# Patient Record
Sex: Female | Born: 1965
Health system: Southern US, Community
[De-identification: ages and names within clinical notes are randomized; demographics above are authoritative.]

## PROBLEM LIST (undated history)

## (undated) DIAGNOSIS — IMO0001 Reserved for inherently not codable concepts without codable children: Secondary | ICD-10-CM

## (undated) DIAGNOSIS — M1990 Unspecified osteoarthritis, unspecified site: Secondary | ICD-10-CM

## (undated) DIAGNOSIS — E785 Hyperlipidemia, unspecified: Secondary | ICD-10-CM

## (undated) DIAGNOSIS — F32A Depression, unspecified: Secondary | ICD-10-CM

## (undated) DIAGNOSIS — G510 Bell's palsy: Secondary | ICD-10-CM

## (undated) DIAGNOSIS — T7840XA Allergy, unspecified, initial encounter: Secondary | ICD-10-CM

## (undated) DIAGNOSIS — Z5189 Encounter for other specified aftercare: Secondary | ICD-10-CM

## (undated) DIAGNOSIS — D649 Anemia, unspecified: Secondary | ICD-10-CM

## (undated) DIAGNOSIS — D509 Iron deficiency anemia, unspecified: Secondary | ICD-10-CM

## (undated) DIAGNOSIS — F419 Anxiety disorder, unspecified: Secondary | ICD-10-CM

## (undated) DIAGNOSIS — F329 Major depressive disorder, single episode, unspecified: Secondary | ICD-10-CM

## (undated) DIAGNOSIS — G500 Trigeminal neuralgia: Secondary | ICD-10-CM

## (undated) DIAGNOSIS — K909 Intestinal malabsorption, unspecified: Secondary | ICD-10-CM

## (undated) DIAGNOSIS — R51 Headache: Secondary | ICD-10-CM

## (undated) HISTORY — DX: Headache: R51

## (undated) HISTORY — DX: Unspecified osteoarthritis, unspecified site: M19.90

## (undated) HISTORY — DX: Depression, unspecified: F32.A

## (undated) HISTORY — PX: NASAL SINUS SURGERY: SHX719

## (undated) HISTORY — DX: Bell's palsy: G51.0

## (undated) HISTORY — PX: ESOPHAGOGASTRODUODENOSCOPY: SHX1529

## (undated) HISTORY — PX: MANDIBLE SURGERY: SHX707

## (undated) HISTORY — DX: Trigeminal neuralgia: G50.0

## (undated) HISTORY — DX: Allergy, unspecified, initial encounter: T78.40XA

## (undated) HISTORY — DX: Reserved for inherently not codable concepts without codable children: IMO0001

## (undated) HISTORY — DX: Hyperlipidemia, unspecified: E78.5

## (undated) HISTORY — PX: COLONOSCOPY: SHX174

## (undated) HISTORY — DX: Anemia, unspecified: D64.9

## (undated) HISTORY — DX: Iron deficiency anemia, unspecified: D50.9

## (undated) HISTORY — DX: Anxiety disorder, unspecified: F41.9

## (undated) HISTORY — DX: Intestinal malabsorption, unspecified: K90.9

## (undated) HISTORY — PX: OTHER SURGICAL HISTORY: SHX169

## (undated) HISTORY — PX: OSTEOTOMY: SHX137

## (undated) HISTORY — DX: Major depressive disorder, single episode, unspecified: F32.9

## (undated) HISTORY — DX: Encounter for other specified aftercare: Z51.89

---

## 2000-12-31 DIAGNOSIS — G500 Trigeminal neuralgia: Secondary | ICD-10-CM

## 2000-12-31 HISTORY — DX: Trigeminal neuralgia: G50.0

## 2008-10-07 ENCOUNTER — Emergency Department (HOSPITAL_BASED_OUTPATIENT_CLINIC_OR_DEPARTMENT_OTHER): Admission: EM | Admit: 2008-10-07 | Discharge: 2008-10-07 | Payer: Self-pay | Admitting: Emergency Medicine

## 2008-10-11 ENCOUNTER — Encounter: Payer: Self-pay | Admitting: Family Medicine

## 2008-10-21 ENCOUNTER — Encounter: Payer: Self-pay | Admitting: Family Medicine

## 2009-07-05 ENCOUNTER — Encounter: Payer: Self-pay | Admitting: Family Medicine

## 2009-07-05 ENCOUNTER — Ambulatory Visit: Payer: Self-pay | Admitting: Family Medicine

## 2009-07-05 ENCOUNTER — Other Ambulatory Visit: Admission: RE | Admit: 2009-07-05 | Discharge: 2009-07-05 | Payer: Self-pay | Admitting: *Deleted

## 2009-07-05 DIAGNOSIS — R51 Headache: Secondary | ICD-10-CM | POA: Insufficient documentation

## 2009-07-05 DIAGNOSIS — R519 Headache, unspecified: Secondary | ICD-10-CM | POA: Insufficient documentation

## 2009-07-05 DIAGNOSIS — E785 Hyperlipidemia, unspecified: Secondary | ICD-10-CM | POA: Insufficient documentation

## 2009-07-05 DIAGNOSIS — M1990 Unspecified osteoarthritis, unspecified site: Secondary | ICD-10-CM | POA: Insufficient documentation

## 2009-07-05 DIAGNOSIS — F32A Depression, unspecified: Secondary | ICD-10-CM | POA: Insufficient documentation

## 2009-07-05 DIAGNOSIS — F329 Major depressive disorder, single episode, unspecified: Secondary | ICD-10-CM

## 2009-07-05 DIAGNOSIS — J309 Allergic rhinitis, unspecified: Secondary | ICD-10-CM | POA: Insufficient documentation

## 2009-07-05 LAB — CONVERTED CEMR LAB: Pap Smear: NORMAL

## 2009-08-04 ENCOUNTER — Telehealth: Payer: Self-pay | Admitting: Family Medicine

## 2009-08-09 ENCOUNTER — Ambulatory Visit: Payer: Self-pay | Admitting: Family Medicine

## 2009-08-09 DIAGNOSIS — G4710 Hypersomnia, unspecified: Secondary | ICD-10-CM | POA: Insufficient documentation

## 2009-08-22 ENCOUNTER — Telehealth (INDEPENDENT_AMBULATORY_CARE_PROVIDER_SITE_OTHER): Payer: Self-pay | Admitting: *Deleted

## 2009-11-08 ENCOUNTER — Ambulatory Visit: Payer: Self-pay | Admitting: Family Medicine

## 2009-11-14 DIAGNOSIS — N259 Disorder resulting from impaired renal tubular function, unspecified: Secondary | ICD-10-CM | POA: Insufficient documentation

## 2009-11-14 LAB — CONVERTED CEMR LAB
ALT: 9 units/L (ref 0–35)
AST: 16 units/L (ref 0–37)
Albumin: 4.1 g/dL (ref 3.5–5.2)
Alkaline Phosphatase: 51 units/L (ref 39–117)
CO2: 17 meq/L — ABNORMAL LOW (ref 19–32)
Cholesterol: 187 mg/dL (ref 0–200)
Total Bilirubin: 0.3 mg/dL (ref 0.3–1.2)
Total CHOL/HDL Ratio: 2.7

## 2009-11-22 ENCOUNTER — Encounter: Payer: Self-pay | Admitting: Family Medicine

## 2009-12-01 LAB — CONVERTED CEMR LAB
Calcium: 9.6 mg/dL (ref 8.4–10.5)
Chloride: 110 meq/L (ref 96–112)
Creatinine, Ser: 1.55 mg/dL — ABNORMAL HIGH (ref 0.40–1.20)
Glucose, Bld: 83 mg/dL (ref 70–99)
Potassium: 4.2 meq/L (ref 3.5–5.3)

## 2009-12-05 ENCOUNTER — Telehealth: Payer: Self-pay | Admitting: Family Medicine

## 2009-12-21 ENCOUNTER — Telehealth (INDEPENDENT_AMBULATORY_CARE_PROVIDER_SITE_OTHER): Payer: Self-pay | Admitting: *Deleted

## 2009-12-26 ENCOUNTER — Telehealth: Payer: Self-pay | Admitting: Family Medicine

## 2009-12-29 ENCOUNTER — Ambulatory Visit: Payer: Self-pay | Admitting: Family Medicine

## 2009-12-29 LAB — CONVERTED CEMR LAB
Blood in Urine, dipstick: NEGATIVE
Glucose, Urine, Semiquant: NEGATIVE
Nitrite: NEGATIVE
Rapid Strep: NEGATIVE
Specific Gravity, Urine: >=1.03
pH: 5.5

## 2009-12-30 ENCOUNTER — Encounter: Payer: Self-pay | Admitting: Family Medicine

## 2010-01-02 LAB — CONVERTED CEMR LAB
Trich, Wet Prep: NONE SEEN
Yeast Wet Prep HPF POC: NONE SEEN

## 2010-01-04 ENCOUNTER — Telehealth: Payer: Self-pay | Admitting: Family Medicine

## 2010-01-05 ENCOUNTER — Encounter: Payer: Self-pay | Admitting: Family Medicine

## 2010-01-05 ENCOUNTER — Encounter: Admission: RE | Admit: 2010-01-05 | Discharge: 2010-01-05 | Payer: Self-pay | Admitting: Family Medicine

## 2010-01-06 LAB — CONVERTED CEMR LAB
Alkaline Phosphatase: 54 units/L (ref 39–117)
BUN: 19 mg/dL (ref 6–23)
Basophils Absolute: 0 10*3/uL (ref 0.0–0.1)
Basophils Relative: 0 % (ref 0–1)
CO2: 19 meq/L (ref 19–32)
Calcium: 8.8 mg/dL (ref 8.4–10.5)
Eosinophils Absolute: 0.3 10*3/uL (ref 0.0–0.7)
HCT: 43.7 % (ref 36.0–46.0)
Lymphs Abs: 2.2 10*3/uL (ref 0.7–4.0)
Monocytes Absolute: 0.8 10*3/uL (ref 0.1–1.0)
Platelets: 193 10*3/uL (ref 150–400)
Potassium: 4.3 meq/L (ref 3.5–5.3)
RBC: 5.27 M/uL — ABNORMAL HIGH (ref 3.87–5.11)
Sodium: 141 meq/L (ref 135–145)
Total Bilirubin: 0.3 mg/dL (ref 0.3–1.2)
Total Protein: 7 g/dL (ref 6.0–8.3)
WBC: 8.6 10*3/uL (ref 4.0–10.5)

## 2010-01-12 ENCOUNTER — Encounter: Payer: Self-pay | Admitting: Family Medicine

## 2010-01-17 ENCOUNTER — Ambulatory Visit: Payer: Self-pay | Admitting: Diagnostic Radiology

## 2010-01-17 ENCOUNTER — Ambulatory Visit (HOSPITAL_BASED_OUTPATIENT_CLINIC_OR_DEPARTMENT_OTHER): Admission: RE | Admit: 2010-01-17 | Discharge: 2010-01-17 | Payer: Self-pay | Admitting: Nephrology

## 2010-01-27 ENCOUNTER — Encounter: Payer: Self-pay | Admitting: Family Medicine

## 2010-02-15 ENCOUNTER — Ambulatory Visit (HOSPITAL_COMMUNITY): Payer: Self-pay | Admitting: Psychiatry

## 2010-02-23 ENCOUNTER — Encounter: Payer: Self-pay | Admitting: Family Medicine

## 2010-04-10 ENCOUNTER — Ambulatory Visit (HOSPITAL_COMMUNITY): Payer: Self-pay | Admitting: Psychiatry

## 2010-05-29 ENCOUNTER — Ambulatory Visit: Payer: Self-pay | Admitting: Family Medicine

## 2010-07-25 ENCOUNTER — Ambulatory Visit: Payer: Self-pay | Admitting: Family Medicine

## 2010-07-25 DIAGNOSIS — N183 Chronic kidney disease, stage 3 unspecified: Secondary | ICD-10-CM | POA: Insufficient documentation

## 2010-08-10 ENCOUNTER — Other Ambulatory Visit: Admission: RE | Admit: 2010-08-10 | Discharge: 2010-08-10 | Payer: Self-pay | Admitting: Family Medicine

## 2010-08-10 ENCOUNTER — Ambulatory Visit: Payer: Self-pay | Admitting: Family Medicine

## 2010-08-10 ENCOUNTER — Encounter: Admission: RE | Admit: 2010-08-10 | Discharge: 2010-08-10 | Payer: Self-pay | Admitting: Family Medicine

## 2010-08-10 DIAGNOSIS — R5383 Other fatigue: Secondary | ICD-10-CM

## 2010-08-10 DIAGNOSIS — R5381 Other malaise: Secondary | ICD-10-CM | POA: Insufficient documentation

## 2010-08-10 LAB — CONVERTED CEMR LAB: Pap Smear: NORMAL

## 2010-08-11 LAB — CONVERTED CEMR LAB
CO2: 20 meq/L (ref 19–32)
Chloride: 108 meq/L (ref 96–112)
Creatinine, Ser: 1.36 mg/dL — ABNORMAL HIGH (ref 0.40–1.20)
Glucose, Bld: 91 mg/dL (ref 70–99)
Iron: 52 ug/dL (ref 42–145)
Potassium: 4.5 meq/L (ref 3.5–5.3)
Sodium: 140 meq/L (ref 135–145)
TSH: 3.17 microintl units/mL (ref 0.350–4.500)

## 2010-08-15 LAB — CONVERTED CEMR LAB: Pap Smear: NEGATIVE

## 2010-08-29 ENCOUNTER — Encounter: Payer: Self-pay | Admitting: Family Medicine

## 2010-09-01 ENCOUNTER — Encounter: Payer: Self-pay | Admitting: Family Medicine

## 2010-09-05 ENCOUNTER — Encounter: Payer: Self-pay | Admitting: Family Medicine

## 2010-09-06 ENCOUNTER — Encounter: Payer: Self-pay | Admitting: Family Medicine

## 2010-09-06 ENCOUNTER — Telehealth: Payer: Self-pay | Admitting: Family Medicine

## 2010-09-26 ENCOUNTER — Encounter: Payer: Self-pay | Admitting: Family Medicine

## 2010-10-23 ENCOUNTER — Telehealth: Payer: Self-pay | Admitting: Family Medicine

## 2011-01-30 NOTE — Consult Note (Signed)
Summary: [facility]  [facility]   Imported By: Lanelle Bal 02/09/2010 12:54:16  _____________________________________________________________________  External Attachment:    Type:   Image     Comment:   External Document

## 2011-01-30 NOTE — Progress Notes (Signed)
Summary: Letter for insurance   Phone Note Call from Patient   Caller: Patient Summary of Call: Pt states she would like for you to write a letter to her insurance company stating she has a disability but still needs to work out and lose weight. Pt states she has already contacted her insurance company and they will cover a gym membership if she has a letter from her doctor.  Initial call taken by: Payton Spark CMA,  September 06, 2010 8:28 AM  Follow-up for Phone Call        sure thing. Follow-up by: Seymour Bars DO,  September 06, 2010 8:35 AM

## 2011-01-30 NOTE — Assessment & Plan Note (Signed)
Summary: sinusitis   Vital Signs:  Patient profile:   45 year old female Menstrual status:  regular Height:      64 inches Weight:      160 pounds BMI:     27.56 O2 Sat:      100 % on Room air Pulse rate:   116 / minute BP sitting:   119 / 80  (left arm) Cuff size:   regular  Vitals Entered By: Payton Spark CMA (July 25, 2010 9:12 AM)  O2 Flow:  Room air CC: f/u.   Primary Care Provider:  Seymour Bars DO  CC:  f/u.Marland Kitchen  History of Present Illness: 45 yo WF presents for  problems with sinus pressure and change in HAs x 3 wks.  Feels differnet than her 'usual' trigeminal neuralgia and migraine pain.  Hx of allergies.  Has felt congested x 3 wks with sinus pressure.  Has tried Benadryl, Sudafed, chlorpheniramine.  Nothing is helping.  Had an ear infection, treated in May.  Denies ear pain but has fullness now.  Has malaise but no fevers or chills.    She is seeing Dr Allena Katz at [facility] for stage III CKD. She is seeing Dr Samuel Bouche for pain mgmt.  On Ketamine infusions.    Current Medications (verified): 1)  Tramadol Hcl 50 Mg  Tabs (Tramadol Hcl) .... Four Times Daily 2)  Trazodone Hcl 100 Mg Tabs (Trazodone Hcl) .Marland Kitchen.. 1-2 By Mouth At Bedtime 3)  Topiramate 100 Mg Tabs (Topiramate) .... Take 3 Tabs Once Daily 4)  Verapamil Hcl Cr 120 Mg Cr-Tabs (Verapamil Hcl) .Marland Kitchen.. 1 By Mouth Once Daily 5)  Clonazepam 1 Mg Tabs (Clonazepam) .Marland Kitchen.. 1 By Mouth Two Times A Day 6)  Naproxen Sodium 220 Mg Tabs (Naproxen Sodium) .Marland Kitchen.. 1 By Mouth Once Daily 7)  Tizanidine Hcl 4 Mg Tabs (Tizanidine Hcl) .Marland Kitchen.. 1 By Mouth Four Times Daily 8)  Vimpat 100 Mg Tabs (Lacosamide) .Marland Kitchen.. 1 By Mouth Three Times A Day 9)  Zoloft 100 Mg Tabs (Sertraline Hcl) .... 1.5 Tabs By Mouth Daily 10)  Yaz 3-0.02 Mg Tabs (Drospirenone-Ethinyl Estradiol) .Marland Kitchen.. 1 By Mouth Once Daily 11)  Acetaminophen 325 Mg Tabs (Acetaminophen) .... As Needed 12)  Frova 2.5 Mg Tabs (Frovatriptan Succinate) .... Once Daily 13)  Crestor 10 Mg Tabs  (Rosuvastatin Calcium) .Marland Kitchen.. 1 Tab By Mouth Qhs 14)  Abilify 2 Mg Tabs (Aripiprazole) .Marland Kitchen.. 1 Tab By Mouth Q Pm 15)  Nasonex 50 Mcg/act Susp (Mometasone Furoate) .... 2 Sprays/ Nostril Daily  Allergies (verified): 1)  ! Methadone Hcl (Methadone Hcl) 2)  ! Phenobarbital (Phenobarbital) 3)  ! Azithromycin (Azithromycin)  Past History:  Past Medical History: Allergic rhinitis Blood transfusion Depression Headache Hyperlipidemia Osteoarthritis R facial paralysis/ chronic pain syndrome.  (pain managment Dr Kendell Bane) G0 R trigeminal neuralgia.    Past Surgical History: Reviewed history from 07/05/2009 and no changes required. osteotomy  Social History: Reviewed history from 07/05/2009 and no changes required. Lives alone. parents nearby no relationships on disability denies smoking or ETOH  Review of Systems      See HPI  Physical Exam  General:  alert, well-developed, well-nourished, and well-hydrated.   Head:  normocephalic and atraumatic.  frontal sinus TTP Eyes:  conjunctiva clear Ears:  EACs patent; TMs translucent and gray with good cone of light and bony landmarks.  Nose:  septal deviation with scant clear rhinorrhea Mouth:  pharynx pink and moist.   Neck:  no masses.   Lungs:  Normal respiratory  effort, chest expands symmetrically. Lungs are clear to auscultation, no crackles or wheezes. Heart:  regular rhythm, no murmur, and tachycardia.   Skin:  color normal and no rashes.   Cervical Nodes:  No lymphadenopathy noted   Impression & Recommendations:  Problem # 1:  ACUTE SINUSITIS, UNSPECIFIED (ICD-461.9) Will treat her with Amoxicillin x 10 days for sinusitis + sample of Astepro spray for underlying allergies. If not improved in 10 days, will get a limited CT of the sinuses to r/o chronic sinusitis.  Likely will need allergy referral in the future for testing. The following medications were removed from the medication list:    Amoxicillin 875 Mg Tabs  (Amoxicillin) ..... One by mouth two times a day Her updated medication list for this problem includes:    Nasonex 50 Mcg/act Susp (Mometasone furoate) .Marland Kitchen... 2 sprays/ nostril daily    Amoxicillin 875 Mg Tabs (Amoxicillin) .Marland Kitchen... 1 tab by mouth q 12 hrs x 10 days  Problem # 2:  CHRONIC KIDNEY DISEASE STAGE III (MODERATE) (ICD-585.3) Reviewed notes from Dr Allena Katz.  Update labs and fax copy to Dr Allena Katz.  She is due to f/u in Aug.   Orders: T-CBC w/Diff [id number] [phone]) T-Comprehensive Metabolic Panel [phone])  Complete Medication List: 1)  Tramadol Hcl 50 Mg Tabs (Tramadol hcl) .... Four times daily 2)  Trazodone Hcl 100 Mg Tabs (Trazodone hcl) .Marland Kitchen.. 1-2 by mouth at bedtime 3)  Topiramate 100 Mg Tabs (Topiramate) .... Take 3 tabs once daily 4)  Verapamil Hcl Cr 120 Mg Cr-tabs (Verapamil hcl) .Marland Kitchen.. 1 by mouth once daily 5)  Clonazepam 1 Mg Tabs (Clonazepam) .Marland Kitchen.. 1 by mouth two times a day 6)  Naproxen Sodium 220 Mg Tabs (Naproxen sodium) .Marland Kitchen.. 1 by mouth once daily 7)  Tizanidine Hcl 4 Mg Tabs (Tizanidine hcl) .Marland Kitchen.. 1 by mouth four times daily 8)  Vimpat 100 Mg Tabs (Lacosamide) .Marland Kitchen.. 1 by mouth three times a day 9)  Zoloft 100 Mg Tabs (Sertraline hcl) .... 1.5 tabs by mouth daily 10)  Yaz 3-0.02 Mg Tabs (Drospirenone-ethinyl estradiol) .Marland Kitchen.. 1 by mouth once daily 11)  Acetaminophen 325 Mg Tabs (Acetaminophen) .... As needed 12)  Frova 2.5 Mg Tabs (Frovatriptan succinate) .... Once daily 13)  Crestor 10 Mg Tabs (Rosuvastatin calcium) .Marland Kitchen.. 1 tab by mouth qhs 14)  Abilify 2 Mg Tabs (Aripiprazole) .Marland Kitchen.. 1 tab by mouth q pm 15)  Nasonex 50 Mcg/act Susp (Mometasone furoate) .... 2 sprays/ nostril daily 16)  Amoxicillin 875 Mg Tabs (Amoxicillin) .Marland Kitchen.. 1 tab by mouth q 12 hrs x 10 days  Other Orders: T-LDL Direct ([id number])  Patient Instructions: 1)  Treat sinusitis with 10 days of Amoxicillin (take with breakfast and dinner). 2)  Use RX Astepro sample - 2 sprays per nostril twice a day. 3)   Avoid OTC 'sinus meds'. 4)  Use Tylenol as needed for headache pain. 5)  Update labs today. 6)  Will call you w/ results tomorrow. 7)  Call if not improved after 10 days --> next step will be sinus CT scan.   Prescriptions: AMOXICILLIN 875 MG TABS (AMOXICILLIN) 1 tab by mouth q 12 hrs x 10 days  #20 x 0   Entered and Authorized by:   Seymour Bars DO   Signed by:   Seymour Bars DO on 07/25/2010   Method used:   Electronically to        [facility] (retail)       [address]  [address]       [place], [place]  [address]       Ph: [phone] or [phone]       Fax: [phone]   RxID:   [id number] ABILIFY 2 MG TABS (ARIPIPRAZOLE) 1 tab by mouth q PM  #30 Tablet x 6   Entered and Authorized by:   Seymour Bars DO   Signed by:   Seymour Bars DO on 07/25/2010   Method used:   Electronically to        [facility] (retail)       [address]       [place], [place]  [address]       Ph: [phone] or [phone]       Fax: [phone]   RxID:   [id number] CRESTOR 10 MG TABS (ROSUVASTATIN CALCIUM) 1 tab by mouth qhs  #90 x 1   Entered and Authorized by:   Seymour Bars DO   Signed by:   Seymour Bars DO on 07/25/2010   Method used:   Printed then faxed to ...       [facility] [address] (retail)       [address]       [place], [place]  [address]       Ph: [phone] or [phone]       Fax: [phone]   RxID:   [id number] ZOLOFT 100 MG TABS (SERTRALINE HCL) 1.5 tabs by mouth daily  #135 x 1   Entered and Authorized by:   Seymour Bars DO   Signed by:   Seymour Bars DO on 07/25/2010   Method used:   Printed then faxed to ...       [facility] [address] (retail)       [address]       [place], [place]  [address]       Ph: [phone] or [phone]       Fax: [phone]   RxID:   [id number]

## 2011-01-30 NOTE — Medication Information (Signed)
Summary: Formulary Letter Regarding Abilify & Gianvi/Silver Script  Formulary Letter Regarding Abilify & Gianvi/Silver Script   Imported By: Lanelle Bal 02/17/2010 10:12:26  _____________________________________________________________________  External Attachment:    Type:   Image     Comment:   External Document

## 2011-01-30 NOTE — Letter (Signed)
Summary: [facility]  [facility]   Imported By: Lanelle Bal 03/07/2010 13:23:56  _____________________________________________________________________  External Attachment:    Type:   Image     Comment:   External Document

## 2011-01-30 NOTE — Consult Note (Signed)
Summary: Eli Hose MD  Zoe Walker Kehr MD   Imported By: Sherian Rein 09/16/2010 11:12:34  _____________________________________________________________________  External Attachment:    Type:   Image     Comment:   External Document

## 2011-01-30 NOTE — Letter (Signed)
Summary: [facility]  [facility]   Imported By: Lanelle Bal 10/05/2010 09:41:19  _____________________________________________________________________  External Attachment:    Type:   Image     Comment:   External Document

## 2011-01-30 NOTE — Assessment & Plan Note (Signed)
Summary: L ear pain x 1 dy rm 3   Vital Signs:  Patient Profile:   45 Years Old Female CC:      L Ear pain x 1 dy Height:     64 inches Weight:      163 pounds O2 Sat:      100 % O2 treatment:    Room Air Temp:     97.4 degrees F oral Pulse rate:   104 / minute Pulse rhythm:   regular Resp:     18 per minute BP sitting:   118 / 89  (right arm) Cuff size:   regular  Vitals Entered By: Areta Haber CMA (May 29, 2010 1:32 PM)                  Current Allergies: ! METHADONE HCL (METHADONE HCL) ! PHENOBARBITAL (PHENOBARBITAL) ! AZITHROMYCIN (AZITHROMYCIN)     History of Present Illness Chief Complaint: L Ear pain x 1 dy History of Present Illness: Subjective:  Patient complains of onset of left ear pain yesterday.  She has chronic right ear and head pain, and left ear pain is completely different.  She is concerned that she may have an ear ifection.  She has had increased sinus congestion for about 2 weeks.  No fevers, chills, and sweats   Current Problems: LOM (ICD-382.9) ABDOMINAL PAIN, GENERALIZED (ICD-789.07) VIRAL EXANTHEM, ACUTE (ICD-057.9) VAGINITIS (ICD-616.10) RENAL INSUFFICIENCY (ICD-588.9) OTHER SCREENING MAMMOGRAM (ICD-V76.12) OTH&UNSPEC ENDOCRN NUTRIT METAB&IMMUNITY D/O (ICD-V77.99) HYPERSOMNIA (ICD-780.54) ACUTE MAXILLARY SINUSITIS (ICD-461.0) ROUTINE GYNECOLOGICAL EXAMINATION (ICD-V72.31) OSTEOARTHRITIS (ICD-715.90) HYPERLIPIDEMIA (ICD-272.4) HEADACHE (ICD-784.0) DEPRESSION (ICD-311) ALLERGIC RHINITIS (ICD-477.9)   Current Meds TRAMADOL HCL 50 MG  TABS (TRAMADOL HCL) four times daily TRAZODONE HCL 100 MG TABS (TRAZODONE HCL) 1-2 by mouth at bedtime TOPIRAMATE 100 MG TABS (TOPIRAMATE) take 3 tabs once daily VERAPAMIL HCL CR 120 MG CR-TABS (VERAPAMIL HCL) 1 by mouth once daily CLONAZEPAM 1 MG TABS (CLONAZEPAM) 1 by mouth two times a day NAPROXEN SODIUM 220 MG TABS (NAPROXEN SODIUM) 1 by mouth once daily TIZANIDINE HCL 4 MG TABS (TIZANIDINE  HCL) 1 by mouth four times daily VIMPAT 100 MG TABS (LACOSAMIDE) 1 by mouth three times a day ZOLOFT 100 MG TABS (SERTRALINE HCL) 1.5 tabs by mouth daily YAZ 3-0.02 MG TABS (DROSPIRENONE-ETHINYL ESTRADIOL) 1 by mouth once daily ACETAMINOPHEN 325 MG TABS (ACETAMINOPHEN) as needed FROVA 2.5 MG TABS (FROVATRIPTAN SUCCINATE) once daily CRESTOR 10 MG TABS (ROSUVASTATIN CALCIUM) 1 tab by mouth qhs ABILIFY 2 MG TABS (ARIPIPRAZOLE) 1 tab by mouth q PM NASONEX 50 MCG/ACT SUSP (MOMETASONE FUROATE) 2 sprays/ nostril daily NYSTATIN-TRIAMCINOLONE 100000-0.1 UNIT/GM-% OINT (NYSTATIN-TRIAMCINOLONE) apply to external genitalia two times a day x 10 days AMOXICILLIN 875 MG TABS (AMOXICILLIN) One by mouth two times a day ANTIPYRINE-BENZOCAINE 5.4-1.4 % SOLN (BENZOCAINE-ANTIPYRINE) 2 to 4 gtts in affected ear three times a day for 2 to 3 days  REVIEW OF SYSTEMS Constitutional Symptoms      Denies fever, chills, night sweats, weight loss, weight gain, and fatigue.  Eyes       Denies change in vision, eye pain, eye discharge, glasses, contact lenses, and eye surgery. Ear/Nose/Throat/Mouth       Complains of change in hearing and ear pain.      Denies hearing loss/aids, ear discharge, dizziness, frequent runny nose, frequent nose bleeds, sinus problems, sore throat, hoarseness, and tooth pain or bleeding.      Comments: x 1 dy Respiratory       Denies dry cough, productive  cough, wheezing, shortness of breath, asthma, bronchitis, and emphysema/COPD.  Cardiovascular       Denies murmurs, chest pain, and tires easily with exhertion.    Gastrointestinal       Denies stomach pain, nausea/vomiting, diarrhea, constipation, blood in bowel movements, and indigestion. Genitourniary       Denies painful urination, kidney stones, and loss of urinary control. Neurological       Denies paralysis, seizures, and fainting/blackouts. Musculoskeletal       Denies muscle pain, joint pain, joint stiffness, decreased range of  motion, redness, swelling, muscle weakness, and gout.  Skin       Denies bruising, unusual mles/lumps or sores, and hair/skin or nail changes.  Psych       Denies mood changes, temper/anger issues, anxiety/stress, speech problems, depression, and sleep problems. Other Comments: Pt has not seen PCP for this.   Past History:  Past Medical History: Last updated: 07/05/2009 Allergic rhinitis Blood transfusion Depression Headache Hyperlipidemia Osteoarthritis R facial paralysis/ chronic pain syndrome.  (pain managment Dr Kendell Bane) G0  Past Surgical History: Last updated: 07/05/2009 osteotomy  Family History: Last updated: 07/05/2009 father: healthy Mother: healthy sister healthy  Social History: Last updated: 07/05/2009 Lives alone. parents nearby no relationships on disability denies smoking or ETOH   Objective:  No acute distress  Eyes:  Pupils are reactive Ears:  Canals normal.  Right tympanic membrane normal.  Left tympanic membrane red and bulging. Nose:   Septum deviates left.  There is increased turbinate congestion.  Left maxillary sinus tenderness present. Pharynx:  Normal  Neck:  Supple.  No adenopathy is present.   Assessment New Problems: LOM (ICD-382.9)   Plan New Medications/Changes: ANTIPYRINE-BENZOCAINE 5.4-1.4 % SOLN (BENZOCAINE-ANTIPYRINE) 2 to 4 gtts in affected ear three times a day for 2 to 3 days  #10cc x 0, 05/29/2010, Donna Christen MD AMOXICILLIN 875 MG TABS (AMOXICILLIN) One by mouth two times a day  #20 x 0, 05/29/2010, Donna Christen MD  New Orders: New Patient Level III [phone] Planning Comments:   Instilled Auralgan drops left ear.  Given Rx for Auralgan:  use for 2 to 4 days. Begin amoxicillin for 10 days.   May use topical decongestant and saline nasal spray. Follow-up with PCP if not improving.   The patient and/or caregiver has been counseled thoroughly with regard to medications prescribed including dosage, schedule,  interactions, rationale for use, and possible side effects and they verbalize understanding.  Diagnoses and expected course of recovery discussed and will return if not improved as expected or if the condition worsens. Patient and/or caregiver verbalized understanding.  Prescriptions: ANTIPYRINE-BENZOCAINE 5.4-1.4 % SOLN (BENZOCAINE-ANTIPYRINE) 2 to 4 gtts in affected ear three times a day for 2 to 3 days  #10cc x 0   Entered and Authorized by:   Donna Christen MD   Signed by:   Donna Christen MD on 05/29/2010   Method used:   Print then Give to Patient   RxID:   [id number] AMOXICILLIN 875 MG TABS (AMOXICILLIN) One by mouth two times a day  #20 x 0   Entered and Authorized by:   Donna Christen MD   Signed by:   Donna Christen MD on 05/29/2010   Method used:   Print then Give to Patient   RxID:   [id number]    Orders Added: 1)  New Patient Level III [43[address]] Administered four drops in left ear. Pt. tolerated well. Antipyrine and Benzocaine Otic Solution, Lot # [id number], exp., 2/12. Joanne Chars  CMA  May 29, 2010 3:07 PM

## 2011-01-30 NOTE — Consult Note (Signed)
Summary: [facility]  [facility]   Imported By: Lanelle Bal 09/07/2010 09:21:04  _____________________________________________________________________  External Attachment:    Type:   Image     Comment:   External Document

## 2011-01-30 NOTE — Progress Notes (Signed)
Summary: Abd pain  Phone Note Call from Patient   Caller: Patient Summary of Call: Pt states her abd pain is getting worse and radiates to neck and down leg. She states it is a cramping pain. What would you like Pt to do?  Please advise.  Initial call taken by: Payton Spark CMA,  January 04, 2010 1:52 PM  Follow-up for Phone Call        lets get some labs and an Acute abdominal series of xrays.  Have her f/u with me on Friday. Follow-up by: Seymour Bars DO,  January 04, 2010 2:08 PM  New Problems: ABDOMINAL PAIN, GENERALIZED (ICD-789.07)   New Problems: ABDOMINAL PAIN, GENERALIZED (ICD-789.07)  Appended Document: Abd pain Pt aware. Pt will come tomorrow.

## 2011-01-30 NOTE — Progress Notes (Signed)
Summary: Records  Phone Note Call from Patient Call back at Fax # [phone]   Caller: Desiree Wilson/ [employer] Call For: Desiree Bars DO Summary of Call: Wrote letter of medical necessity and this was denied by her insurance and they need infor regarding to her needing a gym membership for medical reasons. They need records showing medical conditions that would warrant them paying for the membership. Call back number  [phone] Initial call taken by: Kathlene November LPN,  October 23, 2010 11:11 AM  Follow-up for Phone Call        I did already.  medical conditions were listed on the letter.  I don't have anything else to add. Follow-up by: Desiree Bars DO,  October 23, 2010 11:14 AM     Appended Document: Records Notified rep of MD instructions. KJ LPN

## 2011-01-30 NOTE — Letter (Signed)
[facility]   Imported By: Lanelle Bal 09/25/2010 12:00:10  _____________________________________________________________________  External Attachment:    Type:   Image     Comment:   External Document

## 2011-01-30 NOTE — Letter (Signed)
Summary: Generic Letter  [facility] [place]  [address], [address]   [place], [place] [address]   Phone: [phone]  Fax: [phone]    09/06/2010  SYRAI GLADWIN [address] [place], [place]  [address]  To Whom It May Concern,  Ms. Diani Jillson is a primary care patient of mine with chronic stable medical problems that have lead to disability.  It is medically recommended that she begin an exercise program due to hyperlipidemia, being overweight (BMI 28) and osteoarthritis.      Sincerely,    Seymour Bars DO

## 2011-01-30 NOTE — Assessment & Plan Note (Signed)
Summary: CPE with pap   Vital Signs:  Patient profile:   45 year old female Menstrual status:  regular Height:      64 inches Weight:      163 pounds BMI:     28.08 O2 Sat:      98 % on Room air Pulse rate:   92 / minute BP sitting:   108 / 75  (left arm) Cuff size:   regular  Vitals Entered By: Payton Spark CMA (August 10, 2010 8:28 AM)  O2 Flow:  Room air CC: CPE w/ pap   Primary Care Provider:  Seymour Bars DO  CC:  CPE w/ pap.  History of Present Illness: 45 yo WF presents for a CPE with pap smear.    She is nulligravid, premenopausal.  She is on YAZ for birth control.  She is not in any relationships.  She is still having head congestion and sinus pressure even after treatment withAmoxicillin.  She does not have an ENT or an allergist.  She feels very tired.  She had many facial reconstructive surgeries.  She did not have OSA when tested in the past.    She denies a fam hx of premature heart dz, colon or breast cancer.  Her mammogram is due.  Her tetanus vaccine is due.    Current Medications (verified): 1)  Tramadol Hcl 50 Mg  Tabs (Tramadol Hcl) .... Four Times Daily 2)  Trazodone Hcl 100 Mg Tabs (Trazodone Hcl) .Marland Kitchen.. 1-2 By Mouth At Bedtime 3)  Topiramate 100 Mg Tabs (Topiramate) .... Take 3 Tabs Once Daily 4)  Verapamil Hcl Cr 120 Mg Cr-Tabs (Verapamil Hcl) .Marland Kitchen.. 1 By Mouth Once Daily 5)  Clonazepam 1 Mg Tabs (Clonazepam) .Marland Kitchen.. 1 By Mouth Two Times A Day 6)  Naproxen Sodium 220 Mg Tabs (Naproxen Sodium) .Marland Kitchen.. 1 By Mouth Once Daily 7)  Tizanidine Hcl 4 Mg Tabs (Tizanidine Hcl) .Marland Kitchen.. 1 By Mouth Four Times Daily 8)  Vimpat 100 Mg Tabs (Lacosamide) .Marland Kitchen.. 1 By Mouth Three Times A Day 9)  Zoloft 100 Mg Tabs (Sertraline Hcl) .... 1.5 Tabs By Mouth Daily 10)  Yaz 3-0.02 Mg Tabs (Drospirenone-Ethinyl Estradiol) .Marland Kitchen.. 1 By Mouth Once Daily 11)  Acetaminophen 325 Mg Tabs (Acetaminophen) .... As Needed 12)  Frova 2.5 Mg Tabs (Frovatriptan Succinate) .... Once Daily 13)  Crestor  10 Mg Tabs (Rosuvastatin Calcium) .Marland Kitchen.. 1 Tab By Mouth Qhs 14)  Abilify 2 Mg Tabs (Aripiprazole) .Marland Kitchen.. 1 Tab By Mouth Q Pm 15)  Astepro 0.15 % Soln (Azelastine Hcl) .... 2 Sprays Per Nostril Two Times A Day As Needed 16)  Amoxicillin 875 Mg Tabs (Amoxicillin) .Marland Kitchen.. 1 Tab By Mouth Q 12 Hrs X 10 Days  Allergies (verified): 1)  ! Methadone Hcl (Methadone Hcl) 2)  ! Phenobarbital (Phenobarbital) 3)  ! Azithromycin (Azithromycin)  Past History:  Past Medical History: Reviewed history from 07/25/2010 and no changes required. Allergic rhinitis Blood transfusion Depression Headache Hyperlipidemia Osteoarthritis R facial paralysis/ chronic pain syndrome.  (pain managment Dr Kendell Bane) G0 R trigeminal neuralgia.    Past Surgical History: Reviewed history from 07/05/2009 and no changes required. osteotomy  Family History: Reviewed history from 07/05/2009 and no changes required. father: healthy Mother: healthy sister healthy  Social History: Reviewed history from 07/05/2009 and no changes required. Lives alone. parents nearby no relationships on disability denies smoking or ETOH  Review of Systems  The patient denies anorexia, fever, weight loss, weight gain, vision loss, decreased hearing, hoarseness, chest pain, syncope,  dyspnea on exertion, peripheral edema, prolonged cough, headaches, hemoptysis, abdominal pain, melena, hematochezia, severe indigestion/heartburn, hematuria, incontinence, genital sores, muscle weakness, suspicious skin lesions, transient blindness, difficulty walking, depression, unusual weight change, abnormal bleeding, enlarged lymph nodes, angioedema, breast masses, and testicular masses.    Physical Exam  General:  alert, well-developed, well-nourished, well-hydrated, and overweight-appearing.   Head:  postop changes to R temporal/ maxillary region with R maxillary TTP Eyes:  conjunctiva clear Ears:  EACs patent; TMs translucent and gray with good cone of  light and bony landmarks.  Nose:  no nasal discharge.  septal deviation Mouth:  pharynx pink and moist and fair dentition.   Neck:  no masses.   Breasts:  No mass, nodules, thickening, tenderness, bulging, retraction, inflamation, nipple discharge or skin changes noted.   Lungs:  Normal respiratory effort, chest expands symmetrically. Lungs are clear to auscultation, no crackles or wheezes. Heart:  Normal rate and regular rhythm. S1 and S2 normal without gallop, murmur, click, rub or other extra sounds. Abdomen:  soft, non-tender, normal bowel sounds, no distention, no masses, no guarding, no hepatomegaly, and no splenomegaly.   Genitalia:  Pelvic Exam:        External: normal female genitalia without lesions or masses        Vagina: normal without lesions or masses, white thick discharge        Cervix: normal without lesions or masses        Adnexa: normal bimanual exam without masses or fullness        Uterus: normal by palpation        Pap smear: performed Pulses:  2+ radial and pedal pulses Extremities:  no LE edema Skin:  color normal.   Cervical Nodes:  No lymphadenopathy noted Psych:  good eye contact, not anxious appearing, and not depressed appearing.     Impression & Recommendations:  Problem # 1:  ROUTINE GYNECOLOGICAL EXAMINATION (ICD-V72.31) Thin prep pap done. Keeping healthy checklist for women reviewed. BP at goal.  BMI in overwt range. Work on [employer], increased exercise, MVI daily, calcium with D daily. Update labs for continued c/o fatigue, CKD. Ordered mammogram. Tdap today. CT sinuses for continued sinus complaints. Derm referral made for fam hx of skin cancer.  Complete Medication List: 1)  Tramadol Hcl 50 Mg Tabs (Tramadol hcl) .... Four times daily 2)  Trazodone Hcl 100 Mg Tabs (Trazodone hcl) .Marland Kitchen.. 1-2 by mouth at bedtime 3)  Topiramate 100 Mg Tabs (Topiramate) .... Take 3 tabs once daily 4)  Verapamil Hcl Cr 120 Mg Cr-tabs (Verapamil hcl) .Marland Kitchen.. 1  by mouth once daily 5)  Clonazepam 1 Mg Tabs (Clonazepam) .Marland Kitchen.. 1 by mouth two times a day 6)  Naproxen Sodium 220 Mg Tabs (Naproxen sodium) .Marland Kitchen.. 1 by mouth once daily 7)  Tizanidine Hcl 4 Mg Tabs (Tizanidine hcl) .Marland Kitchen.. 1 by mouth four times daily 8)  Vimpat 100 Mg Tabs (Lacosamide) .Marland Kitchen.. 1 by mouth three times a day 9)  Zoloft 100 Mg Tabs (Sertraline hcl) .... 1.5 tabs by mouth daily 10)  Yaz 3-0.02 Mg Tabs (Drospirenone-ethinyl estradiol) .Marland Kitchen.. 1 by mouth once daily 11)  Acetaminophen 325 Mg Tabs (Acetaminophen) .... As needed 12)  Frova 2.5 Mg Tabs (Frovatriptan succinate) .... Once daily 13)  Crestor 10 Mg Tabs (Rosuvastatin calcium) .Marland Kitchen.. 1 tab by mouth qhs 14)  Abilify 2 Mg Tabs (Aripiprazole) .Marland Kitchen.. 1 tab by mouth q pm 15)  Astepro 0.15 % Soln (Azelastine hcl) .... 2 sprays per nostril two  times a day as needed 16)  Fluconazole 150 Mg Tabs (Fluconazole) .Marland Kitchen.. 1 tab by mouth x 1  Other Orders: T-TSH [id number]) T-Basic Metabolic Panel [phone]) T-Iron [phone]) T-Iron Binding Capacity (TIBC) ([id number]) T-Mammography Bilateral Screening (32355) Dermatology Referral (Derma) T-CT Maxillofacial Limited w/o [id number]) Tdap => 2yrs IM (25427) Admin 1st Vaccine (06237) Admin 1st Vaccine [facility]) [phone])  Patient Instructions: 1)  CT sinuses ordered. 2)  Will call you w/ results. 3)  Update your mammogram. 4)  Will get you into Dr Vernell Barrier for a skin check. 5)  Return for follow up in 6 mos, sooner if needed. Prescriptions: FLUCONAZOLE 150 MG TABS (FLUCONAZOLE) 1 tab by mouth x 1  #1 x 0   Entered and Authorized by:   Seymour Bars DO   Signed by:   Seymour Bars DO on 08/10/2010   Method used:   Electronically to        [facility] (retail)       [address]       [place], [place]  [address]       Ph: [phone] or [phone]       Fax: [phone]   RxID:   [id number] YAZ 3-0.02 MG TABS (DROSPIRENONE-ETHINYL ESTRADIOL) 1 by mouth  once daily  #28 Tablet x 12   Entered and Authorized by:   Seymour Bars DO   Signed by:   Seymour Bars DO on 08/10/2010   Method used:   Electronically to        [facility] (retail)       [address]       [place], [place]  [address]       Ph: [phone] or [phone]       Fax: [phone]   RxID:   [id number]    Tetanus/Td Vaccine    Vaccine Type: Tdap    Site: left deltoid    Dose: 0.5 ml    Route: IM    Given by: Payton Spark CMA    Exp. Date: 03/24/2012    Lot #: [id number]    VIS given: 11/18/07 version given August 10, 2010.

## 2011-02-06 ENCOUNTER — Emergency Department (INDEPENDENT_AMBULATORY_CARE_PROVIDER_SITE_OTHER): Payer: No Typology Code available for payment source

## 2011-02-06 ENCOUNTER — Emergency Department (HOSPITAL_BASED_OUTPATIENT_CLINIC_OR_DEPARTMENT_OTHER)
Admission: EM | Admit: 2011-02-06 | Discharge: 2011-02-06 | Disposition: A | Discharge status: Nursing Home (Includes ICF) | Payer: No Typology Code available for payment source | Source: Home / Self Care | Attending: Emergency Medicine | Admitting: Emergency Medicine

## 2011-02-06 ENCOUNTER — Inpatient Hospital Stay (HOSPITAL_COMMUNITY)
Admission: AD | Admit: 2011-02-06 | Discharge: 2011-02-07 | DRG: 074 | Disposition: A | Discharge status: Home / Self Care | Payer: No Typology Code available for payment source | Source: Other Acute Inpatient Hospital | Attending: Family Medicine | Admitting: Family Medicine

## 2011-02-06 DIAGNOSIS — M5382 Other specified dorsopathies, cervical region: Secondary | ICD-10-CM

## 2011-02-06 DIAGNOSIS — E872 Acidosis, unspecified: Secondary | ICD-10-CM | POA: Diagnosis present

## 2011-02-06 DIAGNOSIS — M79609 Pain in unspecified limb: Secondary | ICD-10-CM

## 2011-02-06 DIAGNOSIS — M542 Cervicalgia: Secondary | ICD-10-CM | POA: Diagnosis present

## 2011-02-06 DIAGNOSIS — G500 Trigeminal neuralgia: Secondary | ICD-10-CM | POA: Diagnosis present

## 2011-02-06 DIAGNOSIS — M5412 Radiculopathy, cervical region: Principal | ICD-10-CM | POA: Diagnosis present

## 2011-02-06 DIAGNOSIS — R209 Unspecified disturbances of skin sensation: Secondary | ICD-10-CM

## 2011-02-06 DIAGNOSIS — Z66 Do not resuscitate: Secondary | ICD-10-CM | POA: Diagnosis present

## 2011-02-06 DIAGNOSIS — D696 Thrombocytopenia, unspecified: Secondary | ICD-10-CM | POA: Diagnosis present

## 2011-02-06 DIAGNOSIS — R5381 Other malaise: Secondary | ICD-10-CM | POA: Diagnosis present

## 2011-02-06 LAB — DIFFERENTIAL
Basophils Relative: 0 % (ref 0–1)
Eosinophils Absolute: 0.3 10*3/uL (ref 0.0–0.7)
Eosinophils Relative: 3 % (ref 0–5)
Monocytes Relative: 6 % (ref 3–12)

## 2011-02-06 LAB — BASIC METABOLIC PANEL
BUN: 19 mg/dL (ref 6–23)
CO2: 16 mEq/L — ABNORMAL LOW (ref 19–32)
Chloride: 115 mEq/L — ABNORMAL HIGH (ref 96–112)
Creatinine, Ser: 1.6 mg/dL — ABNORMAL HIGH (ref 0.4–1.2)
GFR calc Af Amer: 42 mL/min — ABNORMAL LOW (ref >60)
GFR calc non Af Amer: 35 mL/min — ABNORMAL LOW (ref >60)
Sodium: 142 mEq/L (ref 135–145)

## 2011-02-06 LAB — CBC
HCT: 39 % (ref 36.0–46.0)
Hemoglobin: 12.7 g/dL (ref 12.0–15.0)
MCV: 78 fL (ref 78.0–100.0)
RDW: 14.7 % (ref 11.5–15.5)

## 2011-02-07 ENCOUNTER — Inpatient Hospital Stay (HOSPITAL_COMMUNITY): Payer: No Typology Code available for payment source

## 2011-02-07 ENCOUNTER — Other Ambulatory Visit (HOSPITAL_COMMUNITY): Payer: PRIVATE HEALTH INSURANCE

## 2011-02-07 LAB — URINALYSIS, DIPSTICK ONLY
Leukocytes, UA: NEGATIVE
Specific Gravity, Urine: 1.018 (ref 1.005–1.030)
Urine Glucose, Fasting: NEGATIVE mg/dL

## 2011-02-07 LAB — CBC
HCT: 38.8 % (ref 36.0–46.0)
MCH: 25.7 pg — ABNORMAL LOW (ref 26.0–34.0)
MCHC: 32.7 g/dL (ref 30.0–36.0)
MCV: 78.4 fL (ref 78.0–100.0)
RBC: 4.95 MIL/uL (ref 3.87–5.11)
RDW: 14.4 % (ref 11.5–15.5)

## 2011-02-07 LAB — COMPREHENSIVE METABOLIC PANEL
ALT: 12 U/L (ref 0–35)
Alkaline Phosphatase: 64 U/L (ref 39–117)
BUN: 18 mg/dL (ref 6–23)
GFR calc Af Amer: 42 mL/min — ABNORMAL LOW (ref >60)
GFR calc non Af Amer: 35 mL/min — ABNORMAL LOW (ref >60)

## 2011-02-07 LAB — RAPID URINE DRUG SCREEN, HOSP PERFORMED
Amphetamines: NOT DETECTED
Barbiturates: NOT DETECTED
Benzodiazepines: POSITIVE — AB
Opiates: NOT DETECTED

## 2011-02-07 LAB — POCT I-STAT 3, ART BLOOD GAS (G3+)
O2 Saturation: 94 %
pCO2 arterial: 30.9 mmHg — ABNORMAL LOW (ref 35.0–45.0)
pH, Arterial: 7.354 (ref 7.350–7.400)

## 2011-02-21 ENCOUNTER — Telehealth (INDEPENDENT_AMBULATORY_CARE_PROVIDER_SITE_OTHER): Payer: Self-pay | Admitting: *Deleted

## 2011-02-27 NOTE — Progress Notes (Signed)
  Phone Note Call from Patient   Caller: Patient Call For: Seymour Bars DO Summary of Call: pt called and requested a refill of Crestor and Zoloft sent to [facility]. Pt has not been seen since 8/11 Initial call taken by: Avon Gully CMA, Duncan Dull),  February 21, 2011 4:26 PM    Prescriptions: CRESTOR 10 MG TABS (ROSUVASTATIN CALCIUM) 1 tab by mouth qhs  #90 x 1   Entered by:   Payton Spark CMA   Authorized by:   Seymour Bars DO   Signed by:   Payton Spark CMA on 02/21/2011   Method used:   Faxed to ...       [facility] [address] (mail-order)       [address] [place], [place]  [address]       Ph: [phone]       Fax: [phone]   RxID:   [id number] ZOLOFT 100 MG TABS (SERTRALINE HCL) 1.5 tabs by mouth daily  #135 x 1   Entered by:   Payton Spark CMA   Authorized by:   Seymour Bars DO   Signed by:   Payton Spark CMA on 02/21/2011   Method used:   Faxed to ...       [facility] [address] (mail-order)       [address] [place], [place]  [address]       Ph: [phone]       Fax: [phone]   RxID:   [id number]

## 2011-02-27 NOTE — Discharge Summary (Addendum)
NAME:  Desiree Wilson, Desiree Wilson NO.:  [id number]  MEDICAL RECORD NO.:  [id number]           PATIENT TYPE:  E  LOCATION:  [facility]                         [facility]AC[facility]  PHYSICIAN:  Nestor Ramp, MD        DATE OF BIRTH:  September 12, 1966  DATE OF ADMISSION:  02/06/2011 DATE OF DISCHARGE:  02/07/2011                              DISCHARGE SUMMARY   PRIMARY CARE PROVIDER:  Seymour Bars, DO at [facility].  DISCHARGE DIAGNOSES: 1. Chronic renal insufficiency. 2. Right trigeminal nerve neuralgia. 3. Temporalis nerve neuralgia. 4. Right ear deafness. 5. Hyperlipidemia. 6. Depression. 7. Anxiety. 8. Migraine headaches.  DISCHARGE MEDICATIONS: 1. Abilify 2 mg by mouth every morning. 2. Butrans 5 mcg per hour half transdermally weekly. 3. Clonazepam 1 mg by mouth twice daily as needed for anxiety. 4. Crestor 10 mg by mouth daily at bedtime. 5. Excedrin 2 tablets by mouth twice daily as needed for pain or     headaches. 6. Frova 2.5 mg by mouth daily as needed for severe migraines. 7. GenTeal 2 drops in both eyes twice daily as needed for dry eyes. 8. Phenergan 25 mg by mouth every 4 hours as needed for nausea with     Frova. 9. Tizanidine 4 mg by mouth 4 times daily as needed for pain. 10.Topiramate 300 mg by mouth daily at bedtime. 11.Tramadol 50-100 mg by mouth twice daily. 12.Trazodone 100-200 mg by mouth daily at bedtime. 13.Verapamil Sustained Release 120 mg by mouth daily at bedtime. 14.Vimpat 100 mg in the morning, 200 mg at night. 15.Yaz 1 tablet by mouth daily at bedtime. 16.Zoloft 150 mg by mouth every morning.  CONSULTS:  None.  PROCEDURES AND IMAGING:  Shoulder and C-spine x-rays performed February 07, 2011, demonstrated no acute abnormalities or degenerative changes.  LABORATORY DATA:  Complete metabolic panel was notable for a creatinine of 1.62 giving a GFR of 35.  Albumin was also slightly low at 3.2.  CBC was relatively unremarkable  except for a platelet count of 138. Urinalysis and creatine kinase were within normal limits.  BRIEF HOSPITAL COURSE:  This is a 45 year old female who presented to the emergency department with right shoulder and neck pain and difficulty concentrating and focusing.. 1. Arm, shoulder, and neck pain:  The patient was noted to have     significant musculoskeletal tenderness on exam at the time of     admission.  X-rays were performed to assure no acute process was     involved in this pain.  As noted above, they were unremarkable.     Morning following admission, the patient was no longer having     problems with this pain. 2. Difficulty concentrating:  The patient was noted to be on a large     number of medications that can contribute to difficulties both with     focusing and concentrating.  Upon discovering that the patient had     a mildly elevated creatinine, a careful inspection of her med list     demonstrated that it is likely that some of her medication doses  need to be adjusted given her relative renal insufficiency.     Medication of most note for this would be the patient's Topamax.     As no other etiology for the patient's difficulties was found, and     the patient's neurologic exam was relatively unremarkable.  We feel     that it is most likely that this represents a medication effect.     Further questioning, the patient revealed that this was not an     acute issue; and accordingly, we felt it best to defer changes in     this patient's medication regimen to the physicians who have been     taking care of her in the longer term.  DISCHARGE INSTRUCTIONS:  The patient was discharged home with no activity restrictions, low-sodium heart-healthy diet, and instructions to discuss decreasing or stopping her Topamax.  The patient was also instructed to make sure to keep her appointment with Dr. Allena Katz, her nephrologist who she has yet to see.  ISSUES FOR FOLLOWUP: 1.  Renal insufficiency, likely chronic:  We are uncertain of etiology     of the patient's likely chronic kidney disease.  Follow up with     Nephrology as advised. 2. Multiple medications:  The patient is noted to be on multiple     medications for her pain management.  While we understand that the     patient has many issues with pain that has been difficult to     control, we feel that the patient's difficulties with concentrating     is likely due to polypharmacy.  Careful infection of the patient's     medication list and adjusting doses for her renal insufficiency is     highly recommended.  FOLLOWUP APPOINTMENTS: 1. Dr. Cornelius Moras, the patient is to call for an appointment for 1-2 weeks. 2. [facility], the patient was scheduled for an     appointment the week following discharge.  DISCHARGE CONDITION:  The patient was discharged home in stable medical condition.    ______________________________ Majel Homer, MD   ______________________________ Nestor Ramp, MD    ER/MEDQ  D:  02/13/2011  T:  02/14/2011  Job:  [id number]  cc:   Seymour Bars, D.O. [facility]  Electronically Signed by Manuela Neptune MD on 03/03/2011 09:27:08 AM Electronically Signed by Denny Levy MD on 03/07/2011 01:11:52 PM

## 2011-03-06 ENCOUNTER — Ambulatory Visit (INDEPENDENT_AMBULATORY_CARE_PROVIDER_SITE_OTHER): Payer: No Typology Code available for payment source | Admitting: Family Medicine

## 2011-03-06 ENCOUNTER — Encounter: Payer: Self-pay | Admitting: Family Medicine

## 2011-03-06 DIAGNOSIS — M25579 Pain in unspecified ankle and joints of unspecified foot: Secondary | ICD-10-CM

## 2011-03-11 NOTE — H&P (Signed)
NAMEASHLINN, HEMRICK             ACCOUNT NO.:  [id number]  MEDICAL RECORD NO.:  [id number]           PATIENT TYPE:  E  LOCATION:  [facility]                         [facility]ACILITY[facility]  [facility]  PHYSICIAN:  Annetta Deiss A. Sheffield Slider, M.D.    DATE OF BIRTH:  03/21/1966  DATE OF ADMISSION:  02/06/2011 DATE OF DISCHARGE:  02/06/2011                             HISTORY & PHYSICAL   PRIMARY CARE PROVIDER:  Seymour Bars, DO, at [facility].  NEPHROLOGIST:  Zetta Bills, MD, at [facility].  CHIEF COMPLAINT:  Right arm pain, "I can't focus because my thoughts are blurry."  HISTORY OF PRESENT ILLNESS:  This is a 45 year old female with chronic pain from trigeminal nerve injury leading to neuralgia who presented to the ER with a complaint of right arm pain and difficulty concentrating/focusing.  The patient has had multiple procedures/surgery to correct trigeminal nerve injury in 1981.  When the patient developed right arm pain yesterday, she was concerned that there may be a thrombus in her right upper extremity.  She has also noticed difficulty doing her normal yoga exercises due to neck and right shoulder pain.  Earlier yesterday, the patient could not seem to focus on any activities.  She states that her "thoughts are blurry." She has to concentrate to bring her thoughts together.  Currently, she states that she has to concentrate to tell me her past medical history.  She also states that she can still drive and she denies any episodes of syncope, lost consciousness, weakness or vision changes.  The patient states that she cannot describe her feelings.  She just knows that she does not feel right.   Note: Patient is on multiple medications for pain.  These are prescribed  by the Pain Clinic and her Neurologist.  PAST MEDICAL HISTORY: 1. 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 2. Temporalis nerve neuralgia in 2002. 3. Right ear deafness  after procedure in 2005. 4. Hyperlipidemia. 5. Depression. 6. Anxiety. 7. Botox injection every 3 months by her neurologist, Dr.Clark     Pypinyn.  PAST SURGICAL HISTORY: 1. Multiple reconstructive facial surgery from 1981 to 1997. 2. Auditory nerve damage in 2005.  MEDICATIONS: 1. BuTrans transdermal patch weekly per Pain Clinic. 2. Abilify daily. 3. Gianvi oral contraceptive, take as directed. 4. Azelastine as needed. 5. Verapamil daily. 6. Phenergan 25 mg as needed for nausea. 7. Crestor at bedtime. 8. Zanaflex p.r.n. 9. Trazodone at bedtime as needed for insomnia. 10.Topamax 3 tablets at bedtime. 11.Lacosamide 50 mg p.o. daily. 12.Sertraline 150 mg p.o. daily. 13.Fioricet 50 mg p.o. as needed for headache. 14.Klonopin 1 mg p.o. daily. 15.Ultram 100 mg p.o. b.i.d. as needed for pain. 16.Frova 2.5 mg p.o. p.r.n. headache. 16..  Ketamine IV q.6-7 h. weeks per pain clinic. 1. Note, the patient does not know the doses of her medications and  Medication reconcilliation is not available currently in her med record.  ALLERGIES:  METHADONE causes hallucination.  SOCIAL HISTORY: 1. The patient lives alone in [place].  She has a graduate degree     in education.  She is currently disabled from her multiple and     chronic nerve injury. 2. Tobacco, denies. 3. Alcohol denies. 4. Drugs denied.  FAMILY HISTORY: 1. Mother with hypertension. 2. Father with CVA, hyperlipidemia, hypertension. 3. A younger brother who is healthy.  REVIEW OF SYSTEMS:  GENERAL:  Denies fevers, chills, sweats, endorses fatigue.  HEENT: Endorses headache and ear pain.  CARDIOVASCULAR: Denies chest pain, dyspnea, orthopnea.  RESPIRATORY:  Denies cough, dyspnea, wheezing.  GI: Denies nausea, vomiting, melena, abdominal pain. SKIN:  Denies rash.  MUSCULOSKELETAL: Complains of neck pain x2 days. NEURO:  Complains of numbness.  HEME:  Denies bleeding, easy bruising. ENDOCRINE:  Denies polyuria,  polydipsia.  PHYSICAL EXAMINATION:  VITAL SIGNS:  Temperature 97.3, pulse 83, respiratory rate 17, blood pressure 122/84, O2 sat 98% on room air. GENERAL:  No apparent distress. HEENT:  Pupils equal, round, and reactive to light and accommodation. Extraocular motility intact.  Moist mucous membrane.  Face, right facial droop at baseline.  Sensation intact on face.  The patient denies hearing in the right side. NECK:  Full range of motion.  Positive tenderness to lateral rotation on the right side of the neck.  Posterior neck and cervical area with tenderness to palpation. CARDIOVASCULAR:  Regular rate and rhythm.  No murmurs, gallops.  S1 and S2 soft. LUNGS:  Clear to auscultation bilaterally.  No wheezing, rales, rhonchi. ABDOMEN:  Soft, nontender, nondistended.  Positive bowel sounds. BACK:  Nontender. EXTREMITIES:  No edema or cyanosis.  Right upper extremity with tenderness to lateral rotation of right shoulder.  Strength is 5/5 in right upper extremity but with tenderness in the right shoulder against the pausing force. NEURO:  Cranial nerves II through XII grossly intact.  Finger-to-nose intact.  Strength 5/5 in right in upper and lower extremity.  Heel-to- shin intact.  Downward Romberg.  Deep tendon reflexes intact.  Sensation intact.  LABORATORIES AND STUDIES: 1. BMET showed sodium 142, potassium 4.1, chloride 115, CO2 of 16, BUN     19, creatinine 1.6, glucose 109. 2. CBC shows white blood cell 9.8, hemoglobin 12.7, hematocrit 39.0,     platelet 110. 3. Right upper extremity ultrasound was negative for DVT. 4. Total CK was 58.  ASSESSMENT AND PLAN:  This is a 45 year old female with right arm/shoulder/neck pain and difficulty concentrating/focusing. 1. Right arm, shoulder, neck pain.  On exam, the patient does have     tenderness along the right scalenes muscles, right trapezius     muscle.  She also has tenderness with the left lateral neck     rotation.  She has had  multiple surgeries along the mouth/face/neck     area, so there may be some nerve damage along the cervical nerve     root.  Pain may be originating from her neck.  We will get cervical     spine xray.  The patient also has some weakness in the SITS muscle.  We     will get right shoulder x-ray. 2. Difficulty concentrating/focusing.  The patient has had a chronic     history of pain and she is on multiple medications to control her     pain.  She sees Neurology and is also a patient of the pain clinic.     I am concerned that her symptoms may be secondary to multiple     medical management.  I have discussed my concerns with the patient     and she agree not to take her  home medication and to try.  IV pain     medication that we will give at hospital on as needed basis.  We     will also check a UDS.  The patient also agreed to withhold any     sedating medication as we assess her concentration focusing     complaint.  Differential diagnosis does include cerebral ischemic     event.  The patient's exam was nonfocal, she is young and her risk     factor is hyperlipidemia.  I do not feel that a CVA is the source     of her complaints and cannot work this up further at this time. 3. Non-anion gap metabolic acidosis.  This was seen on labs as the     patient was in the ER with a CO2 of 16.  This may be from her     chronic kidney disease.  The patient does endorse a history of     increased creatinine for which she sees Dr. Allena Katz.  Dr. Allena Katz has     recommended that she increase her protein intake such as iron     supplement and red meat.  We will repeat a BMET and also check her     LFTs to monitor her protein level.  We will touch base with Dr.     Allena Katz in the morning to get baseline CO2 and creatinine. 4. Increased creatinine of 1.6.  We do not have the patient's     baseline.  She does endorse a history of the elevated creatinine     and is followed by Dr. Allena Katz.  Please see above for  the rest of     explanation. 5. Thrombocytopenia.  She has a platelet count of 110.  We do not have     the patient's baseline in our system.  We will repeat this in the     a.m. and will monitor for now. 6. Fluids, electrolytes, nutrition/Gastrointestinal.  The patient will     have a regular diet.  Hep-Lock IV. 7. Prophylaxis.  Bilateral SCDs. 8. Code status DNR. 9. Disposition.  Upon clinical improvement.     Cat Ta, MD   ______________________________ Arnette Norris Sheffield Slider, M.D.    CT/MEDQ  D:  02/07/2011  T:  02/07/2011  Job:  [id number]  Electronically Signed by CAT TA MD on 02/11/2011 04:00:58 PM Electronically Signed by Zachery Dauer M.D. on 03/11/2011 08:42:34 PM

## 2011-03-13 NOTE — Assessment & Plan Note (Signed)
Summary: Left ankle pain post IV placement   Vital Signs:  Patient profile:   45 year old female Menstrual status:  regular Height:      64 inches Weight:      170 pounds Pulse rate:   96 / minute BP sitting:   123 / 88  (right arm) Cuff size:   regular  Vitals Entered By: Avon Gully CMA, Duncan Dull) (March 06, 2011 10:04 AM) CC: pain in left foot from IV   Primary Care Provider:  Seymour Bars DO  CC:  pain in left foot from IV.  History of Present Illness: Hx of trigeminal neuralgia secondary to surgery when she was 16.  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  Was on opiates for 6 years and now get IV infusions ofr lidocaine or ketamin. Last tuesday was getting an infusion in her left foot and since then has been having an intense burning adn shooting pain from the dorsal surface of her left foot to her mid lower leg. Can only wear flats.  No numbness or tingling. No weakness.  No exacerbating sxs.  Pain is 8/10.    Current Medications (verified): 1)  Tramadol Hcl 50 Mg  Tabs (Tramadol Hcl) .... Four Times Daily 2)  Trazodone Hcl 100 Mg Tabs (Trazodone Hcl) .Marland Kitchen.. 1-2 By Mouth At Bedtime 3)  Topiramate 100 Mg Tabs (Topiramate) .... Take 3 Tabs Once Daily 4)  Verapamil Hcl Cr 120 Mg Cr-Tabs (Verapamil Hcl) .Marland Kitchen.. 1 By Mouth Once Daily 5)  Clonazepam 1 Mg Tabs (Clonazepam) .Marland Kitchen.. 1 By Mouth Two Times A Day 6)  Naproxen Sodium 220 Mg Tabs (Naproxen Sodium) .Marland Kitchen.. 1 By Mouth Once Daily 7)  Tizanidine Hcl 4 Mg Tabs (Tizanidine Hcl) .Marland Kitchen.. 1 By Mouth Four Times Daily 8)  Vimpat 100 Mg Tabs (Lacosamide) .Marland Kitchen.. 1 By Mouth Three Times A Day 9)  Zoloft 100 Mg Tabs (Sertraline Hcl) .... 1.5 Tabs By Mouth Daily 10)  Yaz 3-0.02 Mg Tabs (Drospirenone-Ethinyl Estradiol) .Marland Kitchen.. 1 By Mouth Once Daily 11)  Acetaminophen 325 Mg Tabs (Acetaminophen) .... As Needed 12)  Frova 2.5 Mg Tabs (Frovatriptan Succinate) .... Once Daily 13)  Crestor 10 Mg Tabs (Rosuvastatin Calcium) .Marland Kitchen.. 1 Tab By Mouth Qhs 14)  Abilify 2 Mg Tabs  (Aripiprazole) .Marland Kitchen.. 1 Tab By Mouth Q Pm 15)  Astepro 0.15 % Soln (Azelastine Hcl) .... 2 Sprays Per Nostril Two Times A Day As Needed 16)  Fluconazole 150 Mg Tabs (Fluconazole) .Marland Kitchen.. 1 Tab By Mouth X 1 17)  Butrans 5 Mcg/hr Ptwk (Buprenorphine)  Allergies (verified): 1)  ! Methadone Hcl (Methadone Hcl) 2)  ! Phenobarbital (Phenobarbital) 3)  ! Azithromycin (Azithromycin)  Comments:  Nurse/Medical Assistant: The patient's medications and allergies were reviewed with the patient and were updated in the Medication and Allergy Lists. Avon Gully CMA, Duncan Dull) (March 06, 2011 10:05 AM)  Past History:  Past Medical History: Last updated: 07/25/2010 Allergic rhinitis Blood transfusion Depression Headache Hyperlipidemia Osteoarthritis R facial paralysis/ chronic pain syndrome.  (pain managment Dr Kendell Bane) G0 R trigeminal neuralgia.    Past Surgical History: Last updated: 07/05/2009 osteotomy  Family History: Last updated: 07/05/2009 father: healthy Mother: healthy sister healthy  Social History: Last updated: 07/05/2009 Lives alone. parents nearby no relationships on disability denies smoking or ETOH  Physical Exam  General:  Well-developed,well-nourished,in no acute distress; alert,appropriate and cooperative throughout examination Msk:  Left leg wiht no swelling. Ankle with NROM, no laxity. She is tender over the top of the  foot where she had the IV and above the medial malleolus.  No erythema.   Pulses:  DP and post tib pulse 1+ on left foot.    Impression & Recommendations:  Problem # 1:  ANKLE PAIN, LEFT (ICD-719.47)  Discussed that she really needs to go back and see her pain specialist as I think this is a complication form her procedure (IV placment). I don't see any MSK problems with her ankle. I don't see the classic signs of phlebitis but recommend a ACE wrapf for comfort and compression. Elevated when can.  She is already on pain meds so I really don't  have additional recommendations there. Do rec she f/u with pain specialist this week for furher evaluation.   Orders: Ace Wraps 3-5 in/yard  (Z6109)  Complete Medication List: 1)  Tramadol Hcl 50 Mg Tabs (Tramadol hcl) .... Four times daily 2)  Trazodone Hcl 100 Mg Tabs (Trazodone hcl) .Marland Kitchen.. 1-2 by mouth at bedtime 3)  Topiramate 100 Mg Tabs (Topiramate) .... Take 3 tabs once daily 4)  Verapamil Hcl Cr 120 Mg Cr-tabs (Verapamil hcl) .Marland Kitchen.. 1 by mouth once daily 5)  Clonazepam 1 Mg Tabs (Clonazepam) .Marland Kitchen.. 1 by mouth two times a day 6)  Naproxen Sodium 220 Mg Tabs (Naproxen sodium) .Marland Kitchen.. 1 by mouth once daily 7)  Tizanidine Hcl 4 Mg Tabs (Tizanidine hcl) .Marland Kitchen.. 1 by mouth four times daily 8)  Vimpat 100 Mg Tabs (Lacosamide) .Marland Kitchen.. 1 by mouth three times a day 9)  Zoloft 100 Mg Tabs (Sertraline hcl) .... 1.5 tabs by mouth daily 10)  Yaz 3-0.02 Mg Tabs (Drospirenone-ethinyl estradiol) .Marland Kitchen.. 1 by mouth once daily 11)  Frova 2.5 Mg Tabs (Frovatriptan succinate) .... Once daily 12)  Crestor 10 Mg Tabs (Rosuvastatin calcium) .Marland Kitchen.. 1 tab by mouth qhs 13)  Abilify 2 Mg Tabs (Aripiprazole) .Marland Kitchen.. 1 tab by mouth q pm 14)  Astepro 0.15 % Soln (Azelastine hcl) .... 2 sprays per nostril two times a day as needed 15)  Butrans 5 Mcg/hr Ptwk (Buprenorphine)   Orders Added: 1)  Est. Patient Level III [60454] 2)  Ace Wraps 3-5 in/yard  [U9811]

## 2011-04-11 ENCOUNTER — Ambulatory Visit: Payer: No Typology Code available for payment source | Admitting: Family Medicine

## 2011-04-12 ENCOUNTER — Encounter: Payer: Self-pay | Admitting: Family Medicine

## 2011-04-13 ENCOUNTER — Ambulatory Visit (INDEPENDENT_AMBULATORY_CARE_PROVIDER_SITE_OTHER): Payer: No Typology Code available for payment source | Admitting: Family Medicine

## 2011-04-13 ENCOUNTER — Encounter: Payer: Self-pay | Admitting: Family Medicine

## 2011-04-13 DIAGNOSIS — D696 Thrombocytopenia, unspecified: Secondary | ICD-10-CM

## 2011-04-13 DIAGNOSIS — N183 Chronic kidney disease, stage 3 unspecified: Secondary | ICD-10-CM

## 2011-04-13 DIAGNOSIS — I10 Essential (primary) hypertension: Secondary | ICD-10-CM

## 2011-04-13 DIAGNOSIS — N189 Chronic kidney disease, unspecified: Secondary | ICD-10-CM

## 2011-04-13 DIAGNOSIS — F3289 Other specified depressive episodes: Secondary | ICD-10-CM

## 2011-04-13 DIAGNOSIS — F329 Major depressive disorder, single episode, unspecified: Secondary | ICD-10-CM

## 2011-04-13 MED ORDER — VERAPAMIL HCL ER 180 MG PO CP24: 180.0000 mg | ORAL_CAPSULE | Freq: Every day | ORAL | Status: DC

## 2011-04-13 MED ORDER — ARIPIPRAZOLE 2 MG PO TABS: ORAL_TABLET | ORAL | Status: DC

## 2011-04-13 NOTE — Assessment & Plan Note (Signed)
BP high today with elevated HR.  I increased her verapamil from 120--> 180 mg daily.  F/u in 3 mos.

## 2011-04-13 NOTE — Patient Instructions (Signed)
Update labs downstairs today.  Will fax a copy to Dr Allena Katz.  Wean down on Abilify to 1/2 tab once daily for 2 wks the STOP.  F/u with therapist and call me if any problems.  Increase Verapamil from 120--> 180 mg once daily for BP/ HR.  Return for f/u mood/ kidneys/  Cholesterol  in 3 mos.

## 2011-04-13 NOTE — Assessment & Plan Note (Signed)
Improved!  She has done great with therapy and it helps that her chronic pain is well managed by Dr Geni Bers.  I agree to wean her down and then off the Abilify.  I've asked her to see the therapist in the next month and to call me if she sees a worsening in her mood.  She will stay on all of theother mood meds.

## 2011-04-13 NOTE — Progress Notes (Signed)
  Subjective:    Patient ID: Desiree Wilson, female    DOB: November 21, 1966, 45 y.o.   MRN: [id number]  HPI 45yo WF presents for f/u visit.  She reports that her mood has much improved with ongoing therapy with [facility] in [place].  She is seeing Dr Geni Bers at the pain clinic and is now doing lidocaine infusions instead of ketamine infusions.  She is also on a Butrans patch for pain.  This seems to be working rather well.  She is still on Zoloft, Trazadone and Klonopin for her mood.  She wants to stop Abilify.  Doesn't feel like she needs it anymore since she has a good support system, she is not dealing with anger issues and is not depressed like she was in the past.    She is taking all of her other meds and sees Dr Allena Katz for CKD with a GFR of 35.    BP 150/89  Pulse 108  Ht 5\' 4"  (1.626 m)  Wt 167 lb (75.751 kg)  BMI 28.67 kg/m2  SpO2 96%  LMP 03/30/2011  Patient Active Problem List  Diagnoses  . HYPERLIPIDEMIA  . DEPRESSION  . ALLERGIC RHINITIS  . CHRONIC KIDNEY DISEASE STAGE III (MODERATE)  . RENAL INSUFFICIENCY  . OSTEOARTHRITIS  . HYPERSOMNIA  . FATIGUE  . HEADACHE  . ANKLE PAIN, LEFT       Review of Systems  Constitutional: Negative for fatigue and unexpected weight change.  Respiratory: Negative for shortness of breath.   Cardiovascular: Negative for chest pain, palpitations and leg swelling.  Gastrointestinal: Positive for diarrhea and constipation. Negative for nausea, vomiting and blood in stool.  Genitourinary: Negative for urgency.  Skin: Negative for pallor.  Neurological: Negative for dizziness and headaches.  Psychiatric/Behavioral: Negative for suicidal ideas, sleep disturbance, dysphoric mood, decreased concentration and agitation. The patient is not nervous/anxious.        Objective:   Physical Exam  Constitutional: She is oriented to person, place, and time. She appears well-developed and well-nourished. No distress.       overwt  HENT:   Chronic changes from facial reconstructive surgery  Eyes: Conjunctivae are normal.  Neck: Normal range of motion. Neck supple. No thyromegaly present.  Cardiovascular: Regular rhythm and normal heart sounds.  Tachycardia present.   Pulmonary/Chest: Effort normal and breath sounds normal.  Musculoskeletal: She exhibits no edema.  Lymphadenopathy:    She has no cervical adenopathy.  Neurological: She is alert and oriented to person, place, and time.  Skin: Skin is warm and dry.  Psychiatric: She has a normal mood and affect.          Assessment & Plan:

## 2011-04-13 NOTE — Assessment & Plan Note (Signed)
GFR 35.  Will recheck a CBC and CMP.  She sees Dr Allena Katz at [facility].  Will fax him a copy of labs.  REmoved naproxen from her med list.

## 2011-04-14 LAB — COMPLETE METABOLIC PANEL WITH GFR
Alkaline Phosphatase: 65 U/L (ref 39–117)
BUN: 21 mg/dL (ref 6–23)
CO2: 20 mEq/L (ref 19–32)
Creat: 1.59 mg/dL — ABNORMAL HIGH (ref 0.40–1.20)
GFR, Est African American: 43 mL/min — ABNORMAL LOW (ref >60)
GFR, Est Non African American: 35 mL/min — ABNORMAL LOW (ref >60)
Glucose, Bld: 93 mg/dL (ref 70–99)
Sodium: 141 mEq/L (ref 135–145)
Total Bilirubin: 0.3 mg/dL (ref 0.3–1.2)

## 2011-04-14 LAB — CBC WITH DIFFERENTIAL/PLATELET
Basophils Relative: 0 % (ref 0–1)
Eosinophils Absolute: 0.2 10*3/uL (ref 0.0–0.7)
HCT: 43.3 % (ref 36.0–46.0)
Hemoglobin: 13.4 g/dL (ref 12.0–15.0)
MCH: 25.4 pg — ABNORMAL LOW (ref 26.0–34.0)
MCHC: 30.9 g/dL (ref 30.0–36.0)
Monocytes Absolute: 0.5 10*3/uL (ref 0.1–1.0)
Monocytes Relative: 5 % (ref 3–12)

## 2011-04-16 ENCOUNTER — Telehealth: Payer: Self-pay | Admitting: Family Medicine

## 2011-04-16 NOTE — Telephone Encounter (Signed)
Pls let pt know that her labs are stable.  Her platelets came up to normal.  Kidney function unchanged (fax copy of labs to Dr Allena Katz at [facility]).

## 2011-04-16 NOTE — Telephone Encounter (Signed)
LMOM informing Pt of the above 

## 2011-05-29 ENCOUNTER — Other Ambulatory Visit: Payer: Self-pay | Admitting: Family Medicine

## 2011-06-11 ENCOUNTER — Other Ambulatory Visit: Payer: Self-pay | Admitting: *Deleted

## 2011-06-11 MED ORDER — CLONAZEPAM 1 MG PO TABS: 1.0000 mg | ORAL_TABLET | Freq: Two times a day (BID) | ORAL | Status: DC

## 2011-06-14 ENCOUNTER — Other Ambulatory Visit: Payer: Self-pay | Admitting: *Deleted

## 2011-06-14 MED ORDER — DROSPIRENONE-ETHINYL ESTRADIOL 3-0.02 MG PO TABS: 1.0000 | ORAL_TABLET | Freq: Every day | ORAL | Status: DC

## 2011-07-05 ENCOUNTER — Other Ambulatory Visit: Payer: Self-pay | Admitting: *Deleted

## 2011-07-05 MED ORDER — DROSPIRENONE-ETHINYL ESTRADIOL 3-0.02 MG PO TABS: 1.0000 | ORAL_TABLET | Freq: Every day | ORAL | Status: DC

## 2011-08-14 ENCOUNTER — Inpatient Hospital Stay (INDEPENDENT_AMBULATORY_CARE_PROVIDER_SITE_OTHER)
Admission: RE | Admit: 2011-08-14 | Discharge: 2011-08-14 | Disposition: A | Discharge status: Home / Self Care | Payer: No Typology Code available for payment source | Source: Ambulatory Visit | Attending: Family Medicine | Admitting: Family Medicine

## 2011-08-14 ENCOUNTER — Encounter: Payer: Self-pay | Admitting: Family Medicine

## 2011-08-14 DIAGNOSIS — R197 Diarrhea, unspecified: Secondary | ICD-10-CM

## 2011-08-15 ENCOUNTER — Encounter: Payer: Self-pay | Admitting: Family Medicine

## 2011-08-20 ENCOUNTER — Encounter: Payer: Self-pay | Admitting: Family Medicine

## 2011-08-20 ENCOUNTER — Other Ambulatory Visit (HOSPITAL_COMMUNITY)
Admission: RE | Admit: 2011-08-20 | Discharge: 2011-08-20 | Disposition: A | Discharge status: Home / Self Care | Payer: No Typology Code available for payment source | Source: Ambulatory Visit | Attending: Family Medicine | Admitting: Family Medicine

## 2011-08-20 ENCOUNTER — Ambulatory Visit (INDEPENDENT_AMBULATORY_CARE_PROVIDER_SITE_OTHER): Payer: No Typology Code available for payment source | Admitting: Family Medicine

## 2011-08-20 VITALS — BP 123/82 | HR 108 | Ht 65.0 in | Wt 159.0 lb

## 2011-08-20 DIAGNOSIS — Z01419 Encounter for gynecological examination (general) (routine) without abnormal findings: Secondary | ICD-10-CM

## 2011-08-20 DIAGNOSIS — Z1231 Encounter for screening mammogram for malignant neoplasm of breast: Secondary | ICD-10-CM

## 2011-08-20 DIAGNOSIS — Z13220 Encounter for screening for lipoid disorders: Secondary | ICD-10-CM

## 2011-08-20 MED ORDER — NYSTATIN-TRIAMCINOLONE 100000-0.1 UNIT/GM-% EX CREA: TOPICAL_CREAM | Freq: Three times a day (TID) | CUTANEOUS | Status: DC

## 2011-08-20 NOTE — Progress Notes (Signed)
  Subjective:    Patient ID: Desiree Wilson, female    DOB: 11/27/1966, 45 y.o.   MRN: [id number]  HPI  45 yo WF presents for a CPE with pap.  She has some vaginal itching.  She is due for her mammogram.  She is seeing Dr Allena Katz for CKD.  She is now on Integra Plus for low iron.  She is on Yaz for birth control and is happy on it.  She does not smoke and has well controlled HTN.  She is seeing [facility] for a corneal abrasion on the R side due to not being able to close her eye completely.  She is not sexually active nor in any relationships.  Periods are normal.  Denies fam hx of colon cancer.    BP 123/82  Pulse 108  Ht 5\' 5"  (1.651 m)  Wt 159 lb (72.122 kg)  BMI 26.46 kg/m2  SpO2 96%  LMP 08/06/2011   Review of Systems Gen: no fevers, chills, hot flashes, night sweats, change in weight GI: no N/V/C/D GU: no dysuria, incontinence or sexual dysfunction CV: no chest pain, DOE, palpitations s or edema Pulm:  Denies CP, SOB or chronic cough      Objective:   Physical Exam  Pulmonary/Chest: Right breast exhibits no mass, no nipple discharge, no skin change and no tenderness. Left breast exhibits no mass, no nipple discharge, no skin change and no tenderness. Breasts are symmetrical.  Genitourinary: Uterus normal. Cervix exhibits discharge. Cervix exhibits no motion tenderness. Right adnexum displays no mass and no tenderness. Left adnexum displays no mass and no tenderness. Vaginal discharge found.    Gen: alert, well groomed in NAD Neck: no thyromegaly or cervical lymphadenopathy CV: RRR w/o murmur, no audible carotid bruits or abdominal aortic bruits Ext: no edema, clubbing or cyanosis Lungs: CTA bilat w/o W/R/R; nonlabored HEENT:  /AT; PERRLA; oropharynx pink and moist with good dentition Abd: soft, NT, ND, NABS, No HSM, no audible AA bruits Skin: warm and dry; no rash, pallor or jaundice Psych: does not appear anxious or depressed; answers questions appropriately        Assessment & Plan:  Assesment:  1. CPE- Keeping healthy checklist for women reviewed today.  BP at goal.  BMI 26 in the overwt range.     Labs ordered Colonoscopy due at 50.  Tdap done 2011. Contraception- continue YAZ. Last pap- done today. Mammogram ordered. Encouraged healthy diet, regular exercise, MVI daily. Return for next physical in 1 yr.

## 2011-08-20 NOTE — Patient Instructions (Signed)
Update cholesterol testing today.  Will call you with pap smear and lab results in the next wk.  RX for nystatin / Triamcinolone given for genital itching.  Return for f/u in 6 mos.

## 2011-08-21 ENCOUNTER — Telehealth: Payer: Self-pay | Admitting: Family Medicine

## 2011-08-21 LAB — LIPID PANEL
LDL Cholesterol: 78 mg/dL (ref 0–99)
Triglycerides: 243 mg/dL — ABNORMAL HIGH (ref <150)
VLDL: 49 mg/dL — ABNORMAL HIGH (ref 0–40)

## 2011-08-21 NOTE — Telephone Encounter (Signed)
pls let pt know that her cholesterol looks pretty good on Crestor 10 mg /day but her TGs (fats in the bloodstream) are elevated at 243, should be <150.  Work on [employer]- limit fried foods, red meat, animal fat to get this down.  Work on regular exercise and can add Omega 3 Fish Oil 2 grams/ day also.  Recheck in 1 yr.

## 2011-08-22 NOTE — Telephone Encounter (Signed)
Pt aware.

## 2011-08-23 ENCOUNTER — Telehealth: Payer: Self-pay | Admitting: Family Medicine

## 2011-08-23 MED ORDER — NYSTATIN 100000 UNIT/GM EX OINT: TOPICAL_OINTMENT | Freq: Two times a day (BID) | CUTANEOUS | Status: DC

## 2011-08-23 MED ORDER — FLUCONAZOLE 150 MG PO TABS: 150.0000 mg | ORAL_TABLET | Freq: Once | ORAL | Status: AC

## 2011-08-23 NOTE — Telephone Encounter (Signed)
Pt notified of MD instructions. KJ LPN 

## 2011-08-23 NOTE — Telephone Encounter (Signed)
Pls let pt know that her insurance did not cover the nystatin - triamcinolone cream so I changed her to plain nystatin ointment.  Hopefully this is covered.  Should not be expensive.  Sent to her pharmacy to pick up.

## 2011-08-23 NOTE — Telephone Encounter (Signed)
Pls let pt know that her pap smear came back normal but she did have a yeast infection.  Will add one oral tab of RX diflucan to take for this.  Repeat pap in 2 yrs.

## 2011-08-24 NOTE — Telephone Encounter (Signed)
Pt notified of results and MD instructions. KJ LPN 

## 2011-09-12 ENCOUNTER — Other Ambulatory Visit: Payer: Self-pay | Admitting: Family Medicine

## 2011-09-12 MED ORDER — SERTRALINE HCL 100 MG PO TABS: 100.0000 mg | ORAL_TABLET | Freq: Every day | ORAL | Status: DC

## 2011-09-12 MED ORDER — ROSUVASTATIN CALCIUM 10 MG PO TABS: 10.0000 mg | ORAL_TABLET | Freq: Every day | ORAL | Status: DC

## 2011-09-12 MED ORDER — DROSPIRENONE-ETHINYL ESTRADIOL 3-0.02 MG PO TABS: 1.0000 | ORAL_TABLET | Freq: Every day | ORAL | Status: DC

## 2011-09-12 NOTE — Telephone Encounter (Signed)
Pt calling for refills for her crestor, zoloft, and Yaz for [facility] mail order. Plan:  Reviewed the pt chart and she now is using [facility] and was recently seen in April 2012.  Will refill meds to the mail order, and pt has depression disorder appt sched the end of OCt 2012. Jarvis Newcomer, LPN [facility]

## 2011-10-02 LAB — COMPREHENSIVE METABOLIC PANEL
ALT: <6
Alkaline Phosphatase: 96
CO2: 20
Calcium: 9.7
GFR calc non Af Amer: 41 — ABNORMAL LOW
Glucose, Bld: 94
Sodium: 144
Total Bilirubin: 0.5

## 2011-10-02 LAB — DIFFERENTIAL
Basophils Relative: 0
Eosinophils Absolute: 0.1
Lymphs Abs: 2.9
Neutro Abs: 5.6
Neutrophils Relative %: 62

## 2011-10-02 LAB — CBC
Hemoglobin: 13.6
MCHC: 32.3
RBC: 5.3 — ABNORMAL HIGH

## 2011-10-02 LAB — LIPASE, BLOOD: Lipase: 165

## 2011-10-02 LAB — CLOSTRIDIUM DIFFICILE EIA

## 2011-10-02 LAB — STOOL CULTURE

## 2011-10-02 LAB — OCCULT BLOOD X 1 CARD TO LAB, STOOL: Fecal Occult Bld: NEGATIVE

## 2011-11-02 ENCOUNTER — Other Ambulatory Visit: Payer: Self-pay | Admitting: *Deleted

## 2011-11-02 MED ORDER — DROSPIRENONE-ETHINYL ESTRADIOL 3-0.02 MG PO TABS: 1.0000 | ORAL_TABLET | Freq: Every day | ORAL | Status: DC

## 2011-11-05 ENCOUNTER — Other Ambulatory Visit: Payer: Self-pay | Admitting: *Deleted

## 2011-11-05 MED ORDER — ARIPIPRAZOLE 2 MG PO TABS: 2.0000 mg | ORAL_TABLET | Freq: Every day | ORAL | Status: DC

## 2011-12-03 NOTE — Progress Notes (Signed)
Summary: 4th day of loose stoles rm 2   Vital Signs:  Patient Profile:   45 Years Old Female CC:      diarrhea x 4 days Height:     64 inches Weight:      153.50 pounds O2 Sat:      97 % O2 treatment:    Room Air Temp:     98.3 degrees F oral Pulse rate:   111 / minute Resp:     16 per minute BP sitting:   120 / 86  (left arm) Cuff size:   regular  Vitals Entered By: Clemens Catholic LPN (August 14, 2011 11:45 AM)                  Prior Medication List:  TRAMADOL HCL 50 MG  TABS (TRAMADOL HCL) four times daily TRAZODONE HCL 100 MG TABS (TRAZODONE HCL) 1-2 by mouth at bedtime TOPIRAMATE 100 MG TABS (TOPIRAMATE) take 3 tabs once daily VERAPAMIL HCL CR 120 MG CR-TABS (VERAPAMIL HCL) 1 by mouth once daily CLONAZEPAM 1 MG TABS (CLONAZEPAM) 1 by mouth two times a day NAPROXEN SODIUM 220 MG TABS (NAPROXEN SODIUM) 1 by mouth once daily TIZANIDINE HCL 4 MG TABS (TIZANIDINE HCL) 1 by mouth four times daily VIMPAT 100 MG TABS (LACOSAMIDE) 1 by mouth three times a day ZOLOFT 100 MG TABS (SERTRALINE HCL) 1.5 tabs by mouth daily YAZ 3-0.02 MG TABS (DROSPIRENONE-ETHINYL ESTRADIOL) 1 by mouth once daily FROVA 2.5 MG TABS (FROVATRIPTAN SUCCINATE) once daily CRESTOR 10 MG TABS (ROSUVASTATIN CALCIUM) 1 tab by mouth qhs ABILIFY 2 MG TABS (ARIPIPRAZOLE) 1 tab by mouth q PM ASTEPRO 0.15 % SOLN (AZELASTINE HCL) 2 sprays per nostril two times a day as needed BUTRANS 5 MCG/HR PTWK (BUPRENORPHINE)    Updated Prior Medication List: TRAMADOL HCL 50 MG  TABS (TRAMADOL HCL) four times daily TRAZODONE HCL 100 MG TABS (TRAZODONE HCL) 1-2 by mouth at bedtime TOPIRAMATE 100 MG TABS (TOPIRAMATE) take 3 tabs once daily VERAPAMIL HCL CR 120 MG CR-TABS (VERAPAMIL HCL) 1 by mouth once daily CLONAZEPAM 1 MG TABS (CLONAZEPAM) 1 by mouth two times a day NAPROXEN SODIUM 220 MG TABS (NAPROXEN SODIUM) 1 by mouth once daily TIZANIDINE HCL 4 MG TABS (TIZANIDINE HCL) 1 by mouth four times daily VIMPAT 100 MG TABS  (LACOSAMIDE) 1 by mouth three times a day ZOLOFT 100 MG TABS (SERTRALINE HCL) 1.5 tabs by mouth daily YAZ 3-0.02 MG TABS (DROSPIRENONE-ETHINYL ESTRADIOL) 1 by mouth once daily FROVA 2.5 MG TABS (FROVATRIPTAN SUCCINATE) once daily CRESTOR 10 MG TABS (ROSUVASTATIN CALCIUM) 1 tab by mouth qhs ABILIFY 2 MG TABS (ARIPIPRAZOLE) 1 tab by mouth q PM ASTEPRO 0.15 % SOLN (AZELASTINE HCL) 2 sprays per nostril two times a day as needed BUTRANS 5 MCG/HR PTWK (BUPRENORPHINE)  FERROUS SULFATE 325 (65 FE) MG TABS (FERROUS SULFATE)  CYANOCOBALAMIN 1000 MCG/ML SOLN (CYANOCOBALAMIN)   Current Allergies (reviewed today): ! METHADONE HCL (METHADONE HCL) ! PHENOBARBITAL (PHENOBARBITAL) ! AZITHROMYCIN (AZITHROMYCIN)History of Present Illness Chief Complaint: diarrhea x 4 days History of Present Illness: Patient w/ multiple medical problems involving reconstruction of her face and treatment of trigeminal nerve neuralgia. She reports having diarrhea for 4 days . She originally had nausea as wellbut that cleared and after 3 days she thought things were getting better until today when she started up again> She has had CDif before and can state this is not the C dif. when asked about blood in her stools she is on her cycle now.  Current Problems: DIARRHEA (ICD-787.91) ANKLE PAIN, LEFT (ICD-719.47) FATIGUE (ICD-780.79) CHRONIC KIDNEY DISEASE STAGE III (MODERATE) (ICD-585.3) RENAL INSUFFICIENCY (ICD-588.9) HYPERSOMNIA (ICD-780.54) OSTEOARTHRITIS (ICD-715.90) HYPERLIPIDEMIA (ICD-272.4) HEADACHE (ICD-784.0) DEPRESSION (ICD-311) ALLERGIC RHINITIS (ICD-477.9)   Current Meds TRAMADOL HCL 50 MG  TABS (TRAMADOL HCL) four times daily TRAZODONE HCL 100 MG TABS (TRAZODONE HCL) 1-2 by mouth at bedtime TOPIRAMATE 100 MG TABS (TOPIRAMATE) take 3 tabs once daily VERAPAMIL HCL CR 120 MG CR-TABS (VERAPAMIL HCL) 1 by mouth once daily CLONAZEPAM 1 MG TABS (CLONAZEPAM) 1 by mouth two times a day NAPROXEN SODIUM 220 MG TABS  (NAPROXEN SODIUM) 1 by mouth once daily TIZANIDINE HCL 4 MG TABS (TIZANIDINE HCL) 1 by mouth four times daily VIMPAT 100 MG TABS (LACOSAMIDE) 1 by mouth three times a day ZOLOFT 100 MG TABS (SERTRALINE HCL) 1.5 tabs by mouth daily YAZ 3-0.02 MG TABS (DROSPIRENONE-ETHINYL ESTRADIOL) 1 by mouth once daily FROVA 2.5 MG TABS (FROVATRIPTAN SUCCINATE) once daily CRESTOR 10 MG TABS (ROSUVASTATIN CALCIUM) 1 tab by mouth qhs ABILIFY 2 MG TABS (ARIPIPRAZOLE) 1 tab by mouth q PM ASTEPRO 0.15 % SOLN (AZELASTINE HCL) 2 sprays per nostril two times a day as needed BUTRANS 5 MCG/HR PTWK (BUPRENORPHINE)  FERROUS SULFATE 325 (65 FE) MG TABS (FERROUS SULFATE)  CYANOCOBALAMIN 1000 MCG/ML SOLN (CYANOCOBALAMIN)  IMODIUM A-D 2 MG TABS (LOPERAMIDE HCL) 1 by mouth q 6hrs as needed for diarrhea * PEPTO-BISMOL 262 MG TABS & ZANTAC 150MG  1 by mouth of each 3x aday for 3-5 days  REVIEW OF SYSTEMS Constitutional Symptoms       Complains of weight loss.     Denies fever, chills, night sweats, weight gain, and fatigue.  Eyes       Denies change in vision, eye pain, eye discharge, glasses, contact lenses, and eye surgery. Ear/Nose/Throat/Mouth       Denies hearing loss/aids, change in hearing, ear pain, ear discharge, dizziness, frequent runny nose, frequent nose bleeds, sinus problems, sore throat, hoarseness, and tooth pain or bleeding.  Respiratory       Denies dry cough, productive cough, wheezing, shortness of breath, asthma, bronchitis, and emphysema/COPD.  Cardiovascular       Denies murmurs, chest pain, and tires easily with exhertion.    Gastrointestinal       Complains of stomach pain, nausea/vomiting, and diarrhea.      Denies constipation, blood in bowel movements, and indigestion. Genitourniary       Denies painful urination, kidney stones, and loss of urinary control. Neurological       Denies paralysis, seizures, and fainting/blackouts. Musculoskeletal       Denies muscle pain, joint pain, joint  stiffness, decreased range of motion, redness, swelling, muscle weakness, and gout.  Skin       Denies bruising, unusual mles/lumps or sores, and hair/skin or nail changes.  Psych       Complains of anxiety/stress.      Denies mood changes, temper/anger issues, speech problems, depression, and sleep problems. Other Comments: pt c/o diarrhea, generalized abd cramping, and increased bowel sounds x 4days.no fever, no blood in stool.   Past History:  Family History: Last updated: 08/14/2011 father: CVA Mother: healthy sister healthy  Social History: Last updated: 08/14/2011 Lives alone. parents nearby no relationships on disability denies smoking  Alcohol use-yes 1-2 glasses per wk  Past Medical History: Allergic rhinitis Blood transfusion Depression Headache Hyperlipidemia Osteoarthritis R facial paralysis/ chronic pain syndrome.  (pain managment Dr Kendell Bane) G0 R trigeminal neuralgia.   hypotension  anemia  Past Surgical History: osteotomy facial surgery 1985  Family History: Reviewed history from 07/05/2009 and no changes required. father: CVA Mother: healthy sister healthy  Social History: Reviewed history from 07/05/2009 and no changes required. Lives alone. parents nearby no relationships on disability denies smoking  Alcohol use-yes 1-2 glasses per wk Physical Exam General appearance: well developed, well nourished, mild distress Head: assymetry of the facial muscle  and multiple scarring is present Eyes: conjunctivae and lidw/assymetry Abdomen: soft, non-tender without obvious organomegaly Back: no cva tendernesss Skin: no obvious rashes or lesions MSE: oriented to time, place, and person Assessment Problems:   ANKLE PAIN, LEFT (ICD-719.47) FATIGUE (ICD-780.79) CHRONIC KIDNEY DISEASE STAGE III (MODERATE) (ICD-585.3) RENAL INSUFFICIENCY (ICD-588.9) HYPERSOMNIA (ICD-780.54) OSTEOARTHRITIS (ICD-715.90) HYPERLIPIDEMIA (ICD-272.4) HEADACHE  (ICD-784.0) DEPRESSION (ICD-311) ALLERGIC RHINITIS (ICD-477.9) New Problems: DIARRHEA (ICD-787.91)   Patient Education: Patient and/or caregiver instructed in the following: rest fluids and Tylenol.  Plan New Medications/Changes: PEPTO-BISMOL 262 MG TABS & ZANTAC 150MG  1 by mouth of each 3x aday for 3-5 days  #QS x 1, 08/14/2011, Hassan Rowan MD IMODIUM A-D 2 MG TABS (LOPERAMIDE HCL) 1 by mouth q 6hrs as needed for diarrhea  #30 x 1, 08/14/2011, Hassan Rowan MD  New Orders: New Patient Level IV [16109] Follow Up: Follow up in 2-3 days if no improvement, Follow up on an as needed basis, Follow up with Primary Physician Follow Up: if not improving may neeed stools for O&P and C&C  The patient and/or caregiver has been counseled thoroughly with regard to medications prescribed including dosage, schedule, interactions, rationale for use, and possible side effects and they verbalize understanding.  Diagnoses and expected course of recovery discussed and will return if not improved as expected or if the condition worsens. Patient and/or caregiver verbalized understanding.  Prescriptions: PEPTO-BISMOL 262 MG TABS & ZANTAC 150MG  1 by mouth of each 3x aday for 3-5 days  #QS x 1   Entered and Authorized by:   Hassan Rowan MD   Signed by:   Hassan Rowan MD on 08/14/2011   Method used:   Printed then faxed to ...       [facility] [address] (retail)       [address]       [place], [place]  [address]       Ph: [phone] or [phone]       Fax: [phone]   RxID:   [id number] IMODIUM A-D 2 MG TABS (LOPERAMIDE HCL) 1 by mouth q 6hrs as needed for diarrhea  #30 x 1   Entered and Authorized by:   Hassan Rowan MD   Signed by:   Hassan Rowan MD on 08/14/2011   Method used:   Printed then faxed to ...       [facility] [address] (retail)       [address]       [place], [place]  [address]       Ph: [phone] or [phone]        Fax: [phone]   RxID:   [id number]   Patient Instructions: 1)  May need stool for O&P or C&S if not markedly imroving in 3-5 days 2)  Please schedule a follow-up appointment as needed. 3)  Please schedule an appointment with your primary doctor in :3-5 days.  4)  Current treatment today avoids narcotic treatment and antibiotics.  Orders Added: 1)  New Patient Level IV [16109]

## 2011-12-13 ENCOUNTER — Encounter: Payer: Self-pay | Admitting: Family Medicine

## 2011-12-18 ENCOUNTER — Ambulatory Visit: Payer: No Typology Code available for payment source | Admitting: Family Medicine

## 2011-12-27 ENCOUNTER — Other Ambulatory Visit: Payer: Self-pay | Admitting: *Deleted

## 2011-12-27 MED ORDER — SERTRALINE HCL 100 MG PO TABS: 100.0000 mg | ORAL_TABLET | Freq: Every day | ORAL | Status: DC

## 2011-12-27 MED ORDER — ROSUVASTATIN CALCIUM 10 MG PO TABS: 10.0000 mg | ORAL_TABLET | Freq: Every day | ORAL | Status: DC

## 2012-02-13 ENCOUNTER — Encounter: Payer: Self-pay | Admitting: *Deleted

## 2012-02-20 ENCOUNTER — Ambulatory Visit: Payer: No Typology Code available for payment source | Admitting: Family Medicine

## 2012-05-13 ENCOUNTER — Ambulatory Visit
Admission: RE | Admit: 2012-05-13 | Discharge: 2012-05-13 | Disposition: A | Discharge status: Home / Self Care | Payer: No Typology Code available for payment source | Source: Ambulatory Visit | Attending: Family Medicine | Admitting: Family Medicine

## 2012-05-13 DIAGNOSIS — Z1231 Encounter for screening mammogram for malignant neoplasm of breast: Secondary | ICD-10-CM

## 2012-06-03 ENCOUNTER — Ambulatory Visit: Payer: No Typology Code available for payment source

## 2012-06-11 ENCOUNTER — Other Ambulatory Visit: Payer: Self-pay | Admitting: *Deleted

## 2012-06-16 ENCOUNTER — Other Ambulatory Visit: Payer: Self-pay | Admitting: *Deleted

## 2012-06-16 MED ORDER — ARIPIPRAZOLE 2 MG PO TABS: 2.0000 mg | ORAL_TABLET | Freq: Every day | ORAL | Status: DC

## 2012-07-22 ENCOUNTER — Other Ambulatory Visit: Payer: Self-pay | Admitting: *Deleted

## 2012-07-22 ENCOUNTER — Ambulatory Visit (INDEPENDENT_AMBULATORY_CARE_PROVIDER_SITE_OTHER): Payer: No Typology Code available for payment source | Admitting: Family Medicine

## 2012-07-22 ENCOUNTER — Other Ambulatory Visit (HOSPITAL_COMMUNITY)
Admission: RE | Admit: 2012-07-22 | Discharge: 2012-07-22 | Disposition: A | Discharge status: Home / Self Care | Payer: No Typology Code available for payment source | Source: Ambulatory Visit | Attending: Family Medicine | Admitting: Family Medicine

## 2012-07-22 ENCOUNTER — Encounter: Payer: Self-pay | Admitting: Family Medicine

## 2012-07-22 VITALS — BP 121/78 | HR 94 | Ht 64.0 in | Wt 167.0 lb

## 2012-07-22 DIAGNOSIS — G500 Trigeminal neuralgia: Secondary | ICD-10-CM | POA: Insufficient documentation

## 2012-07-22 DIAGNOSIS — Z01419 Encounter for gynecological examination (general) (routine) without abnormal findings: Secondary | ICD-10-CM | POA: Insufficient documentation

## 2012-07-22 DIAGNOSIS — Z0000 Encounter for general adult medical examination without abnormal findings: Secondary | ICD-10-CM

## 2012-07-22 DIAGNOSIS — Z1151 Encounter for screening for human papillomavirus (HPV): Secondary | ICD-10-CM | POA: Insufficient documentation

## 2012-07-22 MED ORDER — ARIPIPRAZOLE 2 MG PO TABS: 2.0000 mg | ORAL_TABLET | Freq: Every day | ORAL | Status: DC

## 2012-07-22 MED ORDER — DROSPIRENONE-ETHINYL ESTRADIOL 3-0.02 MG PO TABS: 1.0000 | ORAL_TABLET | Freq: Every day | ORAL | Status: DC

## 2012-07-22 MED ORDER — VERAPAMIL HCL ER 180 MG PO CP24: 180.0000 mg | ORAL_CAPSULE | Freq: Every day | ORAL | Status: DC

## 2012-07-22 NOTE — Patient Instructions (Addendum)
We will call you with your lab results. If you don't here from us in about a week then please give us a call at [phone]. Start a regular exercise program and make sure you are eating a healthy diet Try to eat 4 servings of dairy a day . Your vaccines are up to date.   

## 2012-07-22 NOTE — Addendum Note (Signed)
Addended by: Wyline Beady on: 07/22/2012 03:30 PM   Modules accepted: Orders

## 2012-07-22 NOTE — Telephone Encounter (Signed)
refills  

## 2012-07-22 NOTE — Progress Notes (Signed)
  Subjective:     Desiree Wilson is a 46 y.o. female and is here for a comprehensive physical exam. The patient reports no problems. Mammogram was last month and was normal.  Hx of normal pap smears. Last was 07/2012.    History   Social History  . Marital Status: Single    Spouse Name: N/A    Number of Children: N/A  . Years of Education: N/A   Occupational History  . Not on file.   Social History Main Topics  . Smoking status: Former Smoker -- 6 years    Types: Cigarettes    Quit date: 12/31/2000  . Smokeless tobacco: Not on file  . Alcohol Use: 0.6 oz/week    1 Glasses of wine per week  . Drug Use: Not on file  . Sexually Active: Not on file   Other Topics Concern  . Not on file   Social History Narrative  . No narrative on file   Health Maintenance  Topic Date Due  . Influenza Vaccine  09/30/2012  . Pap Smear  07/23/2015  . Tetanus/tdap  08/10/2020    The following portions of the patient's history were reviewed and updated as appropriate: allergies, current medications, past family history, past social history, past surgical history and problem list.  Review of Systems A comprehensive review of systems was negative.   Objective:    BP 121/78  Pulse 94  Ht 5\' 4"  (1.626 m)  Wt 167 lb (75.751 kg)  BMI 28.67 kg/m2 General appearance: alert, cooperative and appears stated age Head: atraumatic, right side of face lifted with dec movement on the right Eyes: conj clear, EOMi,  PEERLA Ears: normal TM's and external ear canals both ears Nose: Nares normal. Septum midline. Mucosa normal. No drainage or sinus tenderness. Throat: lips, mucosa, and tongue normal; teeth and gums normal Neck: no adenopathy, no carotid bruit, no JVD, supple, symmetrical, trachea midline and thyroid not enlarged, symmetric, no tenderness/mass/nodules Back: symmetric, no curvature. ROM normal. No CVA tenderness. Lungs: clear to auscultation bilaterally Breasts: normal appearance, no masses  or tenderness Heart: regular rate and rhythm, S1, S2 normal, no murmur, click, rub or gallop Abdomen: soft, non-tender; bowel sounds normal; no masses,  no organomegaly Pelvic: cervix normal in appearance, external genitalia normal, no adnexal masses or tenderness, no cervical motion tenderness, rectovaginal septum normal, uterus normal size, shape, and consistency and vagina normal without discharge Extremities: extremities normal, atraumatic, no cyanosis or edema Pulses: 2+ and symmetric Skin: Skin color, texture, turgor normal. No rashes or lesions Lymph nodes: Cervical, supraclavicular, and axillary nodes normal. Neurologic: Alert and oriented X 3, normal strength and tone. Normal symmetric reflexes. Normal coordination and gait    Assessment:    Healthy female exam.      Plan:     See After Visit Summary for Counseling Recommendations  Start a regular exercise program and make sure you are eating a healthy diet Try to eat 4 servings of dairy a day  Your vaccines are up to date.  Due for screening labs.

## 2012-07-23 ENCOUNTER — Other Ambulatory Visit: Payer: Self-pay | Admitting: Family Medicine

## 2012-07-23 MED ORDER — VERAPAMIL HCL ER 180 MG PO TBCR: 180.0000 mg | EXTENDED_RELEASE_TABLET | Freq: Every day | ORAL | Status: DC

## 2012-07-29 ENCOUNTER — Telehealth: Payer: Self-pay | Admitting: Family Medicine

## 2012-07-29 NOTE — Telephone Encounter (Signed)
Call pt: her verapmil can increase the blood levels of the muscle relaxer tizanidine. Got letter from her pharmacist. Rec cut the tizanidine in half when use it.

## 2012-07-31 NOTE — Telephone Encounter (Signed)
Left message on vm with reccomendations 

## 2012-08-04 ENCOUNTER — Other Ambulatory Visit: Payer: Self-pay | Admitting: *Deleted

## 2012-08-04 MED ORDER — ARIPIPRAZOLE 2 MG PO TABS: 2.0000 mg | ORAL_TABLET | Freq: Every day | ORAL | Status: DC

## 2012-08-04 MED ORDER — VERAPAMIL HCL ER 180 MG PO TBCR: 180.0000 mg | EXTENDED_RELEASE_TABLET | Freq: Every day | ORAL | Status: DC

## 2012-09-02 ENCOUNTER — Other Ambulatory Visit: Payer: Self-pay | Admitting: *Deleted

## 2012-09-02 MED ORDER — VERAPAMIL HCL ER 180 MG PO TBCR: 180.0000 mg | EXTENDED_RELEASE_TABLET | Freq: Every day | ORAL | Status: DC

## 2012-10-01 ENCOUNTER — Telehealth: Payer: Self-pay | Admitting: *Deleted

## 2012-10-01 MED ORDER — ARIPIPRAZOLE 2 MG PO TABS: 2.0000 mg | ORAL_TABLET | Freq: Every day | ORAL | Status: DC

## 2012-10-02 ENCOUNTER — Other Ambulatory Visit: Payer: Self-pay | Admitting: *Deleted

## 2012-10-02 MED ORDER — SERTRALINE HCL 100 MG PO TABS: 100.0000 mg | ORAL_TABLET | Freq: Every day | ORAL | Status: DC

## 2012-10-03 ENCOUNTER — Other Ambulatory Visit: Payer: Self-pay | Admitting: Family Medicine

## 2012-10-03 MED ORDER — SERTRALINE HCL 100 MG PO TABS: 150.0000 mg | ORAL_TABLET | Freq: Every day | ORAL | Status: DC

## 2012-10-27 ENCOUNTER — Other Ambulatory Visit: Payer: Self-pay | Admitting: *Deleted

## 2012-10-27 MED ORDER — VERAPAMIL HCL ER 180 MG PO TBCR: 180.0000 mg | EXTENDED_RELEASE_TABLET | Freq: Every day | ORAL | Status: DC

## 2012-11-14 LAB — BASIC METABOLIC PANEL
BUN: 21 mg/dL (ref 4–21)
Creatinine: 1.3 mg/dL — AB (ref 0.5–1.1)
Glucose: 77 mg/dL

## 2012-11-20 ENCOUNTER — Encounter: Payer: Self-pay | Admitting: *Deleted

## 2013-01-30 ENCOUNTER — Other Ambulatory Visit: Payer: Self-pay | Admitting: Family Medicine

## 2013-01-30 NOTE — Telephone Encounter (Signed)
Pt wants this refilled.  Per her chart you said this medication can increase her blood levels the muscle relaxer tizanidine.  Is this ok to fill? Please advise

## 2013-02-02 ENCOUNTER — Other Ambulatory Visit: Payer: Self-pay | Admitting: *Deleted

## 2013-02-02 MED ORDER — VERAPAMIL HCL ER 180 MG PO TBCR: 180.0000 mg | EXTENDED_RELEASE_TABLET | Freq: Every day | ORAL | Status: DC

## 2013-02-03 ENCOUNTER — Other Ambulatory Visit: Payer: Self-pay | Admitting: *Deleted

## 2013-02-03 ENCOUNTER — Other Ambulatory Visit: Payer: Self-pay | Admitting: Family Medicine

## 2013-02-03 MED ORDER — ROSUVASTATIN CALCIUM 10 MG PO TABS: 10.0000 mg | ORAL_TABLET | Freq: Every day | ORAL | Status: DC

## 2013-03-02 ENCOUNTER — Other Ambulatory Visit: Payer: Self-pay | Admitting: Family Medicine

## 2013-03-12 ENCOUNTER — Ambulatory Visit (INDEPENDENT_AMBULATORY_CARE_PROVIDER_SITE_OTHER): Payer: No Typology Code available for payment source | Admitting: Family Medicine

## 2013-03-12 ENCOUNTER — Encounter: Payer: Self-pay | Admitting: Family Medicine

## 2013-03-12 VITALS — BP 120/74 | HR 81 | Ht 65.0 in | Wt 198.0 lb

## 2013-03-12 DIAGNOSIS — Z5181 Encounter for therapeutic drug level monitoring: Secondary | ICD-10-CM

## 2013-03-12 DIAGNOSIS — I10 Essential (primary) hypertension: Secondary | ICD-10-CM

## 2013-03-12 DIAGNOSIS — G500 Trigeminal neuralgia: Secondary | ICD-10-CM

## 2013-03-12 DIAGNOSIS — E559 Vitamin D deficiency, unspecified: Secondary | ICD-10-CM

## 2013-03-12 DIAGNOSIS — F329 Major depressive disorder, single episode, unspecified: Secondary | ICD-10-CM

## 2013-03-12 DIAGNOSIS — E785 Hyperlipidemia, unspecified: Secondary | ICD-10-CM

## 2013-03-12 MED ORDER — ARIPIPRAZOLE 2 MG PO TABS: 2.0000 mg | ORAL_TABLET | Freq: Every day | ORAL | Status: DC

## 2013-03-12 MED ORDER — DROSPIRENONE-ETHINYL ESTRADIOL 3-0.02 MG PO TABS: 1.0000 | ORAL_TABLET | Freq: Every day | ORAL | Status: DC

## 2013-03-12 MED ORDER — SERTRALINE HCL 100 MG PO TABS: 150.0000 mg | ORAL_TABLET | Freq: Every day | ORAL | Status: DC

## 2013-03-12 MED ORDER — ROSUVASTATIN CALCIUM 10 MG PO TABS: 10.0000 mg | ORAL_TABLET | Freq: Every day | ORAL | Status: DC

## 2013-03-12 NOTE — Progress Notes (Signed)
  Subjective:    Patient ID: Desiree Wilson, female    DOB: 1966-06-26, 47 y.o.   MRN: [id number]  HPI HTN -  Pt denies chest pain, SOB, dizziness, or heart palpitations.  Taking meds as directed w/o problems.  Denies medication side effects.  She is on verapamil and does have chronic kidney disease stage III related to her hypertension.  Trigmenal neuralgia she's currently on several medications and followed by neurology. She has recently started exercising with water aerobics and says it actually seems to improve her pain levels.  Hyerlipidemia - Still on crestor. Tolreating well. No myalgias.  CKD 3- following with [facility]ine[facility] doctor.  Last visit was in November. Kidney fraction was stable at that time. She follows once yearly.   Review of Systems     Objective:   Physical Exam  Constitutional: She is oriented to person, place, and time. She appears well-developed and well-nourished.  HENT:  Head: Normocephalic and atraumatic.  Cardiovascular: Normal rate, regular rhythm and normal heart sounds.   Pulmonary/Chest: Effort normal and breath sounds normal.  Musculoskeletal: She exhibits no edema.  Neurological: She is alert and oriented to person, place, and time.  Skin: Skin is warm and dry.  Psychiatric: She has a normal mood and affect. Her behavior is normal.          Assessment & Plan:  HTN - well controlled.  Continue current regimen congratulated her on starting an exercise program. I think this is absolutely fantastic. I want her to make sure that she's eating a low salt diet. due to recheck lipids. She's tolerating her statin well without any side effects or problems.  Depression - Since has been on abilify for 3 years will check a1C since inc risk of diabetes.   Trigeminal neuralgia-overall her pain is under fair control. She has noted that her recent exercise has actually helped her.  Chronic kidney disease stage III-followed yearly with nephrology. Next appointment is  seen in November. We will recheck her kidney function today. I reminded her that should be checked at least twice year.  Schedule well woman exam for July.

## 2013-03-27 ENCOUNTER — Emergency Department (HOSPITAL_BASED_OUTPATIENT_CLINIC_OR_DEPARTMENT_OTHER)
Admission: EM | Admit: 2013-03-27 | Discharge: 2013-03-27 | Disposition: A | Discharge status: Home / Self Care | Payer: No Typology Code available for payment source | Attending: Emergency Medicine | Admitting: Emergency Medicine

## 2013-03-27 ENCOUNTER — Encounter (HOSPITAL_BASED_OUTPATIENT_CLINIC_OR_DEPARTMENT_OTHER): Payer: Self-pay

## 2013-03-27 ENCOUNTER — Other Ambulatory Visit: Payer: Self-pay

## 2013-03-27 ENCOUNTER — Encounter: Payer: Self-pay | Admitting: Physician Assistant

## 2013-03-27 ENCOUNTER — Other Ambulatory Visit: Payer: Self-pay | Admitting: Physician Assistant

## 2013-03-27 ENCOUNTER — Ambulatory Visit (INDEPENDENT_AMBULATORY_CARE_PROVIDER_SITE_OTHER): Payer: No Typology Code available for payment source | Admitting: Physician Assistant

## 2013-03-27 VITALS — BP 105/59 | HR 75 | Wt 202.0 lb

## 2013-03-27 DIAGNOSIS — R42 Dizziness and giddiness: Secondary | ICD-10-CM | POA: Insufficient documentation

## 2013-03-27 DIAGNOSIS — R110 Nausea: Secondary | ICD-10-CM | POA: Insufficient documentation

## 2013-03-27 DIAGNOSIS — I82612 Acute embolism and thrombosis of superficial veins of left upper extremity: Secondary | ICD-10-CM

## 2013-03-27 DIAGNOSIS — E785 Hyperlipidemia, unspecified: Secondary | ICD-10-CM | POA: Insufficient documentation

## 2013-03-27 DIAGNOSIS — I82619 Acute embolism and thrombosis of superficial veins of unspecified upper extremity: Secondary | ICD-10-CM

## 2013-03-27 DIAGNOSIS — Z87891 Personal history of nicotine dependence: Secondary | ICD-10-CM | POA: Insufficient documentation

## 2013-03-27 DIAGNOSIS — Z79899 Other long term (current) drug therapy: Secondary | ICD-10-CM | POA: Insufficient documentation

## 2013-03-27 DIAGNOSIS — F3289 Other specified depressive episodes: Secondary | ICD-10-CM | POA: Insufficient documentation

## 2013-03-27 DIAGNOSIS — F329 Major depressive disorder, single episode, unspecified: Secondary | ICD-10-CM | POA: Insufficient documentation

## 2013-03-27 DIAGNOSIS — I809 Phlebitis and thrombophlebitis of unspecified site: Secondary | ICD-10-CM | POA: Insufficient documentation

## 2013-03-27 DIAGNOSIS — Z791 Long term (current) use of non-steroidal anti-inflammatories (NSAID): Secondary | ICD-10-CM | POA: Insufficient documentation

## 2013-03-27 DIAGNOSIS — Z862 Personal history of diseases of the blood and blood-forming organs and certain disorders involving the immune mechanism: Secondary | ICD-10-CM | POA: Insufficient documentation

## 2013-03-27 DIAGNOSIS — Z8679 Personal history of other diseases of the circulatory system: Secondary | ICD-10-CM | POA: Insufficient documentation

## 2013-03-27 DIAGNOSIS — Z8669 Personal history of other diseases of the nervous system and sense organs: Secondary | ICD-10-CM | POA: Insufficient documentation

## 2013-03-27 DIAGNOSIS — M1990 Unspecified osteoarthritis, unspecified site: Secondary | ICD-10-CM | POA: Insufficient documentation

## 2013-03-27 LAB — COMPLETE METABOLIC PANEL WITH GFR
ALT: 22 U/L (ref 0–35)
AST: 20 U/L (ref 0–37)
Albumin: 3.5 g/dL (ref 3.5–5.2)
Calcium: 9.1 mg/dL (ref 8.4–10.5)
Chloride: 109 mEq/L (ref 96–112)
Creat: 1.34 mg/dL — ABNORMAL HIGH (ref 0.50–1.10)
Potassium: 4.1 mEq/L (ref 3.5–5.3)
Sodium: 140 mEq/L (ref 135–145)
Total Protein: 6.3 g/dL (ref 6.0–8.3)

## 2013-03-27 LAB — BASIC METABOLIC PANEL
BUN: 21 mg/dL (ref 6–23)
Calcium: 9.3 mg/dL (ref 8.4–10.5)
GFR calc non Af Amer: 48 mL/min — ABNORMAL LOW (ref >90)
Glucose, Bld: 87 mg/dL (ref 70–99)

## 2013-03-27 LAB — HEMOGLOBIN A1C: Mean Plasma Glucose: 111 mg/dL (ref <117)

## 2013-03-27 LAB — LIPID PANEL
LDL Cholesterol: 81 mg/dL (ref 0–99)
VLDL: 28 mg/dL (ref 0–40)

## 2013-03-27 MED ORDER — AMBULATORY NON FORMULARY MEDICATION: Status: DC

## 2013-03-27 MED ORDER — DICLOFENAC SODIUM 50 MG PO TBEC: 50.0000 mg | DELAYED_RELEASE_TABLET | Freq: Three times a day (TID) | ORAL | Status: DC

## 2013-03-27 MED ORDER — OXYCODONE-ACETAMINOPHEN 5-325 MG PO TABS
1.0000 | ORAL_TABLET | Freq: Once | ORAL | Status: AC
  Administered 2013-03-27: 1 via ORAL
  Filled 2013-03-27 (×2): qty 1

## 2013-03-27 NOTE — Patient Instructions (Addendum)
Start using ace bandage and/or compression sleve. Start Voltaren 2-3 times a day for 7-10 days. Use warm heat and arm elevation. Can take a ASA baby 81 mg daily in combination.   Call if 7-10 days for follow up doppler or if symptoms worsening.

## 2013-03-27 NOTE — Progress Notes (Signed)
  Subjective:    Patient ID: Desiree Wilson, female    DOB: October 27, 1966, 47 y.o.   MRN: [id number]  HPI Patient is a 47 year old female who presents to the clinic in hand with copy of ultrasound of left forearm. She was seen by pain clinic on 03/17/13 and giving an infusion of ketamine that she is shortly takes on a regular basis. They did experience some problems with IV do to small veins. About 1 week after infusion she noticed her arm was getting hard, very tender to touch, swelling and numbness and tingling were going down her fingers. She was also very weak in her left extremity. She called pain clinic and they ordered an venous Doppler of upper extremity. It was positive for cephalic vein thrombosis. She has not treated with any medication or heat/ice.    Review of Systems     Objective:   Physical Exam  Constitutional: She is oriented to person, place, and time. She appears well-developed and well-nourished.  HENT:  Head: Normocephalic and atraumatic.  Cardiovascular: Normal rate, regular rhythm and normal heart sounds.   Pulmonary/Chest: Effort normal and breath sounds normal.  Neurological: She is alert and oriented to person, place, and time.  Skin:  Left forearm is very tight and swollen with a slight bluish tan along the superficial vein that appears to be thrombosed. There is minimal pain with palpation along the forearm and up into the elbow. Arm was not warm to touch and there was not a significant amount of erythema.  Psychiatric: She has a normal mood and affect. Her behavior is normal.          Assessment & Plan:  Superficial venous thrombosis of the cephalic vein-reassured patient that there was not a deep vein thrombosis but rather a superficial vein compresses and which treatment is much different. protocol is to try NSAIDs for 7-10 days along with heat and compression. Patient was given an Ace wrap in the office for compression. She was also given a prescription to  get a compression sleeve at the pharmacy. She was given a prescription for Voltaren to take 2-3 times a day for pain, swelling, inflammation. Encouraged patient to at least take for 10 days and then only as needed. Patient advised that she could go ahead and start her baby aspirin daily. Encouraged to use elevation of left arm and heat as much as possible. Discuss with patient that if arm continues to get red, increased swelling or worsening pain to give office a call. If continuing to improve we'll do a repeat venous ultrasound of left forearm after 7-10 days of symptomatic treatment.

## 2013-03-27 NOTE — ED Notes (Addendum)
Unable to obtain blood specimen after several attempts. Dr. Lynelle Doctor will speak to pt and sts to make no further attempts to collect.

## 2013-03-27 NOTE — ED Notes (Signed)
Pt states that she had infusion of ketamine yesterday for pain management at [facility] pain institute, pt developed blood clot from this infusion in the L lower arm.  Pt states that today she was at [facility] at [place] for PCP visit and they placed an ace wrap on the L lower arm and they prescribed her an antiinflammatory medication (pt has not started this as of yet).  Pt states that she was at another MD office with her husband and became dizzy, nauseous, and lightheaded and they advised her to come to ED.

## 2013-03-27 NOTE — ED Notes (Signed)
Unable to collect blood specimen. Dora, RT at bedside for attempt

## 2013-03-27 NOTE — ED Provider Notes (Signed)
History    CSN: [id number] Arrival date & time 03/27/13  1445 First MD Initiated Contact with Patient 03/27/13 1705     Chief Complaint  Patient presents with  . Arm Swelling   HPI The patient has a history of trigeminal neuralgia. For this illness, she receives periodic ketamine infusions. Patient had an effusion the other day. Following that she began experiencing swelling and discomfort in her left arm. Patient went to her pain management Dr. and they performed a venous Doppler study. Him to have a superficial thrombolysis in this iliac vein without evidence of deep venous thrombosis.  Patient was prescribed Voltaren and aspirin by her doctor. The patient happened to be the medical facility where her husband was getting treated. She became  dizzy, nauseated and lightheaded while at the facility temporarily.  Patient was continued to have pain in her left arm that radiated up towards the proximal aspect in her shoulder area.  Patient states her arm still hurting. She denies any difficulty breathing at this time. She has not noticed any redness, fever or other new complaints. Past Medical History  Diagnosis Date  . Allergy     rhinitis  . Depression   . Headache   . Hyperlipidemia   . Osteoarthritis   . Facial paralysis     right face/ chronic pain syndrome (pain management Dr Kendell Bane)  . Trigeminal neuralgia     right  . Hypotension   . Blood transfusion   . Anemia     Past Surgical History  Procedure Laterality Date  . Osteotomy    . Mandible surgery    . Plastic facial surgery      Family History  Problem Relation Age of Onset  . Stroke Father     History  Substance Use Topics  . Smoking status: Former Smoker -- 6 years    Types: Cigarettes    Quit date: 12/31/2000  . Smokeless tobacco: Not on file  . Alcohol Use: 0.6 oz/week    1 Glasses of wine per week    OB History   Grav Para Term Preterm Abortions TAB SAB Ect Mult Living                  Review of  Systems  All other systems reviewed and are negative.    Allergies  Azithromycin; Methadone hcl; and Phenobarbital  Home Medications   Current Outpatient Rx  Name  Route  Sig  Dispense  Refill  . AMBULATORY NON FORMULARY MEDICATION      Compression sleeve for forearm.   1 Device   0   . ARIPiprazole (ABILIFY) 2 MG tablet   Oral   Take 1 tablet (2 mg total) by mouth daily.   30 tablet   3   . clonazePAM (KLONOPIN) 1 MG tablet   Oral   Take 1 tablet (1 mg total) by mouth 2 (two) times daily.   30 tablet   2   . diclofenac (VOLTAREN) 50 MG EC tablet      TAKE 1 TABLET BY MOUTH THREE TIMES DAILY   270 tablet   0     **Patient requests 90 days supply**   . doxepin (SINEQUAN) 25 MG capsule   Oral   Take 25 mg by mouth at bedtime. Take 1-3 caps at bedtime as needed.         . drospirenone-ethinyl estradiol (YAZ,GIANVI,LORYNA) 3-0.02 MG tablet   Oral   Take 1 tablet by mouth daily.  90 tablet   3   . FeFum-FePoly-FA-B Cmp-C-Biot (FOLIVANE-PLUS) CAPS   Oral   Take by mouth daily.         . frovatriptan (FROVA) 2.5 MG tablet   Oral   Take 2.5 mg by mouth daily.           . Lacosamide (VIMPAT) 100 MG TABS   Oral   Take by mouth 3 (three) times daily.           . rosuvastatin (CRESTOR) 10 MG tablet   Oral   Take 1 tablet (10 mg total) by mouth daily.   90 tablet   2   . sertraline (ZOLOFT) 100 MG tablet   Oral   Take 1.5 tablets (150 mg total) by mouth daily.   135 tablet   2   . tiZANidine (ZANAFLEX) 4 MG tablet   Oral   Take 4 mg by mouth as needed.          . topiramate (TOPAMAX) 100 MG tablet   Oral   Take by mouth. Take 3 tabs once daily          . traMADol (ULTRAM) 50 MG tablet   Oral   Take 50 mg by mouth 4 (four) times daily.           . traZODone (DESYREL) 100 MG tablet   Oral   Take 100 mg by mouth at bedtime. Take 1-2 at bedtime          . verapamil (CALAN-SR) 180 MG CR tablet   Oral   Take 1 tablet (180 mg  total) by mouth daily.   90 tablet   0     **Patient requests 90 days supply**     BP 131/69  Pulse 81  Temp(Src) 98 F (36.7 C) (Oral)  Resp 20  Ht 5\' 5"  (1.651 m)  Wt 197 lb (89.359 kg)  BMI 32.78 kg/m2  SpO2 100%  LMP 03/23/2013  Physical Exam  Nursing note and vitals reviewed. Constitutional: She appears well-developed and well-nourished. No distress.  HENT:  Head: Normocephalic and atraumatic.  Right Ear: External ear normal.  Left Ear: External ear normal.  Eyes: Conjunctivae are normal. Right eye exhibits no discharge. Left eye exhibits no discharge. No scleral icterus.  Neck: Neck supple. No tracheal deviation present.  Cardiovascular: Normal rate, regular rhythm and intact distal pulses.   Pulmonary/Chest: Effort normal and breath sounds normal. No stridor. No respiratory distress. She has no wheezes. She has no rales.  Abdominal: Soft. Bowel sounds are normal. She exhibits no distension. There is no tenderness. There is no rebound and no guarding.  Musculoskeletal: She exhibits tenderness. She exhibits no edema.  No erythema, or palpable cords of the left upper extremity, compartments are soft, 2+ radial pulse distally  Neurological: She is alert. She has normal strength. No sensory deficit. Cranial nerve deficit:  no gross defecits noted. She exhibits normal muscle tone. She displays no seizure activity. Coordination normal.  Right facial palsy  Skin: Skin is warm and dry. No rash noted.  Psychiatric: She has a normal mood and affect.    ED Course  Procedures (including critical care time) EKG Rate 98 Normal sinus rhythm, normal axis, normal intervals, normal T waves Labs Reviewed  BASIC METABOLIC PANEL - Abnormal; Notable for the following:    CO2 18 (*)    Creatinine, Ser 1.30 (*)    GFR calc non Af Amer 48 (*)    GFR calc Af  Amer 56 (*)    All other components within normal limits  CBC WITH DIFFERENTIAL   the patient's CBC clotted twice. No results  found.   1. Superficial thrombophlebitis       MDM  The patient in the emergency room the patient an episode of feeling lightheaded associated with arm discomfort. Patient has known case of superficial thrombophlebitis without deep venous thrombosis. She did have the ultrasound report with her.  Patient was monitored in emergent apartment and no further symptoms. At this point she's not showing evidence of any cardiac dysrhythmia. I doubt acute cardiac syndrome. Pulmonary embolism unlikely as she does not have a deep venous thrombosis but just a superficial thrombophlebitis        Celene Kras, MD 03/27/13 2030

## 2013-04-02 ENCOUNTER — Encounter: Payer: Self-pay | Admitting: *Deleted

## 2013-07-02 ENCOUNTER — Other Ambulatory Visit: Payer: Self-pay | Admitting: Family Medicine

## 2013-07-02 DIAGNOSIS — Z1231 Encounter for screening mammogram for malignant neoplasm of breast: Secondary | ICD-10-CM

## 2013-07-14 ENCOUNTER — Ambulatory Visit (INDEPENDENT_AMBULATORY_CARE_PROVIDER_SITE_OTHER): Payer: No Typology Code available for payment source

## 2013-07-14 DIAGNOSIS — Z1231 Encounter for screening mammogram for malignant neoplasm of breast: Secondary | ICD-10-CM

## 2013-07-17 ENCOUNTER — Encounter: Payer: Self-pay | Admitting: Family Medicine

## 2013-07-17 ENCOUNTER — Ambulatory Visit (INDEPENDENT_AMBULATORY_CARE_PROVIDER_SITE_OTHER): Payer: No Typology Code available for payment source

## 2013-07-17 ENCOUNTER — Ambulatory Visit (INDEPENDENT_AMBULATORY_CARE_PROVIDER_SITE_OTHER): Payer: No Typology Code available for payment source | Admitting: Family Medicine

## 2013-07-17 VITALS — BP 119/71 | HR 102 | Ht 65.0 in | Wt 194.0 lb

## 2013-07-17 DIAGNOSIS — M25551 Pain in right hip: Secondary | ICD-10-CM

## 2013-07-17 DIAGNOSIS — M25559 Pain in unspecified hip: Secondary | ICD-10-CM

## 2013-07-17 DIAGNOSIS — R21 Rash and other nonspecific skin eruption: Secondary | ICD-10-CM

## 2013-07-17 MED ORDER — CEPHALEXIN 500 MG PO CAPS: 500.0000 mg | ORAL_CAPSULE | Freq: Three times a day (TID) | ORAL | Status: DC

## 2013-07-17 NOTE — Progress Notes (Signed)
  Subjective:    Patient ID: Desiree Wilson, female    DOB: 04-05-1966, 47 y.o.   MRN: [id number]  HPI Right groin crease pain x4 weeks. Not sure what caused it. Not sure if happened during water aerobics.  She has been icing it. She has been trying anti-inflammatories without relief. Pain did over more to the upper thigh. Then started using her tramadol for the pain.  Painful to stand up and walk on it now.  Then pain started shooting down inner thigh to her foot. Worse in AM and PM. No swelling.  No tenderness. No fever, has had some sweats.  Also noticed rash along the groin crease that she describes as pimples. She says they've been tender to touch and she has popped them all. She says it constantly hurts occasionally will get a sharp pain intermittently. No real alleviating symptoms. She has not tried a muscle relaxer but does have some Zanaflex at home.   Review of Systems     Objective:   Physical Exam  Constitutional: She appears well-developed and well-nourished.  HENT:  Head: Normocephalic and atraumatic.  Musculoskeletal:  Right hip with normal flexion and extension. Hip strength is 5 out of 5. She did have some discomfort with internal rotation but no significant discomfort with external rotation. She's a little bit tender along the groin crease. She does have about 5 papules that have been clearly popped and drained. No active drainage. No open wounds at the moment. They appear to be healing.  Skin: Skin is warm and dry.  Psychiatric: She has a normal mood and affect. Her behavior is normal.          Assessment & Plan:  Right hip pain - consider shingles. She's had pain for 4 weeks and then developed a rash a week after having pain. She has popped all the pustules so unable to get a swab and sample today. It could be shingles. Also consider right hip osteoarthritis. Will get x-rays today. She did have some discomfort with internal rotation of the hip but none with external.  It did not recreate a sharp pain that she's been expressing. She's been having a lot of pain with standing up and putting pressure on it. Also consider osteonecrosis of the hip. Because she has had some chills of the rash would like to get a CBC with differential her as well. Recommend trial of her muscle relaxer over the weekend to see if this helps her pain. She can discontinue the anti-inflammatory since has not been helpful. Then consider calling in something stronger if she's not improving.   One of the pustules did have induration, maybe some early cellulitis or abscess. Will call in keflex.

## 2013-07-18 LAB — CBC WITH DIFFERENTIAL/PLATELET
Basophils Relative: 0 % (ref 0–1)
Eosinophils Absolute: 0.2 10*3/uL (ref 0.0–0.7)
Eosinophils Relative: 2 % (ref 0–5)
Hemoglobin: 13.7 g/dL (ref 12.0–15.0)
MCH: 27.7 pg (ref 26.0–34.0)
MCHC: 33.5 g/dL (ref 30.0–36.0)
MCV: 82.8 fL (ref 78.0–100.0)
Monocytes Relative: 4 % (ref 3–12)
Neutrophils Relative %: 73 % (ref 43–77)

## 2013-07-20 ENCOUNTER — Telehealth: Payer: Self-pay | Admitting: *Deleted

## 2013-07-20 ENCOUNTER — Other Ambulatory Visit: Payer: Self-pay | Admitting: *Deleted

## 2013-07-20 ENCOUNTER — Other Ambulatory Visit: Payer: Self-pay | Admitting: Family Medicine

## 2013-07-20 DIAGNOSIS — I10 Essential (primary) hypertension: Secondary | ICD-10-CM

## 2013-07-20 DIAGNOSIS — R102 Pelvic and perineal pain: Secondary | ICD-10-CM

## 2013-07-20 NOTE — Telephone Encounter (Signed)
Called patient insurance company- [employer] and per insurance no authorization is required for any services when the pateint has the [facility] plan. Carolyn notified at [facility]. Imaging. Barry Dienes, LPN

## 2013-07-21 ENCOUNTER — Ambulatory Visit (INDEPENDENT_AMBULATORY_CARE_PROVIDER_SITE_OTHER): Payer: No Typology Code available for payment source

## 2013-07-21 DIAGNOSIS — N949 Unspecified condition associated with female genital organs and menstrual cycle: Secondary | ICD-10-CM

## 2013-07-21 LAB — BASIC METABOLIC PANEL
BUN: 17 mg/dL (ref 6–23)
Calcium: 9.8 mg/dL (ref 8.4–10.5)
Creat: 1.23 mg/dL — ABNORMAL HIGH (ref 0.50–1.10)
Glucose, Bld: 122 mg/dL — ABNORMAL HIGH (ref 70–99)
Sodium: 141 mEq/L (ref 135–145)

## 2013-07-22 ENCOUNTER — Telehealth: Payer: Self-pay | Admitting: Family Medicine

## 2013-07-22 NOTE — Telephone Encounter (Signed)
Patient called and stated yall have been playing phone tag and she request to know if you can call her on [phone] phone number instead of her cell. Thanks

## 2013-07-22 NOTE — Telephone Encounter (Signed)
Pt called and results given for CT and Abx are helping she has appt for CPE on Friday .[facility]'

## 2013-07-24 ENCOUNTER — Other Ambulatory Visit (HOSPITAL_COMMUNITY)
Admission: RE | Admit: 2013-07-24 | Discharge: 2013-07-24 | Disposition: A | Discharge status: Home / Self Care | Payer: No Typology Code available for payment source | Source: Ambulatory Visit | Attending: Family Medicine | Admitting: Family Medicine

## 2013-07-24 ENCOUNTER — Ambulatory Visit (INDEPENDENT_AMBULATORY_CARE_PROVIDER_SITE_OTHER): Payer: No Typology Code available for payment source | Admitting: Family Medicine

## 2013-07-24 ENCOUNTER — Other Ambulatory Visit: Payer: Self-pay | Admitting: Family Medicine

## 2013-07-24 ENCOUNTER — Encounter: Payer: Self-pay | Admitting: Family Medicine

## 2013-07-24 VITALS — BP 121/80 | HR 93 | Ht 65.0 in | Wt 191.0 lb

## 2013-07-24 DIAGNOSIS — Z0000 Encounter for general adult medical examination without abnormal findings: Secondary | ICD-10-CM

## 2013-07-24 DIAGNOSIS — Z01419 Encounter for gynecological examination (general) (routine) without abnormal findings: Secondary | ICD-10-CM

## 2013-07-24 DIAGNOSIS — N289 Disorder of kidney and ureter, unspecified: Secondary | ICD-10-CM

## 2013-07-24 DIAGNOSIS — B373 Candidiasis of vulva and vagina: Secondary | ICD-10-CM

## 2013-07-24 DIAGNOSIS — I10 Essential (primary) hypertension: Secondary | ICD-10-CM

## 2013-07-24 DIAGNOSIS — E559 Vitamin D deficiency, unspecified: Secondary | ICD-10-CM

## 2013-07-24 LAB — BASIC METABOLIC PANEL WITH GFR
CO2: 19 mEq/L (ref 19–32)
Calcium: 9.4 mg/dL (ref 8.4–10.5)
Creat: 1.38 mg/dL — ABNORMAL HIGH (ref 0.50–1.10)
GFR, Est African American: 53 mL/min — ABNORMAL LOW
GFR, Est Non African American: 46 mL/min — ABNORMAL LOW
Sodium: 138 mEq/L (ref 135–145)

## 2013-07-24 MED ORDER — FLUCONAZOLE 150 MG PO TABS: 150.0000 mg | ORAL_TABLET | Freq: Once | ORAL | Status: DC

## 2013-07-24 NOTE — Progress Notes (Signed)
Subjective:     Desiree Wilson is a 47 y.o. female and is here for a comprehensive physical exam. The patient reports problems - Says her right hip is much better. Still has 5 more days on ABX. She has been trying to work on her weight. She has cut back on her soda and is doing water aerobics. She has lost 7 lbs in the last 4 months.    History   Social History  . Marital Status: Single    Spouse Name: N/A    Number of Children: N/A  . Years of Education: N/A   Occupational History  . Not on file.   Social History Main Topics  . Smoking status: Former Smoker -- 6 years    Types: Cigarettes    Quit date: 12/31/2000  . Smokeless tobacco: Not on file  . Alcohol Use: 0.6 oz/week    1 Glasses of wine per week  . Drug Use: Not on file  . Sexually Active: Yes -- Female partner(s)   Other Topics Concern  . Not on file   Social History Narrative   Water aerobics several days per week. Lives with her boyfriend.    Health Maintenance  Topic Date Due  . Influenza Vaccine  08/31/2013  . Pap Smear  07/23/2015  . Tetanus/tdap  08/10/2020    The following portions of the patient's history were reviewed and updated as appropriate: allergies, current medications, past family history, past medical history, past social history, past surgical history and problem list.  Review of Systems A comprehensive review of systems was negative.   Objective:    BP 121/80  Pulse 93  Ht 5\' 5"  (1.651 m)  Wt 191 lb (86.637 kg)  BMI 31.78 kg/m2 General appearance: alert, cooperative and appears stated age Head: Normocephalic, without obvious abnormality, atraumatic Eyes: conj clear, EOMi, PEERLA Ears: normal TM's and external ear canals both ears Nose: Nares normal. Septum midline. Mucosa normal. No drainage or sinus tenderness. Throat: lips, mucosa, and tongue normal; teeth and gums normal Neck: no adenopathy, no carotid bruit, no JVD, supple, symmetrical, trachea midline and thyroid not  enlarged, symmetric, no tenderness/mass/nodules Back: symmetric, no curvature. ROM normal. No CVA tenderness. Lungs: clear to auscultation bilaterally Breasts: normal appearance, no masses or tenderness Heart: regular rate and rhythm, S1, S2 normal, no murmur, click, rub or gallop Abdomen: soft, non-tender; bowel sounds normal; no masses,  no organomegaly Pelvic: cervix normal in appearance, external genitalia normal, no adnexal masses or tenderness, no cervical motion tenderness, rectovaginal septum normal, uterus normal size, shape, and consistency, vagina normal without discharge and thick white cervical d/c Extremities: extremities normal, atraumatic, no cyanosis or edema Pulses: 2+ and symmetric Skin: Skin color, texture, turgor normal. No rashes or lesions Lymph nodes: Cervical, supraclavicular, and axillary nodes normal. Neurologic: Alert and oriented X 3, normal strength and tone. Normal symmetric reflexes. Normal coordination and gait    Assessment:    Healthy female exam.      Plan:     See After Visit Summary for Counseling Recommendations  Keep up a regular exercise program and make sure you are eating a healthy diet Try to eat 4 servings of dairy a day, or if you are lactose intolerant take a calcium with vitamin D daily.  Your vaccines are up to date.   HTN- well controlled. F/U in 6 monthts.   CKD- recheck kidney function today.  Vitamin D deficiency-recheck vitamin D today. She is taking her supplement regularly.

## 2013-07-24 NOTE — Addendum Note (Signed)
Addended by: Avon Gully C on: 07/24/2013 10:00 AM   Modules accepted: Orders

## 2013-07-24 NOTE — Patient Instructions (Signed)
Keep up a regular exercise program and make sure you are eating a healthy diet Try to eat 4 servings of dairy a day, or if you are lactose intolerant take a calcium with vitamin D daily.  Your vaccines are up to date.   

## 2013-07-25 LAB — VITAMIN D 25 HYDROXY (VIT D DEFICIENCY, FRACTURES): Vit D, 25-Hydroxy: 42 ng/mL (ref 30–89)

## 2013-07-28 ENCOUNTER — Other Ambulatory Visit: Payer: Self-pay | Admitting: Family Medicine

## 2013-07-28 LAB — WET PREP FOR TRICH, YEAST, CLUE

## 2013-09-17 ENCOUNTER — Other Ambulatory Visit: Payer: Self-pay | Admitting: Family Medicine

## 2013-11-09 ENCOUNTER — Other Ambulatory Visit: Payer: Self-pay | Admitting: *Deleted

## 2013-11-09 ENCOUNTER — Other Ambulatory Visit: Payer: Self-pay | Admitting: Family Medicine

## 2013-11-09 MED ORDER — ROSUVASTATIN CALCIUM 10 MG PO TABS: 10.0000 mg | ORAL_TABLET | Freq: Every day | ORAL | Status: DC

## 2013-11-09 MED ORDER — ARIPIPRAZOLE 2 MG PO TABS: ORAL_TABLET | ORAL | Status: DC

## 2013-11-09 MED ORDER — DROSPIRENONE-ETHINYL ESTRADIOL 3-0.02 MG PO TABS: 1.0000 | ORAL_TABLET | Freq: Every day | ORAL | Status: DC

## 2013-11-10 ENCOUNTER — Other Ambulatory Visit: Payer: Self-pay | Admitting: *Deleted

## 2013-11-10 MED ORDER — FOLIVANE-PLUS PO CAPS: 1.0000 | ORAL_CAPSULE | Freq: Every day | ORAL | Status: DC

## 2013-12-25 ENCOUNTER — Other Ambulatory Visit: Payer: Self-pay | Admitting: *Deleted

## 2013-12-25 MED ORDER — SERTRALINE HCL 100 MG PO TABS: ORAL_TABLET | ORAL | Status: DC

## 2014-01-04 ENCOUNTER — Ambulatory Visit (INDEPENDENT_AMBULATORY_CARE_PROVIDER_SITE_OTHER): Payer: Medicare Other | Admitting: Family Medicine

## 2014-01-04 ENCOUNTER — Ambulatory Visit (INDEPENDENT_AMBULATORY_CARE_PROVIDER_SITE_OTHER): Payer: Medicare Other

## 2014-01-04 ENCOUNTER — Encounter: Payer: Self-pay | Admitting: Family Medicine

## 2014-01-04 ENCOUNTER — Other Ambulatory Visit: Payer: Self-pay | Admitting: Family Medicine

## 2014-01-04 VITALS — BP 142/84 | HR 80 | Temp 98.0°F | Wt 192.0 lb

## 2014-01-04 DIAGNOSIS — R1011 Right upper quadrant pain: Secondary | ICD-10-CM

## 2014-01-04 DIAGNOSIS — D72829 Elevated white blood cell count, unspecified: Secondary | ICD-10-CM

## 2014-01-04 DIAGNOSIS — R0789 Other chest pain: Secondary | ICD-10-CM

## 2014-01-04 DIAGNOSIS — R071 Chest pain on breathing: Secondary | ICD-10-CM

## 2014-01-04 LAB — CBC WITH DIFFERENTIAL/PLATELET
BASOS ABS: 0 10*3/uL (ref 0.0–0.1)
Basophils Relative: 0 % (ref 0–1)
EOS PCT: 0 % (ref 0–5)
Eosinophils Absolute: 0 10*3/uL (ref 0.0–0.7)
HEMATOCRIT: 40.6 % (ref 36.0–46.0)
Hemoglobin: 13.4 g/dL (ref 12.0–15.0)
LYMPHS PCT: 11 % — AB (ref 12–46)
Lymphs Abs: 2.1 10*3/uL (ref 0.7–4.0)
MCH: 26.9 pg (ref 26.0–34.0)
MCHC: 32.9 g/dL (ref 30.0–36.0)
MCV: 81.3 fL (ref 78.0–100.0)
MONO ABS: 0.7 10*3/uL (ref 0.1–1.0)
Monocytes Relative: 3 % (ref 3–12)
Neutro Abs: 16.9 10*3/uL — ABNORMAL HIGH (ref 1.7–7.7)
Neutrophils Relative %: 86 % — ABNORMAL HIGH (ref 43–77)
Platelets: 193 10*3/uL (ref 150–400)
RBC: 4.99 MIL/uL (ref 3.87–5.11)
RDW: 14.8 % (ref 11.5–15.5)
WBC: 19.7 10*3/uL — AB (ref 4.0–10.5)

## 2014-01-04 NOTE — Progress Notes (Signed)
Subjective:    Patient ID: Desiree Wilson, female    DOB: 02-25-1966, 48 y.o.   MRN: [id number]  HPI  Right sided chest pain started 4 days ago.  Now radiating towards her shoulder blade and back. Pain is 9/10.  Went to [facility] 2 days ago and they felt it was shingles. Given acyclovir and percocoet and prednisone. She did start the medication.   Dry cough x 3 days as well. tood some Dayquail this AM.  She hasn't developed any rash. She has had more heartburn the last 2 days.  Some nausea. Percocet does help some.  Eating doesn't make her pain worse. Pain is constant.  It has been waking her up every 2 hours.   Review of Systems BP 142/84  Pulse 80  Temp(Src) 98 F (36.7 C)  Wt 192 lb (87.091 kg)  SpO2 97%    Allergies  Allergen Reactions  . Azithromycin     REACTION: C Diff  . Methadone Hcl Other (See Comments)    REACTION: Hallucinations  . Phenobarbital     REACTION: Excited    Past Medical History  Diagnosis Date  . Allergy     rhinitis  . Depression   . Headache(784.0)   . Hyperlipidemia   . Osteoarthritis   . Facial paralysis     right face/ chronic pain syndrome (pain management Dr Sydell Axon)  . Trigeminal neuralgia     right  . Hypotension   . Blood transfusion   . Anemia     Past Surgical History  Procedure Laterality Date  . Osteotomy    . Mandible surgery    . Plastic facial surgery      History   Social History  . Marital Status: Single    Spouse Name: N/A    Number of Children: N/A  . Years of Education: N/A   Occupational History  . Not on file.   Social History Main Topics  . Smoking status: Former Smoker -- 6 years    Types: Cigarettes    Quit date: 12/31/2000  . Smokeless tobacco: Not on file  . Alcohol Use: 0.6 oz/week    1 Glasses of wine per week  . Drug Use: Not on file  . Sexual Activity: Yes    Partners: Male   Other Topics Concern  . Not on file   Social History Narrative   Water aerobics several days per week. Lives  with her boyfriend.     Family History  Problem Relation Age of Onset  . Stroke Father     Outpatient Encounter Prescriptions as of 01/04/2014  Medication Sig  . AMBULATORY NON FORMULARY MEDICATION Compression sleeve for forearm.  . ARIPiprazole (ABILIFY) 2 MG tablet TAKE 1 TABLET BY MOUTH EVERY DAY  . clonazePAM (KLONOPIN) 1 MG tablet Take 1 tablet (1 mg total) by mouth 2 (two) times daily.  Marland Kitchen doxepin (SINEQUAN) 25 MG capsule Take 25 mg by mouth at bedtime. Take 1-3 caps at bedtime as needed.  . drospirenone-ethinyl estradiol (YAZ,GIANVI,LORYNA) 3-0.02 MG tablet Take 1 tablet by mouth daily.  Marland Kitchen FeFum-FePoly-FA-B Cmp-C-Biot (FOLIVANE-PLUS) CAPS Take 1 capsule by mouth daily. Before meals  . frovatriptan (FROVA) 2.5 MG tablet Take 2.5 mg by mouth daily.    . Lacosamide (VIMPAT) 100 MG TABS Take by mouth 3 (three) times daily.    . rosuvastatin (CRESTOR) 10 MG tablet Take 1 tablet (10 mg total) by mouth daily.  . sertraline (ZOLOFT) 100 MG tablet TAKE 1 AND  1/2 TABLETS     DAILY  . tiZANidine (ZANAFLEX) 4 MG tablet Take 4 mg by mouth as needed.   . topiramate (TOPAMAX) 100 MG tablet Take by mouth. Take 3 tabs once daily   . traMADol (ULTRAM) 50 MG tablet Take 50 mg by mouth 4 (four) times daily.    . traZODone (DESYREL) 100 MG tablet Take 100 mg by mouth at bedtime. Take 1-2 at bedtime   . verapamil (CALAN-SR) 180 MG CR tablet Take 1 tablet (180 mg total) by mouth daily.  . [DISCONTINUED] cephALEXin (KEFLEX) 500 MG capsule Take 1 capsule (500 mg total) by mouth 3 (three) times daily.  . [DISCONTINUED] diclofenac (VOLTAREN) 50 MG EC tablet TAKE 1 TABLET BY MOUTH THREE TIMES DAILY  . [DISCONTINUED] fluconazole (DIFLUCAN) 150 MG tablet Take 1 tablet (150 mg total) by mouth once.           Objective:   Physical Exam  Constitutional: She is oriented to person, place, and time. She appears well-developed and well-nourished.  HENT:  Head: Normocephalic and atraumatic.  Cardiovascular:  Normal rate, regular rhythm and normal heart sounds.   Pulmonary/Chest: Effort normal and breath sounds normal.  Normal right breast exam. No masses or lesions. No abnormal nipple discharge. She is nontender over the breast tissue itself. She is tender over the lower right ribs as well is just below the axilla on the right. She's also tender below the scapula. Nontender over the thoracic spine. She is also mildly tender in the right upper quadrant with deep palpation.  Abdominal: Soft. Bowel sounds are normal. She exhibits no distension and no mass. There is tenderness. There is no guarding.  Tender in the RUQ  Neurological: She is alert and oriented to person, place, and time.  Skin: Skin is warm and dry.  Psychiatric: She has a normal mood and affect. Her behavior is normal.          Assessment & Plan:  Right chest pain- Unclear etiology.  Still could be shingles. Though she's had symptoms for 4 days. Typically at this point a rash starts to develop. Seh has had not skin changes and teh skin itself is not tender or sensitive. No pain over teh sternum.  Her pain is significant which is often typical with shingles. Also consider other etiologies. Will get a chest x-ray. Pulse ox is normal at 97% which is reassuring. Also consider cholecystitis. She's been afebrile and has not tachycardic. We'll check a CBC with differential as well as a CMP. Acute hepatitis is possible, but her level of her pain is unusual. Also consider radicular pain

## 2014-01-05 ENCOUNTER — Ambulatory Visit (INDEPENDENT_AMBULATORY_CARE_PROVIDER_SITE_OTHER): Payer: Medicare Other

## 2014-01-05 ENCOUNTER — Other Ambulatory Visit: Payer: Self-pay | Admitting: *Deleted

## 2014-01-05 DIAGNOSIS — D72829 Elevated white blood cell count, unspecified: Secondary | ICD-10-CM

## 2014-01-05 DIAGNOSIS — R1011 Right upper quadrant pain: Secondary | ICD-10-CM

## 2014-01-05 LAB — COMPLETE METABOLIC PANEL WITH GFR
ALBUMIN: 3.7 g/dL (ref 3.5–5.2)
ALK PHOS: 73 U/L (ref 39–117)
ALT: 18 U/L (ref 0–35)
AST: 18 U/L (ref 0–37)
BUN: 22 mg/dL (ref 6–23)
CO2: 19 mEq/L (ref 19–32)
Calcium: 9.5 mg/dL (ref 8.4–10.5)
Chloride: 109 mEq/L (ref 96–112)
Creat: 1.38 mg/dL — ABNORMAL HIGH (ref 0.50–1.10)
GFR, Est African American: 53 mL/min — ABNORMAL LOW
GFR, Est Non African American: 46 mL/min — ABNORMAL LOW
Glucose, Bld: 97 mg/dL (ref 70–99)
POTASSIUM: 4 meq/L (ref 3.5–5.3)
SODIUM: 139 meq/L (ref 135–145)
TOTAL PROTEIN: 7 g/dL (ref 6.0–8.3)
Total Bilirubin: 0.2 mg/dL — ABNORMAL LOW (ref 0.3–1.2)

## 2014-01-06 LAB — CBC WITH DIFFERENTIAL/PLATELET
BASOS PCT: 0 % (ref 0–1)
Basophils Absolute: 0 10*3/uL (ref 0.0–0.1)
EOS ABS: 0.2 10*3/uL (ref 0.0–0.7)
EOS PCT: 1 % (ref 0–5)
HCT: 42.8 % (ref 36.0–46.0)
HEMOGLOBIN: 13.7 g/dL (ref 12.0–15.0)
Lymphocytes Relative: 39 % (ref 12–46)
Lymphs Abs: 6.4 10*3/uL — ABNORMAL HIGH (ref 0.7–4.0)
MCH: 26.2 pg (ref 26.0–34.0)
MCHC: 32 g/dL (ref 30.0–36.0)
MCV: 81.9 fL (ref 78.0–100.0)
MONO ABS: 1 10*3/uL (ref 0.1–1.0)
MONOS PCT: 6 % (ref 3–12)
NEUTROS PCT: 53 % (ref 43–77)
Neutro Abs: 8.7 10*3/uL — ABNORMAL HIGH (ref 1.7–7.7)
Platelets: 169 10*3/uL (ref 150–400)
RBC: 5.22 MIL/uL — ABNORMAL HIGH (ref 3.87–5.11)
RDW: 15.1 % (ref 11.5–15.5)
WBC: 16.4 10*3/uL — ABNORMAL HIGH (ref 4.0–10.5)

## 2014-01-25 DIAGNOSIS — G43719 Chronic migraine without aura, intractable, without status migrainosus: Secondary | ICD-10-CM | POA: Insufficient documentation

## 2014-01-27 ENCOUNTER — Ambulatory Visit: Payer: No Typology Code available for payment source | Admitting: Family Medicine

## 2014-01-28 ENCOUNTER — Ambulatory Visit (INDEPENDENT_AMBULATORY_CARE_PROVIDER_SITE_OTHER): Payer: Medicare Other | Admitting: Family Medicine

## 2014-01-28 ENCOUNTER — Encounter: Payer: Self-pay | Admitting: Family Medicine

## 2014-01-28 VITALS — BP 122/72 | HR 88 | Temp 98.1°F | Ht 65.0 in | Wt 189.0 lb

## 2014-01-28 DIAGNOSIS — F3289 Other specified depressive episodes: Secondary | ICD-10-CM

## 2014-01-28 DIAGNOSIS — I10 Essential (primary) hypertension: Secondary | ICD-10-CM

## 2014-01-28 DIAGNOSIS — I129 Hypertensive chronic kidney disease with stage 1 through stage 4 chronic kidney disease, or unspecified chronic kidney disease: Secondary | ICD-10-CM

## 2014-01-28 DIAGNOSIS — N183 Chronic kidney disease, stage 3 unspecified: Secondary | ICD-10-CM

## 2014-01-28 DIAGNOSIS — F329 Major depressive disorder, single episode, unspecified: Secondary | ICD-10-CM

## 2014-01-28 MED ORDER — MOMETASONE FUROATE 50 MCG/ACT NA SUSP: 2.0000 | Freq: Every day | NASAL | Status: DC

## 2014-01-28 MED ORDER — DROSPIRENONE-ETHINYL ESTRADIOL 3-0.02 MG PO TABS: 1.0000 | ORAL_TABLET | Freq: Every day | ORAL | Status: DC

## 2014-01-28 MED ORDER — ARIPIPRAZOLE 5 MG PO TABS: ORAL_TABLET | ORAL | Status: DC

## 2014-01-28 NOTE — Progress Notes (Signed)
   Subjective:    Patient ID: Desiree Wilson, female    DOB: 12-18-1966, 48 y.o.   MRN: [id number]  HPI Hypertension- Pt denies chest pain, SOB, dizziness, or heart palpitations.  Taking meds as directed w/o problems.  Denies medication side effects.  Still having some right sided right chest wall pain but thinks may be more muscular.  Still some cough with occassional pleghm.    Has been more stressed and has felt more down lately.  She is not happy right now. She has lost 4 lbs.  She wonders if her meds need to be adjusted.    CKD-3- Stable.   Lab Results  Component Value Date   CREATININE 1.38* 01/04/2014     Review of Systems     Objective:   Physical Exam  Constitutional: She is oriented to person, place, and time. She appears well-developed and well-nourished.  HENT:  Head: Normocephalic and atraumatic.  Cardiovascular: Normal rate, regular rhythm and normal heart sounds.   Has a cyst on left under side of breast.  Appears to be healing. Some break in the skin  Pulmonary/Chest: Effort normal and breath sounds normal.  Neurological: She is alert and oriented to person, place, and time.  Skin: Skin is warm and dry.  Psychiatric: She has a normal mood and affect. Her behavior is normal.          Assessment & Plan:  HTN - Well controlled.  Continue current regimen. F/U in 6 months.    Depression-decrease in mood secondary to stressors. We discussed different options. We'll increase Abilify to 4 mg. Followup in 6 weeks to make sure doing well and to make sure that it's improving.  CKD-3 - just checked kidneys earlier this month when she was not felling and it was stable.    Right chest wall pain-improved but not resolved. She definitely feels that it's musculoskeletal. She's not great job with weight loss so far.

## 2014-02-01 ENCOUNTER — Other Ambulatory Visit: Payer: Self-pay | Admitting: Family Medicine

## 2014-02-01 ENCOUNTER — Telehealth: Payer: Self-pay | Admitting: *Deleted

## 2014-02-01 MED ORDER — ARIPIPRAZOLE 2 MG PO TABS: 4.0000 mg | ORAL_TABLET | Freq: Every day | ORAL | Status: DC

## 2014-02-01 MED ORDER — ARIPIPRAZOLE 2 MG PO TABS: 4.0000 mg | ORAL_TABLET | Freq: Once | ORAL | Status: DC

## 2014-02-01 NOTE — Telephone Encounter (Signed)
LMOM with response and about the deductible information.  Oscar La, LPN

## 2014-02-01 NOTE — Telephone Encounter (Signed)
Pt states she went to get the Abilify 5 mg you prescribed and states it is $300. She is asking if you will send #60 of the 2mg  and let her take 2 tabs daily. States she is in the gap at this time.  Oscar La, LPN

## 2014-02-01 NOTE — Telephone Encounter (Signed)
I will send over new Rx but she may have a deductable.

## 2014-02-08 ENCOUNTER — Encounter: Payer: Self-pay | Admitting: Physician Assistant

## 2014-02-08 ENCOUNTER — Ambulatory Visit (INDEPENDENT_AMBULATORY_CARE_PROVIDER_SITE_OTHER): Payer: Medicare Other | Admitting: Physician Assistant

## 2014-02-08 VITALS — BP 103/66 | HR 76 | Temp 97.5°F | Wt 187.0 lb

## 2014-02-08 DIAGNOSIS — L0390 Cellulitis, unspecified: Principal | ICD-10-CM

## 2014-02-08 DIAGNOSIS — L0291 Cutaneous abscess, unspecified: Secondary | ICD-10-CM

## 2014-02-08 DIAGNOSIS — L02219 Cutaneous abscess of trunk, unspecified: Secondary | ICD-10-CM

## 2014-02-08 DIAGNOSIS — L03319 Cellulitis of trunk, unspecified: Secondary | ICD-10-CM

## 2014-02-08 MED ORDER — DOXYCYCLINE HYCLATE 100 MG PO TABS: 100.0000 mg | ORAL_TABLET | Freq: Two times a day (BID) | ORAL | Status: DC

## 2014-02-08 NOTE — Patient Instructions (Signed)
Keep covered until drained.   Abscess An abscess is an infected area that contains a collection of pus and debris.It can occur in almost any part of the body. An abscess is also known as a furuncle or boil. CAUSES  An abscess occurs when tissue gets infected. This can occur from blockage of oil or sweat glands, infection of hair follicles, or a minor injury to the skin. As the body tries to fight the infection, pus collects in the area and creates pressure under the skin. This pressure causes pain. People with weakened immune systems have difficulty fighting infections and get certain abscesses more often.  SYMPTOMS Usually an abscess develops on the skin and becomes a painful mass that is red, warm, and tender. If the abscess forms under the skin, you may feel a moveable soft area under the skin. Some abscesses break open (rupture) on their own, but most will continue to get worse without care. The infection can spread deeper into the body and eventually into the bloodstream, causing you to feel ill.  DIAGNOSIS  Your caregiver will take your medical history and perform a physical exam. A sample of fluid may also be taken from the abscess to determine what is causing your infection. TREATMENT  Your caregiver may prescribe antibiotic medicines to fight the infection. However, taking antibiotics alone usually does not cure an abscess. Your caregiver may need to make a small cut (incision) in the abscess to drain the pus. In some cases, gauze is packed into the abscess to reduce pain and to continue draining the area. HOME CARE INSTRUCTIONS   Only take over-the-counter or prescription medicines for pain, discomfort, or fever as directed by your caregiver.  If you were prescribed antibiotics, take them as directed. Finish them even if you start to feel better.  If gauze is used, follow your caregiver's directions for changing the gauze.  To avoid spreading the infection:  Keep your draining  abscess covered with a bandage.  Wash your hands well.  Do not share personal care items, towels, or whirlpools with others.  Avoid skin contact with others.  Keep your skin and clothes clean around the abscess.  Keep all follow-up appointments as directed by your caregiver. SEEK MEDICAL CARE IF:   You have increased pain, swelling, redness, fluid drainage, or bleeding.  You have muscle aches, chills, or a general ill feeling.  You have a fever. MAKE SURE YOU:   Understand these instructions.  Will watch your condition.  Will get help right away if you are not doing well or get worse. Document Released: 09/26/2005 Document Revised: 06/17/2012 Document Reviewed: 02/29/2012 [facility] Patient Information 2014 [place].

## 2014-02-09 ENCOUNTER — Telehealth: Payer: Self-pay | Admitting: *Deleted

## 2014-02-09 ENCOUNTER — Other Ambulatory Visit: Payer: Self-pay | Admitting: Physician Assistant

## 2014-02-09 MED ORDER — HYDROCODONE-ACETAMINOPHEN 5-325 MG PO TABS: 1.0000 | ORAL_TABLET | Freq: Three times a day (TID) | ORAL | Status: DC | PRN

## 2014-02-09 NOTE — Telephone Encounter (Signed)
Is she taking the tramadol 50mg ?

## 2014-02-09 NOTE — Progress Notes (Signed)
   Subjective:    Patient ID: TRACIA LACOMB, female    DOB: 06-09-1966, 49 y.o.   MRN: [id number]  HPI Patient is a 48 year old female who presents to the clinic with a warm, painful, nodule with a lot of redness and swelling under her left breast. Small bump was present when she saw Dr. Charise Carwin approximately 11 days ago. Dr. Charise Carwin suggested she try warm compresses. Patient has been tried warm compresses and it did seem to be getting larger and larger. The pain has really started to bother her. She denies any fever, chills, nausea or vomiting. Left breast is very tender to touch.   Review of Systems     Objective:   Physical Exam  Skin:  Left breast from 5oclock to 6oclock 2cm by 2cm indurated with surrounding erythema and warm to touch.           Assessment & Plan:  Breast, left abscess and cellulitis-Will I and D today. Due to surrounding cellulitis we'll treat with doxycycline for 10 days. Pt was instructed to take a little bit of packing out every day. If falls out no problem. Shower with warm water and soap after 24 hours. Watch for signs of infections. Follow up as needed.   Incision and Drainage Procedure Note  Pre-operative Diagnosis : boil on the left breast: 2cm by 2cm with surround erythema  Post-operative Diagnosis: same  Indications: infection/pain  Anesthesia: 1% lidocaine with epinephrine  Procedure Details  The procedure, risks and complications have been discussed in detail (including, but not limited to airway compromise, infection, bleeding) with the patient, and the patient has signed consent to the procedure.  The skin was sterilely prepped and draped over the affected area in the usual fashion. After adequate local anesthesia, I&D with a #11 blade was performed on the left breast at 5 oclock to 6 oclock position. Purulent drainage: present The patient was observed until stable.  Findings: Infected abscess with cellulitis  EBL: scant  Drains:  packing was placed  Condition: Tolerated procedure well   Complications: none.

## 2014-02-09 NOTE — Telephone Encounter (Signed)
LMOM asking about Tramadol.  Oscar La, LPN

## 2014-02-09 NOTE — Telephone Encounter (Signed)
Ok printed norco to take as needed for pain. Do not take with tramadol.

## 2014-02-09 NOTE — Telephone Encounter (Signed)
Pt called asking for something for the pain where you done the I&D yesterday. States she changed her pain patch but it has not helped and she hasn't slept due to the pain.

## 2014-02-09 NOTE — Telephone Encounter (Signed)
Pt called back & left vm stating that she is taking the tramadol & also clonazepam for her anxiety but is still having some difficulty sleeping.

## 2014-02-09 NOTE — Telephone Encounter (Signed)
Pt notified of rx & to not take it with tramadol.

## 2014-02-11 LAB — WOUND CULTURE
Gram Stain: NONE SEEN
Gram Stain: NONE SEEN
Gram Stain: NONE SEEN

## 2014-03-09 ENCOUNTER — Other Ambulatory Visit: Payer: Self-pay | Admitting: Family Medicine

## 2014-03-11 ENCOUNTER — Ambulatory Visit (INDEPENDENT_AMBULATORY_CARE_PROVIDER_SITE_OTHER): Payer: Medicare Other | Admitting: Family Medicine

## 2014-03-11 ENCOUNTER — Encounter: Payer: Self-pay | Admitting: Family Medicine

## 2014-03-11 VITALS — BP 121/76 | HR 87 | Temp 98.4°F | Resp 16 | Wt 185.5 lb

## 2014-03-11 DIAGNOSIS — F3289 Other specified depressive episodes: Secondary | ICD-10-CM

## 2014-03-11 DIAGNOSIS — L0390 Cellulitis, unspecified: Secondary | ICD-10-CM

## 2014-03-11 DIAGNOSIS — L0291 Cutaneous abscess, unspecified: Secondary | ICD-10-CM

## 2014-03-11 DIAGNOSIS — F329 Major depressive disorder, single episode, unspecified: Secondary | ICD-10-CM

## 2014-03-11 MED ORDER — ARIPIPRAZOLE 5 MG PO TABS: 5.0000 mg | ORAL_TABLET | Freq: Every day | ORAL | Status: DC

## 2014-03-11 NOTE — Progress Notes (Signed)
   Subjective:    Patient ID: Desiree Wilson, female    DOB: 02-Sep-1966, 48 y.o.   MRN: [id number]  HPI Depression - doing well on Abilify 5mg . she would like to continue her current regimen. She has new tabs. Says life is much calmer. Feels dealing with things better. Her stress levels are down as well. Says her parents area selling their house and feels her mother can be very mean to her at times.    Had a ketamine infusion yesterday so doesn't feel well today for the trigeminal neuralgia.  Chest wall pain is better.  The cough is better. Cellulitis is better.    Vomited when brushes teeth for the last 8 months.  Has tried changing toothpaste and tried decreasing toothpaste.  Says no GERD.  Says when gets to the back of her teeth she gags and throwup. She's tried using a smaller toothbrush. She's tried crushing with more and less water. Nothing seems to really help. She's discussed this with her dentist. Her dentist had recommended that she discuss with her primary care provider.  Review of Systems     Objective:   Physical Exam  Constitutional: She is oriented to person, place, and time. She appears well-developed and well-nourished.  HENT:  Head: Normocephalic and atraumatic.  Cardiovascular: Normal rate, regular rhythm and normal heart sounds.   Pulmonary/Chest: Effort normal and breath sounds normal.  Neurological: She is alert and oriented to person, place, and time.  Skin: Skin is warm and dry.  Incision under the left breast is healing well.  Psychiatric: She has a normal mood and affect. Her behavior is normal.          Assessment & Plan:  Depression - PHA- 9 score of 2.  Much improved on 4mg . Will go ahead and swith to Abilify 5mg  tab since doesn't come in 4mg .  F/U in 3 months.    Trigeminal neuralgia-treated with ketamine infusion.  Vomiting with brushing teeth. Unclear what may be triggering this. She is Re: tried several things that I would suggest. I really don't  have any other suggestions. I can certainly discuss this with my partners to see if they have any recommendations. Patient is okay with that.  Abscess-well healed. No remaining induration.

## 2014-05-25 ENCOUNTER — Other Ambulatory Visit: Payer: Self-pay | Admitting: Family Medicine

## 2014-06-11 ENCOUNTER — Ambulatory Visit: Payer: Medicare Other | Admitting: Family Medicine

## 2014-06-12 ENCOUNTER — Other Ambulatory Visit: Payer: Self-pay | Admitting: Family Medicine

## 2014-07-05 ENCOUNTER — Other Ambulatory Visit: Payer: Self-pay | Admitting: Family Medicine

## 2014-08-06 ENCOUNTER — Other Ambulatory Visit: Payer: Self-pay | Admitting: Family Medicine

## 2014-08-17 ENCOUNTER — Encounter: Payer: Self-pay | Admitting: Family Medicine

## 2014-08-17 ENCOUNTER — Ambulatory Visit (INDEPENDENT_AMBULATORY_CARE_PROVIDER_SITE_OTHER): Payer: Medicare Other | Admitting: Family Medicine

## 2014-08-17 VITALS — BP 120/81 | HR 94 | Wt 177.0 lb

## 2014-08-17 DIAGNOSIS — I10 Essential (primary) hypertension: Secondary | ICD-10-CM

## 2014-08-17 DIAGNOSIS — F329 Major depressive disorder, single episode, unspecified: Secondary | ICD-10-CM

## 2014-08-17 DIAGNOSIS — R5381 Other malaise: Secondary | ICD-10-CM

## 2014-08-17 DIAGNOSIS — E785 Hyperlipidemia, unspecified: Secondary | ICD-10-CM

## 2014-08-17 DIAGNOSIS — Z23 Encounter for immunization: Secondary | ICD-10-CM

## 2014-08-17 DIAGNOSIS — J3089 Other allergic rhinitis: Secondary | ICD-10-CM

## 2014-08-17 DIAGNOSIS — R5383 Other fatigue: Secondary | ICD-10-CM

## 2014-08-17 DIAGNOSIS — F3289 Other specified depressive episodes: Secondary | ICD-10-CM

## 2014-08-17 MED ORDER — ROSUVASTATIN CALCIUM 10 MG PO TABS: ORAL_TABLET | ORAL | Status: DC

## 2014-08-17 MED ORDER — DROSPIRENONE-ETHINYL ESTRADIOL 3-0.02 MG PO TABS: ORAL_TABLET | ORAL | Status: DC

## 2014-08-17 NOTE — Assessment & Plan Note (Addendum)
PHQ -9 score of 5.  4 points are for sleep and fatigue. Stable.  Continue zoloft and trazodone.  Followup in 4 months. I do think her should recent shoulder injury has affected her mood somewhat. Overall that she's very happy with her current regimen.

## 2014-08-17 NOTE — Assessment & Plan Note (Signed)
Well controlled on current regimen.  F/u in 6 month. Due for CMP and llipids.

## 2014-08-17 NOTE — Assessment & Plan Note (Addendum)
Due for repeat lipids and liver enzymes. She will go to the lab in the morning.

## 2014-08-17 NOTE — Progress Notes (Signed)
   Subjective:    Patient ID: Desiree Wilson, female    DOB: 09-01-1966, 48 y.o.   MRN: [id number]  HPI Here today to followup on depression-previous PHQ 9 score was 2. She still complaining of little interest in doing things several days a week. She's also having some sleep issues as well as fatigued. She has been dealing with a painful right shoulder. She did see an orthopedist and had an injection about 2 weeks ago but did not help. She's going to call and schedule a followup. But this has been wearing on her some.   Hypertension- Pt denies chest pain, SOB, dizziness, or heart palpitations.  Taking meds as directed w/o problems.  Denies medication side effects.    Hyperlipidemia-on Crestor. Tolerating well without any side effects or problems.  Has had a lot of sinus pain , pressure and some productive cough. Says Alesia Banda gets it this stime of year.  Hs has been using decongestant and nasal steroid spray.  She is not on an anthistamine but doesn' t feel they are effective.   Has been really fatigued since stopping her iron about 3 months ago. Has been more achey as well. Says even walking her dog is an effort.  Review of Systems     Objective:   Physical Exam  Constitutional: She is oriented to person, place, and time. She appears well-developed and well-nourished.  HENT:  Head: Normocephalic and atraumatic.  Eyes: Conjunctivae and EOM are normal.  Cardiovascular: Normal rate, regular rhythm and normal heart sounds.   Pulmonary/Chest: Effort normal and breath sounds normal.  Neurological: She is alert and oriented to person, place, and time.  Skin: Skin is warm and dry. No pallor.  Psychiatric: She has a normal mood and affect. Her behavior is normal.          Assessment & Plan:  Given flu shot today.  Fatigue-unclear etiology. We can certainly recheck her iron levels. She's noticed that her fatigue is worsened since she's been off her iron supplement for the last 3 months. We  will also check her thyroid, B12, folate, vitamin D etc.  Allergic rhinitis-I. still think she might benefit from an antihistamine. She has not tried Zyrtec in the past and this could be helpful. She can call back if she feels like she is getting a sinus infection with increased pain fevers chills sweats or change and nasal discharge.

## 2014-08-25 ENCOUNTER — Other Ambulatory Visit: Payer: Self-pay | Admitting: Family Medicine

## 2014-08-25 DIAGNOSIS — Z1231 Encounter for screening mammogram for malignant neoplasm of breast: Secondary | ICD-10-CM

## 2014-08-26 ENCOUNTER — Other Ambulatory Visit: Payer: Self-pay | Admitting: Family Medicine

## 2014-08-26 ENCOUNTER — Encounter: Payer: Self-pay | Admitting: Family Medicine

## 2014-08-26 DIAGNOSIS — N183 Chronic kidney disease, stage 3 unspecified: Secondary | ICD-10-CM | POA: Insufficient documentation

## 2014-08-26 DIAGNOSIS — D72829 Elevated white blood cell count, unspecified: Secondary | ICD-10-CM

## 2014-08-26 DIAGNOSIS — N182 Chronic kidney disease, stage 2 (mild): Secondary | ICD-10-CM | POA: Insufficient documentation

## 2014-08-26 LAB — CBC WITH DIFFERENTIAL/PLATELET
BASOS ABS: 0 10*3/uL (ref 0.0–0.1)
Basophils Relative: 0 % (ref 0–1)
Eosinophils Absolute: 0.1 10*3/uL (ref 0.0–0.7)
Eosinophils Relative: 1 % (ref 0–5)
HEMATOCRIT: 41.1 % (ref 36.0–46.0)
Hemoglobin: 13.4 g/dL (ref 12.0–15.0)
LYMPHS PCT: 22 % (ref 12–46)
Lymphs Abs: 3.1 10*3/uL (ref 0.7–4.0)
MCH: 26.5 pg (ref 26.0–34.0)
MCHC: 32.6 g/dL (ref 30.0–36.0)
MCV: 81.4 fL (ref 78.0–100.0)
MONO ABS: 0.6 10*3/uL (ref 0.1–1.0)
MONOS PCT: 4 % (ref 3–12)
Neutro Abs: 10.2 10*3/uL — ABNORMAL HIGH (ref 1.7–7.7)
Neutrophils Relative %: 73 % (ref 43–77)
Platelets: 179 10*3/uL (ref 150–400)
RBC: 5.05 MIL/uL (ref 3.87–5.11)
RDW: 14.9 % (ref 11.5–15.5)
WBC: 14 10*3/uL — AB (ref 4.0–10.5)

## 2014-08-26 LAB — FERRITIN: Ferritin: 146 ng/mL (ref 10–291)

## 2014-08-26 LAB — IRON AND TIBC
%SAT: 23 % (ref 20–55)
Iron: 73 ug/dL (ref 42–145)
TIBC: 320 ug/dL (ref 250–470)
UIBC: 247 ug/dL (ref 125–400)

## 2014-08-26 LAB — COMPLETE METABOLIC PANEL WITH GFR
ALK PHOS: 73 U/L (ref 39–117)
ALT: 11 U/L (ref 0–35)
AST: 15 U/L (ref 0–37)
Albumin: 3.7 g/dL (ref 3.5–5.2)
BUN: 19 mg/dL (ref 6–23)
CALCIUM: 8.9 mg/dL (ref 8.4–10.5)
CHLORIDE: 109 meq/L (ref 96–112)
CO2: 17 mEq/L — ABNORMAL LOW (ref 19–32)
CREATININE: 1.3 mg/dL — AB (ref 0.50–1.10)
GFR, Est African American: 56 mL/min — ABNORMAL LOW
GFR, Est Non African American: 49 mL/min — ABNORMAL LOW
Glucose, Bld: 88 mg/dL (ref 70–99)
Potassium: 3.9 mEq/L (ref 3.5–5.3)
Sodium: 136 mEq/L (ref 135–145)
Total Bilirubin: 0.3 mg/dL (ref 0.2–1.2)
Total Protein: 6.6 g/dL (ref 6.0–8.3)

## 2014-08-26 LAB — LIPID PANEL
Cholesterol: 187 mg/dL (ref 0–200)
HDL: 58 mg/dL (ref >39)
LDL Cholesterol: 65 mg/dL (ref 0–99)
Total CHOL/HDL Ratio: 3.2 Ratio
Triglycerides: 318 mg/dL — ABNORMAL HIGH (ref <150)
VLDL: 64 mg/dL — ABNORMAL HIGH (ref 0–40)

## 2014-08-26 LAB — FOLATE: FOLATE: 8.5 ng/mL

## 2014-08-26 LAB — VITAMIN D 25 HYDROXY (VIT D DEFICIENCY, FRACTURES): VIT D 25 HYDROXY: 27 ng/mL — AB (ref 30–89)

## 2014-08-26 LAB — VITAMIN B12: VITAMIN B 12: 314 pg/mL (ref 211–911)

## 2014-08-26 LAB — TSH: TSH: 1.035 u[IU]/mL (ref 0.350–4.500)

## 2014-08-26 LAB — MAGNESIUM: MAGNESIUM: 1.8 mg/dL (ref 1.5–2.5)

## 2014-08-31 ENCOUNTER — Other Ambulatory Visit: Payer: Self-pay | Admitting: Family Medicine

## 2014-09-01 ENCOUNTER — Other Ambulatory Visit: Payer: Self-pay | Admitting: Family Medicine

## 2014-09-01 DIAGNOSIS — D72829 Elevated white blood cell count, unspecified: Secondary | ICD-10-CM

## 2014-09-01 LAB — PATHOLOGIST SMEAR REVIEW

## 2014-09-01 LAB — SEDIMENTATION RATE: Sed Rate: 20 mm/hr (ref 0–22)

## 2014-09-02 ENCOUNTER — Other Ambulatory Visit: Payer: Self-pay | Admitting: Family Medicine

## 2014-09-02 LAB — C-REACTIVE PROTEIN: CRP: 3.3 mg/dL — AB (ref <0.60)

## 2014-09-07 ENCOUNTER — Ambulatory Visit: Payer: Medicare Other

## 2014-09-14 ENCOUNTER — Ambulatory Visit: Payer: Medicare Other

## 2014-09-26 ENCOUNTER — Other Ambulatory Visit: Payer: Self-pay | Admitting: Family Medicine

## 2014-09-29 ENCOUNTER — Ambulatory Visit: Payer: Medicare Other

## 2014-09-29 ENCOUNTER — Other Ambulatory Visit: Payer: Self-pay | Admitting: Family Medicine

## 2014-10-06 ENCOUNTER — Ambulatory Visit: Payer: Medicare Other

## 2014-10-07 ENCOUNTER — Ambulatory Visit (INDEPENDENT_AMBULATORY_CARE_PROVIDER_SITE_OTHER): Payer: Medicare Other

## 2014-10-07 DIAGNOSIS — Z1231 Encounter for screening mammogram for malignant neoplasm of breast: Secondary | ICD-10-CM

## 2014-11-06 ENCOUNTER — Other Ambulatory Visit: Payer: Self-pay | Admitting: Family Medicine

## 2014-11-10 ENCOUNTER — Other Ambulatory Visit: Payer: Self-pay | Admitting: *Deleted

## 2014-11-10 ENCOUNTER — Telehealth: Payer: Self-pay | Admitting: Hematology & Oncology

## 2014-11-10 NOTE — Telephone Encounter (Signed)
Left vm w NEW PATIENT today to remind them of their appointment with Dr. Ennever. Also, advised them to bring all medication bottles and insurance card information. ° °

## 2014-11-11 ENCOUNTER — Ambulatory Visit: Payer: Medicare Other

## 2014-11-11 ENCOUNTER — Ambulatory Visit (HOSPITAL_BASED_OUTPATIENT_CLINIC_OR_DEPARTMENT_OTHER): Payer: Medicare Other | Admitting: Hematology & Oncology

## 2014-11-11 ENCOUNTER — Other Ambulatory Visit (HOSPITAL_BASED_OUTPATIENT_CLINIC_OR_DEPARTMENT_OTHER): Payer: Medicare Other | Admitting: Lab

## 2014-11-11 VITALS — BP 118/73 | HR 83 | Resp 16 | Ht 65.0 in | Wt 180.0 lb

## 2014-11-11 DIAGNOSIS — D72829 Elevated white blood cell count, unspecified: Secondary | ICD-10-CM

## 2014-11-11 DIAGNOSIS — D509 Iron deficiency anemia, unspecified: Secondary | ICD-10-CM

## 2014-11-11 DIAGNOSIS — K909 Intestinal malabsorption, unspecified: Secondary | ICD-10-CM

## 2014-11-11 DIAGNOSIS — R5382 Chronic fatigue, unspecified: Secondary | ICD-10-CM

## 2014-11-11 LAB — CBC WITH DIFFERENTIAL (CANCER CENTER ONLY)
BASO#: 0 10*3/uL (ref 0.0–0.2)
BASO%: 0.2 % (ref 0.0–2.0)
EOS%: 0.4 % (ref 0.0–7.0)
Eosinophils Absolute: 0.1 10*3/uL (ref 0.0–0.5)
HCT: 42.1 % (ref 34.8–46.6)
HEMOGLOBIN: 13.6 g/dL (ref 11.6–15.9)
LYMPH#: 2.8 10*3/uL (ref 0.9–3.3)
LYMPH%: 22.2 % (ref 14.0–48.0)
MCH: 26.9 pg (ref 26.0–34.0)
MCHC: 32.3 g/dL (ref 32.0–36.0)
MCV: 83 fL (ref 81–101)
MONO#: 0.6 10*3/uL (ref 0.1–0.9)
MONO%: 5 % (ref 0.0–13.0)
NEUT%: 72.2 % (ref 39.6–80.0)
NEUTROS ABS: 9.3 10*3/uL — AB (ref 1.5–6.5)
PLATELETS: 164 10*3/uL (ref 145–400)
RBC: 5.05 10*6/uL (ref 3.70–5.32)
RDW: 14.8 % (ref 11.1–15.7)
WBC: 12.8 10*3/uL — ABNORMAL HIGH (ref 3.9–10.0)

## 2014-11-11 LAB — CHCC SATELLITE - SMEAR

## 2014-11-12 LAB — IRON AND TIBC CHCC
%SAT: 23 % (ref 21–57)
IRON: 69 ug/dL (ref 41–142)
TIBC: 300 ug/dL (ref 236–444)
UIBC: 231 ug/dL (ref 120–384)

## 2014-11-12 LAB — TSH CHCC: TSH: 1.482 m(IU)/L (ref 0.308–3.960)

## 2014-11-12 LAB — FERRITIN CHCC: FERRITIN: 123 ng/mL (ref 9–269)

## 2014-11-12 NOTE — Progress Notes (Signed)
Referral MD  Reason for Referral: leukocytosis  Chief Complaint  Patient presents with  . New Evaluation  : I feel tired all the time.  HPI: Mrs. Desiree Wilson Is a very charming 48 year old white female. She has a very interesting past history. She has trigeminal neuralgia. She has history of right facial nerve paralysis. This happened when she was 48 years old and had jaw surgery that apparently severed the facial nerve.  The trigeminal neuralgia started a few years ago. She sees a pain doctor for this.  She was referred because of some leukocytosis. Going back to April 2012, her white cell count was 9.5. In July 2014, her white count is 14. Her hemoglobin was 13.7 and a platelet count was 179.  In January 2015, her white cell count 16.4. Hemoglobin 13.7 and a platelet count was 169. She had a slightly decreased MCV. She had a normal white cell differential.  She feels tired all the time. She does not have any weight loss or weight gain. There really has not been changing her medications. She does take quite a few medications. She's had no rashes. She's had no change in bowel or bladder habits. She does not have any monthly cycles.  There's been no fever. She's had no eating difficulties. She is not a vegetarian. There is no tobacco or alcohol use.  In her family, she has no significant history.  She's had no change in bowel or bladder habits. There she does have her mammograms routinely.  She had some iron studies done back in August. Her ferritin was 146. Iron saturation was 23%. Patient does have a low vitamin D level. This was checked back in August and was 27.  Her thyroid levels have been okay. Her last TSH, which was done this year was 1.035.  Again, she was kindly referred for the leukocytosis.             Past Medical History  Diagnosis Date  . Allergy     rhinitis  . Depression   . Headache(784.0)   . Hyperlipidemia   . Osteoarthritis   . Facial paralysis      right face/ chronic pain syndrome (pain management Dr Sydell Axon)  . Trigeminal neuralgia     right  . Hypotension   . Blood transfusion   . Anemia   :  Past Surgical History  Procedure Laterality Date  . Osteotomy    . Mandible surgery    . Plastic facial surgery    :  Current outpatient prescriptions: AMBULATORY NON FORMULARY MEDICATION, Compression sleeve for forearm., Disp: 1 Device, Rfl: 0;  ARIPiprazole (ABILIFY) 5 MG tablet, TAKE 1 TABLET BY MOUTH DAILY, Disp: 90 tablet, Rfl: 0;  buprenorphine (BUTRANS) 10 MCG/HR PTWK patch, Place onto the skin., Disp: , Rfl: ;  clonazePAM (KLONOPIN) 1 MG tablet, Take 1 tablet (1 mg total) by mouth 2 (two) times daily., Disp: 30 tablet, Rfl: 2 frovatriptan (FROVA) 2.5 MG tablet, Take 2.5 mg by mouth daily.  , Disp: , Rfl: ;  ketamine (KETALAR) 10 MG/ML injection, Inject into the skin., Disp: , Rfl: ;  mometasone (NASONEX) 50 MCG/ACT nasal spray, Place 2 sprays into the nose daily., Disp: 17 g, Rfl: 12;  rosuvastatin (CRESTOR) 10 MG tablet, TAKE 1 TABLET BY MOUTH EVERY DAY, Disp: 90 tablet, Rfl: 3 sertraline (ZOLOFT) 100 MG tablet, TAKE 1 AND 1/2 TABLETS BY MOUTH EVERY DAY, Disp: 135 tablet, Rfl: 0;  topiramate (TOPAMAX) 100 MG tablet, Take by mouth. Take 3  tabs once daily , Disp: , Rfl: ;  traMADol (ULTRAM) 50 MG tablet, Take 50 mg by mouth 4 (four) times daily.  , Disp: , Rfl: ;  traZODone (DESYREL) 100 MG tablet, Take 100 mg by mouth at bedtime. Take 1-2 at bedtime , Disp: , Rfl:  verapamil (CALAN-SR) 180 MG CR tablet, Take 1 tablet (180 mg total) by mouth daily., Disp: 90 tablet, Rfl: 0;  VESTURA 3-0.02 MG tablet, TAKE 1 TABLET BY MOUTH DAILY, Disp: 84 tablet, Rfl: 0:  :  Allergies  Allergen Reactions  . Azithromycin     REACTION: C Diff  . Methadone Hcl Other (See Comments)    REACTION: Hallucinations  . Phenobarbital     REACTION: Excited  :  Family History  Problem Relation Age of Onset  . Stroke Father   :  History   Social History   . Marital Status: Single    Spouse Name: N/A    Number of Children: N/A  . Years of Education: N/A   Occupational History  . Not on file.   Social History Main Topics  . Smoking status: Former Smoker -- 6 years    Types: Cigarettes    Quit date: 12/31/2000  . Smokeless tobacco: Not on file  . Alcohol Use: 0.6 oz/week    1 Glasses of wine per week  . Drug Use: Not on file  . Sexual Activity:    Partners: Male   Other Topics Concern  . Not on file   Social History Narrative   Water aerobics several days per week. Lives with her boyfriend.   :  Pertinent items are noted in HPI.  Exam: '@IPVITALS' @ Well-developed and well-nourished white female in no obvious distress. Her vital signs show a temperature of 98. Pulse 83. Blood pressure 118/73. Weight is 180 pounds. Head and neck exam shows a normocephalic and atraumatic skull. She has paralysis of the right facial nerve. She has no oral lesions. She has no adenopathy in the neck. Her thyroid is nonpalpable. Lungs are clear. Cardiac exam regular rate and rhythm with no murmurs, rubs or bruits. Abdomen is soft. She has good bowel sounds. There is no fluid wave. There is no palpable liver or spleen tip. Back exam shows no tenderness over the spine, ribs or hips. Extremity shows no clubbing, cyanosis or edema. Skin exam is slightly dry. No rashes are noted. Neurological exam shows the right cranial nerve VII paralysis.   Recent Labs  11/11/14 1214  WBC 12.8*  HGB 13.6  HCT 42.1  PLT 164   No results for input(s): NA, K, CL, CO2, GLUCOSE, BUN, CREATININE, CALCIUM in the last 72 hours.  Blood smear review: mild anisocytosis and poikilocytosis. There is some microcytic red cells. She has some hypochromic red cells. There is no rouleau formation. I see no target cells. She has no schistocytes. There is no spherocytes. I see no nucleated red cells. White cells are mainly increased in number. Shows good maturation of her white blood cells.  I see no hypersegmented polys. There is no atypical lymphocytes. There is no immature myeloid cells. Platelets are adequate in number and size. Platelets are well granulated. Pathology:none    Assessment and Plan: Mrs. Nix is a very charming 48 year old white female with mild leukocytosis. Her blood smear is pretty much unremarkable. I believe that the leukocytosis is probably reactive. It certainly could be related to all the medicines that she takes. I don't see any evidence of a actual bone marrow  disorder. I don't see any evidence of a myeloproliferative disorder.  As far as her feeling tired, she is not really anemic. However, I think that she might be iron deficient. I'm checking her iron studies. Her last iron saturation was borderline. Again, I think that with all of her medicines, if she takes oral iron, which she has done, I just don't think that she will absorb this all that well.  I do not see any evidence of myelodysplasia. I don't see any evidence to support a collagen vascular issue. I don't see anything that would look like thyroid dysfunction or adrenal dysfunction.  She does not need a bone marrow biopsy.  We will see what her iron studies show. If she is borderline iron deficient, then we will give her a dose of Feraheme. I think this would help her out.  Once we get the iron studies back, then we will give her a call and figure out when to get her back.  I spent about 45 minutes with her today. I answered all of her questions. I reviewed her lab work with her.

## 2014-11-15 ENCOUNTER — Encounter: Payer: Self-pay | Admitting: Hematology & Oncology

## 2014-11-15 DIAGNOSIS — K909 Intestinal malabsorption, unspecified: Secondary | ICD-10-CM

## 2014-11-15 DIAGNOSIS — D509 Iron deficiency anemia, unspecified: Secondary | ICD-10-CM | POA: Insufficient documentation

## 2014-11-15 HISTORY — DX: Intestinal malabsorption, unspecified: K90.9

## 2014-11-15 HISTORY — DX: Iron deficiency anemia, unspecified: D50.9

## 2014-11-15 NOTE — Addendum Note (Signed)
Addended by: Burney Gauze R on: 11/15/2014 05:22 PM   Modules accepted: Orders

## 2014-11-16 ENCOUNTER — Telehealth: Payer: Self-pay | Admitting: Hematology & Oncology

## 2014-11-16 NOTE — Telephone Encounter (Signed)
Left pt message on both numbers to call for appointment on Friday.

## 2014-11-17 ENCOUNTER — Telehealth: Payer: Self-pay | Admitting: Hematology & Oncology

## 2014-11-17 NOTE — Telephone Encounter (Signed)
Pt aware of 11-20

## 2014-11-19 ENCOUNTER — Ambulatory Visit (HOSPITAL_BASED_OUTPATIENT_CLINIC_OR_DEPARTMENT_OTHER): Payer: Medicare Other

## 2014-11-19 ENCOUNTER — Other Ambulatory Visit: Payer: Self-pay | Admitting: *Deleted

## 2014-11-19 VITALS — BP 118/69 | HR 67 | Temp 97.9°F | Resp 16

## 2014-11-19 DIAGNOSIS — D509 Iron deficiency anemia, unspecified: Secondary | ICD-10-CM

## 2014-11-19 MED ORDER — FERUMOXYTOL INJECTION 510 MG/17 ML
1020.0000 mg | Freq: Once | INTRAVENOUS | Status: AC
  Administered 2014-11-19: 1020 mg via INTRAVENOUS
  Filled 2014-11-19: qty 34

## 2014-11-19 MED ORDER — SODIUM CHLORIDE 0.9 % IV SOLN
INTRAVENOUS | Status: DC
  Administered 2014-11-19: 10:00:00 via INTRAVENOUS

## 2014-11-19 NOTE — Patient Instructions (Signed)

## 2014-12-16 ENCOUNTER — Ambulatory Visit (INDEPENDENT_AMBULATORY_CARE_PROVIDER_SITE_OTHER): Payer: Medicare Other | Admitting: Family Medicine

## 2014-12-16 ENCOUNTER — Encounter: Payer: Self-pay | Admitting: Family Medicine

## 2014-12-16 VITALS — BP 127/86 | HR 98 | Ht 65.0 in | Wt 181.0 lb

## 2014-12-16 DIAGNOSIS — G4700 Insomnia, unspecified: Secondary | ICD-10-CM

## 2014-12-16 DIAGNOSIS — F32A Depression, unspecified: Secondary | ICD-10-CM

## 2014-12-16 DIAGNOSIS — F5104 Psychophysiologic insomnia: Secondary | ICD-10-CM | POA: Insufficient documentation

## 2014-12-16 DIAGNOSIS — J019 Acute sinusitis, unspecified: Secondary | ICD-10-CM

## 2014-12-16 DIAGNOSIS — F329 Major depressive disorder, single episode, unspecified: Secondary | ICD-10-CM

## 2014-12-16 MED ORDER — ARIPIPRAZOLE 5 MG PO TABS: 5.0000 mg | ORAL_TABLET | Freq: Every day | ORAL | Status: DC

## 2014-12-16 MED ORDER — AMOXICILLIN-POT CLAVULANATE 875-125 MG PO TABS: 1.0000 | ORAL_TABLET | Freq: Two times a day (BID) | ORAL | Status: DC

## 2014-12-16 NOTE — Progress Notes (Signed)
   Subjective:    Patient ID: Desiree Wilson, female    DOB: Jan 30, 1966, 48 y.o.   MRN: [id number]  HPI 7 days of nasal congestion with brown mucous. Some cough.  Worse at night when lays down.  No fever, chills or sweats. + post nasal drip.  + HA.  No facial pain or pressure.  No change in ears.  Using a decongestant and reobitussion. Not sleeping well.   F/U depression- she's currently on Abilify clonazepam, and Zoloft. Doing well on her current regimen. No S.E.    Insomnia-uses trazodone on her milligrams daily at bedtime.  Hypertension- Pt denies chest pain, SOB, dizziness, or heart palpitations.  Taking meds as directed w/o problems.  Denies medication side effects.     Review of Systems     Objective:   Physical Exam  Constitutional: She is oriented to person, place, and time. She appears well-developed and well-nourished.  HENT:  Head: Normocephalic and atraumatic.  Right Ear: External ear normal.  Left Ear: External ear normal.  Nose: Nose normal.  Mouth/Throat: Oropharynx is clear and moist.  TMs and canals are clear. White nasal mucous.   Eyes: Conjunctivae and EOM are normal. Pupils are equal, round, and reactive to light.  Neck: Neck supple. No thyromegaly present.  Cardiovascular: Normal rate, regular rhythm and normal heart sounds.   Pulmonary/Chest: Effort normal and breath sounds normal. She has no wheezes.  Lymphadenopathy:    She has no cervical adenopathy.  Neurological: She is alert and oriented to person, place, and time.  Skin: Skin is warm and dry.  Psychiatric: She has a normal mood and affect.  Vitals reviewed.         Assessment & Plan:  Acute sinusitis- If not better in 2-3 days then can fill the antibiotic, Augmentin. Call if not better in one week.   Depression-PHQ 9 score of 6 today. She mostly scored symptoms for sleep difficulty low energy and appetite changes. All else were negative. Previous PHQ 9 score was 2.  Insomnia- well  controlled overall on the trazodone.

## 2014-12-17 ENCOUNTER — Ambulatory Visit: Payer: Medicare Other | Admitting: Family Medicine

## 2014-12-21 ENCOUNTER — Telehealth: Payer: Self-pay

## 2014-12-21 ENCOUNTER — Encounter: Payer: Self-pay | Admitting: Hematology & Oncology

## 2014-12-21 ENCOUNTER — Ambulatory Visit (HOSPITAL_BASED_OUTPATIENT_CLINIC_OR_DEPARTMENT_OTHER): Payer: Medicare Other | Admitting: Lab

## 2014-12-21 ENCOUNTER — Ambulatory Visit (HOSPITAL_BASED_OUTPATIENT_CLINIC_OR_DEPARTMENT_OTHER): Payer: Medicare Other | Admitting: Family

## 2014-12-21 DIAGNOSIS — D72829 Elevated white blood cell count, unspecified: Secondary | ICD-10-CM

## 2014-12-21 DIAGNOSIS — R5382 Chronic fatigue, unspecified: Secondary | ICD-10-CM

## 2014-12-21 DIAGNOSIS — D509 Iron deficiency anemia, unspecified: Secondary | ICD-10-CM

## 2014-12-21 DIAGNOSIS — K909 Intestinal malabsorption, unspecified: Secondary | ICD-10-CM

## 2014-12-21 LAB — IRON AND TIBC CHCC
%SAT: 34 % (ref 21–57)
Iron: 93 ug/dL (ref 41–142)
TIBC: 275 ug/dL (ref 236–444)
UIBC: 182 ug/dL (ref 120–384)

## 2014-12-21 LAB — RETICULOCYTES (CHCC)
ABS Retic: 85.3 10*3/uL (ref 19.0–186.0)
RBC.: 5.33 MIL/uL — AB (ref 3.87–5.11)
Retic Ct Pct: 1.6 % (ref 0.4–2.3)

## 2014-12-21 LAB — CBC WITH DIFFERENTIAL (CANCER CENTER ONLY)
BASO#: 0 10*3/uL (ref 0.0–0.2)
BASO%: 0.3 % (ref 0.0–2.0)
EOS%: 1.2 % (ref 0.0–7.0)
Eosinophils Absolute: 0.1 10*3/uL (ref 0.0–0.5)
HCT: 44.1 % (ref 34.8–46.6)
HGB: 14.3 g/dL (ref 11.6–15.9)
LYMPH#: 2.7 10*3/uL (ref 0.9–3.3)
LYMPH%: 27.1 % (ref 14.0–48.0)
MCH: 26.7 pg (ref 26.0–34.0)
MCHC: 32.4 g/dL (ref 32.0–36.0)
MCV: 82 fL (ref 81–101)
MONO#: 0.6 10*3/uL (ref 0.1–0.9)
MONO%: 5.5 % (ref 0.0–13.0)
NEUT#: 6.6 10*3/uL — ABNORMAL HIGH (ref 1.5–6.5)
NEUT%: 65.9 % (ref 39.6–80.0)
PLATELETS: 168 10*3/uL (ref 145–400)
RBC: 5.35 10*6/uL — ABNORMAL HIGH (ref 3.70–5.32)
RDW: 15 % (ref 11.1–15.7)
WBC: 10.1 10*3/uL — ABNORMAL HIGH (ref 3.9–10.0)

## 2014-12-21 LAB — CMP (CANCER CENTER ONLY)
ALK PHOS: 68 U/L (ref 26–84)
ALT: 16 U/L (ref 10–47)
AST: 20 U/L (ref 11–38)
Albumin: 3.4 g/dL (ref 3.3–5.5)
BUN: 16 mg/dL (ref 7–22)
CO2: 20 mEq/L (ref 18–33)
CREATININE: 1.4 mg/dL — AB (ref 0.6–1.2)
Calcium: 9.5 mg/dL (ref 8.0–10.3)
Chloride: 109 mEq/L — ABNORMAL HIGH (ref 98–108)
Glucose, Bld: 101 mg/dL (ref 73–118)
POTASSIUM: 3.9 meq/L (ref 3.3–4.7)
Sodium: 154 mEq/L — ABNORMAL HIGH (ref 128–145)
Total Bilirubin: 0.4 mg/dl (ref 0.20–1.60)
Total Protein: 7.5 g/dL (ref 6.4–8.1)

## 2014-12-21 LAB — FERRITIN CHCC: FERRITIN: 488 ng/mL — AB (ref 9–269)

## 2014-12-21 LAB — CHCC SATELLITE - SMEAR

## 2014-12-21 MED ORDER — FLUCONAZOLE 150 MG PO TABS: 150.0000 mg | ORAL_TABLET | Freq: Once | ORAL | Status: DC

## 2014-12-21 NOTE — Progress Notes (Signed)
[facility]  [phone] [phone]  ID: Desiree Wilson OB: 09/19/1966 MR#: [id number] EUM#:[id number] Patient Care Team: Hali Marry, MD as PCP - General (Family Medicine)  DIAGNOSIS: Iron deficiency anemia Mild leukocytosis Trigeminal neuralgia  INTERVAL HISTORY: Desiree Wilson Is here today for a follow-up. She is feeling tired today. She has her infusion at the pain clinic tomorrow. She has had headaches and an upper respiratory infection (being treated with Augmentin) lately and is not resting well. She is hoping to feel better after tomorrow.  She received Fereheme in November and did not tell much of a difference afterwards. Her Hgb today is 14.3 and MCV 83 WBC 10.1 platelets 168.  She denies fever, chills, n/v, rash, dizziness, SOB, chest pain, palpitations, abdominal pain, constipation, diarrhea, blood in urine or stool.  No swelling, tenderness, numbness or tingling in her extremities.  Her appetite is good and she is staying hydrated. Her weight is stable.  She is taking vitamin D daily and states that she is also going to start taking a multi vitamin daily.   CURRENT TREATMENT: Iron infusions as indicated  REVIEW OF SYSTEMS: All other 10 point review of systems is negative.   PAST MEDICAL HISTORY: Past Medical History  Diagnosis Date  . Allergy     rhinitis  . Depression   . Headache(784.0)   . Hyperlipidemia   . Osteoarthritis   . Facial paralysis     right face/ chronic pain syndrome (pain management Dr Sydell Axon)  . Trigeminal neuralgia     right  . Hypotension   . Blood transfusion   . Anemia   . Iron deficiency anemia 11/15/2014  . Iron malabsorption 11/15/2014    PAST SURGICAL HISTORY: Past Surgical History  Procedure Laterality Date  . Osteotomy    . Mandible surgery    . Plastic facial surgery      FAMILY HISTORY Family History  Problem Relation Age of Onset  . Stroke Father     GYNECOLOGIC HISTORY:   No LMP recorded. Patient is not currently having periods (Reason: Oral contraceptives).   SOCIAL HISTORY: History   Social History  . Marital Status: Single    Spouse Name: N/A    Number of Children: N/A  . Years of Education: N/A   Occupational History  . Not on file.   Social History Main Topics  . Smoking status: Former Smoker -- 6 years    Types: Cigarettes    Quit date: 12/31/2000  . Smokeless tobacco: Not on file  . Alcohol Use: 0.6 oz/week    1 Glasses of wine per week  . Drug Use: Not on file  . Sexual Activity:    Partners: Male   Other Topics Concern  . Not on file   Social History Narrative   Water aerobics several days per week. Lives with her boyfriend.     ADVANCED DIRECTIVES:  <no information>  HEALTH MAINTENANCE: History  Substance Use Topics  . Smoking status: Former Smoker -- 6 years    Types: Cigarettes    Quit date: 12/31/2000  . Smokeless tobacco: Not on file  . Alcohol Use: 0.6 oz/week    1 Glasses of wine per week   Colonoscopy: PAP: Bone density: Lipid panel:  Allergies  Allergen Reactions  . Azithromycin     REACTION: C Diff  . Methadone Hcl Other (See Comments)    REACTION: Hallucinations  . Phenobarbital     REACTION: Excited    Current  Outpatient Prescriptions  Medication Sig Dispense Refill  . AMBULATORY NON FORMULARY MEDICATION Compression sleeve for forearm. 1 Device 0  . amoxicillin-clavulanate (AUGMENTIN) 875-125 MG per tablet Take 1 tablet by mouth 2 (two) times daily. 20 tablet 0  . ARIPiprazole (ABILIFY) 5 MG tablet Take 1 tablet (5 mg total) by mouth daily. 90 tablet 0  . buprenorphine (BUTRANS) 10 MCG/HR PTWK patch Place onto the skin.    . clonazePAM (KLONOPIN) 1 MG tablet Take 1 tablet (1 mg total) by mouth 2 (two) times daily. 30 tablet 2  . frovatriptan (FROVA) 2.5 MG tablet Take 2.5 mg by mouth daily.      Marland Kitchen ketamine (KETALAR) 10 MG/ML injection Inject into the skin.    . mometasone (NASONEX) 50 MCG/ACT  nasal spray Place 2 sprays into the nose daily. 17 g 12  . rosuvastatin (CRESTOR) 10 MG tablet TAKE 1 TABLET BY MOUTH EVERY DAY 90 tablet 3  . sertraline (ZOLOFT) 100 MG tablet TAKE 1 AND 1/2 TABLETS BY MOUTH EVERY DAY 135 tablet 0  . topiramate (TOPAMAX) 100 MG tablet Take by mouth. Take 3 tabs once daily     . traMADol (ULTRAM) 50 MG tablet Take 50 mg by mouth 4 (four) times daily.      . traZODone (DESYREL) 100 MG tablet Take 100 mg by mouth at bedtime. Take 1-2 at bedtime     . verapamil (CALAN-SR) 180 MG CR tablet Take 1 tablet (180 mg total) by mouth daily. 90 tablet 0  . VESTURA 3-0.02 MG tablet TAKE 1 TABLET BY MOUTH DAILY 84 tablet 0   No current facility-administered medications for this visit.    OBJECTIVE: Filed Vitals:   12/21/14 1100  BP: 125/83  Pulse: 92  Temp: 97.6 F (36.4 C)  Resp: 18    Filed Weights   12/21/14 1100  Weight: 177 lb (80.287 kg)   ECOG FS:1 - Symptomatic but completely ambulatory Ocular: Sclerae unicteric, pupils equal, round and reactive to light Ear-nose-throat: Oropharynx clear, dentition fair Lymphatic: No cervical or supraclavicular adenopathy Lungs no rales or rhonchi, good excursion bilaterally Heart regular rate and rhythm, no murmur appreciated Abd soft, nontender, positive bowel sounds MSK no focal spinal tenderness, no joint edema Neuro: non-focal, well-oriented, appropriate affect Breasts: Deferred  LAB RESULTS: CMP     Component Value Date/Time   NA 154* 12/21/2014 1024   NA 136 08/25/2014 0905   K 3.9 12/21/2014 1024   K 3.9 08/25/2014 0905   CL 109* 12/21/2014 1024   CL 109 08/25/2014 0905   CO2 20 12/21/2014 1024   CO2 17* 08/25/2014 0905   GLUCOSE 101 12/21/2014 1024   GLUCOSE 88 08/25/2014 0905   BUN 16 12/21/2014 1024   BUN 19 08/25/2014 0905   BUN 21 11/14/2012   CREATININE 1.4* 12/21/2014 1024   CREATININE 1.30* 03/27/2013 1725   CREATININE 1.3* 11/14/2012   CALCIUM 9.5 12/21/2014 1024   CALCIUM 8.9  08/25/2014 0905   PROT 7.5 12/21/2014 1024   PROT 6.6 08/25/2014 0905   ALBUMIN 3.7 08/25/2014 0905   AST 20 12/21/2014 1024   AST 15 08/25/2014 0905   ALT 16 12/21/2014 1024   ALT 11 08/25/2014 0905   ALKPHOS 68 12/21/2014 1024   ALKPHOS 73 08/25/2014 0905   BILITOT 0.40 12/21/2014 1024   BILITOT 0.3 08/25/2014 0905   GFRNONAA 49* 08/25/2014 0905   GFRNONAA 48* 03/27/2013 1725   GFRAA 56* 08/25/2014 0905   GFRAA 56* 03/27/2013 1725  INo results found for: SPEP, UPEP Lab Results  Component Value Date   WBC 10.1* 12/21/2014   NEUTROABS 6.6* 12/21/2014   HGB 14.3 12/21/2014   HCT 44.1 12/21/2014   MCV 82 12/21/2014   PLT 168 12/21/2014   No results found for: LABCA2 No components found for: VOPFY924 No results for input(s): INR in the last 168 hours.  STUDIES: No results found.  ASSESSMENT/PLAN: Desiree Wilson is a very pleasant 48 year old white female with mild leukocytosis. Her WBc today is 10.1. She also has some iron deficiency anemia. She last received Fereheme in November. Her hgb is 14.3 MCV 82. She is still feeling tired. She feels that this is due to her trigeminal neuralgia. She goes to the pain clininc tomorrow.   We will see what her iron studies show.  We will see her back in 3 months for labs and follow-up.  She knows to call here with any questions or concerns and to go to the ED in the event of an emergency. We can certainly see her sooner if need be.   Eliezer Bottom, NP 12/21/2014 11:39 AM

## 2014-12-21 NOTE — Telephone Encounter (Signed)
Left detailed message.   

## 2014-12-21 NOTE — Telephone Encounter (Signed)
Desiree Wilson reports vaginal itching and white discharge for 2 days from antibiotic use. Denies fever, chills or sweats. She states she has tried OTC medication with no relief. She would like diflucan sent to [facility]. [address].

## 2014-12-21 NOTE — Telephone Encounter (Signed)
Rx sent 

## 2015-01-03 ENCOUNTER — Emergency Department (HOSPITAL_BASED_OUTPATIENT_CLINIC_OR_DEPARTMENT_OTHER)
Admission: EM | Admit: 2015-01-03 | Discharge: 2015-01-03 | Disposition: A | Discharge status: Home / Self Care | Payer: Medicare Other | Attending: Emergency Medicine | Admitting: Emergency Medicine

## 2015-01-03 ENCOUNTER — Emergency Department (HOSPITAL_BASED_OUTPATIENT_CLINIC_OR_DEPARTMENT_OTHER): Payer: Medicare Other

## 2015-01-03 ENCOUNTER — Encounter (HOSPITAL_BASED_OUTPATIENT_CLINIC_OR_DEPARTMENT_OTHER): Payer: Self-pay

## 2015-01-03 DIAGNOSIS — Y998 Other external cause status: Secondary | ICD-10-CM | POA: Diagnosis not present

## 2015-01-03 DIAGNOSIS — Z87891 Personal history of nicotine dependence: Secondary | ICD-10-CM | POA: Diagnosis not present

## 2015-01-03 DIAGNOSIS — W5501XA Bitten by cat, initial encounter: Secondary | ICD-10-CM | POA: Diagnosis not present

## 2015-01-03 DIAGNOSIS — Z792 Long term (current) use of antibiotics: Secondary | ICD-10-CM | POA: Diagnosis not present

## 2015-01-03 DIAGNOSIS — Z880 Allergy status to penicillin: Secondary | ICD-10-CM | POA: Diagnosis not present

## 2015-01-03 DIAGNOSIS — S61251A Open bite of left index finger without damage to nail, initial encounter: Secondary | ICD-10-CM | POA: Diagnosis present

## 2015-01-03 DIAGNOSIS — Z7952 Long term (current) use of systemic steroids: Secondary | ICD-10-CM | POA: Insufficient documentation

## 2015-01-03 DIAGNOSIS — Y9289 Other specified places as the place of occurrence of the external cause: Secondary | ICD-10-CM | POA: Insufficient documentation

## 2015-01-03 DIAGNOSIS — Y9389 Activity, other specified: Secondary | ICD-10-CM | POA: Diagnosis not present

## 2015-01-03 DIAGNOSIS — T148XXA Other injury of unspecified body region, initial encounter: Secondary | ICD-10-CM

## 2015-01-03 MED ORDER — AMOXICILLIN-POT CLAVULANATE 875-125 MG PO TABS
1.0000 | ORAL_TABLET | Freq: Once | ORAL | Status: AC
  Administered 2015-01-03: 1 via ORAL
  Filled 2015-01-03: qty 1

## 2015-01-03 MED ORDER — AMOXICILLIN-POT CLAVULANATE 875-125 MG PO TABS: 1.0000 | ORAL_TABLET | Freq: Two times a day (BID) | ORAL | Status: DC

## 2015-01-03 MED ORDER — FLUCONAZOLE 150 MG PO TABS: 150.0000 mg | ORAL_TABLET | Freq: Every day | ORAL | Status: DC

## 2015-01-03 NOTE — ED Provider Notes (Signed)
CSN: [id number]     Arrival date & time 01/03/15  0102 History   First MD Initiated Contact with Patient 01/03/15 [phone]     Chief Complaint  Patient presents with  . Animal Bite     (Consider location/radiation/quality/duration/timing/severity/associated sxs/prior Treatment) HPI Comments: Patient presents with cat bite to left index finger sustained last night. It was by her own 49 year old cat Whose shots are up-to-date. She denies any weakness, numbness, tingling. Her tetanus shot is up-to-date. She denies any fevers, chills, nausea vomiting or bleeding or discharge.  The history is provided by the patient.    Past Medical History  Diagnosis Date  . Allergy     rhinitis  . Depression   . Headache(784.0)   . Hyperlipidemia   . Osteoarthritis   . Facial paralysis     right face/ chronic pain syndrome (pain management Dr Sydell Axon)  . Trigeminal neuralgia     right  . Hypotension   . Blood transfusion   . Anemia   . Iron deficiency anemia 11/15/2014  . Iron malabsorption 11/15/2014   Past Surgical History  Procedure Laterality Date  . Osteotomy    . Mandible surgery    . Plastic facial surgery     Family History  Problem Relation Age of Onset  . Stroke Father    History  Substance Use Topics  . Smoking status: Former Smoker -- 6 years    Types: Cigarettes    Quit date: 12/31/2000  . Smokeless tobacco: Not on file  . Alcohol Use: 0.6 oz/week    1 Glasses of wine per week   OB History    No data available     Review of Systems  Constitutional: Negative for fever, activity change and appetite change.  Respiratory: Negative for cough, chest tightness and shortness of breath.   Gastrointestinal: Negative for nausea, vomiting and abdominal pain.  Genitourinary: Negative for dysuria and hematuria.  Musculoskeletal: Negative for myalgias and arthralgias.  Skin: Positive for wound.  A complete 10 system review of systems was obtained and all systems are negative except  as noted in the HPI and PMH.      Allergies  Azithromycin; Methadone hcl; and Phenobarbital  Home Medications   Prior to Admission medications   Medication Sig Start Date End Date Taking? Authorizing Provider  AMBULATORY NON FORMULARY MEDICATION Compression sleeve for forearm. 03/27/13   Jade L Breeback, PA-C  amoxicillin-clavulanate (AUGMENTIN) 875-125 MG per tablet Take 1 tablet by mouth 2 (two) times daily. 12/16/14   Hali Marry, MD  ARIPiprazole (ABILIFY) 5 MG tablet Take 1 tablet (5 mg total) by mouth daily. 12/16/14   Hali Marry, MD  buprenorphine Haze Rushing) 10 MCG/HR PTWK patch Place onto the skin.    Historical Provider, MD  clonazePAM (KLONOPIN) 1 MG tablet Take 1 tablet (1 mg total) by mouth 2 (two) times daily. 06/11/11   Collene Leyden Bowen, DO  fluconazole (DIFLUCAN) 150 MG tablet Take 1 tablet (150 mg total) by mouth once. 12/21/14   Hali Marry, MD  frovatriptan (FROVA) 2.5 MG tablet Take 2.5 mg by mouth daily.      Historical Provider, MD  ketamine (KETALAR) 10 MG/ML injection Inject into the skin.    Historical Provider, MD  mometasone (NASONEX) 50 MCG/ACT nasal spray Place 2 sprays into the nose daily. 01/28/14   Hali Marry, MD  rosuvastatin (CRESTOR) 10 MG tablet TAKE 1 TABLET BY MOUTH EVERY DAY 08/17/14   Hali Marry, MD  sertraline (ZOLOFT) 100 MG tablet TAKE 1 AND 1/2 TABLETS BY MOUTH EVERY DAY 09/29/14   Hali Marry, MD  topiramate (TOPAMAX) 100 MG tablet Take by mouth. Take 3 tabs once daily     Historical Provider, MD  traMADol (ULTRAM) 50 MG tablet Take 50 mg by mouth 4 (four) times daily.      Historical Provider, MD  traZODone (DESYREL) 100 MG tablet Take 100 mg by mouth at bedtime. Take 1-2 at bedtime     Historical Provider, MD  verapamil (CALAN-SR) 180 MG CR tablet Take 1 tablet (180 mg total) by mouth daily. 02/02/13   Hali Marry, MD  VESTURA 3-0.02 MG tablet TAKE 1 TABLET BY MOUTH DAILY 11/08/14    Hali Marry, MD   BP 115/78 mmHg  Pulse 85  Temp(Src) 97.9 F (36.6 C) (Oral)  Resp 16  Ht 5\' 5"  (1.651 m)  Wt 173 lb (78.472 kg)  BMI 28.79 kg/m2  SpO2 99% Physical Exam  Constitutional: She is oriented to person, place, and time. She appears well-developed and well-nourished. No distress.  HENT:  Head: Normocephalic and atraumatic.  Mouth/Throat: Oropharynx is clear and moist. No oropharyngeal exudate.  Eyes: Conjunctivae and EOM are normal. Pupils are equal, round, and reactive to light.  Neck: Normal range of motion. Neck supple.  No meningismus.  Cardiovascular: Normal rate, regular rhythm, normal heart sounds and intact distal pulses.   No murmur heard. Pulmonary/Chest: Effort normal and breath sounds normal. No respiratory distress.  Abdominal: Soft. There is no tenderness. There is no rebound and no guarding.  Musculoskeletal: Normal range of motion. She exhibits no edema or tenderness.  Neurological: She is alert and oriented to person, place, and time. No cranial nerve deficit. She exhibits normal muscle tone. Coordination normal.  No ataxia on finger to nose bilaterally. No pronator drift. 5/5 strength throughout. CN 2-12 intact. Negative Romberg. Equal grip strength. Sensation intact. Gait is normal.   Skin: Skin is warm.  Small puncture wounds to middle phalanx of left second digit on plantar and volar surface. Slight surrounding erythema. Full range of motion of PIP, DIP, MCP joints. Intact distal pulses  Psychiatric: She has a normal mood and affect. Her behavior is normal.  Nursing note and vitals reviewed.   ED Course  Procedures (including critical care time) Labs Review Labs Reviewed - No data to display  Imaging Review Dg Finger Index Right  01/03/2015   CLINICAL DATA:  RIGHT index pain and swelling after animal bite.  EXAM: RIGHT INDEX FINGER 2+V  COMPARISON:  None.  FINDINGS: There is no evidence of fracture or dislocation. There is no evidence of  arthropathy or other focal bone abnormality. Mild soft tissue swelling.  IMPRESSION: Negative for fracture.   Electronically Signed   By: Rolla Flatten M.D.   On: 01/03/2015 07:40     EKG Interpretation None      MDM   Final diagnoses:  Animal bite    Cat Bite to left index finger. Vaccines up-to-date. Tetanus up-to-date.  X-ray negative. Wounds cleaned. Start Augmentin. Follow-up with PCP and hand surgery as needed. Return to ED with worsening redness, pain, other concerns.   Ezequiel Essex, MD 01/03/15 [phone]

## 2015-01-03 NOTE — ED Notes (Signed)
Pt bit by own cat last night. Redness to right 2nd digit. Rabies UTD. Tetanus UTD.

## 2015-01-03 NOTE — Discharge Instructions (Signed)

## 2015-01-18 ENCOUNTER — Other Ambulatory Visit: Payer: Self-pay

## 2015-01-18 MED ORDER — SERTRALINE HCL 100 MG PO TABS: 150.0000 mg | ORAL_TABLET | Freq: Every day | ORAL | Status: DC

## 2015-01-18 MED ORDER — DROSPIRENONE-ETHINYL ESTRADIOL 3-0.02 MG PO TABS: 1.0000 | ORAL_TABLET | Freq: Every day | ORAL | Status: DC

## 2015-03-17 ENCOUNTER — Ambulatory Visit (INDEPENDENT_AMBULATORY_CARE_PROVIDER_SITE_OTHER): Payer: Medicare Other | Admitting: Family Medicine

## 2015-03-17 ENCOUNTER — Encounter: Payer: Self-pay | Admitting: Family Medicine

## 2015-03-17 VITALS — BP 120/79 | HR 90 | Wt 181.0 lb

## 2015-03-17 DIAGNOSIS — R350 Frequency of micturition: Secondary | ICD-10-CM

## 2015-03-17 DIAGNOSIS — I10 Essential (primary) hypertension: Secondary | ICD-10-CM

## 2015-03-17 DIAGNOSIS — E785 Hyperlipidemia, unspecified: Secondary | ICD-10-CM

## 2015-03-17 DIAGNOSIS — N183 Chronic kidney disease, stage 3 unspecified: Secondary | ICD-10-CM

## 2015-03-17 MED ORDER — ARIPIPRAZOLE 5 MG PO TABS: 5.0000 mg | ORAL_TABLET | Freq: Every day | ORAL | Status: DC

## 2015-03-17 NOTE — Progress Notes (Signed)
   Subjective:    Patient ID: Desiree Wilson, female    DOB: 21-Jul-1966, 49 y.o.   MRN: [id number]  HPI Hypertension- Pt denies chest pain, SOB, dizziness, or heart palpitations.  Taking meds as directed w/o problems.  Denies medication side effects.    F/U depression - She is on sertraline 100mg  and abilify. She feels like she went into a depression recently bc her ketamine infusion was delayed by 3 weeks and then her pain increased and this really affected her mood for several weeks. She feels like she is back on track with her mood. infact denies any depressive sxs.  Still having some sleep issues.  She is now taking trazodone 300 mg nightly.    CKD - She follows with Dr. Elmarie Wilson.    Urinating more frequently for about 2 months.  Today had pain in the RLQ after she urinated. No blood or dysuria.  No fever with it.   Hyperlpidemia - doing well on her crestor.  No myaglias or S.E.    Review of Systems     Objective:   Physical Exam  Constitutional: She is oriented to person, place, and time. She appears well-developed and well-nourished.  HENT:  Head: Normocephalic and atraumatic.  Neck: Neck supple.  Cardiovascular: Normal rate, regular rhythm and normal heart sounds.   Pulmonary/Chest: Effort normal and breath sounds normal.  Neurological: She is alert and oriented to person, place, and time.  Skin: Skin is warm and dry.  Psychiatric: She has a normal mood and affect. Her behavior is normal.          Assessment & Plan:  HTN - Well controlled.  Continue current regimen. F/U in 6 months.   Depression - Dong well overall. Still struggling with sleep issues.  Continue current regimen. Refill the abilify.    CKD3 - follows with Nephrology.   Hyperlpidemia - well controlled on regimen. Due to recheck in labs in 6 months.    Urinary freq- will check UA.

## 2015-03-22 ENCOUNTER — Other Ambulatory Visit: Payer: Medicare Other | Admitting: Lab

## 2015-03-22 ENCOUNTER — Ambulatory Visit: Payer: Medicare Other | Admitting: Hematology & Oncology

## 2015-06-01 ENCOUNTER — Other Ambulatory Visit: Payer: Self-pay | Admitting: Family Medicine

## 2015-06-17 ENCOUNTER — Other Ambulatory Visit: Payer: Self-pay | Admitting: Family Medicine

## 2015-06-17 ENCOUNTER — Ambulatory Visit (INDEPENDENT_AMBULATORY_CARE_PROVIDER_SITE_OTHER): Payer: Medicare Other | Admitting: Family Medicine

## 2015-06-17 ENCOUNTER — Encounter: Payer: Self-pay | Admitting: Family Medicine

## 2015-06-17 VITALS — BP 113/73 | HR 76 | Wt 180.0 lb

## 2015-06-17 DIAGNOSIS — F32A Depression, unspecified: Secondary | ICD-10-CM

## 2015-06-17 DIAGNOSIS — F329 Major depressive disorder, single episode, unspecified: Secondary | ICD-10-CM

## 2015-06-17 DIAGNOSIS — G4700 Insomnia, unspecified: Secondary | ICD-10-CM | POA: Diagnosis not present

## 2015-06-17 MED ORDER — ZOLPIDEM TARTRATE 5 MG PO TABS: 5.0000 mg | ORAL_TABLET | Freq: Every evening | ORAL | Status: DC | PRN

## 2015-06-17 NOTE — Patient Instructions (Signed)
Insomnia Insomnia is frequent trouble falling and/or staying asleep. Insomnia can be a long term problem or a short term problem. Both are common. Insomnia can be a short term problem when the wakefulness is related to a certain stress or worry. Long term insomnia is often related to ongoing stress during waking hours and/or poor sleeping habits. Overtime, sleep deprivation itself can make the problem worse. Every little thing feels more severe because you are overtired and your ability to cope is decreased. CAUSES   Stress, anxiety, and depression.  Poor sleeping habits.  Distractions such as TV in the bedroom.  Naps close to bedtime.  Engaging in emotionally charged conversations before bed.  Technical reading before sleep.  Alcohol and other sedatives. They may make the problem worse. They can hurt normal sleep patterns and normal dream activity.  Stimulants such as caffeine for several hours prior to bedtime.  Pain syndromes and shortness of breath can cause insomnia.  Exercise late at night.  Changing time zones may cause sleeping problems (jet lag). It is sometimes helpful to have someone observe your sleeping patterns. They should look for periods of not breathing during the night (sleep apnea). They should also look to see how long those periods last. If you live alone or observers are uncertain, you can also be observed at a sleep clinic where your sleep patterns will be professionally monitored. Sleep apnea requires a checkup and treatment. Give your caregivers your medical history. Give your caregivers observations your family has made about your sleep.  SYMPTOMS   Not feeling rested in the morning.  Anxiety and restlessness at bedtime.  Difficulty falling and staying asleep. TREATMENT   Your caregiver may prescribe treatment for an underlying medical disorders. Your caregiver can give advice or help if you are using alcohol or other drugs for self-medication. Treatment  of underlying problems will usually eliminate insomnia problems.  Medications can be prescribed for short time use. They are generally not recommended for lengthy use.  Over-the-counter sleep medicines are not recommended for lengthy use. They can be habit forming.  You can promote easier sleeping by making lifestyle changes such as:  Using relaxation techniques that help with breathing and reduce muscle tension.  Exercising earlier in the day.  Changing your diet and the time of your last meal. No night time snacks.  Establish a regular time to go to bed.  Counseling can help with stressful problems and worry.  Soothing music and white noise may be helpful if there are background noises you cannot remove.  Stop tedious detailed work at least one hour before bedtime. HOME CARE INSTRUCTIONS   Keep a diary. Inform your caregiver about your progress. This includes any medication side effects. See your caregiver regularly. Take note of:  Times when you are asleep.  Times when you are awake during the night.  The quality of your sleep.  How you feel the next day. This information will help your caregiver care for you.  Get out of bed if you are still awake after 15 minutes. Read or do some quiet activity. Keep the lights down. Wait until you feel sleepy and go back to bed.  Keep regular sleeping and waking hours. Avoid naps.  Exercise regularly.  Avoid distractions at bedtime. Distractions include watching television or engaging in any intense or detailed activity like attempting to balance the household checkbook.  Develop a bedtime ritual. Keep a familiar routine of bathing, brushing your teeth, climbing into bed at the same   time each night, listening to soothing music. Routines increase the success of falling to sleep faster.  Use relaxation techniques. This can be using breathing and muscle tension release routines. It can also include visualizing peaceful scenes. You can  also help control troubling or intruding thoughts by keeping your mind occupied with boring or repetitive thoughts like the old concept of counting sheep. You can make it more creative like imagining planting one beautiful flower after another in your backyard garden.  During your day, work to eliminate stress. When this is not possible use some of the previous suggestions to help reduce the anxiety that accompanies stressful situations. MAKE SURE YOU:   Understand these instructions.  Will watch your condition.  Will get help right away if you are not doing well or get worse. Document Released: 12/14/2000 Document Revised: 03/10/2012 Document Reviewed: 01/14/2008 Exit[employer] Patient Information 2015 [employer]. This information is not intended to replace advice given to you by your health care provider. Make sure you discuss any questions you have with your health care provider.  

## 2015-06-17 NOTE — Progress Notes (Addendum)
   Subjective:    Patient ID: Desiree Wilson, female    DOB: Nov 22, 1966, 49 y.o.   MRN: [id number]  HPI Depression - she is on zoloft and Abilify. She is tolerating her medication well. She did take extra zoloft for about 2 weeks when her pain was really high and it really depresses her when this happens. Biggest concern is her sleep.    Insomnia - hs been trazodone for sleep and not really helping. Has been on it for 10 years and really just not helping.  She does sleep with the animals.    Review of Systems     Objective:   Physical Exam  Constitutional: She is oriented to person, place, and time. She appears well-developed and well-nourished.  HENT:  Head: Normocephalic and atraumatic.  Cardiovascular: Normal rate, regular rhythm and normal heart sounds.   Pulmonary/Chest: Effort normal and breath sounds normal.  Neurological: She is alert and oriented to person, place, and time.  Skin: Skin is warm and dry.  Psychiatric: She has a normal mood and affect. Her behavior is normal.          Assessment & Plan:  Depression - PHQ 9 score of 6.  3 of the points were for sleep issue. Otherwise she's doing well. Continue current regimen. Follow-up in 3 months.  Insomnia - we discussed some options. She would like to retry Ambien. She had tried it years ago and thought it wasn't very effective but would be willing to retry it. She also tried [place] and felt it was not effective either. Could consider Belsomra if the Ambien is not effective. Though I suspect insurance coverage would be poor.

## 2015-06-20 NOTE — Telephone Encounter (Signed)
Is the pt still taking this along with the zoloft?

## 2015-07-18 DIAGNOSIS — R9089 Other abnormal findings on diagnostic imaging of central nervous system: Secondary | ICD-10-CM | POA: Insufficient documentation

## 2015-07-22 ENCOUNTER — Telehealth: Payer: Self-pay | Admitting: Family Medicine

## 2015-07-22 DIAGNOSIS — D7589 Other specified diseases of blood and blood-forming organs: Secondary | ICD-10-CM

## 2015-07-22 DIAGNOSIS — R9389 Abnormal findings on diagnostic imaging of other specified body structures: Secondary | ICD-10-CM

## 2015-07-22 DIAGNOSIS — R937 Abnormal findings on diagnostic imaging of other parts of musculoskeletal system: Secondary | ICD-10-CM

## 2015-07-22 NOTE — Telephone Encounter (Addendum)
Please call patient: I received some information from her orthopedist. Because they had noted some bone marrow edema of the spine they had recommended that she have some additional blood work including a CBC with differential, and a BMP, SPEP and UPEP. If she's okay with going ahead and getting this blood work before her appointment in September then we can order this and she can go anytime.

## 2015-07-26 ENCOUNTER — Ambulatory Visit: Payer: Medicare Other | Attending: Specialist | Admitting: Physical Therapy

## 2015-07-26 DIAGNOSIS — R293 Abnormal posture: Secondary | ICD-10-CM | POA: Insufficient documentation

## 2015-07-26 DIAGNOSIS — M25512 Pain in left shoulder: Secondary | ICD-10-CM | POA: Diagnosis present

## 2015-07-26 DIAGNOSIS — M5412 Radiculopathy, cervical region: Secondary | ICD-10-CM | POA: Insufficient documentation

## 2015-07-26 DIAGNOSIS — M7542 Impingement syndrome of left shoulder: Secondary | ICD-10-CM

## 2015-07-26 DIAGNOSIS — M542 Cervicalgia: Secondary | ICD-10-CM | POA: Diagnosis present
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
## 2015-07-27 NOTE — Telephone Encounter (Signed)
Pt informed and is agreeable to this.Cory Kitt, [place] placed did not associate unsure of dx code to be used. Will fwd to pcp for f/u.Audelia Hives [place]

## 2015-07-28 ENCOUNTER — Other Ambulatory Visit: Payer: Self-pay | Admitting: Family Medicine

## 2015-07-28 DIAGNOSIS — R9389 Abnormal findings on diagnostic imaging of other specified body structures: Secondary | ICD-10-CM

## 2015-07-28 DIAGNOSIS — D7589 Other specified diseases of blood and blood-forming organs: Secondary | ICD-10-CM

## 2015-07-28 DIAGNOSIS — R937 Abnormal findings on diagnostic imaging of other parts of musculoskeletal system: Secondary | ICD-10-CM

## 2015-07-28 NOTE — Telephone Encounter (Signed)
Dx associated and labs printed.

## 2015-08-01 ENCOUNTER — Ambulatory Visit: Payer: Medicare Other | Attending: Specialist | Admitting: Physical Therapy

## 2015-08-01 DIAGNOSIS — M542 Cervicalgia: Secondary | ICD-10-CM | POA: Insufficient documentation

## 2015-08-01 DIAGNOSIS — M5412 Radiculopathy, cervical region: Secondary | ICD-10-CM | POA: Diagnosis present

## 2015-08-01 DIAGNOSIS — M7542 Impingement syndrome of left shoulder: Secondary | ICD-10-CM | POA: Insufficient documentation

## 2015-08-01 DIAGNOSIS — R293 Abnormal posture: Secondary | ICD-10-CM | POA: Diagnosis present

## 2015-08-01 DIAGNOSIS — M25512 Pain in left shoulder: Secondary | ICD-10-CM

## 2015-08-01 NOTE — Therapy (Signed)
[facility] [address]  [place], [place], [address] Phone: [phone]   Fax:  [phone]  Physical Therapy Treatment  Patient Details  Name: Desiree Wilson MRN: [id number] Date of Birth: 09/20/1966 Referring Provider:  Susa Day, MD  Encounter Date: 08/01/2015      PT End of Session - 08/01/15 0944    Visit Number 2   Number of Visits 16   Date for PT Re-Evaluation 09/20/15   PT Start Time 0935   PT Stop Time 1019   PT Time Calculation (min) 44 min   Activity Tolerance Patient limited by pain   Behavior During Therapy [facility] for tasks assessed/performed      Past Medical History  Diagnosis Date  . Allergy     rhinitis  . Depression   . Headache(784.0)   . Hyperlipidemia   . Osteoarthritis   . Facial paralysis     right face/ chronic pain syndrome (pain management Dr Sydell Axon)  . Trigeminal neuralgia     right  . Hypotension   . Blood transfusion   . Anemia   . Iron deficiency anemia 11/15/2014  . Iron malabsorption 11/15/2014    Past Surgical History  Procedure Laterality Date  . Osteotomy    . Mandible surgery    . Plastic facial surgery      There were no vitals filed for this visit.  Visit Diagnosis:  Shoulder impingement, left  Left shoulder pain  Cervical radiculitis  Cervical muscle pain  Posture abnormality      Subjective Assessment - 08/01/15 0936    Subjective Patient reports taking increased pain meds secondary to dental work (crown) but it's also helping her neck and shoulder pain. Reports improving ability to do "forward work" (activities out in front of her) and improved grip when picking things up. Reports improved awareness of posture and uses postural HEP exercises as break during activties (painting and driving), but unable to complete TB exercises as dog ate her theraband.   Currently in Pain? Yes   Pain Score 6    Pain Location Shoulder   Pain Orientation  Left;Proximal;Upper;Posterior   Pain Descriptors / Indicators Sharp   Pain Score 6   Pain Location Neck   Pain Orientation Left;Lateral   Pain Descriptors / Indicators Tightness           Today's Treatment   There Ex- UBE - lvl 1 2.5' fwd, back deferred after 30" d/t increased pain Seated chin tuck/cervical retraction x10 Scapular retraction/depression x10 Seated stretches - upper trap & levator 20"x3 bilaterally Supine chest stretch hooklying on 1/2 foam roll with arms horizontally abducted at 90 degrees x 2 min Shoulder flexion pullover 0# x 5 (limited by left lateral shoulder pain) Shoulder horiz abduction hooklying on 1/2 foam roll with yellow TB x 10 Low row with red TB 2x10 with 3" hold    Manual Tx- STM to upper trap, levator Joint mobs - left shoulder - distraction, inferior glide, AP           PT Education - 08/01/15 1113    Education provided Yes   Education Details Upper trap & levator stretches, supine horizontal abd/adduction added to HEP (patient declined handout)   Person(s) Educated Patient   Methods Explanation;Demonstration   Comprehension Verbalized understanding;Returned demonstration          PT Short Term Goals - 08/01/15 1119    PT SHORT TERM GOAL #1   Title Independent  with initial HEP (08/23/15)   Status On-going           PT Long Term Goals - 08/01/15 1119    PT LONG TERM GOAL #1   Title Independent with advanced HEP (09/20/15)   Status On-going   PT LONG TERM GOAL #2   Title Cervical ROM to within 10 degrees of normal without increased pain (09/20/15)   Status On-going   PT LONG TERM GOAL #3   Title Left shoulder ROM to within 10 degrees of right shoulder without increased pain (09/20/15)   Status On-going   PT LONG TERM GOAL #4   Title Patient able to reach behind back without pain adequate to complete dressing without assistance (09/20/15)   Status On-going   PT LONG TERM GOAL #5   Title Perform hobbies such as window  glass beading with avg pain </= 2/10 (09/20/15)   Status On-going               Plan - 08/01/15 1116    Clinical Impression Statement Overhead ROM restricted by pain and substitution with poor postural aligment/body mechanics requiring repeated cues for proper tecnhique, thus limiting progression of activities/exercises.   PT Next Visit Plan Review of initial HEP with upgrade as appropriate, manual therapy, stretching, postural training, ROM/strengthening, modalities PRN   PT Home Exercise Plan Progressive HEP with weekly updates due to financial constraints.   Consulted and Agree with Plan of Care Patient        Problem List Patient Active Problem List   Diagnosis Date Noted  . Insomnia 12/16/2014  . Iron deficiency anemia 11/15/2014  . Iron malabsorption 11/15/2014  . CKD (chronic kidney disease) stage 3, GFR 30-59 ml/min 08/26/2014  . Trigeminal neuralgia 07/22/2012  . Essential hypertension, benign 04/13/2011  . ANKLE PAIN, LEFT 03/06/2011  . FATIGUE 08/10/2010  . HYPERSOMNIA 08/09/2009  . Hyperlipidemia 07/05/2009  . Depression 07/05/2009  . ALLERGIC RHINITIS 07/05/2009  . OSTEOARTHRITIS 07/05/2009  . HEADACHE 07/05/2009    Percival Spanish, PT, MPT 08/01/2015, 11:23 AM  [facility] [place], [place], [address] Phone: [phone]   Fax:  [phone]

## 2015-08-05 ENCOUNTER — Other Ambulatory Visit: Payer: Self-pay | Admitting: Family Medicine

## 2015-08-06 LAB — BASIC METABOLIC PANEL
BUN: 17 mg/dL (ref 7–25)
CALCIUM: 9.1 mg/dL (ref 8.6–10.2)
CO2: 21 mmol/L (ref 20–31)
Chloride: 107 mmol/L (ref 98–110)
Creat: 1.24 mg/dL — ABNORMAL HIGH (ref 0.50–1.10)
GLUCOSE: 97 mg/dL (ref 65–99)
POTASSIUM: 3.9 mmol/L (ref 3.5–5.3)
Sodium: 137 mmol/L (ref 135–146)

## 2015-08-06 LAB — CBC WITH DIFFERENTIAL/PLATELET
BASOS PCT: 0 % (ref 0–1)
Basophils Absolute: 0 10*3/uL (ref 0.0–0.1)
Eosinophils Absolute: 0.1 10*3/uL (ref 0.0–0.7)
Eosinophils Relative: 1 % (ref 0–5)
HEMATOCRIT: 41.8 % (ref 36.0–46.0)
Hemoglobin: 13.3 g/dL (ref 12.0–15.0)
LYMPHS ABS: 2.5 10*3/uL (ref 0.7–4.0)
Lymphocytes Relative: 21 % (ref 12–46)
MCH: 26.6 pg (ref 26.0–34.0)
MCHC: 31.8 g/dL (ref 30.0–36.0)
MCV: 83.6 fL (ref 78.0–100.0)
MPV: 11.3 fL (ref 8.6–12.4)
Monocytes Absolute: 0.5 10*3/uL (ref 0.1–1.0)
Monocytes Relative: 4 % (ref 3–12)
NEUTROS ABS: 8.7 10*3/uL — AB (ref 1.7–7.7)
Neutrophils Relative %: 74 % (ref 43–77)
Platelets: 183 10*3/uL (ref 150–400)
RBC: 5 MIL/uL (ref 3.87–5.11)
RDW: 15.5 % (ref 11.5–15.5)
WBC: 11.7 10*3/uL — ABNORMAL HIGH (ref 4.0–10.5)

## 2015-08-08 ENCOUNTER — Ambulatory Visit: Payer: Medicare Other | Admitting: Physical Therapy

## 2015-08-09 LAB — PROTEIN ELECTROPHORESIS, SERUM
ALPHA-1-GLOBULIN: 0.4 g/dL — AB (ref 0.2–0.3)
Albumin ELP: 3.6 g/dL — ABNORMAL LOW (ref 3.8–4.8)
Alpha-2-Globulin: 1.1 g/dL — ABNORMAL HIGH (ref 0.5–0.9)
Beta 2: 0.4 g/dL (ref 0.2–0.5)
Beta Globulin: 0.5 g/dL (ref 0.4–0.6)
Gamma Globulin: 0.7 g/dL — ABNORMAL LOW (ref 0.8–1.7)
TOTAL PROTEIN, SERUM ELECTROPHOR: 6.5 g/dL (ref 6.1–8.1)

## 2015-08-11 LAB — IMMUNOFIXATION ELECTROPHORESIS
IGG (IMMUNOGLOBIN G), SERUM: 750 mg/dL (ref 690–1700)
IGM, SERUM: 77 mg/dL (ref 52–322)
IgA: 155 mg/dL (ref 69–380)
TOTAL PROTEIN, SERUM ELECTROPHOR: 6.6 g/dL (ref 6.0–8.3)

## 2015-08-15 ENCOUNTER — Ambulatory Visit: Payer: Medicare Other | Admitting: Physical Therapy

## 2015-08-15 DIAGNOSIS — M25512 Pain in left shoulder: Secondary | ICD-10-CM

## 2015-08-15 DIAGNOSIS — R293 Abnormal posture: Secondary | ICD-10-CM

## 2015-08-15 DIAGNOSIS — M5412 Radiculopathy, cervical region: Secondary | ICD-10-CM

## 2015-08-15 DIAGNOSIS — M542 Cervicalgia: Secondary | ICD-10-CM

## 2015-08-15 DIAGNOSIS — M7542 Impingement syndrome of left shoulder: Secondary | ICD-10-CM | POA: Diagnosis not present

## 2015-08-15 NOTE — Therapy (Signed)
[facility] [address]  [place], [place], [address] Phone: [phone]   Fax:  [phone]  Physical Therapy Treatment  Patient Details  Name: Desiree Wilson MRN: [id number] Date of Birth: 08-Mar-1966 Referring Provider:  Susa Day, MD  Encounter Date: 08/15/2015      PT End of Session - 08/15/15 1548    Visit Number 3   Number of Visits 16   Date for PT Re-Evaluation 09/20/15   PT Start Time 5732   PT Stop Time 1534   PT Time Calculation (min) 49 min   Activity Tolerance Patient limited by pain;Patient tolerated treatment well   Behavior During Therapy [facility] for tasks assessed/performed      Past Medical History  Diagnosis Date  . Allergy     rhinitis  . Depression   . Headache(784.0)   . Hyperlipidemia   . Osteoarthritis   . Facial paralysis     right face/ chronic pain syndrome (pain management Dr Sydell Axon)  . Trigeminal neuralgia     right  . Hypotension   . Blood transfusion   . Anemia   . Iron deficiency anemia 11/15/2014  . Iron malabsorption 11/15/2014    Past Surgical History  Procedure Laterality Date  . Osteotomy    . Mandible surgery    . Plastic facial surgery      There were no vitals filed for this visit.  Visit Diagnosis:  Shoulder impingement, left  Left shoulder pain  Cervical radiculitis  Cervical muscle pain  Posture abnormality      Subjective Assessment - 08/15/15 1448    Subjective Patient reports sick with stomach flu last week. States pain overall better but had a bad flare-up (9/10) last Friday requiring her to take Percocet and muscle relaxant. Woke up Saturday feeling great. Reports neck pain typically more of a problem than shoulder lately.   Currently in Pain? Yes   Pain Score 5    Pain Location Shoulder   Pain Orientation Left;Proximal;Lateral   Pain Descriptors / Indicators Aching   Pain Radiating Towards radicular pain to just proximal to  elbow   Pain Score 5   Pain Location Neck   Pain Orientation Left;Lateral   Pain Descriptors / Indicators --  "Straining"          Today's Treatment  Ther Ex- Seated chin tuck/cervical retraction 10x3" Scapular retraction/depression 10x3" Low row with red TB 2x10 with 5" hold  Supine chest stretch hooklying on 1/2 foam roll with arms horizontally abducted at 90 degrees x 2 min Hooklying on 1/2 foam roll - Bilateral Shoulder horiz abduction with yellow TB x 10                                              Bilateral Shoulder ER with yellow TB x 10                                              Bilateral Shoulder flexion pullover 1# x 10  Left shoulder protraction x5  Seated stretches - upper trap & levator 20"x3 bilaterally    Manual Tx- STM to upper trap, levator in Rt sidelying, pectoralis in supine Joint mobs - left shoulder - distraction, inferior glide, AP            PT Education - 08/15/15 1545    Education provided Yes   Education Details Updated HEP - foam roll chest/pectoralis stretch, flexion pullover, horiz abduction with yellow TB, supine ER with yellow TB (inlcuded pictures/instructions for upper trap and levator stretches from last visit)   Person(s) Educated Patient   Methods Explanation;Demonstration;Handout   Comprehension Verbalized understanding;Returned demonstration;Need further instruction          PT Short Term Goals - 08/15/15 1552    PT SHORT TERM GOAL #1   Title Independent with initial HEP (08/23/15)   Status On-going  Continues to require correction for proper technique           PT Long Term Goals - 08/15/15 1553    PT LONG TERM GOAL #1   Title Independent with advanced HEP (09/20/15)   Status On-going   PT LONG TERM GOAL #2   Title Cervical ROM to within 10 degrees of normal without increased pain (09/20/15)   Status On-going   PT LONG TERM GOAL #3   Title Left shoulder ROM to within  10 degrees of right shoulder without increased pain (09/20/15)   Status On-going   PT LONG TERM GOAL #4   Title Patient able to reach behind back without pain adequate to complete dressing without assistance (09/20/15)   Status On-going   PT LONG TERM GOAL #5   Title Perform hobbies such as window glass beading with avg pain </= 2/10 (09/20/15)   Status On-going               Plan - 08/15/15 1548    Clinical Impression Statement Improving postural alignment with decreased forward head/shoulders. Continues to require corrections for proper completion of prior HEP exercises, limiting progression of exercises. Difficulty achieving good stretch without increasing pain elsewhere, especially for anterior shoulder/chest.   PT Next Visit Plan Continued review of HEP with upgrade as appropriate, manual therapy, stretching, postural training, ROM/strengthening, modalities PRN, ROM/MMT check   PT Home Exercise Plan Progressive HEP with weekly updates due to financial constraints.   Consulted and Agree with Plan of Care Patient        Problem List Patient Active Problem List   Diagnosis Date Noted  . Insomnia 12/16/2014  . Iron deficiency anemia 11/15/2014  . Iron malabsorption 11/15/2014  . CKD (chronic kidney disease) stage 3, GFR 30-59 ml/min 08/26/2014  . Trigeminal neuralgia 07/22/2012  . Essential hypertension, benign 04/13/2011  . ANKLE PAIN, LEFT 03/06/2011  . FATIGUE 08/10/2010  . HYPERSOMNIA 08/09/2009  . Hyperlipidemia 07/05/2009  . Depression 07/05/2009  . ALLERGIC RHINITIS 07/05/2009  . OSTEOARTHRITIS 07/05/2009  . HEADACHE 07/05/2009    Percival Spanish, PT, MPT 08/15/2015, 3:55 PM  [facility] [address]  [place], [place], [address] Phone: [phone]   Fax:  [phone]

## 2015-08-16 ENCOUNTER — Other Ambulatory Visit: Payer: Self-pay | Admitting: Family Medicine

## 2015-08-16 DIAGNOSIS — E785 Hyperlipidemia, unspecified: Secondary | ICD-10-CM

## 2015-08-16 MED ORDER — ROSUVASTATIN CALCIUM 10 MG PO TABS: ORAL_TABLET | ORAL | Status: DC

## 2015-08-17 ENCOUNTER — Other Ambulatory Visit: Payer: Self-pay | Admitting: *Deleted

## 2015-08-17 DIAGNOSIS — R937 Abnormal findings on diagnostic imaging of other parts of musculoskeletal system: Secondary | ICD-10-CM

## 2015-08-17 DIAGNOSIS — D7589 Other specified diseases of blood and blood-forming organs: Principal | ICD-10-CM

## 2015-08-22 ENCOUNTER — Ambulatory Visit: Payer: Medicare Other | Admitting: Physical Therapy

## 2015-08-22 DIAGNOSIS — M5412 Radiculopathy, cervical region: Secondary | ICD-10-CM

## 2015-08-22 DIAGNOSIS — R293 Abnormal posture: Secondary | ICD-10-CM

## 2015-08-22 DIAGNOSIS — M7542 Impingement syndrome of left shoulder: Secondary | ICD-10-CM | POA: Diagnosis not present

## 2015-08-22 DIAGNOSIS — M542 Cervicalgia: Secondary | ICD-10-CM

## 2015-08-22 DIAGNOSIS — M25512 Pain in left shoulder: Secondary | ICD-10-CM

## 2015-08-22 NOTE — Therapy (Addendum)
[facility] [address]  [place], [place], [address] Phone: [phone]   Fax:  [phone]  Physical Therapy Treatment  Patient Details  Name: Desiree Wilson MRN: [id number] Date of Birth: [birth year] Referring Provider:  Susa Day, MD  Encounter Date: 08/22/2015      PT End of Session - 08/22/15 1625    Visit Number 4   Number of Visits 16   Date for PT Re-Evaluation 09/20/15   PT Start Time 6812   PT Stop Time 1531   PT Time Calculation (min) 48 min   Activity Tolerance Patient tolerated treatment well   Behavior During Therapy [facility] for tasks assessed/performed      Past Medical History  Diagnosis Date  . Allergy     rhinitis  . Depression   . Headache(784.0)   . Hyperlipidemia   . Osteoarthritis   . Facial paralysis     right face/ chronic pain syndrome (pain management Dr Sydell Axon)  . Trigeminal neuralgia     right  . Hypotension   . Blood transfusion   . Anemia   . Iron deficiency anemia 11/15/2014  . Iron malabsorption 11/15/2014    Past Surgical History  Procedure Laterality Date  . Osteotomy    . Mandible surgery    . Plastic facial surgery      There were no vitals filed for this visit.  Visit Diagnosis:  Shoulder impingement, left  Left shoulder pain  Cervical radiculitis  Cervical muscle pain  Posture abnormality      Subjective Assessment - 08/22/15 1448    Subjective Patient reports "today's been a really good day" except diffculty with washing hair and shaving armpits during shower. States yesterday she felt a big "pop" and was painfree for ~3 hrs. Reports shoulder pain recently </= 5/10 most of the time. Denies neck pain at present, but states it still hurts sometimes at night.   Currently in Pain? Yes   Pain Score 4    Pain Location Shoulder   Pain Orientation Left;Posterior;Lateral            [facility] PT Assessment - 08/22/15 1445    ROM / Strength   AROM / PROM / Strength AROM;Strength   AROM   AROM Assessment Site Shoulder;Cervical   Right/Left Shoulder Left   Left Shoulder Flexion 154 Degrees   Left Shoulder ABduction 140 Degrees   Left Shoulder Internal Rotation 60 Degrees   Left Shoulder External Rotation 58 Degrees   Cervical Flexion 48   Cervical Extension 64   Cervical - Right Side Bend 39  c/o "strain"   Cervical - Left Side Bend 55   Cervical - Right Rotation 65   Cervical - Left Rotation 62   Strength   Strength Assessment Site Shoulder   Right/Left Shoulder Left   Left Shoulder Flexion 4-/5   Left Shoulder ABduction 3+/5   Left Shoulder Internal Rotation 3+/5   Left Shoulder External Rotation 3+/5           Today's Treatment  Ther Ex- Low row with green TB 2x10 with 5" hold  Hooklying on 1/2 foam roll - Bilateral Shoulder horiz abduction with red TB x 10  Bilateral Shoulder ER with red TB x 10  Bilateral Shoulder flexion pullover 3# x 10  Sidelying open book stretch 5x5" Sidelying - ER with neutral shoulder 1#x10  Horiz Abd at 90 dg 1#x10                   Abduction 1#x10  Manual Tx- STM to upper trap, levator, pectoralis, teres major & minor in Rt sidelying Joint mobs - Left shoulder - distraction, inferior glide, AP in supine                      Lt Scapular mobs in Rt sidelying          PT Education - 08/22/15 1821    Education Details Updated HEP - Sidelying exercises (refer to Pt Instructions)   Person(s) Educated Patient   Methods Explanation;Demonstration;Handout   Comprehension Verbalized understanding;Returned demonstration;Need further instruction          PT Short Term Goals - 08/22/15 1815    PT SHORT TERM GOAL #1   Title Independent with initial HEP (08/23/15)   Status Achieved           PT Long Term Goals - 08/22/15 1815    PT LONG TERM GOAL #1   Title  Independent with advanced HEP (09/20/15)   Status On-going   PT LONG TERM GOAL #2   Title Cervical ROM to within 10 degrees of normal without increased pain (09/20/15)   Status On-going   PT LONG TERM GOAL #3   Title Left shoulder ROM to within 10 degrees of right shoulder without increased pain (09/20/15)   Status On-going   PT LONG TERM GOAL #4   Title Patient able to reach behind back without pain adequate to complete dressing without assistance (09/20/15)   Status On-going   PT LONG TERM GOAL #5   Title Perform hobbies such as window glass beading with avg pain </= 2/10 (09/20/15)   Status On-going               Plan - 08/22/15 1809    Clinical Impression Statement Continued improvement with postural alignment, although left shoudler remains slightly elevated relative to right. Cervical and shoulder ROM improved in nearly all planes with decreasing pain, although continues to experience some posterior/inferior shoulder pain. Manual therapy focused on STM, manual stretching and scapular mobilization to promote more mormal movemetnt patterns and decrease pain. Progressed exercises to include sidelying ROM/stretch and strengthening.   PT Next Visit Plan Continued review of HEP with upgrade as appropriate, manual therapy, stretching, postural training, ROM/strengthening, modalities PRN   Consulted and Agree with Plan of Care Patient        Problem List Patient Active Problem List   Diagnosis Date Noted  . Insomnia 12/16/2014  . Iron deficiency anemia 11/15/2014  . Iron malabsorption 11/15/2014  . CKD (chronic kidney disease) stage 3, GFR 30-59 ml/min 08/26/2014  . Trigeminal neuralgia 07/22/2012  . Essential hypertension, benign 04/13/2011  . ANKLE PAIN, LEFT 03/06/2011  . FATIGUE 08/10/2010  . HYPERSOMNIA 08/09/2009  . Hyperlipidemia 07/05/2009  . Depression 07/05/2009  . ALLERGIC RHINITIS 07/05/2009  . OSTEOARTHRITIS 07/05/2009  . HEADACHE 07/05/2009    Percival Spanish, PT, MPT 08/22/2015, 6:22 PM  [facility] [address]  [place], [place], [address] Phone: [phone]   Fax:  [phone]

## 2015-08-26 ENCOUNTER — Encounter: Payer: Self-pay | Admitting: Family Medicine

## 2015-08-26 ENCOUNTER — Other Ambulatory Visit: Payer: Self-pay | Admitting: Family Medicine

## 2015-08-26 ENCOUNTER — Ambulatory Visit (INDEPENDENT_AMBULATORY_CARE_PROVIDER_SITE_OTHER): Payer: Medicare Other | Admitting: Family Medicine

## 2015-08-26 VITALS — BP 137/82 | HR 89 | Temp 97.8°F | Wt 180.0 lb

## 2015-08-26 DIAGNOSIS — Z23 Encounter for immunization: Secondary | ICD-10-CM | POA: Diagnosis not present

## 2015-08-26 DIAGNOSIS — E785 Hyperlipidemia, unspecified: Secondary | ICD-10-CM

## 2015-08-26 DIAGNOSIS — L03313 Cellulitis of chest wall: Secondary | ICD-10-CM | POA: Diagnosis not present

## 2015-08-26 MED ORDER — CEPHALEXIN 500 MG PO CAPS: 500.0000 mg | ORAL_CAPSULE | Freq: Three times a day (TID) | ORAL | Status: DC

## 2015-08-26 NOTE — Progress Notes (Signed)
   Subjective:    Patient ID: Desiree Wilson, female    DOB: 1966-07-09, 49 y.o.   MRN: [id number]  HPI Patient comes in today with a sore area on her left breast. It started about 5 days ago. 2 days ago she noticed some's pus coming out of the wedding. She does feel like it's starting to heal but if she is now having throbbing and stabbing pain. She has had problems on this breast before. Last year she had an abscess that had to be lanced. Culture at that time was just plain staph aureus. I'll send her a prescription for Keflex.   Hyperlipemia-she's been off her statin for almost a month because she was not able to get the prescription refilled. Per our records the medication was refilled for 90 day supply about 10 days ago. Will call the pharmacy to verify what's going on.  Review of Systems     Objective:   Physical Exam  Constitutional: She is oriented to person, place, and time. She appears well-developed and well-nourished.  Neurological: She is alert and oriented to person, place, and time.  Skin: Skin is warm and dry. Rash noted.  On the left breast in the outer lower quadrant she has an approximately 1 cm area of erythema with some scaling skin just around the margin. No active drainage with compression. Just a small amount of induration. Do not palpate any distinct abscess underneath the tissue.  Psychiatric: She has a normal mood and affect. Her behavior is normal.          Assessment & Plan:  Cellulitis-left breast. It appears the abscess female drained on its own. She still has some surrounding erythema. We'll go ahead and place her on Keflex for staph. Call if not significantly better or completely resolved within one week.  Hyperlipidemia - will call [facility] to find out why they don't have her RX/  Will notify patient when we find out.

## 2015-08-31 ENCOUNTER — Ambulatory Visit: Payer: Medicare Other | Admitting: Physical Therapy

## 2015-08-31 DIAGNOSIS — R293 Abnormal posture: Secondary | ICD-10-CM

## 2015-08-31 DIAGNOSIS — M5412 Radiculopathy, cervical region: Secondary | ICD-10-CM

## 2015-08-31 DIAGNOSIS — M542 Cervicalgia: Secondary | ICD-10-CM

## 2015-08-31 DIAGNOSIS — M7542 Impingement syndrome of left shoulder: Secondary | ICD-10-CM

## 2015-08-31 DIAGNOSIS — M25512 Pain in left shoulder: Secondary | ICD-10-CM

## 2015-08-31 NOTE — Therapy (Signed)
[facility] [address]  [place], [place], [address] Phone: [phone]   Fax:  [phone]  Physical Therapy Treatment  Patient Details  Name: Desiree Wilson MRN: [id number] Date of Birth: [birth year] Referring Provider:  Susa Day, MD  Encounter Date: 08/31/2015      PT End of Session - 08/31/15 0857    Visit Number 5   Number of Visits 16   Date for PT Re-Evaluation 09/20/15   PT Start Time 0848   PT Stop Time 0931   PT Time Calculation (min) 43 min   Activity Tolerance Patient tolerated treatment well   Behavior During Therapy [facility] for tasks assessed/performed      Past Medical History  Diagnosis Date  . Allergy     rhinitis  . Depression   . Headache(784.0)   . Hyperlipidemia   . Osteoarthritis   . Facial paralysis     right face/ chronic pain syndrome (pain management Dr Sydell Axon)  . Trigeminal neuralgia     right  . Hypotension   . Blood transfusion   . Anemia   . Iron deficiency anemia 11/15/2014  . Iron malabsorption 11/15/2014    Past Surgical History  Procedure Laterality Date  . Osteotomy    . Mandible surgery    . Plastic facial surgery      There were no vitals filed for this visit.  Visit Diagnosis:  Shoulder impingement, left  Left shoulder pain  Cervical radiculitis  Cervical muscle pain  Posture abnormality      Subjective Assessment - 08/31/15 0852    Subjective Patient stating having less pain at night, not needing to take tramadol at night for past 2 days. Has also not needed to take muscle relaxants. Patient noting improved improving use of UE with household chores (cooking and laundry) but still notes some difficulty with bathing (washing/shaving under arm) and dressing (hooking bra and taking shirt over head). Overall patient very pleased with progress so far.   Currently in Pain? Yes   Pain Score 2            Today's Treatment  Ther Ex- Low  row with green TB 2x10 with 5" hold  Hooklying on 1/2 foam roll - Bilateral Shoulder horiz abduction with green TB 10x3"  Bilateral Shoulder ER with red TB 10x3" (attempted with green but only able to complete 2 reps)  Bilateral Shoulder flexion pullover 3# x10 Supine Lt shoulder circles at 90 dg flexion CW/CCW 2# x10 Supine Lt serratus punches at 90 dg flexion 2# x10  Sidelying open book stretch 5x5" Sidelying - ER with neutral shoulder 2# x10  Horiz Abd at 90 dg 2# x10  Abduction 1# x10           PT Short Term Goals - 08/31/15 1239    PT SHORT TERM GOAL #1   Title Independent with initial HEP (08/23/15)   Status Achieved           PT Long Term Goals - 08/31/15 1239    PT LONG TERM GOAL #1   Title Independent with advanced HEP (09/20/15)   Status On-going   PT LONG TERM GOAL #2   Title Cervical ROM to within 10 degrees of normal without increased pain (09/20/15)   Status On-going   PT LONG TERM GOAL #3   Title Left shoulder ROM to within 10 degrees of right shoulder without increased pain (09/20/15)   Status On-going  PT LONG TERM GOAL #4   Title Patient able to reach behind back without pain adequate to complete dressing without assistance (09/20/15)   Status On-going   PT LONG TERM GOAL #5   Title Perform hobbies such as window glass beading with avg pain </= 2/10 (09/20/15)   Status On-going               Plan - 08/31/15 1145    Clinical Impression Statement Patient continues to report functional improvement with daily tasks with decreasing pain influence. Tolerating progression of exercises without complaints, with patient provided green theraband for home use.   PT Next Visit Plan Continued review of HEP with upgrade as appropriate, manual therapy, stretching, postural training, ROM/strengthening, modalities PRN   Consulted and Agree  with Plan of Care Patient        Problem List Patient Active Problem List   Diagnosis Date Noted  . Insomnia 12/16/2014  . Iron deficiency anemia 11/15/2014  . Iron malabsorption 11/15/2014  . CKD (chronic kidney disease) stage 3, GFR 30-59 ml/min 08/26/2014  . Trigeminal neuralgia 07/22/2012  . Essential hypertension, benign 04/13/2011  . ANKLE PAIN, LEFT 03/06/2011  . FATIGUE 08/10/2010  . HYPERSOMNIA 08/09/2009  . Hyperlipidemia 07/05/2009  . Depression 07/05/2009  . ALLERGIC RHINITIS 07/05/2009  . OSTEOARTHRITIS 07/05/2009  . HEADACHE 07/05/2009    Percival Spanish, PT, MPT 08/31/2015, 12:41 PM  [facility] [address]  [place], [place], [address] Phone: [phone]   Fax:  [phone]

## 2015-09-07 ENCOUNTER — Ambulatory Visit: Payer: Medicare Other | Admitting: Physical Therapy

## 2015-09-19 ENCOUNTER — Ambulatory Visit (INDEPENDENT_AMBULATORY_CARE_PROVIDER_SITE_OTHER): Payer: Medicare Other | Admitting: Family Medicine

## 2015-09-19 ENCOUNTER — Encounter: Payer: Self-pay | Admitting: Family Medicine

## 2015-09-19 VITALS — BP 123/78 | HR 98 | Wt 179.0 lb

## 2015-09-19 DIAGNOSIS — I10 Essential (primary) hypertension: Secondary | ICD-10-CM | POA: Diagnosis not present

## 2015-09-19 DIAGNOSIS — F329 Major depressive disorder, single episode, unspecified: Secondary | ICD-10-CM

## 2015-09-19 DIAGNOSIS — G500 Trigeminal neuralgia: Secondary | ICD-10-CM

## 2015-09-19 DIAGNOSIS — D239 Other benign neoplasm of skin, unspecified: Secondary | ICD-10-CM | POA: Diagnosis not present

## 2015-09-19 DIAGNOSIS — F32A Depression, unspecified: Secondary | ICD-10-CM

## 2015-09-19 DIAGNOSIS — C4491 Basal cell carcinoma of skin, unspecified: Secondary | ICD-10-CM

## 2015-09-19 DIAGNOSIS — D229 Melanocytic nevi, unspecified: Secondary | ICD-10-CM

## 2015-09-19 NOTE — Patient Instructions (Signed)
Keep wound covered until tomorrow. Okay to wash with soap and water in the shower. Do not scrub the area. Avoid alcohol or peroxide products. Apply Vaseline once or twice daily until completely healed. The pathology reports to be back in about a week and we will give you a call around that time. If you have not heard from [place] in 2 weeks and please give [place] a call.

## 2015-09-19 NOTE — Progress Notes (Signed)
   Subjective:    Patient ID: Desiree Wilson, female    DOB: 30-Jul-1966, 49 y.o.   MRN: [id number]  HPI Hypertension- Pt denies chest pain, SOB, dizziness, or heart palpitations.  Taking meds as directed w/o problems.  Denies medication side effects.    F/U depression - she is on abilify and zoloft. Very happy with current regimen. She feels that her symptoms are well controlled. PHQ 9 score of 3 today.  Mole on left shoulder x 3 months.  She says it peels off and then comes back and looks pink and then starts to look more brown and then peels again. She denies any itching or irritation per se.  chornic pain. She is off the butrans and now on belbuca. Has been on it for 2 weeks and she is feeling better on this regimen.   Review of Systems     Objective:   Physical Exam  Constitutional: She is oriented to person, place, and time. She appears well-developed and well-nourished.  HENT:  Head: Normocephalic and atraumatic.  Cardiovascular: Normal rate, regular rhythm and normal heart sounds.   Pulmonary/Chest: Effort normal and breath sounds normal.  Neurological: She is alert and oriented to person, place, and time.  Skin: Skin is warm and dry.   1 cm pink/brown papule with some dry scale on the surface. Border is mildly erythematous.   Psychiatric: She has a normal mood and affect. Her behavior is normal.          Assessment & Plan:  HTN - well controlled. Continue current regimen. Follow up in 6 months.  Depression-continue Abilify and Zoloft. Happy with current regimen and doing well. PHQ 9 score of 3 today. Next  Inflamed seborrheic keratosis versus atypical nevus- discussed shave biopsy for further evaluation. It's not really an area where it should be peeling off repetitively so we'll go ahead and do a biopsy. Patient tolerated procedure well and follow-up when care was discussed. Next  Chronic pain/trigeminal neuralgia. Now on the Belbuca, dissolvable pain medication.  And doing much better on this regimen.  Shave Biopsy Procedure Note  Pre-operative Diagnosis: inflammed seb keratosis  Post-operative Diagnosis: same  Locations:left upper shoulder   Indications: irritation  Anesthesia: Lidocaine 1% with epinephrine without added sodium bicarbonate  Procedure Details  History of allergy to iodine: no  Patient informed of the risks (including bleeding and infection) and benefits of the  procedure and Verbal informed consent obtained.  The lesion and surrounding area were given a sterile prep using chlorhexidine and draped in the usual sterile fashion. A scalpel was used to shave an area of skin approximately 1cm by 1cm.  Hemostasis achieved with alumuninum chloride. Antibiotic ointment and a sterile dressing applied.  The specimen was sent for pathologic examination. The patient tolerated the procedure well.  EBL: 0 ml  Findings: Await pathology   Condition: Stable  Complications: none.  Plan: 1. Instructed to keep the wound dry and covered for 24-48h and clean thereafter. 2. Warning signs of infection were reviewed.   3. Recommended that the patient use OTC acetaminophen as needed for pain.  4. Return PRN.

## 2015-09-20 ENCOUNTER — Ambulatory Visit: Payer: Medicare Other | Admitting: Physical Therapy

## 2015-09-20 LAB — LIPID PANEL
Cholesterol: 217 mg/dL — ABNORMAL HIGH (ref 125–200)
HDL: 84 mg/dL (ref >=46)
LDL CALC: 96 mg/dL (ref <130)
Total CHOL/HDL Ratio: 2.6 Ratio (ref <=5.0)
Triglycerides: 184 mg/dL — ABNORMAL HIGH (ref <150)
VLDL: 37 mg/dL — ABNORMAL HIGH (ref <30)

## 2015-09-21 LAB — IMMUNOFIXATION ELECTROPHORESIS
IGA: 178 mg/dL (ref 69–380)
IgG (Immunoglobin G), Serum: 935 mg/dL (ref 690–1700)
IgM, Serum: 89 mg/dL (ref 52–322)
TOTAL PROTEIN, SERUM ELECTROPHOR: 7 g/dL (ref 6.0–8.3)

## 2015-09-27 ENCOUNTER — Ambulatory Visit: Payer: Medicare Other | Attending: Specialist | Admitting: Physical Therapy

## 2015-09-27 DIAGNOSIS — M542 Cervicalgia: Secondary | ICD-10-CM

## 2015-09-27 DIAGNOSIS — M25512 Pain in left shoulder: Secondary | ICD-10-CM | POA: Diagnosis not present

## 2015-09-27 DIAGNOSIS — R293 Abnormal posture: Secondary | ICD-10-CM | POA: Diagnosis present

## 2015-09-27 NOTE — Therapy (Addendum)
[facility] [address]  [place] [address], [place], [address] Phone: [phone]   Fax:  [phone]  Physical Therapy Treatment  Patient Details  Name: Desiree Wilson MRN: [id number] Date of Birth: 08-31-1966 Referring Jolena Kittle:  Susa Day, MD  Encounter Date: 09/27/2015      PT End of Session - 09/27/15 0932    Visit Number 6   Number of Visits 16   Date for PT Re-Evaluation --   PT Start Time 0930   PT Stop Time 1016   PT Time Calculation (min) 46 min   Activity Tolerance Patient tolerated treatment well   Behavior During Therapy [facility] for tasks assessed/performed      Past Medical History  Diagnosis Date  . Allergy     rhinitis  . Depression   . Headache(784.0)   . Hyperlipidemia   . Osteoarthritis   . Facial paralysis     right face/ chronic pain syndrome (pain management Dr Sydell Axon)  . Trigeminal neuralgia     right  . Hypotension   . Blood transfusion   . Anemia   . Iron deficiency anemia 11/15/2014  . Iron malabsorption 11/15/2014    Past Surgical History  Procedure Laterality Date  . Osteotomy    . Mandible surgery    . Plastic facial surgery      There were no vitals filed for this visit.  Visit Diagnosis:  Left shoulder pain  Cervical muscle pain  Posture abnormality      Subjective Assessment - 09/27/15 0933    Subjective Patient has gone almost a month without PT due to multiple issues including a fall in garage with resulting trauma and more recently having a mole removed from her posterior shoulder after which MD restricted movement for a brief period. Patient reporting increased comfort with sleeping in bed and has been able to resume window beading although does note some increased pain up to 5/10 if spends a long time working on beading.   Currently in Pain? Yes   Pain Score --  1.5-2/10 in neck, 2.5-3/10 in shoulder   Pain Orientation Left            [facility] PT  Assessment - 09/27/15 0930    Observation/Other Assessments   Focus on Therapeutic Outcomes (FOTO)  74% (26% limitation)   ROM / Strength   AROM / PROM / Strength AROM;Strength   AROM   AROM Assessment Site Shoulder;Cervical   Right/Left Shoulder Left   Left Shoulder Flexion 158 Degrees   Left Shoulder ABduction 162 Degrees   Left Shoulder Internal Rotation 69 Degrees   Left Shoulder External Rotation 74 Degrees   Cervical Flexion 56   Cervical Extension 69   Cervical - Right Side Bend 40   Cervical - Left Side Bend 46   Cervical - Right Rotation 75   Cervical - Left Rotation 69   Strength   Strength Assessment Site Shoulder   Right/Left Shoulder Left;Right   Right Shoulder Flexion 4+/5   Right Shoulder ABduction 4+/5   Right Shoulder Internal Rotation 4/5   Right Shoulder External Rotation 4/5   Left Shoulder Flexion 4/5   Left Shoulder ABduction 4/5   Left Shoulder Internal Rotation 4-/5   Left Shoulder External Rotation 4-/5           Today's Treatment  Ther Ex- Review of previous HEP's with emphasis on relevant exercises to continue at home + final HEP update with exercises  performed today: IR towel stretch 5x10" Standing Lt shoulder IR with yellow TB 10x3" Standing Lt shoulder ER with yellow TB 10x3" Standing Lt UE D2 diagonal PNF with yellow TB 10x3"            PT Short Term Goals - 08/31/15 1239    PT SHORT TERM GOAL #1   Title Independent with initial HEP (08/23/15)   Status Achieved           PT Long Term Goals - October 26, 2015 4401    PT LONG TERM GOAL #1   Title Independent with advanced HEP (09/20/15)   Status Achieved   PT LONG TERM GOAL #2   Title Cervical ROM to within 10 degrees of normal without increased pain (09/20/15)   Status Achieved   PT LONG TERM GOAL #3   Title Left shoulder ROM to within 10 degrees of right shoulder without increased pain (09/20/15)   Status Achieved   PT LONG TERM GOAL #4   Title Patient able to reach behind  back without pain adequate to complete dressing without assistance (09/20/15)   Status Partially Met  Much improved from eval but Lt UE still slightly limited compared to Rt   PT LONG TERM GOAL #5   Title Perform hobbies such as window glass beading with avg pain </= 2/10 (09/20/15)   Status Partially Met  Able to resume window beading but pain can reach up to 5-6/10 if she works at it too [place] - 10-26-15 1025    Clinical Impression Statement Patient with extended absence from PT following a fall and then surgical removal of a mole from her posterior shoulder, but patient reporting good diligence with HEP during this period. Patient demonstrating significant improvement with cervical and shoulder ROM now essentially WNL and left UE strength now 4-/5 to 4/5. Patient reporting decreased pain interference with sleep and has been ablbe to resume hobbies including window beading. Patient is very pleased with her progress and given postive results with HEP, patient feels like she will be able to continue on her own with HEP at this point. HEP updated to address remaining deficits and final review of all previous exercises completed with patient able to demonstrate all exercises appropriately. Majority of goals met and patient in agreement with plan for discharge at this time.   PT Next Visit Plan D/C   Consulted and Agree with Plan of Care Patient          G-Codes - 10/26/15 1040    Functional Assessment Tool Used FOTO = 74% (26% limitation)   Functional Limitation Carrying, moving and handling objects   Carrying, Moving and Handling Objects Current Status (U2725) At least 20 percent but less than 40 percent impaired, limited or restricted   Carrying, Moving and Handling Objects Goal Status (D6644) At least 20 percent but less than 40 percent impaired, limited or restricted   Carrying, Moving and Handling Objects Discharge Status [phone]) At least 20 percent but less than 40  percent impaired, limited or restricted      Problem List Patient Active Problem List   Diagnosis Date Noted  . Insomnia 12/16/2014  . Iron deficiency anemia 11/15/2014  . Iron malabsorption 11/15/2014  . CKD (chronic kidney disease) stage 3, GFR 30-59 ml/min 08/26/2014  . Trigeminal neuralgia 07/22/2012  . Essential hypertension, benign 04/13/2011  . ANKLE PAIN, LEFT 03/06/2011  . FATIGUE 08/10/2010  . HYPERSOMNIA  08/09/2009  . Hyperlipidemia 07/05/2009  . Depression 07/05/2009  . ALLERGIC RHINITIS 07/05/2009  . OSTEOARTHRITIS 07/05/2009  . HEADACHE 07/05/2009    Percival Spanish, PT, MPT 09/27/2015, 10:41 AM  [facility] [address]  [place], [place], [address] Phone: [phone]   Fax:  [phone]    PHYSICAL THERAPY DISCHARGE SUMMARY  Visits from Start of Care: 6  Current functional level related to goals / functional outcomes:  Patient demonstrating significant improvement with cervical and shoulder ROM now essentially WNL and left UE strength now 4-/5 to 4/5. Patient reporting decreased pain interference with sleep and has been ablbe to resume hobbies including window beading. Patient is very pleased with her progress and given postive results with HEP, patient feels like she will be able to continue on her own with HEP at this point. Majority of goals met and patient in agreement with plan for discharge at this time.   Remaining deficits:  Intermittent neck and shoulder pain with prolonged time on same activity (i.e. Window beading)   Education / Equipment:  HEP Plan: Patient agrees to discharge.  Patient goals were partially met. Patient is being discharged due to being pleased with the current functional level.  ?????       Percival Spanish, PT, MPT 09/27/2015, 10:41 AM  [facility] [address]  [place], [place],  [address] Phone: [phone]   Fax:  [phone]

## 2015-09-30 NOTE — Addendum Note (Signed)
Addended by: [name] D on: 09/30/2015 09:04 PM   Modules accepted: Orders

## 2015-10-28 ENCOUNTER — Other Ambulatory Visit: Payer: Self-pay | Admitting: Family Medicine

## 2015-11-28 ENCOUNTER — Other Ambulatory Visit: Payer: Self-pay | Admitting: Family Medicine

## 2015-12-22 ENCOUNTER — Other Ambulatory Visit: Payer: Self-pay | Admitting: Family Medicine

## 2016-01-03 ENCOUNTER — Encounter: Payer: Self-pay | Admitting: Family Medicine

## 2016-01-03 ENCOUNTER — Ambulatory Visit (INDEPENDENT_AMBULATORY_CARE_PROVIDER_SITE_OTHER): Payer: Medicare Other | Admitting: Family Medicine

## 2016-01-03 VITALS — BP 104/82 | HR 86 | Temp 97.7°F | Wt 190.0 lb

## 2016-01-03 DIAGNOSIS — R10A0 Flank pain, unspecified side: Secondary | ICD-10-CM

## 2016-01-03 DIAGNOSIS — N898 Other specified noninflammatory disorders of vagina: Secondary | ICD-10-CM | POA: Diagnosis not present

## 2016-01-03 DIAGNOSIS — R109 Unspecified abdominal pain: Secondary | ICD-10-CM

## 2016-01-03 DIAGNOSIS — N3000 Acute cystitis without hematuria: Secondary | ICD-10-CM | POA: Diagnosis not present

## 2016-01-03 LAB — WET PREP FOR TRICH, YEAST, CLUE
Clue Cells Wet Prep HPF POC: NONE SEEN
Trich, Wet Prep: NONE SEEN
YEAST WET PREP: NONE SEEN

## 2016-01-03 LAB — POCT URINALYSIS DIPSTICK
Bilirubin, UA: NEGATIVE
GLUCOSE UA: 100
Nitrite, UA: POSITIVE
Spec Grav, UA: 1.015
UROBILINOGEN UA: 1
pH, UA: 6

## 2016-01-03 MED ORDER — CIPROFLOXACIN HCL 500 MG PO TABS: 500.0000 mg | ORAL_TABLET | Freq: Two times a day (BID) | ORAL | Status: AC

## 2016-01-03 MED ORDER — KETOROLAC TROMETHAMINE 60 MG/2ML IM SOLN
60.0000 mg | Freq: Once | INTRAMUSCULAR | Status: AC
  Administered 2016-01-03: 60 mg via INTRAMUSCULAR

## 2016-01-03 NOTE — Patient Instructions (Signed)

## 2016-01-03 NOTE — Addendum Note (Signed)
Addended by: Huel Cote on: 01/03/2016 04:39 PM   Modules accepted: Orders

## 2016-01-03 NOTE — Progress Notes (Signed)
   Subjective:    Patient ID: Desiree Wilson, female    DOB: 1966-07-19, 50 y.o.   MRN: [id number]  HPI patient comes in with 3 days of urinary urgency and pressure. No hematuria. No fevers chills or sweats. She has started to have more intense bilateral low back pain. She also has some vaginal itching and thinks she could have a yeast infection. She actually was treated for a UTI about a month ago when she went to a local urgent care. She was placed on Macrobid for about 7 days. She did get better but wonders if this is a recurrence of the original infection.  Taking AZO.  She has been driking cranberry juice.   Review of Systems     Objective:   Physical Exam  Constitutional: She is oriented to person, place, and time. She appears well-developed and well-nourished.  HENT:  Head: Normocephalic and atraumatic.  Eyes: Conjunctivae and EOM are normal.  Cardiovascular: Normal rate.   Pulmonary/Chest: Effort normal.  Abdominal: There is tenderness.  Mild suprapubic tenderness.  Musculoskeletal:  Bilateral CVA tenderness.  Neurological: She is alert and oriented to person, place, and time.  Skin: Skin is dry. No pallor.  Psychiatric: She has a normal mood and affect. Her behavior is normal.  Vitals reviewed.         Assessment & Plan:  UTI - urinalysis is consistent UTI. We'll send for culture since she is concerned that she may still have a UTI from when she was treated a month ago. Increase fluids. Make sure get some rest can use Tylenol or ibuprofen for pain relief. We did offer Toradol injection when she was here she looks quite uncomfortable. She is afebrile and less concerned about pyelonephritis but certainly if she develops a fever she is to call the office immediately.  Vaginitis - wet prep collected. We will order this time and should have the results back by the end of today.

## 2016-01-07 LAB — URINE CULTURE: Colony Count: >=100000

## 2016-01-19 ENCOUNTER — Encounter: Payer: Self-pay | Admitting: Family Medicine

## 2016-01-19 ENCOUNTER — Ambulatory Visit (INDEPENDENT_AMBULATORY_CARE_PROVIDER_SITE_OTHER): Payer: Medicare Other | Admitting: Family Medicine

## 2016-01-19 VITALS — BP 130/78 | HR 86 | Ht 65.0 in | Wt 191.0 lb

## 2016-01-19 DIAGNOSIS — F32A Depression, unspecified: Secondary | ICD-10-CM

## 2016-01-19 DIAGNOSIS — G4700 Insomnia, unspecified: Secondary | ICD-10-CM | POA: Diagnosis not present

## 2016-01-19 DIAGNOSIS — Z6831 Body mass index (BMI) 31.0-31.9, adult: Secondary | ICD-10-CM

## 2016-01-19 DIAGNOSIS — F329 Major depressive disorder, single episode, unspecified: Secondary | ICD-10-CM

## 2016-01-19 DIAGNOSIS — E669 Obesity, unspecified: Secondary | ICD-10-CM

## 2016-01-19 DIAGNOSIS — I10 Essential (primary) hypertension: Secondary | ICD-10-CM

## 2016-01-19 MED ORDER — SERTRALINE HCL 100 MG PO TABS: 150.0000 mg | ORAL_TABLET | Freq: Every day | ORAL | Status: DC

## 2016-01-19 NOTE — Progress Notes (Signed)
   Subjective:    Patient ID: Desiree Wilson, female    DOB: 02-13-1966, 50 y.o.   MRN: [id number]  HPI Hypertension- Pt denies chest pain, SOB, dizziness, or heart palpitations.  Taking meds as directed w/o problems.  Denies medication side effects.    4 mo f/u for depression - doing well.  She does complain of continued sleep difficulty and low energy but otherwise feels like her depression is really well controlled. She still has some trouble relaxing at times. Her PHQ 9 score was 4 today, 3 of the points were for sleep. And her dad 7 score was 4 today, 3 of the points were for sleep.  Insomnia - goes to bed around 9 and then wakes up at 12:30 and can't go back to sleep.  She takes about a 3 hour nap. She did start working out at [employer] on Monday. No caffeine on regular basis.  Her dog does sleep with her.  She's also gained about 11 pounds and plans to get back on track with eating more healthy and fresh vegetables and getting to the gym. She says she would really like to lose 40 pounds and ultimately that is her goal.  Review of Systems     Objective:   Physical Exam  Constitutional: She is oriented to person, place, and time. She appears well-developed and well-nourished.  HENT:  Head: Normocephalic and atraumatic.  Cardiovascular: Normal rate, regular rhythm and normal heart sounds.   Pulmonary/Chest: Effort normal and breath sounds normal.  Neurological: She is alert and oriented to person, place, and time.  Skin: Skin is warm and dry.  Psychiatric: She has a normal mood and affect. Her behavior is normal.          Assessment & Plan:  HTN - well controlled per continue current regimen.  Depression - well controlled. Continue current regimen of Abilify and Zoloft. She is happy and doing great with it. Follow-up in 4 months. She will need to have an A1c done at that office visit.  Insomnia-we discussed sleep hygiene and strategies to improve her sleep quality including  avoiding having her pets sleep in the bed with her. Avoiding caffeine. Encouraged her not to take naps. She can certainly reduce the time that she sleeping during the day and that should improve the sleep time at night. The regular exercise that she has started this week should help as well. And she can still use the trazodone if needed but may need to move it several hours forward such as right after dinner time since it can be sedating during the daytime for her. We can also consider changing it at the next office visit if needed.  Obesity/BMI 31-continue work on diet and exercise. I do think she is quite motivated at this point in time. I think 40 pounds would be a great weight loss goal for her.

## 2016-02-08 DIAGNOSIS — G894 Chronic pain syndrome: Secondary | ICD-10-CM | POA: Diagnosis not present

## 2016-02-08 DIAGNOSIS — G501 Atypical facial pain: Secondary | ICD-10-CM | POA: Diagnosis not present

## 2016-02-24 ENCOUNTER — Emergency Department (HOSPITAL_BASED_OUTPATIENT_CLINIC_OR_DEPARTMENT_OTHER)
Admission: EM | Admit: 2016-02-24 | Discharge: 2016-02-24 | Disposition: A | Discharge status: Home / Self Care | Payer: Medicare Other | Attending: Emergency Medicine | Admitting: Emergency Medicine

## 2016-02-24 ENCOUNTER — Encounter (HOSPITAL_BASED_OUTPATIENT_CLINIC_OR_DEPARTMENT_OTHER): Payer: Self-pay | Admitting: *Deleted

## 2016-02-24 DIAGNOSIS — S199XXA Unspecified injury of neck, initial encounter: Secondary | ICD-10-CM | POA: Diagnosis present

## 2016-02-24 DIAGNOSIS — M1990 Unspecified osteoarthritis, unspecified site: Secondary | ICD-10-CM | POA: Insufficient documentation

## 2016-02-24 DIAGNOSIS — Y9389 Activity, other specified: Secondary | ICD-10-CM | POA: Insufficient documentation

## 2016-02-24 DIAGNOSIS — Z79899 Other long term (current) drug therapy: Secondary | ICD-10-CM | POA: Diagnosis not present

## 2016-02-24 DIAGNOSIS — Z862 Personal history of diseases of the blood and blood-forming organs and certain disorders involving the immune mechanism: Secondary | ICD-10-CM | POA: Diagnosis not present

## 2016-02-24 DIAGNOSIS — F329 Major depressive disorder, single episode, unspecified: Secondary | ICD-10-CM | POA: Insufficient documentation

## 2016-02-24 DIAGNOSIS — S39012A Strain of muscle, fascia and tendon of lower back, initial encounter: Secondary | ICD-10-CM | POA: Diagnosis not present

## 2016-02-24 DIAGNOSIS — Z87891 Personal history of nicotine dependence: Secondary | ICD-10-CM | POA: Insufficient documentation

## 2016-02-24 DIAGNOSIS — Y92410 Unspecified street and highway as the place of occurrence of the external cause: Secondary | ICD-10-CM | POA: Diagnosis not present

## 2016-02-24 DIAGNOSIS — Y998 Other external cause status: Secondary | ICD-10-CM | POA: Diagnosis not present

## 2016-02-24 DIAGNOSIS — G510 Bell's palsy: Secondary | ICD-10-CM | POA: Diagnosis not present

## 2016-02-24 DIAGNOSIS — E785 Hyperlipidemia, unspecified: Secondary | ICD-10-CM | POA: Insufficient documentation

## 2016-02-24 DIAGNOSIS — S161XXA Strain of muscle, fascia and tendon at neck level, initial encounter: Secondary | ICD-10-CM | POA: Insufficient documentation

## 2016-02-24 DIAGNOSIS — Z791 Long term (current) use of non-steroidal anti-inflammatories (NSAID): Secondary | ICD-10-CM | POA: Diagnosis not present

## 2016-02-24 HISTORY — DX: Encounter for other specified aftercare: Z51.89

## 2016-02-24 MED ORDER — MORPHINE SULFATE (PF) 4 MG/ML IV SOLN
4.0000 mg | Freq: Once | INTRAVENOUS | Status: AC
  Administered 2016-02-24: 4 mg via INTRAMUSCULAR
  Filled 2016-02-24: qty 1

## 2016-02-24 MED ORDER — HYDROCODONE-ACETAMINOPHEN 5-325 MG PO TABS: 1.0000 | ORAL_TABLET | Freq: Four times a day (QID) | ORAL | Status: DC | PRN

## 2016-02-24 MED ORDER — IBUPROFEN 800 MG PO TABS: 800.0000 mg | ORAL_TABLET | Freq: Three times a day (TID) | ORAL | Status: DC

## 2016-02-24 MED FILL — IBUPROFEN 800 MG TABLET: 800 | 7 days supply | Qty: 21 | Fill #0

## 2016-02-24 MED FILL — HYDROCODON-APAP 5-325: 5-325 | 2 days supply | Qty: 10 | Fill #0
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
## 2016-02-24 NOTE — Discharge Instructions (Signed)
Take motrin 800 mg every 6 hrs for pain  Take vicodin for severe pain. Do NOT drive with it.   Take your xanaflex as prescribed.   See your doctor.   You will be stiff and sore for several days.   Return to ER if you have severe headaches, vomiting, fevers, abdominal pain, chest pain, weakness, numbness, severe pain.

## 2016-02-28 ENCOUNTER — Other Ambulatory Visit: Payer: Self-pay | Admitting: Family Medicine

## 2016-02-28 DIAGNOSIS — Z139 Encounter for screening, unspecified: Secondary | ICD-10-CM

## 2016-02-29 ENCOUNTER — Ambulatory Visit (INDEPENDENT_AMBULATORY_CARE_PROVIDER_SITE_OTHER): Payer: Medicare Other | Admitting: Family Medicine

## 2016-02-29 ENCOUNTER — Encounter: Payer: Self-pay | Admitting: Family Medicine

## 2016-02-29 VITALS — BP 135/69 | HR 76 | Wt 193.0 lb

## 2016-02-29 DIAGNOSIS — M791 Myalgia: Secondary | ICD-10-CM

## 2016-02-29 DIAGNOSIS — M7918 Myalgia, other site: Secondary | ICD-10-CM | POA: Insufficient documentation
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
## 2016-02-29 NOTE — Patient Instructions (Signed)
Thank you for coming in today. Return in 2-4 weeks.  Attend physical therapy.    TENS UNIT: This is helpful for muscle pain and spasm.   Search and Purchase a TENS 7000 2nd edition at www.tenspros.com. It should be less than $30.     TENS unit instructions: Do not shower or bathe with the unit on Turn the unit off before removing electrodes or batteries If the electrodes lose stickiness add a drop of water to the electrodes after they are disconnected from the unit and place on plastic sheet. If you continued to have difficulty, call the TENS unit company to purchase more electrodes. Do not apply lotion on the skin area prior to use. Make sure the skin is clean and dry as this will help prolong the life of the electrodes. After use, always check skin for unusual red areas, rash or other skin difficulties. If there are any skin problems, does not apply electrodes to the same area. Never remove the electrodes from the unit by pulling the wires. Do not use the TENS unit or electrodes other than as directed. Do not change electrode placement without consultating your therapist or physician. Keep 2 fingers with between each electrode. Wear time ratio is 2:1, on to off times.    For example on for 30 minutes off for 15 minutes and then on for 30 minutes off for 15 minutes   Cervical Strain and Sprain With Rehab Cervical strain and sprain are injuries that commonly occur with "whiplash" injuries. Whiplash occurs when the neck is forcefully whipped backward or forward, such as during a motor vehicle accident or during contact sports. The muscles, ligaments, tendons, discs, and nerves of the neck are susceptible to injury when this occurs. RISK FACTORS Risk of having a whiplash injury increases if:  Osteoarthritis of the spine.  Situations that make head or neck accidents or trauma more likely.  High-risk sports (football, rugby, wrestling, hockey, auto racing, gymnastics, diving, contact  karate, or boxing).  Poor strength and flexibility of the neck.  Previous neck injury.  Poor tackling technique.  Improperly fitted or padded equipment. SYMPTOMS   Pain or stiffness in the front or back of neck or both.  Symptoms may present immediately or up to 24 hours after injury.  Dizziness, headache, nausea, and vomiting.  Muscle spasm with soreness and stiffness in the neck.  Tenderness and swelling at the injury site. PREVENTION  Learn and use proper technique (avoid tackling with the head, spearing, and head-butting; use proper falling techniques to avoid landing on the head).  Warm up and stretch properly before activity.  Maintain physical fitness:  Strength, flexibility, and endurance.  Cardiovascular fitness.  Wear properly fitted and padded protective equipment, such as padded soft collars, for participation in contact sports. PROGNOSIS  Recovery from cervical strain and sprain injuries is dependent on the extent of the injury. These injuries are usually curable in 1 week to 3 months with appropriate treatment.  RELATED COMPLICATIONS   Temporary numbness and weakness may occur if the nerve roots are damaged, and this may persist until the nerve has completely healed.  Chronic pain due to frequent recurrence of symptoms.  Prolonged healing, especially if activity is resumed too soon (before complete recovery). TREATMENT  Treatment initially involves the use of ice and medication to help reduce pain and inflammation. It is also important to perform strengthening and stretching exercises and modify activities that worsen symptoms so the injury does not get worse. These exercises  may be performed at home or with a therapist. For patients who experience severe symptoms, a soft, padded collar may be recommended to be worn around the neck.  Improving your posture may help reduce symptoms. Posture improvement includes pulling your chin and abdomen in while sitting or  standing. If you are sitting, sit in a firm chair with your buttocks against the back of the chair. While sleeping, try replacing your pillow with a small towel rolled to 2 inches in diameter, or use a cervical pillow or soft cervical collar. Poor sleeping positions delay healing.  For patients with nerve root damage, which causes numbness or weakness, the use of a cervical traction apparatus may be recommended. Surgery is rarely necessary for these injuries. However, cervical strain and sprains that are present at birth (congenital) may require surgery. MEDICATION   If pain medication is necessary, nonsteroidal anti-inflammatory medications, such as aspirin and ibuprofen, or other minor pain relievers, such as acetaminophen, are often recommended.  Do not take pain medication for 7 days before surgery.  Prescription pain relievers may be given if deemed necessary by your caregiver. Use only as directed and only as much as you need. HEAT AND COLD:   Cold treatment (icing) relieves pain and reduces inflammation. Cold treatment should be applied for 10 to 15 minutes every 2 to 3 hours for inflammation and pain and immediately after any activity that aggravates your symptoms. Use ice packs or an ice massage.  Heat treatment may be used prior to performing the stretching and strengthening activities prescribed by your caregiver, physical therapist, or athletic trainer. Use a heat pack or a warm soak. SEEK MEDICAL CARE IF:   Symptoms get worse or do not improve in 2 weeks despite treatment.  New, unexplained symptoms develop (drugs used in treatment may produce side effects). EXERCISES RANGE OF MOTION (ROM) AND STRETCHING EXERCISES - Cervical Strain and Sprain These exercises may help you when beginning to rehabilitate your injury. In order to successfully resolve your symptoms, you must improve your posture. These exercises are designed to help reduce the forward-head and rounded-shoulder posture  which contributes to this condition. Your symptoms may resolve with or without further involvement from your physician, physical therapist or athletic trainer. While completing these exercises, remember:   Restoring tissue flexibility helps normal motion to return to the joints. This allows healthier, less painful movement and activity.  An effective stretch should be held for at least 20 seconds, although you may need to begin with shorter hold times for comfort.  A stretch should never be painful. You should only feel a gentle lengthening or release in the stretched tissue. STRETCH- Axial Extensors  Lie on your back on the floor. You may bend your knees for comfort. Place a rolled-up hand towel or dish towel, about 2 inches in diameter, under the part of your head that makes contact with the floor.  Gently tuck your chin, as if trying to make a "double chin," until you feel a gentle stretch at the base of your head.  Hold __________ seconds. Repeat __________ times. Complete this exercise __________ times per day.  STRETCH - Axial Extension   Stand or sit on a firm surface. Assume a good posture: chest up, shoulders drawn back, abdominal muscles slightly tense, knees unlocked (if standing) and feet hip width apart.  Slowly retract your chin so your head slides back and your chin slightly lowers. Continue to look straight ahead.  You should feel a gentle stretch in  the back of your head. Be certain not to feel an aggressive stretch since this can cause headaches later.  Hold for __________ seconds. Repeat __________ times. Complete this exercise __________ times per day. STRETCH - Cervical Side Bend   Stand or sit on a firm surface. Assume a good posture: chest up, shoulders drawn back, abdominal muscles slightly tense, knees unlocked (if standing) and feet hip width apart.  Without letting your nose or shoulders move, slowly tip your right / left ear to your shoulder until your feel a  gentle stretch in the muscles on the opposite side of your neck.  Hold __________ seconds. Repeat __________ times. Complete this exercise __________ times per day. STRETCH - Cervical Rotators   Stand or sit on a firm surface. Assume a good posture: chest up, shoulders drawn back, abdominal muscles slightly tense, knees unlocked (if standing) and feet hip width apart.  Keeping your eyes level with the ground, slowly turn your head until you feel a gentle stretch along the back and opposite side of your neck.  Hold __________ seconds. Repeat __________ times. Complete this exercise __________ times per day. RANGE OF MOTION - Neck Circles   Stand or sit on a firm surface. Assume a good posture: chest up, shoulders drawn back, abdominal muscles slightly tense, knees unlocked (if standing) and feet hip width apart.  Gently roll your head down and around from the back of one shoulder to the back of the other. The motion should never be forced or painful.  Repeat the motion 10-20 times, or until you feel the neck muscles relax and loosen. Repeat __________ times. Complete the exercise __________ times per day. STRENGTHENING EXERCISES - Cervical Strain and Sprain These exercises may help you when beginning to rehabilitate your injury. They may resolve your symptoms with or without further involvement from your physician, physical therapist, or athletic trainer. While completing these exercises, remember:   Muscles can gain both the endurance and the strength needed for everyday activities through controlled exercises.  Complete these exercises as instructed by your physician, physical therapist, or athletic trainer. Progress the resistance and repetitions only as guided.  You may experience muscle soreness or fatigue, but the pain or discomfort you are trying to eliminate should never worsen during these exercises. If this pain does worsen, stop and make certain you are following the directions  exactly. If the pain is still present after adjustments, discontinue the exercise until you can discuss the trouble with your clinician. STRENGTH - Cervical Flexors, Isometric  Face a wall, standing about 6 inches away. Place a small pillow, a ball about 6-8 inches in diameter, or a folded towel between your forehead and the wall.  Slightly tuck your chin and gently push your forehead into the soft object. Push only with mild to moderate intensity, building up tension gradually. Keep your jaw and forehead relaxed.  Hold 10 to 20 seconds. Keep your breathing relaxed.  Release the tension slowly. Relax your neck muscles completely before you start the next repetition. Repeat __________ times. Complete this exercise __________ times per day. STRENGTH- Cervical Lateral Flexors, Isometric   Stand about 6 inches away from a wall. Place a small pillow, a ball about 6-8 inches in diameter, or a folded towel between the side of your head and the wall.  Slightly tuck your chin and gently tilt your head into the soft object. Push only with mild to moderate intensity, building up tension gradually. Keep your jaw and forehead relaxed.  Hold 10 to 20 seconds. Keep your breathing relaxed.  Release the tension slowly. Relax your neck muscles completely before you start the next repetition. Repeat __________ times. Complete this exercise __________ times per day. STRENGTH - Cervical Extensors, Isometric   Stand about 6 inches away from a wall. Place a small pillow, a ball about 6-8 inches in diameter, or a folded towel between the back of your head and the wall.  Slightly tuck your chin and gently tilt your head back into the soft object. Push only with mild to moderate intensity, building up tension gradually. Keep your jaw and forehead relaxed.  Hold 10 to 20 seconds. Keep your breathing relaxed.  Release the tension slowly. Relax your neck muscles completely before you start the next  repetition. Repeat __________ times. Complete this exercise __________ times per day. POSTURE AND BODY MECHANICS CONSIDERATIONS - Cervical Strain and Sprain Keeping correct posture when sitting, standing or completing your activities will reduce the stress put on different body tissues, allowing injured tissues a chance to heal and limiting painful experiences. The following are general guidelines for improved posture. Your physician or physical therapist will provide you with any instructions specific to your needs. While reading these guidelines, remember:  The exercises prescribed by your provider will help you have the flexibility and strength to maintain correct postures.  The correct posture provides the optimal environment for your joints to work. All of your joints have less wear and tear when properly supported by a spine with good posture. This means you will experience a healthier, less painful body.  Correct posture must be practiced with all of your activities, especially prolonged sitting and standing. Correct posture is as important when doing repetitive low-stress activities (typing) as it is when doing a single heavy-load activity (lifting). PROLONGED STANDING WHILE SLIGHTLY LEANING FORWARD When completing a task that requires you to lean forward while standing in one place for a long time, place either foot up on a stationary 2- to 4-inch high object to help maintain the best posture. When both feet are on the ground, the low back tends to lose its slight inward curve. If this curve flattens (or becomes too large), then the back and your other joints will experience too much stress, fatigue more quickly, and can cause pain.  RESTING POSITIONS Consider which positions are most painful for you when choosing a resting position. If you have pain with flexion-based activities (sitting, bending, stooping, squatting), choose a position that allows you to rest in a less flexed posture. You  would want to avoid curling into a fetal position on your side. If your pain worsens with extension-based activities (prolonged standing, working overhead), avoid resting in an extended position such as sleeping on your stomach. Most people will find more comfort when they rest with their spine in a more neutral position, neither too rounded nor too arched. Lying on a non-sagging bed on your side with a pillow between your knees, or on your back with a pillow under your knees will often provide some relief. Keep in mind, being in any one position for a prolonged period of time, no matter how correct your posture, can still lead to stiffness. WALKING Walk with an upright posture. Your ears, shoulders, and hips should all line up. OFFICE WORK When working at a desk, create an environment that supports good, upright posture. Without extra support, muscles fatigue and lead to excessive strain on joints and other tissues. CHAIR:  A chair should be  able to slide under your desk when your back makes contact with the back of the chair. This allows you to work closely.  The chair's height should allow your eyes to be level with the upper part of your monitor and your hands to be slightly lower than your elbows.  Body position:  Your feet should make contact with the floor. If this is not possible, use a foot rest.  Keep your ears over your shoulders. This will reduce stress on your neck and low back.   This information is not intended to replace advice given to you by your health care provider. Make sure you discuss any questions you have with your health care provider.   Document Released: 12/17/2005 Document Revised: 01/07/2015 Document Reviewed: 03/31/2009 Elsevier Interactive Patient Education [employer].
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
## 2016-03-01 ENCOUNTER — Ambulatory Visit (INDEPENDENT_AMBULATORY_CARE_PROVIDER_SITE_OTHER): Payer: Medicare Other

## 2016-03-01 DIAGNOSIS — Z1231 Encounter for screening mammogram for malignant neoplasm of breast: Secondary | ICD-10-CM | POA: Diagnosis not present

## 2016-03-01 DIAGNOSIS — Z139 Encounter for screening, unspecified: Secondary | ICD-10-CM

## 2016-03-20 DIAGNOSIS — G894 Chronic pain syndrome: Secondary | ICD-10-CM | POA: Diagnosis not present

## 2016-03-20 DIAGNOSIS — G501 Atypical facial pain: Secondary | ICD-10-CM | POA: Diagnosis not present

## 2016-03-26 ENCOUNTER — Other Ambulatory Visit: Payer: Self-pay | Admitting: Family Medicine

## 2016-04-04 DIAGNOSIS — G501 Atypical facial pain: Secondary | ICD-10-CM | POA: Diagnosis not present

## 2016-04-11 ENCOUNTER — Other Ambulatory Visit: Payer: Self-pay | Admitting: Family Medicine

## 2016-05-14 ENCOUNTER — Ambulatory Visit (INDEPENDENT_AMBULATORY_CARE_PROVIDER_SITE_OTHER): Payer: Medicare Other | Admitting: Family Medicine

## 2016-05-14 ENCOUNTER — Encounter: Payer: Self-pay | Admitting: Family Medicine

## 2016-05-14 ENCOUNTER — Other Ambulatory Visit: Payer: Self-pay | Admitting: Family Medicine

## 2016-05-14 VITALS — BP 108/72 | HR 97 | Wt 182.0 lb

## 2016-05-14 DIAGNOSIS — K2100 Gastro-esophageal reflux disease with esophagitis, without bleeding: Secondary | ICD-10-CM

## 2016-05-14 DIAGNOSIS — R7989 Other specified abnormal findings of blood chemistry: Secondary | ICD-10-CM

## 2016-05-14 DIAGNOSIS — R509 Fever, unspecified: Secondary | ICD-10-CM | POA: Diagnosis not present

## 2016-05-14 DIAGNOSIS — J0110 Acute frontal sinusitis, unspecified: Secondary | ICD-10-CM | POA: Diagnosis not present

## 2016-05-14 DIAGNOSIS — I10 Essential (primary) hypertension: Secondary | ICD-10-CM | POA: Diagnosis not present

## 2016-05-14 DIAGNOSIS — Z1321 Encounter for screening for nutritional disorder: Secondary | ICD-10-CM | POA: Diagnosis not present

## 2016-05-14 DIAGNOSIS — R5382 Chronic fatigue, unspecified: Secondary | ICD-10-CM | POA: Diagnosis not present

## 2016-05-14 DIAGNOSIS — R5383 Other fatigue: Secondary | ICD-10-CM

## 2016-05-14 LAB — COMPLETE METABOLIC PANEL WITH GFR
ALT: 13 U/L (ref 6–29)
AST: 17 U/L (ref 10–35)
Albumin: 3.7 g/dL (ref 3.6–5.1)
Alkaline Phosphatase: 64 U/L (ref 33–115)
BUN: 16 mg/dL (ref 7–25)
CHLORIDE: 107 mmol/L (ref 98–110)
CO2: 19 mmol/L — AB (ref 20–31)
CREATININE: 1.26 mg/dL — AB (ref 0.50–1.10)
Calcium: 9.2 mg/dL (ref 8.6–10.2)
GFR, EST AFRICAN AMERICAN: 58 mL/min — AB (ref >=60)
GFR, Est Non African American: 50 mL/min — ABNORMAL LOW (ref >=60)
Glucose, Bld: 88 mg/dL (ref 65–99)
POTASSIUM: 4.6 mmol/L (ref 3.5–5.3)
Sodium: 139 mmol/L (ref 135–146)
Total Bilirubin: 0.3 mg/dL (ref 0.2–1.2)
Total Protein: 6.5 g/dL (ref 6.1–8.1)

## 2016-05-14 LAB — CBC
HEMATOCRIT: 42 % (ref 35.0–45.0)
HEMOGLOBIN: 13.8 g/dL (ref 11.7–15.5)
MCH: 27.6 pg (ref 27.0–33.0)
MCHC: 32.9 g/dL (ref 32.0–36.0)
MCV: 84 fL (ref 80.0–100.0)
MPV: 11.5 fL (ref 7.5–12.5)
Platelets: 176 10*3/uL (ref 140–400)
RBC: 5 MIL/uL (ref 3.80–5.10)
RDW: 15 % (ref 11.0–15.0)
WBC: 10.3 10*3/uL (ref 3.8–10.8)

## 2016-05-14 LAB — FERRITIN: FERRITIN: 279 ng/mL — AB (ref 10–232)

## 2016-05-14 LAB — IRON AND TIBC
%SAT: 26 % (ref 11–50)
Iron: 84 ug/dL (ref 40–190)
TIBC: 325 ug/dL (ref 250–450)
UIBC: 241 ug/dL (ref 125–400)

## 2016-05-14 LAB — TSH: TSH: 2.05 mIU/L

## 2016-05-14 LAB — VITAMIN B12: VITAMIN B 12: 268 pg/mL (ref 200–1100)

## 2016-05-14 MED ORDER — AMOXICILLIN-POT CLAVULANATE 875-125 MG PO TABS: 1.0000 | ORAL_TABLET | Freq: Two times a day (BID) | ORAL | Status: DC

## 2016-05-14 MED ORDER — OMEPRAZOLE 40 MG PO CPDR: 40.0000 mg | DELAYED_RELEASE_CAPSULE | Freq: Every day | ORAL | Status: DC

## 2016-05-14 MED ORDER — ARIPIPRAZOLE 2 MG PO TABS: 2.0000 mg | ORAL_TABLET | Freq: Every day | ORAL | Status: DC

## 2016-05-14 NOTE — Progress Notes (Signed)
Subjective:    Patient ID: Desiree Wilson, female    DOB: 01-10-1966, 50 y.o.   MRN: [id number]  HPI Sinus congestion with yellow mucous for 1.5 weeks.  No fever, chills or sweats.   She has had mostly left frontal pain and pressure. She iss taking mucinex.   Has been fatigued for about 1 months and not sure why. Says has been excessively sleeping.  She does have a prior history of iron deficiency anemia and says she used to take iron for quite a long time but has not been on an a while. She denies any blood in the stool. She says she normally has constipation issues but did have diarrhea for almost 2 weeks when she first changed her diet. She wonders if her mood meds are causing her to be really fatigued.    Has been having more reflux and has been taking Pepcid AC.  She says about a month or so she decided to really work on losing weight. She cut out coffee and fried foods. She switch to more salads and fruits and vegetables and says that's when she started noticing more heartburn than usual. She's been taking some Pepcid before meals but hasn't really been helping very much. She has lost about 10 pounds since her weight loss efforts started.   Review of Systems BP 108/72 mmHg  Pulse 97  Wt 182 lb (82.555 kg)  SpO2 98%    Allergies  Allergen Reactions  . Azithromycin     REACTION: C Diff  . Methadone Hcl Other (See Comments)    REACTION: Hallucinations  . Phenobarbital     REACTION: Excited    Past Medical History  Diagnosis Date  . Allergy     rhinitis  . Depression   . Headache(784.0)   . Hyperlipidemia   . Osteoarthritis   . Facial paralysis     right face/ chronic pain syndrome (pain management Dr Sydell Axon)  . Trigeminal neuralgia     right  . Hypotension   . Blood transfusion   . Anemia   . Iron deficiency anemia 11/15/2014  . Iron malabsorption 11/15/2014  . Blood transfusion without reported diagnosis     Past Surgical History  Procedure Laterality Date   . Osteotomy    . Mandible surgery    . Plastic facial surgery      Social History   Social History  . Marital Status: Single    Spouse Name: N/A  . Number of Children: N/A  . Years of Education: N/A   Occupational History  . Not on file.   Social History Main Topics  . Smoking status: Former Smoker -- 6 years    Types: Cigarettes    Quit date: 12/31/2000  . Smokeless tobacco: Not on file  . Alcohol Use: 0.6 oz/week    1 Glasses of wine per week  . Drug Use: No  . Sexual Activity:    Partners: Male   Other Topics Concern  . Not on file   Social History Narrative   Water aerobics several days per week. Lives with her boyfriend.     Family History  Problem Relation Age of Onset  . Stroke Father     Outpatient Encounter Prescriptions as of 05/14/2016  Medication Sig  . amoxicillin-clavulanate (AUGMENTIN) 875-125 MG tablet Take 1 tablet by mouth 2 (two) times daily.  . ARIPiprazole (ABILIFY) 2 MG tablet Take 1 tablet (2 mg total) by mouth daily.  Marland Kitchen BELBUCA 300 MCG  FILM   . clonazePAM (KLONOPIN) 1 MG tablet Take 1 tablet (1 mg total) by mouth 2 (two) times daily.  . diclofenac sodium (VOLTAREN) 1 % GEL Apply topically 4 (four) times daily.  . frovatriptan (FROVA) 2.5 MG tablet Take 2.5 mg by mouth daily.    Marland Kitchen ketamine (KETALAR) 10 MG/ML injection Inject into the skin.  . NUCYNTA 50 MG TABS tablet Once daily  . omeprazole (PRILOSEC) 40 MG capsule Take 1 capsule (40 mg total) by mouth daily.  . rosuvastatin (CRESTOR) 10 MG tablet TAKE 1 TABLET BY MOUTH EVERY DAY  . sertraline (ZOLOFT) 100 MG tablet TAKE 1 AND 1/2 TABLETS(150 MG) BY MOUTH DAILY  . tiZANidine (ZANAFLEX) 4 MG tablet Take 4 mg by mouth every 6 (six) hours as needed.  . topiramate (TOPAMAX) 100 MG tablet Take by mouth. Take 3 tabs once daily   . traMADol (ULTRAM) 50 MG tablet Take 50 mg by mouth 4 (four) times daily.    . traZODone (DESYREL) 100 MG tablet   . verapamil (CALAN-SR) 180 MG CR tablet Take 1  tablet (180 mg total) by mouth daily.  . VESTURA 3-0.02 MG tablet TAKE 1 TABLET BY MOUTH DAILY  . [DISCONTINUED] ARIPiprazole (ABILIFY) 5 MG tablet TAKE 1 TABLET(5 MG) BY MOUTH DAILY  . [DISCONTINUED] HYDROcodone-acetaminophen (NORCO/VICODIN) 5-325 MG tablet Take 1-2 tablets by mouth every 6 (six) hours as needed.  . [DISCONTINUED] ibuprofen (ADVIL,MOTRIN) 800 MG tablet Take 1 tablet (800 mg total) by mouth 3 (three) times daily.   No facility-administered encounter medications on file as of 05/14/2016.          Objective:   Physical Exam  Constitutional: She is oriented to person, place, and time. She appears well-developed and well-nourished.  HENT:  Head: Normocephalic and atraumatic.  Right Ear: External ear normal.  Left Ear: External ear normal.  Nose: Nose normal.  Mouth/Throat: Oropharynx is clear and moist.  TMs and canals are clear. Mild proptosis of the left eye  Eyes: Conjunctivae and EOM are normal. Pupils are equal, round, and reactive to light.  Neck: Neck supple. No thyromegaly present.  Some mildly tender ant cerv LN  Cardiovascular: Normal rate, regular rhythm and normal heart sounds.   Pulmonary/Chest: Effort normal and breath sounds normal. She has no wheezes.  Abdominal: Soft. Bowel sounds are normal. She exhibits no distension and no mass. There is tenderness. There is no rebound and no guarding.  Mild epigastric tenderness.   Lymphadenopathy:    She has cervical adenopathy.  Neurological: She is alert and oriented to person, place, and time.  Skin: Skin is warm and dry.  Psychiatric: She has a normal mood and affect.          Assessment & Plan:  Acute left frontal sinusitis - Will tx with Augmentin. Call if nto better in one week.   GERD - will change to a PPI.  Review reflux hygiene.   Fatigue - will work up for iron def anemia.  Has a prior hx.  Recheck thyroid, etc.  Will dec abilify as well.

## 2016-05-14 NOTE — Patient Instructions (Signed)
Food Choices for Gastroesophageal Reflux Disease, Adult When you have gastroesophageal reflux disease (GERD), the foods you eat and your eating habits are very important. Choosing the right foods can help ease the discomfort of GERD. WHAT GENERAL GUIDELINES DO I NEED TO FOLLOW?  Choose fruits, vegetables, whole grains, low-fat dairy products, and low-fat meat, fish, and poultry.  Limit fats such as oils, salad dressings, butter, nuts, and avocado.  Keep a food diary to identify foods that cause symptoms.  Avoid foods that cause reflux. These may be different for different people.  Eat frequent small meals instead of three large meals each day.  Eat your meals slowly, in a relaxed setting.  Limit fried foods.  Cook foods using methods other than frying.  Avoid drinking alcohol.  Avoid drinking large amounts of liquids with your meals.  Avoid bending over or lying down until 2-3 hours after eating. WHAT FOODS ARE NOT RECOMMENDED? The following are some foods and drinks that may worsen your symptoms: Vegetables Tomatoes. Tomato juice. Tomato and spaghetti sauce. Chili peppers. Onion and garlic. Horseradish. Fruits Oranges, grapefruit, and lemon (fruit and juice). Meats High-fat meats, fish, and poultry. This includes hot dogs, ribs, ham, sausage, salami, and bacon. Dairy Whole milk and chocolate milk. Sour cream. Cream. Butter. Ice cream. Cream cheese.  Beverages Coffee and tea, with or without caffeine. Carbonated beverages or energy drinks. Condiments Hot sauce. Barbecue sauce.  Sweets/Desserts Chocolate and cocoa. Donuts. Peppermint and spearmint. Fats and Oils High-fat foods, including French fries and potato chips. Other Vinegar. Strong spices, such as black pepper, white pepper, red pepper, cayenne, curry powder, cloves, ginger, and chili powder. The items listed above may not be a complete list of foods and beverages to avoid. Contact your dietitian for more  information.   This information is not intended to replace advice given to you by your health care provider. Make sure you discuss any questions you have with your health care provider.   Document Released: 12/17/2005 Document Revised: 01/07/2015 Document Reviewed: 10/21/2013 Elsevier Interactive Patient Education 2016 [employer] Inc.  

## 2016-05-15 LAB — VITAMIN D 25 HYDROXY (VIT D DEFICIENCY, FRACTURES): VIT D 25 HYDROXY: 13 ng/mL — AB (ref 30–100)

## 2016-05-15 NOTE — Addendum Note (Signed)
Addended by: Teddy Spike on: 05/15/2016 08:02 AM   Modules accepted: Orders

## 2016-05-18 ENCOUNTER — Ambulatory Visit: Payer: Medicare Other | Admitting: Family Medicine

## 2016-05-21 DIAGNOSIS — G43719 Chronic migraine without aura, intractable, without status migrainosus: Secondary | ICD-10-CM | POA: Diagnosis not present

## 2016-06-02 ENCOUNTER — Other Ambulatory Visit: Payer: Self-pay | Admitting: Family Medicine

## 2016-06-06 ENCOUNTER — Encounter: Payer: Self-pay | Admitting: Family Medicine

## 2016-06-06 DIAGNOSIS — G501 Atypical facial pain: Secondary | ICD-10-CM | POA: Diagnosis not present

## 2016-06-06 DIAGNOSIS — G500 Trigeminal neuralgia: Secondary | ICD-10-CM | POA: Diagnosis not present

## 2016-06-06 DIAGNOSIS — G894 Chronic pain syndrome: Secondary | ICD-10-CM | POA: Diagnosis not present

## 2016-06-06 DIAGNOSIS — E559 Vitamin D deficiency, unspecified: Secondary | ICD-10-CM | POA: Insufficient documentation

## 2016-06-11 ENCOUNTER — Encounter: Payer: Self-pay | Admitting: Family Medicine

## 2016-06-11 ENCOUNTER — Ambulatory Visit (INDEPENDENT_AMBULATORY_CARE_PROVIDER_SITE_OTHER): Payer: Medicare Other | Admitting: Family Medicine

## 2016-06-11 VITALS — BP 121/79 | HR 71 | Wt 184.0 lb

## 2016-06-11 DIAGNOSIS — J301 Allergic rhinitis due to pollen: Secondary | ICD-10-CM | POA: Diagnosis not present

## 2016-06-11 DIAGNOSIS — R7989 Other specified abnormal findings of blood chemistry: Secondary | ICD-10-CM | POA: Diagnosis not present

## 2016-06-11 DIAGNOSIS — K2100 Gastro-esophageal reflux disease with esophagitis, without bleeding: Secondary | ICD-10-CM | POA: Insufficient documentation

## 2016-06-11 DIAGNOSIS — I10 Essential (primary) hypertension: Secondary | ICD-10-CM | POA: Diagnosis not present

## 2016-06-11 MED ORDER — DROSPIRENONE-ETHINYL ESTRADIOL 3-0.02 MG PO TABS: 1.0000 | ORAL_TABLET | Freq: Every day | ORAL | Status: DC

## 2016-06-11 NOTE — Patient Instructions (Signed)
Please schedule a CPE with pap in July.

## 2016-06-11 NOTE — Progress Notes (Addendum)
Subjective:    CC:   HPI:  GERD - She is having more reflux symptoms when I saw her about 4 weeks ago so we decided to switch her to a PPI. And reviewed reflux hygiene. She is on omeprazole 40 mg daily and does feel like her symptoms have improved. Next  Vitamin D deficiency-her recent labs showed that her vitamin D was still low. He has started a vitamin D supplement. Plan will be to recheck her level again in about 4 weeks from today. Vitamin D level was 13.   Contraception-she's requesting a refill on her birth control. She says it was denied at the pharmacy last week.  Hypertension- Pt denies chest pain, SOB, dizziness, or heart palpitations.  Taking meds as directed w/o problems.  Denies medication side effects.    Is also been having a lot of allergy symptoms. Watery itchy eyes and sneezing and nasal congestion. She wants to know what would be safe to take over-the-counter.  Past medical history, Surgical history, Family history not pertinant except as noted below, Social history, Allergies, and medications have been entered into the medical record, reviewed, and corrections made.   Review of Systems: No fevers, chills, night sweats, weight loss, chest pain, or shortness of breath.   Objective:    General: Well Developed, well nourished, and in no acute distress.  Neuro: Alert and oriented x3, extra-ocular muscles intact, sensation grossly intact.  HEENT: Normocephalic, atraumatic  Skin: Warm and dry, no rashes. Cardiac: Regular rate and rhythm, no murmurs rubs or gallops, no lower extremity edema.  Respiratory: Clear to auscultation bilaterally. Not using accessory muscles, speaking in full sentences.   Impression and Recommendations:   GERD- much better controlled on the PPI. Discussed we will continue this for another month or 2 at that point try to wean her back off.  Vit D def - she has started a supplement. Plan will be to recheck vitamin D when I see her back in July for  her wellness exam.  We did go ahead and refill her birth control today. We had sent a prescription last week but the pharmacy says that we actually denied it. We did send a new prescription today and she will plan to schedule her Pap smear in July.  HTN - well controlled.    Allergic Rhinitis-recommend trial of nasal steroid spray either Flonase or Nasacort both of which are over-the-counter. Start with 2 sprays in each nostril for the first week and then decrease down to 1 spray in each nostril.

## 2016-07-04 DIAGNOSIS — G501 Atypical facial pain: Secondary | ICD-10-CM | POA: Diagnosis not present

## 2016-07-04 DIAGNOSIS — Z79899 Other long term (current) drug therapy: Secondary | ICD-10-CM | POA: Diagnosis not present

## 2016-07-04 DIAGNOSIS — Z5181 Encounter for therapeutic drug level monitoring: Secondary | ICD-10-CM | POA: Diagnosis not present

## 2016-07-04 DIAGNOSIS — G894 Chronic pain syndrome: Secondary | ICD-10-CM | POA: Diagnosis not present

## 2016-07-04 DIAGNOSIS — G500 Trigeminal neuralgia: Secondary | ICD-10-CM | POA: Diagnosis not present

## 2016-07-05 ENCOUNTER — Other Ambulatory Visit: Payer: Self-pay | Admitting: Family Medicine

## 2016-07-12 ENCOUNTER — Ambulatory Visit (INDEPENDENT_AMBULATORY_CARE_PROVIDER_SITE_OTHER): Payer: Medicare Other | Admitting: Family Medicine

## 2016-07-12 ENCOUNTER — Other Ambulatory Visit (HOSPITAL_COMMUNITY)
Admission: RE | Admit: 2016-07-12 | Discharge: 2016-07-12 | Disposition: A | Discharge status: Home / Self Care | Payer: Medicare Other | Source: Ambulatory Visit | Attending: Family Medicine | Admitting: Family Medicine

## 2016-07-12 ENCOUNTER — Encounter: Payer: Self-pay | Admitting: Family Medicine

## 2016-07-12 VITALS — BP 111/75 | HR 93 | Temp 98.0°F | Ht 65.0 in | Wt 185.0 lb

## 2016-07-12 DIAGNOSIS — H9191 Unspecified hearing loss, right ear: Secondary | ICD-10-CM | POA: Diagnosis not present

## 2016-07-12 DIAGNOSIS — Z0000 Encounter for general adult medical examination without abnormal findings: Secondary | ICD-10-CM | POA: Diagnosis not present

## 2016-07-12 DIAGNOSIS — Z01419 Encounter for gynecological examination (general) (routine) without abnormal findings: Secondary | ICD-10-CM | POA: Insufficient documentation

## 2016-07-12 DIAGNOSIS — Z1151 Encounter for screening for human papillomavirus (HPV): Secondary | ICD-10-CM | POA: Insufficient documentation

## 2016-07-12 NOTE — Progress Notes (Signed)
Subjective:   Desiree Wilson is a 50 y.o. female who presents for Medicare Annual (Subsequent) preventive examination.  She does report that she's had a cold for about 4 days. She's been using some Mucinex and some Flonase and she says she has started to feel a little bit better today. No fevers chills or sweats. Her husband has been sick as well.  She walks her dog and does water aerobics.  Review of Systems:  comprehensive ROS is negative.          Objective:     Vitals: BP 111/75 mmHg  Pulse 93  Temp(Src) 98 F (36.7 C) (Oral)  Ht 5\' 5"  (1.651 m)  Wt 185 lb (83.915 kg)  BMI 30.79 kg/m2  SpO2 98%  Body mass index is 30.79 kg/(m^2).   Tobacco History  Smoking status  . Former Smoker -- 6 years  . Types: Cigarettes  . Quit date: 12/31/2000  Smokeless tobacco  . Not on file     Counseling given: Not Answered   Past Medical History  Diagnosis Date  . Allergy     rhinitis  . Depression   . Headache(784.0)   . Hyperlipidemia   . Osteoarthritis   . Facial paralysis     right face/ chronic pain syndrome (pain management Dr Sydell Axon)  . Trigeminal neuralgia     right  . Hypotension   . Blood transfusion   . Anemia   . Iron deficiency anemia 11/15/2014  . Iron malabsorption 11/15/2014  . Blood transfusion without reported diagnosis    Past Surgical History  Procedure Laterality Date  . Osteotomy    . Mandible surgery    . Plastic facial surgery     Family History  Problem Relation Age of Onset  . Stroke Father    History  Sexual Activity  . Sexual Activity:  . Partners: Male    Outpatient Encounter Prescriptions as of 07/12/2016  Medication Sig  . ARIPiprazole (ABILIFY) 5 MG tablet 2 mg.  . BELBUCA 300 MCG FILM   . cholecalciferol (VITAMIN D) 1000 units tablet Take 2,000 Units by mouth daily.  . clonazePAM (KLONOPIN) 1 MG tablet Take 1 tablet (1 mg total) by mouth 2 (two) times daily.  . diclofenac sodium (VOLTAREN) 1 % GEL Apply topically 4  (four) times daily.  . drospirenone-ethinyl estradiol (VESTURA) 3-0.02 MG tablet Take 1 tablet by mouth daily.  . fluticasone (FLONASE) 50 MCG/ACT nasal spray Place into both nostrils daily.  . frovatriptan (FROVA) 2.5 MG tablet Take 2.5 mg by mouth daily.    Marland Kitchen ketamine (KETALAR) 10 MG/ML injection Inject into the skin.  . Multiple Vitamin (MULTIVITAMIN) capsule Take 1 capsule by mouth daily.  . NUCYNTA 50 MG TABS tablet Once daily  . omeprazole (PRILOSEC) 40 MG capsule Take 1 capsule (40 mg total) by mouth daily.  . Oxcarbazepine (TRILEPTAL) 300 MG tablet Take 300 mg by mouth 2 (two) times daily.  . rosuvastatin (CRESTOR) 10 MG tablet TAKE 1 TABLET BY MOUTH EVERY DAY  . sertraline (ZOLOFT) 100 MG tablet TAKE 1 AND 1/2 TABLETS(150 MG) BY MOUTH DAILY  . tiZANidine (ZANAFLEX) 4 MG tablet Take 4 mg by mouth every 6 (six) hours as needed.  . topiramate (TOPAMAX) 100 MG tablet Take by mouth. Take 3 tabs once daily   . verapamil (CALAN-SR) 180 MG CR tablet Take 1 tablet (180 mg total) by mouth daily.  . [DISCONTINUED] traMADol (ULTRAM) 50 MG tablet Take 50 mg by mouth  4 (four) times daily.    . [DISCONTINUED] traZODone (DESYREL) 100 MG tablet    No facility-administered encounter medications on file as of 07/12/2016.    Activities of Daily Living In your present state of health, do you have any difficulty performing the following activities: 07/12/2016  Hearing? Y  Vision? N  Difficulty concentrating or making decisions? N  Walking or climbing stairs? N  Dressing or bathing? N  Doing errands, shopping? N    Patient Care Team: Hali Marry, MD as PCP - General (Family Medicine) Elmarie Shiley, MD as Consulting Physician (Nephrology) Janine Ores. Rauck, MD as Consulting Physician (Pain Medicine)    Assessment:    Medicare Wellness Exam  Exercise Activities and Dietary recommendations    Goals    None     Fall Risk Fall Risk  07/12/2016 12/21/2014 11/19/2014  Falls in the past  year? No No No   Depression Screen PHQ 2/9 Scores 07/12/2016 06/17/2015 03/11/2014  PHQ - 2 Score 0 1 0  PHQ- 9 Score - 6 2     Cognitive Testing No flowsheet data found.  Immunization History  Administered Date(s) Administered  . Influenza,inj,Quad PF,36+ Mos 08/17/2014, 08/26/2015  . Influenza-Unspecified 09/30/2013  . Td 08/10/2010   Screening Tests Health Maintenance  Topic Date Due  . HIV Screening  09/16/1981  . PAP SMEAR  07/24/2016  . INFLUENZA VACCINE  07/31/2016  . TETANUS/TDAP  08/10/2020      Plan:    Medicare Wellness Exam   During the course of the visit the patient was educated and counseled about the following appropriate screening and preventive services:   Vaccines to UTD.   Cardiovascular Disease  Colorectal cancer screening - she will turn 50 this fall so discussed options. She thinks she wants to do colonoscopy  Bone density screening  Diabetes screening  Glaucoma screening  Mammography/PAP  Nutrition counseling    Upper respiratory infection-likely viral. Call if not better after the weekend.  Patient Instructions (the written plan) was given to the patient.   Traeson Dusza, MD  07/12/2016

## 2016-07-12 NOTE — Patient Instructions (Signed)
Keep up a regular exercise program and make sure you are eating a healthy diet Try to eat 4 servings of dairy a day, or if you are lactose intolerant take a calcium with vitamin D daily.  Your vaccines are up to date.   

## 2016-07-16 LAB — CYTOLOGY - PAP

## 2016-07-17 DIAGNOSIS — H524 Presbyopia: Secondary | ICD-10-CM | POA: Diagnosis not present

## 2016-07-25 DIAGNOSIS — G501 Atypical facial pain: Secondary | ICD-10-CM | POA: Diagnosis not present

## 2016-07-25 DIAGNOSIS — G894 Chronic pain syndrome: Secondary | ICD-10-CM | POA: Diagnosis not present

## 2016-08-03 ENCOUNTER — Other Ambulatory Visit: Payer: Self-pay | Admitting: Family Medicine

## 2016-08-14 DIAGNOSIS — G501 Atypical facial pain: Secondary | ICD-10-CM | POA: Diagnosis not present

## 2016-08-20 ENCOUNTER — Encounter: Payer: Self-pay | Admitting: Family Medicine

## 2016-08-20 ENCOUNTER — Ambulatory Visit (INDEPENDENT_AMBULATORY_CARE_PROVIDER_SITE_OTHER): Payer: Medicare Other | Admitting: Family Medicine

## 2016-08-20 ENCOUNTER — Ambulatory Visit (INDEPENDENT_AMBULATORY_CARE_PROVIDER_SITE_OTHER): Payer: Medicare Other

## 2016-08-20 VITALS — BP 131/97 | HR 87 | Wt 186.0 lb

## 2016-08-20 DIAGNOSIS — R109 Unspecified abdominal pain: Secondary | ICD-10-CM

## 2016-08-20 DIAGNOSIS — K5900 Constipation, unspecified: Secondary | ICD-10-CM

## 2016-08-20 DIAGNOSIS — K5909 Other constipation: Secondary | ICD-10-CM

## 2016-08-20 DIAGNOSIS — R197 Diarrhea, unspecified: Secondary | ICD-10-CM | POA: Diagnosis not present

## 2016-08-20 DIAGNOSIS — K591 Functional diarrhea: Secondary | ICD-10-CM | POA: Diagnosis not present

## 2016-08-20 DIAGNOSIS — Z23 Encounter for immunization: Secondary | ICD-10-CM

## 2016-08-20 NOTE — Progress Notes (Signed)
Subjective:    CC: constipation/diarrhea  HPI: 50 yo female with hx of CKD, Trigeminal neuralgia, myofascial pain who presents today with Diarrhea. Normally her baseline is constipation and she has struggled with this for almost 12 years but over the last 3 months she has started having watery liquidy stools. They can be so urgent at times that she actually has accidents. She's now getting very anxious and doesn't want to leave home. She says she's tried taking Imodium but it doesn't seem to really work. She denies any fevers chills or significant abdominal pain. She says she's also been sleeping a lot more than her usual. In fact she has uneventfully needed her sleep medication for almost a month and a half because she's been excessively sleepy.  Past medical history, Surgical history, Family history not pertinant except as noted below, Social history, Allergies, and medications have been entered into the medical record, reviewed, and corrections made.   Review of Systems: No fevers, chills, night sweats, weight loss, chest pain, or shortness of breath.   Objective:    General: Well Developed, well nourished, and in no acute distress.  Neuro: Alert and oriented x3, extra-ocular muscles intact, sensation grossly intact.  HEENT: Normocephalic, atraumatic  Skin: Warm and dry, no rashes. Cardiac: Regular rate and rhythm, no murmurs rubs or gallops, no lower extremity edema.  Respiratory: Clear to auscultation bilaterally. Not using accessory muscles, speaking in full sentences. Abd: soft , nontender. No OM, no bloating or inc tympany.     Impression and Recommendations:   Diarrhea on top of chronic constipation-suspect that she probably still is constipated with some leakage of stool is we'll get a plain film x-ray today. They consider infectious causes so we'll also do a stool culture and check for C. difficile. We'll also check a CBC. Can consider medication for as needed use for traveling she  is planning on traveling for vacation in a couple weeks for her birthday. Also recommend going ahead and scheduling her colonoscopy as she will turn 50 in about 3 weeks.

## 2016-08-21 DIAGNOSIS — G43719 Chronic migraine without aura, intractable, without status migrainosus: Secondary | ICD-10-CM | POA: Diagnosis not present

## 2016-08-22 DIAGNOSIS — K591 Functional diarrhea: Secondary | ICD-10-CM | POA: Diagnosis not present

## 2016-08-23 LAB — C. DIFFICILE GDH AND TOXIN A/B
C. difficile GDH: NOT DETECTED
C. difficile Toxin A/B: NOT DETECTED

## 2016-08-24 MED ORDER — DIPHENOXYLATE-ATROPINE 2.5-0.025 MG PO TABS: 1.0000 | ORAL_TABLET | Freq: Four times a day (QID) | ORAL | 0 refills | Status: DC | PRN

## 2016-08-24 NOTE — Addendum Note (Signed)
Addended by: [name] D on: 08/24/2016 08:18 AM   Modules accepted: Orders

## 2016-08-26 LAB — STOOL CULTURE

## 2016-08-27 ENCOUNTER — Telehealth: Payer: Self-pay | Admitting: *Deleted

## 2016-08-27 NOTE — Telephone Encounter (Signed)
PA initiated for gen Lomotil through covermymeds Key: [id number]

## 2016-09-04 ENCOUNTER — Other Ambulatory Visit: Payer: Self-pay | Admitting: Family Medicine

## 2016-09-14 ENCOUNTER — Telehealth: Payer: Self-pay

## 2016-09-14 DIAGNOSIS — Z1211 Encounter for screening for malignant neoplasm of colon: Secondary | ICD-10-CM

## 2016-09-14 NOTE — Telephone Encounter (Signed)
Desiree Wilson wants a referral for a screening colonoscopy.

## 2016-09-17 ENCOUNTER — Encounter: Payer: Self-pay | Admitting: Gastroenterology

## 2016-09-17 IMAGING — DX DG ABDOMEN 1V
2 series · 2 of 2 positions shown · non-contrast
Comparison: None.

CLINICAL DATA: Abdominal pain and intermittent diarrhea

EXAM:
ABDOMEN - 1 VIEW

[abdomen kub (1 of 2)]
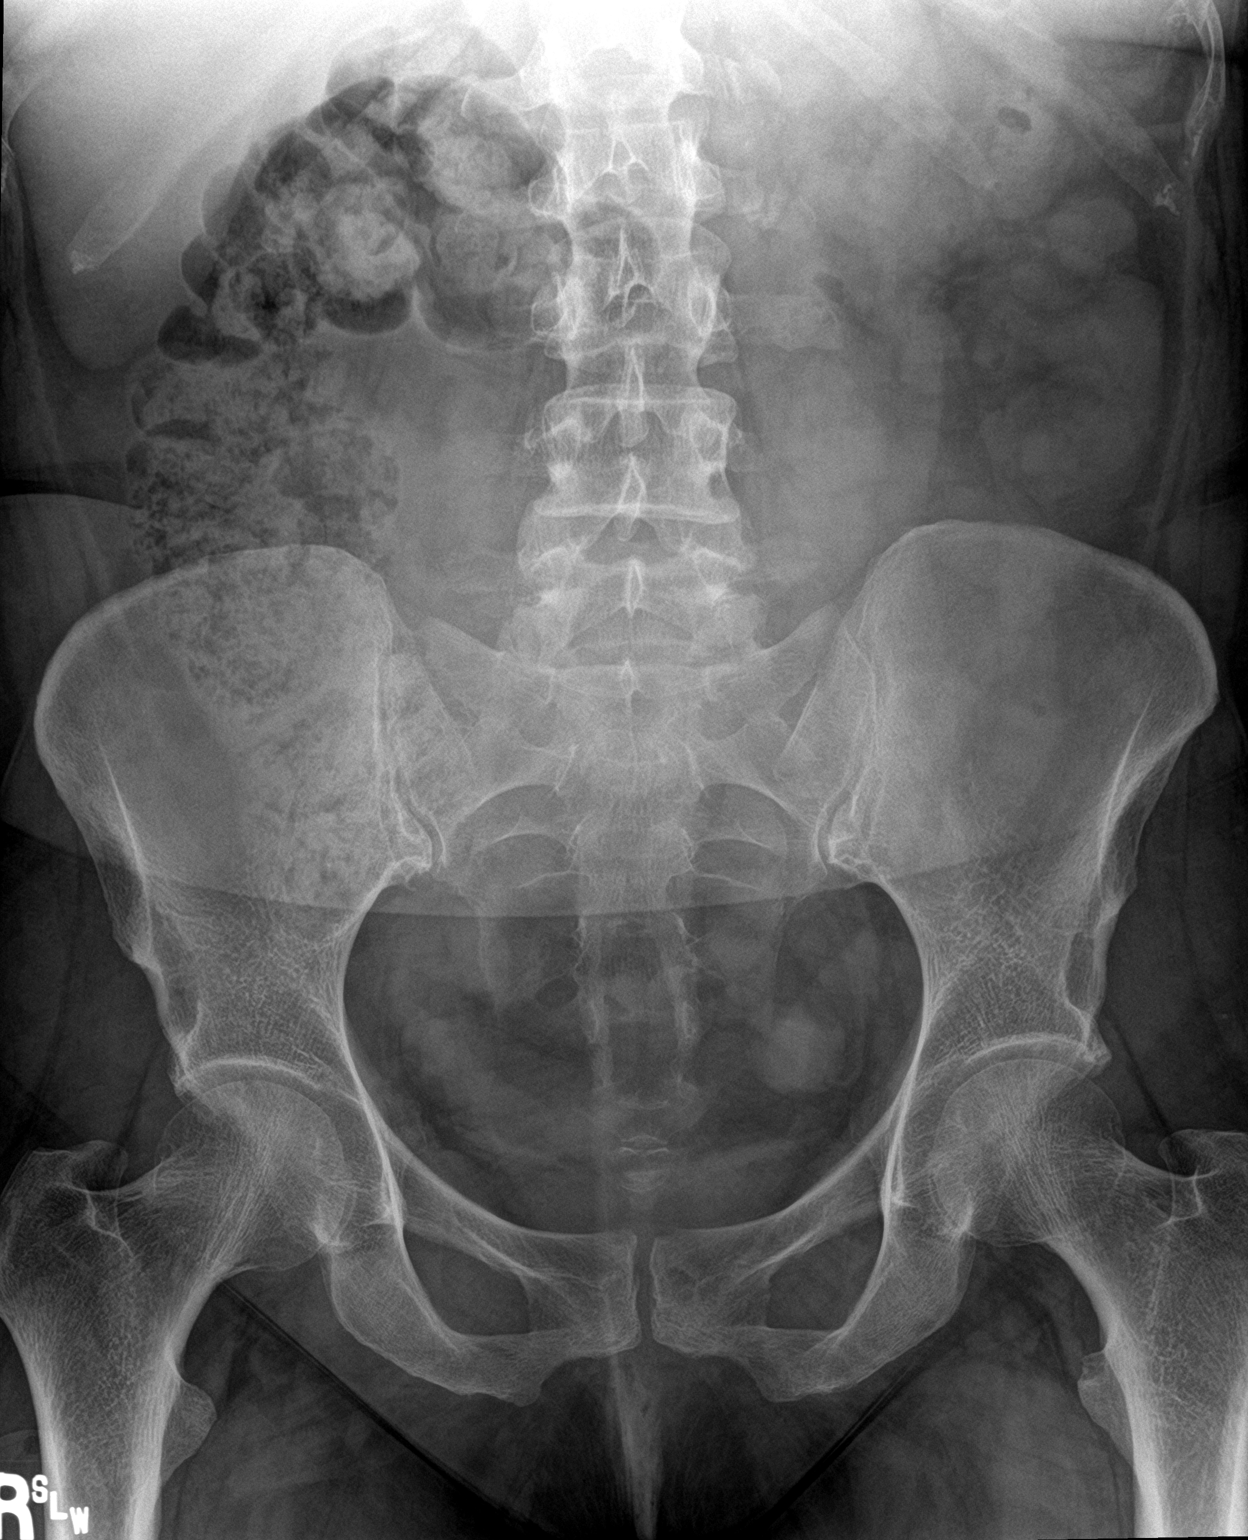

[abdomen kub (2 of 2)]
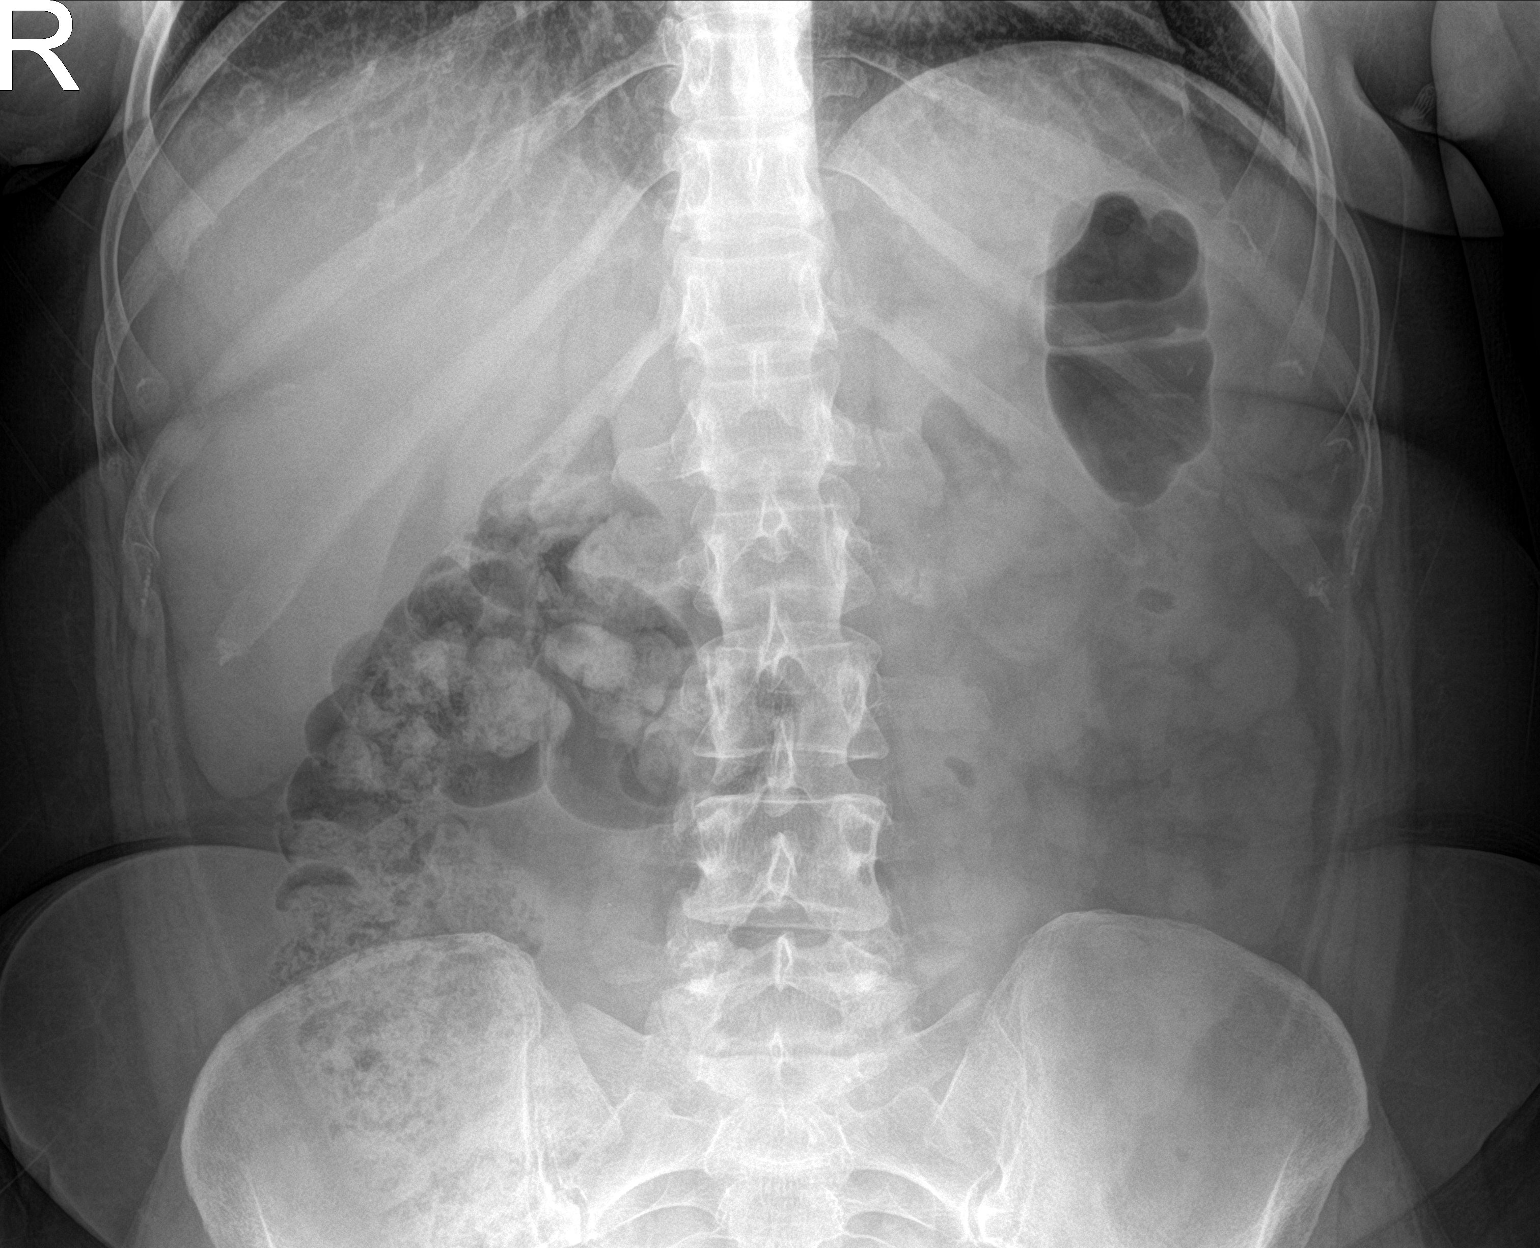

[2 of 2 positions shown; findings below may reference images not displayed]

FINDINGS: Scattered large and small bowel gas is noted. Fecal material is
noted within the right colon. No obstructive changes are seen. No
abnormal mass or abnormal calcifications are noted. No bony
abnormality is seen.
IMPRESSION: No acute abnormality noted.

## 2016-09-19 DIAGNOSIS — G501 Atypical facial pain: Secondary | ICD-10-CM | POA: Diagnosis not present

## 2016-09-19 DIAGNOSIS — G894 Chronic pain syndrome: Secondary | ICD-10-CM | POA: Diagnosis not present

## 2016-09-29 ENCOUNTER — Other Ambulatory Visit: Payer: Self-pay | Admitting: Family Medicine

## 2016-10-30 ENCOUNTER — Other Ambulatory Visit: Payer: Self-pay | Admitting: Family Medicine

## 2016-11-06 DIAGNOSIS — G501 Atypical facial pain: Secondary | ICD-10-CM | POA: Diagnosis not present

## 2016-11-09 DIAGNOSIS — G4701 Insomnia due to medical condition: Secondary | ICD-10-CM | POA: Diagnosis not present

## 2016-11-09 DIAGNOSIS — G894 Chronic pain syndrome: Secondary | ICD-10-CM | POA: Diagnosis not present

## 2016-11-09 NOTE — Telephone Encounter (Signed)
Outcome  Approvedon August 28  Effective from 08/27/2016 through 08/27/2017.

## 2016-11-16 ENCOUNTER — Ambulatory Visit (AMBULATORY_SURGERY_CENTER): Payer: Self-pay

## 2016-11-16 VITALS — Ht 65.0 in | Wt 190.0 lb

## 2016-11-16 DIAGNOSIS — Z1211 Encounter for screening for malignant neoplasm of colon: Secondary | ICD-10-CM

## 2016-11-16 MED ORDER — SUPREP BOWEL PREP KIT 17.5-3.13-1.6 GM/177ML PO SOLN: 1.0000 | Freq: Once | ORAL | 0 refills | Status: AC

## 2016-11-16 NOTE — Progress Notes (Signed)
No allergies to eggs or soy No past problems with anesthesia No diet meds No home oxygen  Registered for emmi 

## 2016-11-17 ENCOUNTER — Other Ambulatory Visit: Payer: Self-pay | Admitting: Family Medicine

## 2016-11-25 ENCOUNTER — Other Ambulatory Visit: Payer: Self-pay | Admitting: Family Medicine

## 2016-11-26 ENCOUNTER — Encounter: Payer: Self-pay | Admitting: Gastroenterology

## 2016-12-07 ENCOUNTER — Ambulatory Visit (AMBULATORY_SURGERY_CENTER): Payer: Medicare Other | Admitting: Gastroenterology

## 2016-12-07 ENCOUNTER — Encounter: Payer: Self-pay | Admitting: Gastroenterology

## 2016-12-07 VITALS — BP 132/76 | HR 67 | Temp 96.8°F | Resp 18 | Ht 65.0 in | Wt 190.0 lb

## 2016-12-07 DIAGNOSIS — D123 Benign neoplasm of transverse colon: Secondary | ICD-10-CM

## 2016-12-07 DIAGNOSIS — Z1211 Encounter for screening for malignant neoplasm of colon: Secondary | ICD-10-CM

## 2016-12-07 DIAGNOSIS — D120 Benign neoplasm of cecum: Secondary | ICD-10-CM | POA: Diagnosis not present

## 2016-12-07 DIAGNOSIS — D124 Benign neoplasm of descending colon: Secondary | ICD-10-CM | POA: Diagnosis not present

## 2016-12-07 DIAGNOSIS — Z1212 Encounter for screening for malignant neoplasm of rectum: Secondary | ICD-10-CM | POA: Diagnosis not present

## 2016-12-07 MED ORDER — SODIUM CHLORIDE 0.9 % IV SOLN: 500.0000 mL | INTRAVENOUS | Status: DC

## 2016-12-07 NOTE — Progress Notes (Signed)
No problems noted in the recovery room. maw 

## 2016-12-07 NOTE — Op Note (Signed)
[facility] Patient Name: Desiree Wilson Procedure Date: 12/07/2016 9:08 AM MRN: [id number] Endoscopist: Remo Lipps P. [name] MD, MD Age: 49 Referring MD:  Date of Birth: 06-10-1966 Gender: Female Account #: [id number] Procedure:                Colonoscopy Indications:              Screening for colorectal malignant neoplasm, This                            is the patient's first colonoscopy. Patient                            incidentally reports chronic diarrhea during                            history obtained prior to starting procedure Medicines:                Monitored Anesthesia Care Procedure:                Pre-Anesthesia Assessment:                           - Prior to the procedure, a History and Physical                            was performed, and patient medications and                            allergies were reviewed. The patient's tolerance of                            previous anesthesia was also reviewed. The risks                            and benefits of the procedure and the sedation                            options and risks were discussed with the patient.                            All questions were answered, and informed consent                            was obtained. Prior Anticoagulants: The patient has                            taken no previous anticoagulant or antiplatelet                            agents. ASA Grade Assessment: II - A patient with                            mild systemic disease. After reviewing the risks  and benefits, the patient was deemed in                            satisfactory condition to undergo the procedure.                           After obtaining informed consent, the colonoscope                            was passed under direct vision. Throughout the                            procedure, the patient's blood pressure, pulse, and                            oxygen saturations  were monitored continuously. The                            Model PCF-H190L [id number]) scope was introduced                            through the anus and advanced to the the cecum,                            identified by appendiceal orifice and ileocecal                            valve. The colonoscopy was performed without                            difficulty. The patient tolerated the procedure                            well. The quality of the bowel preparation was                            good. The ileocecal valve, appendiceal orifice, and                            rectum were photographed. Scope In: 9:14:34 AM Scope Out: 9:37:05 AM Scope Withdrawal Time: 0 hours 19 minutes 25 seconds  Total Procedure Duration: 0 hours 22 minutes 31 seconds  Findings:                 The perianal exam findings include non-thrombosed                            external hemorrhoids.                           A 3 mm polyp was found in the ileocecal valve. The                            polyp was sessile. The polyp was removed with a  cold biopsy forceps. Resection and retrieval were                            complete.                           A 6 mm polyp was found in the hepatic flexure. The                            polyp was sessile. The polyp was removed with a                            cold snare. Resection and retrieval were complete.                           A 5 mm polyp was found in the descending colon. The                            polyp was sessile. The polyp was removed with a                            cold snare. Resection and retrieval were complete.                           Internal hemorrhoids were found. The hemorrhoids                            were small.                           The exam was otherwise without abnormality. No                            inflammatory changes noted. The colon was tortous.                             Retroflexion not performed due to small size of                            rectum.                           Biopsies for histology were taken with a cold                            forceps from the right colon and left colon for                            evaluation of microscopic colitis. Complications:            No immediate complications. Estimated blood loss:                            Minimal. Estimated Blood Loss:     Estimated blood loss was minimal.  Impression:               - Non-thrombosed external hemorrhoids found on                            perianal exam.                           - One 3 mm polyp at the ileocecal valve, removed                            with a cold biopsy forceps. Resected and retrieved.                           - One 6 mm polyp at the hepatic flexure, removed                            with a cold snare. Resected and retrieved.                           - One 5 mm polyp in the descending colon, removed                            with a cold snare. Resected and retrieved.                           - Internal hemorrhoids.                           - The examination was otherwise normal. No                            inflammatory changes                           - Retroflexion not performed due to small size of                            rectum.                           - Biopsies were taken with a cold forceps from the                            right colon and left colon for evaluation of                            microscopic colitis. Recommendation:           - Patient has a contact number available for                            emergencies. The signs and symptoms of potential                            delayed complications were discussed with  the                            patient. Return to normal activities tomorrow.                            Written discharge instructions were provided to the                            patient.                            - Resume previous diet.                           - Continue present medications.                           - No ibuprofen, naproxen, or other non-steroidal                            anti-inflammatory drugs for 2 weeks after polyp                            removal.                           - Await pathology results.                           - Repeat colonoscopy is recommended for                            surveillance. The colonoscopy date will be                            determined after pathology results from today's                            exam become available for review. Remo Lipps P. Ed Rayson MD, MD 12/07/2016 9:42:46 AM This report has been signed electronically.

## 2016-12-07 NOTE — Progress Notes (Signed)
A/ox3 pleased with MAC, report to Annette RN 

## 2016-12-07 NOTE — Patient Instructions (Signed)
YOU HAD AN ENDOSCOPIC PROCEDURE TODAY AT [facility]:   Refer to the procedure report that was given to you for any specific questions about what was found during the examination.  If the procedure report does not answer your questions, please call your gastroenterologist to clarify.  If you requested that your care partner not be given the details of your procedure findings, then the procedure report has been included in a sealed envelope for you to review at your convenience later.  YOU SHOULD EXPECT: Some feelings of bloating in the abdomen. Passage of more gas than usual.  Walking can help get rid of the air that was put into your GI tract during the procedure and reduce the bloating. If you had a lower endoscopy (such as a colonoscopy or flexible sigmoidoscopy) you may notice spotting of blood in your stool or on the toilet paper. If you underwent a bowel prep for your procedure, you may not have a normal bowel movement for a few days.  Please Note:  You might notice some irritation and congestion in your nose or some drainage.  This is from the oxygen used during your procedure.  There is no need for concern and it should clear up in a day or so.  SYMPTOMS TO REPORT IMMEDIATELY:   Following lower endoscopy (colonoscopy or flexible sigmoidoscopy):  Excessive amounts of blood in the stool  Significant tenderness or worsening of abdominal pains  Swelling of the abdomen that is new, acute  Fever of 100F or higher   Following upper endoscopy (EGD)  Vomiting of blood or coffee ground material  New chest pain or pain under the shoulder blades  Painful or persistently difficult swallowing  New shortness of breath  Fever of 100F or higher  Black, tarry-looking stools  For urgent or emergent issues, a gastroenterologist can be reached at any hour by calling [phone].   DIET:  We do recommend a small meal at first, but then you may proceed to your regular diet.  Drink  plenty of fluids but you should avoid alcoholic beverages for 24 hours.  ACTIVITY:  You should plan to take it easy for the rest of today and you should NOT DRIVE or use heavy machinery until tomorrow (because of the sedation medicines used during the test).    FOLLOW UP: Our staff will call the number listed on your records the next business day following your procedure to check on you and address any questions or concerns that you may have regarding the information given to you following your procedure. If we do not reach you, we will leave a message.  However, if you are feeling well and you are not experiencing any problems, there is no need to return our call.  We will assume that you have returned to your regular daily activities without incident.  If any biopsies were taken you will be contacted by phone or by letter within the next 1-3 weeks.  Please call us at [phone] if you have not heard about the biopsies in 3 weeks.    SIGNATURES/CONFIDENTIALITY: You and/or your care partner have signed paperwork which will be entered into your electronic medical record.  These signatures attest to the fact that that the information above on your After Visit Summary has been reviewed and is understood.  Full responsibility of the confidentiality of this discharge information lies with you and/or your care-partner.    Handouts were given to your care partner on polyps  and hemorrhoids. No aspirin, aspirin products,  ibuprofen, naproxen, advil, motrin, aleve, or other non-steroidal anti-inflammatory drugs for 14 days after polyp removal. You may resume your other current medications today. Await biopsy results. Please call if any questions or concerns.

## 2016-12-10 ENCOUNTER — Telehealth: Payer: Self-pay | Admitting: *Deleted

## 2016-12-10 NOTE — Telephone Encounter (Signed)
  Follow up Call-  Call back number 12/07/2016  Post procedure Call Back phone  # [phone]  Permission to leave phone message Yes  Some recent data might be hidden     Patient questions:  Message left to call us if necessary.

## 2016-12-11 ENCOUNTER — Telehealth: Payer: Self-pay

## 2016-12-11 DIAGNOSIS — G501 Atypical facial pain: Secondary | ICD-10-CM | POA: Diagnosis not present

## 2016-12-11 NOTE — Telephone Encounter (Signed)
  Follow up Call-  Call back number 12/07/2016  Post procedure Call Back phone  # [phone]  Permission to leave phone message Yes  Some recent data might be hidden     Patient questions:  Do you have a fever, pain , or abdominal swelling? No. Pain Score  0 *  Have you tolerated food without any problems? Yes.    Have you been able to return to your normal activities? Yes.    Do you have any questions about your discharge instructions: Diet   No. Medications  No. Follow up visit  No.  Do you have questions or concerns about your Care? No.  Actions: * If pain score is 4 or above: No action needed, pain <4.

## 2016-12-11 NOTE — Telephone Encounter (Signed)
  Follow up Call-  Call back number 12/07/2016  Post procedure Call Back phone  # [phone]  Permission to leave phone message Yes  Some recent data might be hidden     Patient questions:  Do you have a fever, pain , or abdominal swelling? No. Pain Score  0 *  Have you tolerated food without any problems? Yes.    Have you been able to return to your normal activities? Yes.    Do you have any questions about your discharge instructions: Diet   No. Medications  No. Follow up visit  No.  Do you have questions or concerns about your Care? No.  Actions: * If pain score is 4 or above: No action needed, pain <4.

## 2016-12-17 ENCOUNTER — Encounter: Payer: Self-pay | Admitting: Gastroenterology

## 2017-01-06 DIAGNOSIS — J069 Acute upper respiratory infection, unspecified: Secondary | ICD-10-CM | POA: Diagnosis not present

## 2017-01-06 DIAGNOSIS — R05 Cough: Secondary | ICD-10-CM | POA: Diagnosis not present

## 2017-01-14 ENCOUNTER — Ambulatory Visit (INDEPENDENT_AMBULATORY_CARE_PROVIDER_SITE_OTHER): Payer: Medicare Other | Admitting: Family Medicine

## 2017-01-14 ENCOUNTER — Encounter: Payer: Self-pay | Admitting: Family Medicine

## 2017-01-14 VITALS — BP 111/77 | HR 89 | Temp 97.8°F | Wt 190.0 lb

## 2017-01-14 DIAGNOSIS — I10 Essential (primary) hypertension: Secondary | ICD-10-CM | POA: Diagnosis not present

## 2017-01-14 DIAGNOSIS — J0100 Acute maxillary sinusitis, unspecified: Secondary | ICD-10-CM | POA: Diagnosis not present

## 2017-01-14 DIAGNOSIS — E7800 Pure hypercholesterolemia, unspecified: Secondary | ICD-10-CM | POA: Diagnosis not present

## 2017-01-14 MED ORDER — CEFDINIR 300 MG PO CAPS
300.0000 mg | ORAL_CAPSULE | Freq: Two times a day (BID) | ORAL | 0 refills | Status: DC
Start: 2017-01-14 — End: 2017-03-19

## 2017-01-14 NOTE — Progress Notes (Signed)
Subjective:    CC: HTN  HPI:  Hypertension- Pt denies chest pain, SOB, dizziness, or heart palpitations.  Taking meds as directed w/o problems.  Denies medication side effects.    She also complains about sinus symptoms. She's had symptoms from his 2 weeks at this point in time. After week she went to urgent care. She says she completed her antibiotic. She is still using some Tessalon Perles and Flonase and says she still has some persistent symptoms. No fevers chills or sweats.  Hyperlipidemia-currently on Crestor. Tolerating well with no. S.E.   Past medical history, Surgical history, Family history not pertinant except as noted below, Social history, Allergies, and medications have been entered into the medical record, reviewed, and corrections made.   Review of Systems: No fevers, chills, night sweats, weight loss, chest pain, or shortness of breath.   Objective:    General: Well Developed, well nourished, and in no acute distress.  Neuro: Alert and oriented x3, extra-ocular muscles intact, sensation grossly intact.  HEENT: Normocephalic, atraumatic, Oropharynx is clear, TMs and canals are clear bilaterally. No significant cervical lymphadenopathy.  Skin: Warm and dry, no rashes. Cardiac: Regular rate and rhythm, no murmurs rubs or gallops, no lower extremity edema.  Respiratory: Clear to auscultation bilaterally. Not using accessory muscles, speaking in full sentences.   Impression and Recommendations:   HTN - Well controlled. Continue current regimen. Follow up in  6 mo.   Hyperlipidemia-due to recheck lipid panel.  Acute sinusitis-only minimally improved after Augmentin. Will treat with Omnicef. Call if not better in one week. Recommend a trial of over-the-counter Mucinex DM.

## 2017-01-28 ENCOUNTER — Ambulatory Visit (INDEPENDENT_AMBULATORY_CARE_PROVIDER_SITE_OTHER): Payer: Medicare Other | Admitting: Physician Assistant

## 2017-01-28 ENCOUNTER — Other Ambulatory Visit: Payer: Self-pay | Admitting: Physician Assistant

## 2017-01-28 VITALS — BP 115/78 | HR 76 | Temp 97.6°F | Wt 189.0 lb

## 2017-01-28 DIAGNOSIS — J019 Acute sinusitis, unspecified: Secondary | ICD-10-CM | POA: Diagnosis not present

## 2017-01-28 MED ORDER — IPRATROPIUM BROMIDE 0.06 % NA SOLN: 1.0000 | Freq: Four times a day (QID) | NASAL | 0 refills | Status: DC | PRN

## 2017-01-28 NOTE — Patient Instructions (Addendum)
1. Atrovent nasal spray 4 times daily  2. Allegra (fexofenadine) 180mg  daily  Instructions for nasal spray: - aim away from nasal septum - tip head back - DO NOT INHALE/SNIFF - dispense medicine and allow it to bathe the tissues for 5 seconds  Follow-up with Ear-Nose-Throat if no improvement

## 2017-01-28 NOTE — Progress Notes (Signed)
HPI:                                                                Desiree Wilson is a 51 y.o. female who presents to [place]: [place] today for a sick visit  Patient with history of trigeminal neuralgia presents with sinusitis x 4 weeks Sinusitis  This is a new problem. The current episode started 1 to 4 weeks ago. The problem has been gradually improving since onset. There has been no fever. The pain is mild. Associated symptoms include congestion, coughing (non-productive) and sinus pressure (left-sided). Pertinent negatives include no shortness of breath or sore throat. Past treatments include acetaminophen, oral decongestants and antibiotics (Augmentin, Azithromycin, Flonase, Sudafed, Tramadol).   Patient states symptoms began January 4th and she began Augmentin on January 7th and "that didn't work." She was then prescribed Omnicef on 01/14/17 and developed diarrhea. She continues to have left-sided sinus pressure and congestion and is requesting "steroids."   Past Medical History:  Diagnosis Date  . Allergy    rhinitis  . Anemia   . Blood transfusion    x2 1983 surgery blood loss  . Blood transfusion without reported diagnosis   . Depression   . Facial paralysis    right face/ chronic pain syndrome (pain management Dr Sydell Axon)  . Headache(784.0)   . Hyperlipidemia   . Hypotension   . Iron deficiency anemia 11/15/2014  . Iron malabsorption 11/15/2014  . Osteoarthritis   . Trigeminal neuralgia    right   Past Surgical History:  Procedure Laterality Date  . MANDIBLE SURGERY    . OSTEOTOMY    . 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 Smoking status: Current Some Day Smoker    Years: 6.00    Types: Cigarettes    Last attempt to quit: 12/31/2000  . Smokeless tobacco: Never Used  . Alcohol use No   family history includes  Diverticulitis in her father; Stroke in her father.  ROS: negative except as noted in the HPI  Medications: Current Outpatient Prescriptions  Medication Sig Dispense Refill  . ARIPiprazole (ABILIFY) 2 MG tablet TAKE 1 TABLET(2 MG) BY MOUTH DAILY 90 tablet 1  . aspirin-acetaminophen-caffeine (EXCEDRIN MIGRAINE) [id number] MG tablet Take by mouth.    . BELBUCA 300 MCG FILM 2 a day  2  . cefdinir (OMNICEF) 300 MG capsule Take 1 capsule (300 mg total) by mouth 2 (two) times daily. 14 capsule 0  . cholecalciferol (VITAMIN D) 1000 units tablet Take 2,000 Units by mouth daily.    . clonazePAM (KLONOPIN) 1 MG tablet Take 1 tablet (1 mg total) by mouth 2 (two) times daily. 30 tablet 2  . diclofenac sodium (VOLTAREN) 1 % GEL Apply topically 4 (four) times daily.    . diphenoxylate-atropine (LOMOTIL) 2.5-0.025 MG tablet Take 1 tablet by mouth 4 (four) times daily as needed for diarrhea or loose stools. 30 tablet 0  . drospirenone-ethinyl estradiol (VESTURA) 3-0.02 MG tablet Take 1 tablet by mouth daily. 84 tablet 3  . fluticasone (FLONASE) 50 MCG/ACT nasal spray Place into both nostrils daily.    . frovatriptan (FROVA) 2.5  MG tablet Take 2.5 mg by mouth daily.      Marland Kitchen ketamine (KETALAR) 10 MG/ML injection Inject into the skin.    . Multiple Vitamin (MULTIVITAMIN) capsule Take 1 capsule by mouth daily.    . NUCYNTA 50 MG TABS tablet Once daily  0  . omeprazole (PRILOSEC) 40 MG capsule TAKE 1 CAPSULE(40 MG) BY MOUTH DAILY 30 capsule 5  . Oxcarbazepine (TRILEPTAL) 300 MG tablet Take 300 mg by mouth 2 (two) times daily.    . Probiotic Product (PROBIOTIC ADVANCED PO) Take by mouth. 100 mg    . rosuvastatin (CRESTOR) 10 MG tablet Take 1 tablet (10 mg total) by mouth daily. 90 tablet 0  . sertraline (ZOLOFT) 100 MG tablet TAKE 1 AND 1/2 TABLETS(150 MG) BY MOUTH DAILY 135 tablet 1  . tiZANidine (ZANAFLEX) 4 MG tablet Take 4 mg by mouth every 6 (six) hours as needed.    . topiramate (TOPAMAX) 100 MG tablet  Take by mouth. Take 3 tabs once daily     . traMADol (ULTRAM) 50 MG tablet Take by mouth every 6 (six) hours as needed. 1-4 daily    . verapamil (CALAN-SR) 180 MG CR tablet Take 1 tablet (180 mg total) by mouth daily. 90 tablet 0   Current Facility-Administered Medications  Medication Dose Route Frequency Provider Last Rate Last Dose  . 0.9 %  sodium chloride infusion  500 mL Intravenous Continuous Manus Gunning, MD       Allergies  Allergen Reactions  . Azithromycin     REACTION: C Diff  . Methadone Hcl Other (See Comments)    REACTION: Hallucinations  . Phenobarbital     REACTION: Excited       Objective:  BP 115/78   Pulse 76   Temp 97.6 F (36.4 C) (Oral)   Wt 189 lb (85.7 kg)   BMI 31.45 kg/m  Gen: well-groomed, cooperative, not ill-appearing, no distress HEENT: normal conjunctiva, TM's clear, deviated nasal septum, nasal mucosa pink, oropharynx clear, moist mucus membranes, no frontal or maxillary sinus tenderness Pulm: Normal work of breathing, normal phonation, clear to auscultation bilaterally, no wheezes, rales or rhonchi CV: Normal rate, regular rhythm, s1 and s2 distinct, no murmurs, clicks or rubs  GI: soft, nondistended, nontender Neuro: alert and oriented x 3, right-sided facial droop Lymph: no cervical or tonsillar adenopathy Skin: warm and dry, no rashes or lesions on exposed skin, no cyanosis   No results found for this or any previous visit (from the past 72 hour(s)). No results found.    Assessment and Plan: 51 y.o. female with   Subacute rhinosinusitis - patient has failed 2 rounds of antibiotic treatment, which suggests this is not a bacterial sinusitis and perhaps there is an allergic component - recommended she discontinue Sudafed - add Allegra daily and Atrovent nasal spray - if no improvement in 2 weeks, follow-up with ENT - Ambulatory referral to ENT    Patient education and anticipatory guidance given Patient agrees with  treatment plan Follow-up with ENT if symptoms worsen or fail to improve  Darlyne Russian PA-C

## 2017-02-03 ENCOUNTER — Other Ambulatory Visit: Payer: Self-pay | Admitting: Family Medicine

## 2017-02-21 ENCOUNTER — Other Ambulatory Visit: Payer: Self-pay | Admitting: Family Medicine

## 2017-02-21 DIAGNOSIS — Z1239 Encounter for other screening for malignant neoplasm of breast: Secondary | ICD-10-CM

## 2017-02-24 ENCOUNTER — Other Ambulatory Visit: Payer: Self-pay | Admitting: Family Medicine

## 2017-02-27 DIAGNOSIS — G501 Atypical facial pain: Secondary | ICD-10-CM | POA: Diagnosis not present

## 2017-03-05 ENCOUNTER — Ambulatory Visit (INDEPENDENT_AMBULATORY_CARE_PROVIDER_SITE_OTHER): Payer: Medicare Other

## 2017-03-05 ENCOUNTER — Other Ambulatory Visit: Payer: Self-pay | Admitting: *Deleted

## 2017-03-05 DIAGNOSIS — Z1231 Encounter for screening mammogram for malignant neoplasm of breast: Secondary | ICD-10-CM

## 2017-03-05 DIAGNOSIS — E7800 Pure hypercholesterolemia, unspecified: Secondary | ICD-10-CM | POA: Diagnosis not present

## 2017-03-05 DIAGNOSIS — E785 Hyperlipidemia, unspecified: Secondary | ICD-10-CM

## 2017-03-05 DIAGNOSIS — Z1239 Encounter for other screening for malignant neoplasm of breast: Secondary | ICD-10-CM

## 2017-03-05 DIAGNOSIS — I10 Essential (primary) hypertension: Secondary | ICD-10-CM | POA: Diagnosis not present

## 2017-03-05 LAB — COMPLETE METABOLIC PANEL WITH GFR
ALBUMIN: 4.1 g/dL (ref 3.6–5.1)
ALK PHOS: 94 U/L (ref 33–130)
ALT: 21 U/L (ref 6–29)
AST: 22 U/L (ref 10–35)
BUN: 20 mg/dL (ref 7–25)
CALCIUM: 9.4 mg/dL (ref 8.6–10.4)
CHLORIDE: 109 mmol/L (ref 98–110)
CO2: 20 mmol/L (ref 20–31)
Creat: 1.21 mg/dL — ABNORMAL HIGH (ref 0.50–1.05)
GFR, Est African American: 60 mL/min (ref >=60)
GFR, Est Non African American: 52 mL/min — ABNORMAL LOW (ref >=60)
Glucose, Bld: 104 mg/dL — ABNORMAL HIGH (ref 65–99)
POTASSIUM: 4.2 mmol/L (ref 3.5–5.3)
SODIUM: 140 mmol/L (ref 135–146)
Total Bilirubin: 0.3 mg/dL (ref 0.2–1.2)
Total Protein: 7.3 g/dL (ref 6.1–8.1)

## 2017-03-05 LAB — LIPID PANEL
CHOL/HDL RATIO: 3.3 ratio (ref <5.0)
CHOLESTEROL: 262 mg/dL — AB (ref <200)
HDL: 79 mg/dL (ref >50)
LDL Cholesterol: 129 mg/dL — ABNORMAL HIGH (ref <100)
Triglycerides: 272 mg/dL — ABNORMAL HIGH (ref <150)
VLDL: 54 mg/dL — ABNORMAL HIGH (ref <30)

## 2017-03-19 ENCOUNTER — Ambulatory Visit (INDEPENDENT_AMBULATORY_CARE_PROVIDER_SITE_OTHER): Payer: Medicare Other | Admitting: Family Medicine

## 2017-03-19 ENCOUNTER — Encounter: Payer: Self-pay | Admitting: Family Medicine

## 2017-03-19 ENCOUNTER — Ambulatory Visit (INDEPENDENT_AMBULATORY_CARE_PROVIDER_SITE_OTHER): Payer: Medicare Other

## 2017-03-19 VITALS — BP 128/65 | HR 81 | Ht 65.0 in | Wt 192.0 lb

## 2017-03-19 DIAGNOSIS — M7022 Olecranon bursitis, left elbow: Secondary | ICD-10-CM

## 2017-03-19 DIAGNOSIS — M7989 Other specified soft tissue disorders: Secondary | ICD-10-CM | POA: Diagnosis not present

## 2017-03-19 DIAGNOSIS — M25522 Pain in left elbow: Secondary | ICD-10-CM

## 2017-03-19 MED ORDER — DROSPIRENONE-ETHINYL ESTRADIOL 3-0.02 MG PO TABS: 1.0000 | ORAL_TABLET | Freq: Every day | ORAL | 3 refills | Status: DC

## 2017-03-19 MED ORDER — OMEPRAZOLE 40 MG PO CPDR: DELAYED_RELEASE_CAPSULE | ORAL | 3 refills | Status: DC

## 2017-03-19 NOTE — Patient Instructions (Addendum)
Elbow Bursitis Elbow bursitis is inflammation of the fluid-filled sac (bursa) between the tip of your elbow bone (olecranon) and your skin. Elbow bursitis may also be called olecranon bursitis. Normally, the olecranon bursa has only a small amount of fluid in it to cushion and protect your elbow bone. Elbow bursitis causes fluid to build up inside the bursa. Over time, this swelling and inflammation can cause pain when you bend or lean on your elbow. What are the causes? Elbow bursitis may be caused by:  Elbow injury (acute trauma).  Leaning on hard surfaces for long periods of time.  Infection from an injury that breaks the skin near your elbow.  A bone growth (spur) that forms at the tip of your elbow.  A medical condition that causes inflammation in your body, such as gout or rheumatoid arthritis. The cause may also be unknown. What are the signs or symptoms? The first sign of elbow bursitis is usually swelling over the tip of your elbow. This can grow to be the size of a golf ball. This may start suddenly or develop gradually. You may also have:  Pain when bending or leaning on your elbow.  Restricted movement of your elbow. If your bursitis is caused by an infection, symptoms may also include:  Redness, warmth, and tenderness of the elbow.  Drainage of pus from the swollen area over your elbow, if the skin breaks open. How is this diagnosed? Your health care provider may be able to diagnose elbow bursitis based on your signs and symptoms, especially if you have recently been injured. Your health care provider will also do a physical exam. This may include:  X-rays to look for a bone spur or a bone fracture.  Draining fluid from the bursa to test it for infection.  Blood tests to rule out gout or rheumatoid arthritis. How is this treated? Treatment for elbow bursitis depends on the cause. Treatment may include:  Medicines. These may include:  Over-the-counter medicines to  relieve pain and inflammation.  Antibiotic medicines to fight infection.  Injections of anti-inflammatory medicines (steroids).  Wrapping your elbow with a bandage.  Draining fluid from the bursa.  Wearing elbow pads. If your bursitis does not get better with treatment, surgery may be needed to remove the bursa. Follow these instructions at home:  Take medicines only as directed by your health care provider.  If you were prescribed an antibiotic medicine, finish all of it even if you start to feel better.  If your bursitis is caused by an injury, rest your elbow and wear your bandage as directed by your health care provider. You may alsoapply ice to the injured area as directed by your health care provider:  Put ice in a plastic bag.  Place a towel between your skin and the bag.  Leave the ice on for 20 minutes, 2-3 times per day.  Avoid any activities that cause elbow pain.  Use elbow pads or elbow wraps to cushion your elbow. Contact a health care provider if:  You have a fever.  Your symptoms do not get better with treatment.  Your pain or swelling gets worse.  Your elbow pain or swelling goes away and then returns.  You have drainage of pus from the swollen area over your elbow. This information is not intended to replace advice given to you by your health care provider. Make sure you discuss any questions you have with your health care provider. Document Released: 01/16/2007 Document Revised: 05/24/2016 Document  Reviewed: 08/25/2014 Elsevier Interactive Patient Education  2017 [employer].

## 2017-03-19 NOTE — Progress Notes (Signed)
Subjective:    Patient ID: Desiree Wilson, female    DOB: 1966-03-19, 51 y.o.   MRN: [id number]  HPI L elbow pt reports that 2 wks ago after her ketamine injection she fell. She was in her bathroom and her dog came by and [place] she lost her balance and actually fell into the shower door/wall. She said she had hard enough that it actually came off the track. She said she initially was bruised along her back and her left elbow and has had a little bit of pain in her left wrist intermittently.  She stated that she has been using ice, and advil and elevating it and the pain got worse.  She says the swelling has actually gone down a little bit.   Review of Systems   BP 128/65   Pulse 81   Ht 5\' 5"  (1.651 m)   Wt 192 lb (87.1 kg)   SpO2 99%   BMI 31.95 kg/m     Allergies  Allergen Reactions  . Tapentadol Anxiety    Other reaction(s): Dizziness  . Azithromycin     REACTION: C Diff  . Methadone Hcl Other (See Comments)    REACTION: Hallucinations  . Phenobarbital     REACTION: Excited    Past Medical History:  Diagnosis Date  . Allergy    rhinitis  . Anemia   . Blood transfusion    x2 1983 surgery blood loss  . Blood transfusion without reported diagnosis   . Depression   . Facial paralysis    right face/ chronic pain syndrome (pain management Dr Sydell Axon)  . Headache(784.0)   . Hyperlipidemia   . Hypotension   . Iron deficiency anemia 11/15/2014  . Iron malabsorption 11/15/2014  . Osteoarthritis   . Trigeminal neuralgia    right    Past Surgical History:  Procedure Laterality Date  . MANDIBLE SURGERY    . OSTEOTOMY    . 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 Marital status: Single    Spouse name: N/A  . Number of children: N/A  . Years of education: N/A   Occupational History  . Not on file.   Social History Main Topics  . Smoking status: Current Some  Day Smoker    Years: 6.00    Types: Cigarettes    Last attempt to quit: 12/31/2000  . Smokeless tobacco: Never Used  . Alcohol use No  . Drug use: No  . Sexual activity: Yes    Partners: Male   Other Topics Concern  . Not on file   Social History Narrative   Water aerobics several days per week. Lives with her boyfriend.     Family History  Problem Relation Age of Onset  . Stroke Father   . Diverticulitis Father   . Colon cancer Neg Hx     Outpatient Encounter Prescriptions as of 03/19/2017  Medication Sig  . ARIPiprazole (ABILIFY) 2 MG tablet TAKE 1 TABLET(2 MG) BY MOUTH DAILY  . aspirin-acetaminophen-caffeine (EXCEDRIN MIGRAINE) 250-250-65 MG tablet Take by mouth.  . BELBUCA 300 MCG FILM 2 a day  . cholecalciferol (VITAMIN D) 1000 units tablet Take 2,000 Units by mouth daily.  . clonazePAM (KLONOPIN) 1 MG tablet Take 1 tablet (1 mg total) by mouth 2 (two) times daily.  . diclofenac sodium (VOLTAREN) 1 % GEL Apply topically 4 (four) times daily.  Marland Kitchen  diphenoxylate-atropine (LOMOTIL) 2.5-0.025 MG tablet Take 1 tablet by mouth 4 (four) times daily as needed for diarrhea or loose stools.  . drospirenone-ethinyl estradiol (VESTURA) 3-0.02 MG tablet Take 1 tablet by mouth daily.  . fluticasone (FLONASE) 50 MCG/ACT nasal spray Place into both nostrils daily.  . frovatriptan (FROVA) 2.5 MG tablet Take 2.5 mg by mouth daily.    Marland Kitchen ipratropium (ATROVENT) 0.06 % nasal spray USE 1 SPRAY IN EACH NOSTRIL FOUR TIMES DAILY AS NEEDED  . ketamine (KETALAR) 10 MG/ML injection Inject into the skin.  . Multiple Vitamin (MULTIVITAMIN) capsule Take 1 capsule by mouth daily.  . NUCYNTA 50 MG TABS tablet Once daily  . omeprazole (PRILOSEC) 40 MG capsule TAKE 1 CAPSULE(40 MG) BY MOUTH DAILY  . Oxcarbazepine (TRILEPTAL) 300 MG tablet Take 300 mg by mouth 2 (two) times daily.  . Probiotic Product (PROBIOTIC ADVANCED PO) Take by mouth. 100 mg  . rosuvastatin (CRESTOR) 10 MG tablet TAKE 1 TABLET(10 MG) BY  MOUTH DAILY  . sertraline (ZOLOFT) 100 MG tablet TAKE 1 AND 1/2 TABLETS(150 MG) BY MOUTH DAILY  . tiZANidine (ZANAFLEX) 4 MG tablet Take 4 mg by mouth every 6 (six) hours as needed.  . topiramate (TOPAMAX) 100 MG tablet Take by mouth. Take 3 tabs once daily   . traMADol (ULTRAM) 50 MG tablet Take by mouth every 6 (six) hours as needed. 1-4 daily  . verapamil (CALAN-SR) 180 MG CR tablet Take 1 tablet (180 mg total) by mouth daily.  . [DISCONTINUED] cefdinir (OMNICEF) 300 MG capsule Take 1 capsule (300 mg total) by mouth 2 (two) times daily.  . [DISCONTINUED] drospirenone-ethinyl estradiol (VESTURA) 3-0.02 MG tablet Take 1 tablet by mouth daily.  . [DISCONTINUED] omeprazole (PRILOSEC) 40 MG capsule TAKE 1 CAPSULE(40 MG) BY MOUTH DAILY   Facility-Administered Encounter Medications as of 03/19/2017  Medication  . 0.9 %  sodium chloride infusion          Objective:   Physical Exam  Constitutional: She is oriented to person, place, and time. She appears well-developed and well-nourished.  HENT:  Head: Normocephalic and atraumatic.  Eyes: Conjunctivae and EOM are normal.  Cardiovascular: Normal rate.   Pulmonary/Chest: Effort normal.  Musculoskeletal:  Left elbow with significant swelling at the tip of the olecranon. Tender with palpation. She's also a little bit tender over the lateral epicondyles. She is able to flex and extend the elbow joint. She said she had some pain in her left wrist yesterday but today it actually feels a little bit better. Nontender on exam and normal flexion and extension of the wrist. Strength is 5 out of 5 In all directions.  Neurological: She is alert and oriented to person, place, and time.  Skin: Skin is dry. No pallor.  Psychiatric: She has a normal mood and affect. Her behavior is normal.  Vitals reviewed.       Assessment & Plan:  Left olecranon bursitis-discussed diagnosis and gave reassurance. Treatment recommendation for icing and compression. Can  take anti-inflammatories if tolerated such as Advil since she has been using that already. If not improving over the next 2-3 weeks and consider aspiration of the bursa for relief. We'll also get x-ray just to rule out any type of fracture.

## 2017-03-25 ENCOUNTER — Ambulatory Visit (INDEPENDENT_AMBULATORY_CARE_PROVIDER_SITE_OTHER): Payer: Medicare Other | Admitting: Family Medicine

## 2017-03-25 ENCOUNTER — Encounter: Payer: Self-pay | Admitting: Family Medicine

## 2017-03-25 VITALS — BP 118/67 | HR 80 | Ht 65.0 in | Wt 192.0 lb

## 2017-03-25 DIAGNOSIS — M7022 Olecranon bursitis, left elbow: Secondary | ICD-10-CM | POA: Diagnosis not present

## 2017-03-25 NOTE — Patient Instructions (Signed)
Keep ACE wrap on for 2 weeks.

## 2017-03-25 NOTE — Progress Notes (Signed)
   Subjective:    Patient ID: Desiree Wilson, female    DOB: Jul 15, 1966, 51 y.o.   MRN: [id number]  HPI F/U olecranon bursitis - Patient was originally seen 6 days ago for left elbow pain that resulted is some swelling just over the tip of the elbow happen after her 8 ketamine injection where she fell and actually hit the elbow. It was actually slowly improving when I saw her last week. She then icing it using Advil and elevating it. I'm actually encouraged her to start using compression directed testing such as an Ace wrap on it. Encouraged her to continue her other anti-inflammatories as well. Do an x-ray just to rule out fracture it was negative.    Review of Systems     Objective:   Physical Exam  Constitutional: She is oriented to person, place, and time. She appears well-developed and well-nourished.  HENT:  Head: Normocephalic and atraumatic.  Eyes: Conjunctivae and EOM are normal.  Cardiovascular: Normal rate.   Pulmonary/Chest: Effort normal.  Musculoskeletal:  Left elbow with a large inflammed bursa.    Neurological: She is alert and oriented to person, place, and time.  Skin: Skin is dry. No pallor.  Psychiatric: She has a normal mood and affect. Her behavior is normal.  Vitals reviewed.       Assessment & Plan:  Olecranon bursitis - Discussed options. She would prefer to just go ahead and have it drained today. Ocean tolerated procedure well. Keep wrapped with Ace wrap until tomorrow morning if possible. She will need to wear the compression bandage for at least 2 weeks. Follow-up at that time if not significantly improved.   Aspiration/Injection Procedure Note Desiree Wilson [id number] 22-Jan-1966  Procedure: Aspiration and Injection Indications: pain and swelling over the olecranon.   Procedure Details Consent: Risks of procedure as well as the alternatives and risks of each were explained to the (patient/caregiver).  Consent for procedure obtained. Time  Out: Verified patient identification, verified procedure, site/side was marked, verified correct patient position, special equipment/implants available, medications/allergies/relevent history reviewed, required imaging and test results available.    Performed  Local Anesthesia Used:Lidocaine 1% plain; 50mlmL Amount of Fluid Aspirated: 19mL Character of Fluid: clear and straw colored  Injected 62ml lidocaine 1% and 46ml of 40mg  kenalog.     Fluid was sent for: N/A A sterile dressing was applied.  Patient did tolerate procedure well. Estimated blood loss: 0  METHENEY,CATHERINE 03/25/2017, 2:21 PM

## 2017-04-05 ENCOUNTER — Encounter: Payer: Self-pay | Admitting: Family Medicine

## 2017-04-05 ENCOUNTER — Ambulatory Visit (INDEPENDENT_AMBULATORY_CARE_PROVIDER_SITE_OTHER): Payer: Medicare Other | Admitting: Family Medicine

## 2017-04-05 VITALS — BP 119/76 | HR 97 | Ht 65.0 in | Wt 189.0 lb

## 2017-04-05 DIAGNOSIS — W540XXA Bitten by dog, initial encounter: Secondary | ICD-10-CM | POA: Diagnosis not present

## 2017-04-05 DIAGNOSIS — S61451A Open bite of right hand, initial encounter: Secondary | ICD-10-CM | POA: Diagnosis not present

## 2017-04-05 DIAGNOSIS — S61431A Puncture wound without foreign body of right hand, initial encounter: Secondary | ICD-10-CM

## 2017-04-05 MED ORDER — AMOXICILLIN-POT CLAVULANATE 875-125 MG PO TABS: 1.0000 | ORAL_TABLET | Freq: Two times a day (BID) | ORAL | 0 refills | Status: DC

## 2017-04-05 NOTE — Patient Instructions (Addendum)
Animal Bite Animal bite wounds can get infected. It is important to get proper medical treatment. Ask your doctor if you need rabies treatment. Follow these instructions at home: Wound care   Follow instructions from your doctor about how to take care of your wound. Make sure you:  Wash your hands with soap and water before you change your bandage (dressing). If you cannot use soap and water, use hand sanitizer.  Change your bandage as told by your doctor.  Leave stitches (sutures), skin glue, or skin tape (adhesive) strips in place. They may need to stay in place for 2 weeks or longer. If tape strips get loose and curl up, you may trim the loose edges. Do not remove tape strips completely unless your doctor says it is okay.  Check your wound every day for signs of infection. Watch for:  Redness, swelling, or pain that gets worse.  Fluid, blood, or pus. General instructions   Take or apply over-the-counter and prescription medicines only as told by your doctor.  If you were prescribed an antibiotic, take or apply it as told by your doctor. Do not stop using the antibiotic even if your condition improves.  Keep the injured area raised (elevated) above the level of your heart while you are sitting or lying down.  If directed, apply ice to the injured area.  Put ice in a plastic bag.  Place a towel between your skin and the bag.  Leave the ice on for 20 minutes, 2-3 times per day.  Keep all follow-up visits as told by your doctor. This is important. Contact a doctor if:  You have redness, swelling, or pain that gets worse.  You have a general feeling of sickness (malaise).  You feel sick to your stomach (nauseous).  You throw up (vomit).  You have pain that does not get better. Get help right away if:  You have a red streak going away from your wound.  You have fluid, blood, or pus coming from your wound.  You have a fever or chills.  You have trouble moving your  injured area.  You have numbness or tingling anywhere on your body. This information is not intended to replace advice given to you by your health care provider. Make sure you discuss any questions you have with your health care provider. Document Released: 12/17/2005 Document Revised: 05/24/2016 Document Reviewed: 05/04/2015 Elsevier Interactive Patient Education  2017 [employer]  

## 2017-04-05 NOTE — Progress Notes (Signed)
Subjective:    Patient ID: Desiree Wilson, female    DOB: [birth year], 51 y.o.   MRN: [id number]  HPI 51 year-old female with a dog bite on her right hand. On Wednesday evening she was walking her dose and need to adjust her harness and her dog bit her right hand.  She can get immediately with peroxide and has been using Neosporin on it. It's a little bit sore and she has some bruising but otherwise feels like it's doing well. No fevers or chills. Using Tylenol for pain.   Review of Systems   BP 119/76   Pulse 97   Ht 5\' 5"  (1.651 m)   Wt 189 lb (85.7 kg)   SpO2 98%   BMI 31.45 kg/m     Allergies  Allergen Reactions  . Tapentadol Anxiety    Other reaction(s): Dizziness  . Azithromycin     REACTION: C Diff  . Methadone Hcl Other (See Comments)    REACTION: Hallucinations  . Phenobarbital     REACTION: Excited    Past Medical History:  Diagnosis Date  . Allergy    rhinitis  . Anemia   . Blood transfusion    x2 1983 surgery blood loss  . Blood transfusion without reported diagnosis   . Depression   . Facial paralysis    right face/ chronic pain syndrome (pain management Dr Sydell Axon)  . Headache(784.0)   . Hyperlipidemia   . Hypotension   . Iron deficiency anemia 11/15/2014  . Iron malabsorption 11/15/2014  . Osteoarthritis   . Trigeminal neuralgia    right    Past Surgical History:  Procedure Laterality Date  . MANDIBLE SURGERY    . OSTEOTOMY    . 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 Marital status: Single    Spouse name: N/A  . Number of children: N/A  . Years of education: N/A   Occupational History  . Not on file.   Social History Main Topics  . Smoking status: Current Some Day Smoker    Years: 6.00    Types: Cigarettes    Last attempt to quit: 12/31/2000  . Smokeless tobacco: Never Used  . Alcohol use No  . Drug use: No  . Sexual activity:  Yes    Partners: Male   Other Topics Concern  . Not on file   Social History Narrative   Water aerobics several days per week. Lives with her boyfriend.     Family History  Problem Relation Age of Onset  . Stroke Father   . Diverticulitis Father   . Colon cancer Neg Hx     Outpatient Encounter Prescriptions as of 04/05/2017  Medication Sig  . ARIPiprazole (ABILIFY) 2 MG tablet TAKE 1 TABLET(2 MG) BY MOUTH DAILY  . aspirin-acetaminophen-caffeine (EXCEDRIN MIGRAINE) 250-250-65 MG tablet Take by mouth.  . BELBUCA 300 MCG FILM 2 a day  . cholecalciferol (VITAMIN D) 1000 units tablet Take 2,000 Units by mouth daily.  . clonazePAM (KLONOPIN) 1 MG tablet Take 1 tablet (1 mg total) by mouth 2 (two) times daily.  . diclofenac sodium (VOLTAREN) 1 % GEL Apply topically 4 (four) times daily.  . diphenoxylate-atropine (LOMOTIL) 2.5-0.025 MG tablet Take 1 tablet by mouth 4 (four) times daily as needed for diarrhea or loose stools.  . drospirenone-ethinyl estradiol (VESTURA) 3-0.02 MG tablet Take 1 tablet by mouth  daily.  . fluticasone (FLONASE) 50 MCG/ACT nasal spray Place into both nostrils daily.  . frovatriptan (FROVA) 2.5 MG tablet Take 2.5 mg by mouth daily.    Marland Kitchen ipratropium (ATROVENT) 0.06 % nasal spray USE 1 SPRAY IN EACH NOSTRIL FOUR TIMES DAILY AS NEEDED  . ketamine (KETALAR) 10 MG/ML injection Inject into the skin.  . Multiple Vitamin (MULTIVITAMIN) capsule Take 1 capsule by mouth daily.  . NUCYNTA 50 MG TABS tablet Once daily  . omeprazole (PRILOSEC) 40 MG capsule TAKE 1 CAPSULE(40 MG) BY MOUTH DAILY  . Oxcarbazepine (TRILEPTAL) 300 MG tablet Take 300 mg by mouth 2 (two) times daily.  . Probiotic Product (PROBIOTIC ADVANCED PO) Take by mouth. 100 mg  . rosuvastatin (CRESTOR) 10 MG tablet TAKE 1 TABLET(10 MG) BY MOUTH DAILY  . sertraline (ZOLOFT) 100 MG tablet TAKE 1 AND 1/2 TABLETS(150 MG) BY MOUTH DAILY  . tiZANidine (ZANAFLEX) 4 MG tablet Take 4 mg by mouth every 6 (six) hours as  needed.  . topiramate (TOPAMAX) 100 MG tablet Take by mouth. Take 3 tabs once daily   . traMADol (ULTRAM) 50 MG tablet Take by mouth every 6 (six) hours as needed. 1-4 daily  . verapamil (CALAN-SR) 180 MG CR tablet Take 1 tablet (180 mg total) by mouth daily.  Marland Kitchen amoxicillin-clavulanate (AUGMENTIN) 875-125 MG tablet Take 1 tablet by mouth 2 (two) times daily.   Facility-Administered Encounter Medications as of 04/05/2017  Medication  . 0.9 %  sodium chloride infusion         Objective:   Physical Exam  Constitutional: She is oriented to person, place, and time. She appears well-developed and well-nourished.  HENT:  Head: Normocephalic and atraumatic.  Eyes: Conjunctivae and EOM are normal.  Cardiovascular: Normal rate.   Pulmonary/Chest: Effort normal.  Musculoskeletal:  Wrists, fingers, thumb with normal range of motion and strength in all directions.  Neurological: She is alert and oriented to person, place, and time.  Skin: Skin is dry. No pallor.  She has 3 puncture wounds as seen on the right hand below. She also has some significant bruising near her right wrist, and on the dorsum of the left hand. Some increased warmth to the area but no significant surrounding erythema. She has this discomfort that radiates down to her wrist.  Psychiatric: She has a normal mood and affect. Her behavior is normal.  Vitals reviewed.             Assessment & Plan:  Dog bite-we'll give a prescription for amoxicillin to start if noticing that his increased erythema soreness redness or heat to the wound. Call if she does decide to start the antibiotic.  Commend apply Vaseline daily to keep the wounds moisturized.

## 2017-04-29 ENCOUNTER — Encounter: Payer: Self-pay | Admitting: Nurse Practitioner

## 2017-04-29 ENCOUNTER — Ambulatory Visit (INDEPENDENT_AMBULATORY_CARE_PROVIDER_SITE_OTHER): Payer: Medicare Other | Admitting: Nurse Practitioner

## 2017-04-29 ENCOUNTER — Encounter (INDEPENDENT_AMBULATORY_CARE_PROVIDER_SITE_OTHER): Payer: Self-pay

## 2017-04-29 VITALS — BP 130/70 | HR 78 | Ht 65.0 in | Wt 189.6 lb

## 2017-04-29 DIAGNOSIS — R197 Diarrhea, unspecified: Secondary | ICD-10-CM | POA: Diagnosis not present

## 2017-04-29 MED ORDER — DIPHENOXYLATE-ATROPINE 2.5-0.025 MG PO TABS: 1.0000 | ORAL_TABLET | Freq: Four times a day (QID) | ORAL | 2 refills | Status: DC | PRN

## 2017-04-29 MED ORDER — HYOSCYAMINE SULFATE 0.125 MG SL SUBL: SUBLINGUAL_TABLET | SUBLINGUAL | 2 refills | Status: DC

## 2017-04-29 NOTE — Progress Notes (Signed)
     HPI: Patient is a 51 year old female known to Dr. Havery Wilson. She had a screening colonoscopy December 2017 with removal of 3 small adenomatous colon polyps without high-grade dysplasia. Desiree Wilson comes in today for ongoing diarrhea with severe urgency which started in August. She has a lot of gas and diffuse abdominal discomfort prior to defecation. She cannot correlate symptoms with any medication changes. Typically she has diarrhea Tuesday through Wednesday and then usually one day on the weekend. In between times her stools are semi-formed, she's had no solid stools since August. It is not unusual for her to have 15-20 bowel movements a day on the days that she does have diarrhea. Occasionally she is incontinent because of an inability to get to a toilet quick enough. Other than fruits and salads, patient does not know of any trigger foods or events. Her weight is stable. The diarrhea is debilitating. It prevents her from wanting to go out of town or participate in interests outside of the home. Walking her dog can even be an issue. She does admit to an oily film in the toilet at times.    Past Medical History:  Diagnosis Date  . Allergy    rhinitis  . Anemia   . Blood transfusion    x2 1983 surgery blood loss  . Blood transfusion without reported diagnosis   . Depression   . Facial paralysis    right face/ chronic pain syndrome (pain management Dr Sydell Axon)  . Headache(784.0)   . Hyperlipidemia   . Hypotension   . Iron deficiency anemia 11/15/2014  . Iron malabsorption 11/15/2014  . Osteoarthritis   . Trigeminal neuralgia    right    Patient's surgical history, family medical history, social history, medications and allergies were all reviewed in Epic    Physical Exam: BP 130/70   Pulse 78   Ht 5\' 5"  (1.651 m)   Wt 189 lb 9.6 oz (86 kg)   BMI 31.55 kg/m   GENERAL: Well developed white female in NAD PSYCH: :Pleasant, cooperative, normal affect HEENT: Normocephalic,  conjunctiva pink, mucous membranes moist, neck supple without masses CARDIAC:  RRR, no murmur heard, no peripheral edema PULM: Normal respiratory effort, lungs CTA bilaterally, no wheezing ABDOMEN:  soft, nontender, nondistended, no obvious masses, no hepatomegaly,  normal bowel sounds SKIN:  turgor, no lesions seen Musculoskeletal:  Normal muscle tone, normal strength NEURO: Alert and oriented x 3, facial droop   ASSESSMENT and PLAN:  59.  51 year old female with debilitating diarrhea associated with severe urgency and transient diffuse lower abdominal discomfort since August.  PCP checked C. difficile in August, it was negative. Patient takes Lomotil but very sparingly. She goes upward of 15-20 times a day at least 3 days out of the week Random colon biopsies for microscopic colitis negative December 2017.  -Recheck C. Difficile.  -Stool for qualitative fat (oily film in toilet sometimes) -Low FODMAD diet -Iomotil as needed. Reassured her that it is okay to take the medication, especially when away from home -Levsin SL as needed for associated discomfort -follow up with Dr. Havery Wilson in 4-6 weeks.   2. Trigeminal neuralgia  Tye Savoy , NP 04/29/2017, 8:54 AM

## 2017-04-29 NOTE — Patient Instructions (Addendum)
If you are age 51 or older, your body mass index should be between 23-30. Your Body mass index is 31.55 kg/m. If this is out of the aforementioned range listed, please consider follow up with your Primary Care Provider.  If you are age 69 or younger, your body mass index should be between 19-25. Your Body mass index is 31.55 kg/m. If this is out of the aformentioned range listed, please consider follow up with your Primary Care Provider.   Your physician has requested that you go to the basement for the following lab work before leaving today: Fecal Fat (qual) C Diff  We have sent the following medications to your pharmacy for you to pick up at your convenience:  Lomotil Levsin  You have been given a Low Fodmap diet.  Follow up with Dr Havery Moros June 8,2018 at 68 am.  Thank you for choosing me and [facility].  Tye Savoy, NP

## 2017-04-30 NOTE — Progress Notes (Signed)
Agree with assessment and plan as outlined.  Previously tested negative for microscopic colitis. She should avoid all NSAIDs, she has taken these in the past. She also takes Zoloft which can cause diarrhea, may consider dose reduction. Otherwise continue lomotil PRN and can obtain repeat stool studies. If she has her GB in place we can consider a trial of Viberzi moving forward if symptoms persist

## 2017-05-02 ENCOUNTER — Other Ambulatory Visit: Payer: Self-pay | Admitting: Family Medicine

## 2017-06-04 DIAGNOSIS — G501 Atypical facial pain: Secondary | ICD-10-CM | POA: Diagnosis not present

## 2017-06-04 DIAGNOSIS — G894 Chronic pain syndrome: Secondary | ICD-10-CM | POA: Diagnosis not present

## 2017-06-07 ENCOUNTER — Ambulatory Visit: Payer: Medicare Other | Admitting: Gastroenterology

## 2017-06-11 DIAGNOSIS — G43719 Chronic migraine without aura, intractable, without status migrainosus: Secondary | ICD-10-CM | POA: Diagnosis not present

## 2017-06-24 ENCOUNTER — Other Ambulatory Visit: Payer: Self-pay | Admitting: Family Medicine

## 2017-06-24 DIAGNOSIS — F1190 Opioid use, unspecified, uncomplicated: Secondary | ICD-10-CM | POA: Diagnosis not present

## 2017-06-24 DIAGNOSIS — Z79899 Other long term (current) drug therapy: Secondary | ICD-10-CM | POA: Diagnosis not present

## 2017-06-24 DIAGNOSIS — G894 Chronic pain syndrome: Secondary | ICD-10-CM | POA: Diagnosis not present

## 2017-06-24 DIAGNOSIS — G501 Atypical facial pain: Secondary | ICD-10-CM | POA: Diagnosis not present

## 2017-06-24 DIAGNOSIS — Z5181 Encounter for therapeutic drug level monitoring: Secondary | ICD-10-CM | POA: Diagnosis not present

## 2017-07-03 DIAGNOSIS — L0231 Cutaneous abscess of buttock: Secondary | ICD-10-CM | POA: Diagnosis not present

## 2017-07-15 ENCOUNTER — Ambulatory Visit (INDEPENDENT_AMBULATORY_CARE_PROVIDER_SITE_OTHER): Payer: Medicare Other | Admitting: Family Medicine

## 2017-07-15 VITALS — BP 125/73 | HR 83 | Ht 65.0 in | Wt 188.0 lb

## 2017-07-15 DIAGNOSIS — F5104 Psychophysiologic insomnia: Secondary | ICD-10-CM | POA: Diagnosis not present

## 2017-07-15 DIAGNOSIS — R7309 Other abnormal glucose: Secondary | ICD-10-CM | POA: Diagnosis not present

## 2017-07-15 DIAGNOSIS — I10 Essential (primary) hypertension: Secondary | ICD-10-CM

## 2017-07-15 DIAGNOSIS — G4710 Hypersomnia, unspecified: Secondary | ICD-10-CM | POA: Diagnosis not present

## 2017-07-15 DIAGNOSIS — K529 Noninfective gastroenteritis and colitis, unspecified: Secondary | ICD-10-CM | POA: Insufficient documentation

## 2017-07-15 LAB — POCT GLYCOSYLATED HEMOGLOBIN (HGB A1C): HEMOGLOBIN A1C: 5.6

## 2017-07-15 MED ORDER — RAMELTEON 8 MG PO TABS: 8.0000 mg | ORAL_TABLET | Freq: Every day | ORAL | 1 refills | Status: DC

## 2017-07-15 MED ORDER — DIPHENOXYLATE-ATROPINE 2.5-0.025 MG PO TABS: 1.0000 | ORAL_TABLET | Freq: Four times a day (QID) | ORAL | 2 refills | Status: DC | PRN

## 2017-07-15 NOTE — Progress Notes (Signed)
Subjective:    CC: HTN  HPI:  Hypertension- Pt denies chest pain, SOB, dizziness, or heart palpitations.  Taking meds as directed w/o problems.  Denies medication side effects.    Mood - F/U depression -  Doing well overall. She is in a good supportive relationship and really doesn't have a lot of stressors on her right now. She is happy with the sertraline 150 mg total daily and was to continue with her current regimen.  She is doing better in regards to her chronic diarrhea. She said that she was started on hyoscyamine and that has really helped with the abdominal cramping. She would like a refill on the Lomotil.  Last blood sugar was 104. Last A1c was 5.5 about or years ago.  Chronic insomnia-she's still really struggling with sleep. She has tried and failed trazodone as well as Ambien.  Past medical history, Surgical history, Family history not pertinant except as noted below, Social history, Allergies, and medications have been entered into the medical record, reviewed, and corrections made.   Review of Systems: No fevers, chills, night sweats, weight loss, chest pain, or shortness of breath.   Objective:    General: Well Developed, well nourished, and in no acute distress.  Neuro: Alert and oriented x3, extra-ocular muscles intact, sensation grossly intact.  HEENT: Normocephalic, atraumatic  Skin: Warm and dry, no rashes. Cardiac: Regular rate and rhythm, no murmurs rubs or gallops, no lower extremity edema.  Respiratory: Clear to auscultation bilaterally. Not using accessory muscles, speaking in full sentences.   Impression and Recommendations:    HTN - Well controlled. Continue current regimen. Follow up in  6 months.   Depression-PHQ 9 score of 2 today which looks fantastic. Continue current regimen for now. Follow-up in 6 months.Mood questionnaire was essentially negative.  Chronic diarrhea-she's actually been doing really well with well-controlled symptoms on the  combination of Lomotil and hyoscyamine  Elevated blood glucose-we'll check A1c today.  Chronic insomnia-we'll try Rozerem for 2 weeks. If not helping consider referral to a sleep specialist.

## 2017-07-16 ENCOUNTER — Telehealth: Payer: Self-pay

## 2017-07-16 NOTE — Telephone Encounter (Signed)
PA for ROZEREM submitted via telephone call. Awaiting Decision.

## 2017-07-17 NOTE — Telephone Encounter (Signed)
PA for ROZEREM denied due to medication not meeting Non-Formulary Exception criteria requirements. Pt has only tried and failed one alternative.

## 2017-07-18 NOTE — Telephone Encounter (Signed)
Do we know which meds are covered?

## 2017-07-18 NOTE — Telephone Encounter (Signed)
Alternatives include Silenor and Temazepam.

## 2017-07-19 MED ORDER — DOXEPIN HCL 3 MG PO TABS: 3.0000 mg | ORAL_TABLET | Freq: Every day | ORAL | 1 refills | Status: DC

## 2017-07-19 NOTE — Telephone Encounter (Signed)
Please call patient and let her know that the Rozerem is not covered by her insurance and fact it was a formulary exclusion. They want her to try an alternative. So we are going to try doxepin. This does work well and so I'm hoping that she'll do great on it. New prescription sent to her pharmacy at [facility]. If it cost is high just have the pharmacist call us. Sometimes certain strengths are cheaper than others.

## 2017-07-19 NOTE — Telephone Encounter (Signed)
Patient has been notified. Desiree Wilson,CMA

## 2017-07-19 NOTE — Addendum Note (Signed)
Addended by: [name] D on: 07/19/2017 09:34 AM   Modules accepted: Orders

## 2017-07-23 ENCOUNTER — Other Ambulatory Visit: Payer: Self-pay | Admitting: Family Medicine

## 2017-07-23 NOTE — Telephone Encounter (Signed)
This med has not been filled since last Nov. Is this request appropriate? -EH/RMA

## 2017-08-07 DIAGNOSIS — G894 Chronic pain syndrome: Secondary | ICD-10-CM | POA: Diagnosis not present

## 2017-10-09 DIAGNOSIS — G894 Chronic pain syndrome: Secondary | ICD-10-CM | POA: Diagnosis not present

## 2017-10-09 DIAGNOSIS — G501 Atypical facial pain: Secondary | ICD-10-CM | POA: Diagnosis not present

## 2017-10-09 DIAGNOSIS — F1190 Opioid use, unspecified, uncomplicated: Secondary | ICD-10-CM | POA: Diagnosis not present

## 2017-10-14 ENCOUNTER — Other Ambulatory Visit: Payer: Self-pay | Admitting: Family Medicine

## 2017-10-23 DIAGNOSIS — G501 Atypical facial pain: Secondary | ICD-10-CM | POA: Diagnosis not present

## 2017-10-23 DIAGNOSIS — G894 Chronic pain syndrome: Secondary | ICD-10-CM | POA: Diagnosis not present

## 2017-10-23 DIAGNOSIS — G500 Trigeminal neuralgia: Secondary | ICD-10-CM | POA: Diagnosis not present

## 2017-11-11 ENCOUNTER — Other Ambulatory Visit: Payer: Self-pay | Admitting: Family Medicine

## 2017-12-11 DIAGNOSIS — G501 Atypical facial pain: Secondary | ICD-10-CM | POA: Diagnosis not present

## 2018-01-01 ENCOUNTER — Other Ambulatory Visit: Payer: Self-pay | Admitting: Family Medicine

## 2018-01-02 ENCOUNTER — Other Ambulatory Visit: Payer: Self-pay | Admitting: *Deleted

## 2018-01-15 ENCOUNTER — Ambulatory Visit: Payer: Medicare Other | Admitting: Family Medicine

## 2018-01-15 ENCOUNTER — Encounter: Payer: Self-pay | Admitting: Family Medicine

## 2018-01-15 VITALS — BP 124/75 | HR 83 | Ht 65.0 in | Wt 189.0 lb

## 2018-01-15 DIAGNOSIS — I10 Essential (primary) hypertension: Secondary | ICD-10-CM | POA: Diagnosis not present

## 2018-01-15 DIAGNOSIS — G501 Atypical facial pain: Secondary | ICD-10-CM | POA: Diagnosis not present

## 2018-01-15 DIAGNOSIS — M549 Dorsalgia, unspecified: Secondary | ICD-10-CM | POA: Diagnosis not present

## 2018-01-15 DIAGNOSIS — S161XXA Strain of muscle, fascia and tendon at neck level, initial encounter: Secondary | ICD-10-CM | POA: Diagnosis not present

## 2018-01-15 DIAGNOSIS — Z9889 Other specified postprocedural states: Secondary | ICD-10-CM | POA: Insufficient documentation

## 2018-01-15 DIAGNOSIS — Z23 Encounter for immunization: Secondary | ICD-10-CM | POA: Diagnosis not present

## 2018-01-15 DIAGNOSIS — F5104 Psychophysiologic insomnia: Secondary | ICD-10-CM

## 2018-01-15 DIAGNOSIS — G500 Trigeminal neuralgia: Secondary | ICD-10-CM | POA: Diagnosis not present

## 2018-01-15 MED ORDER — SUVOREXANT 10 MG PO TABS: 10.0000 mg | ORAL_TABLET | Freq: Every day | ORAL | 0 refills | Status: DC

## 2018-01-15 NOTE — Progress Notes (Signed)
Subjective:    CC:   HPI:  Hypertension- Pt denies chest pain, SOB, dizziness, or heart palpitations.  Taking meds as directed w/o problems.  Denies medication side effects.    F/u depression - she is doing really well.   Follow-up chronic insomnia-she has failed Ambien, trazodone in the past.  We decided to try Rozerem.  Unfortunately. it was not covered by her insurance so we decided to start doxepin. She feels like it makes her drowsy the next day and doesn't really work werll.   He says most nights she does not sleep at all.  She tries to go to bed around 10 PM and has a good bedtime routine.  He also complains of right upper back and right-sided neck pain for about a week and a half.  She is been doing her stretches, heating pad, ice and ibuprofen.  She is even bought some lidocaine patches and is just really not getting any significant relief.  It is also made it more difficult to sleep.  Past medical history, Surgical history, Family history not pertinant except as noted below, Social history, Allergies, and medications have been entered into the medical record, reviewed, and corrections made.   Review of Systems: No fevers, chills, night sweats, weight loss, chest pain, or shortness of breath.   Objective:    General: Well Developed, well nourished, and in no acute distress.  Neuro: Alert and oriented x3, extra-ocular muscles intact, sensation grossly intact.  HEENT: Normocephalic, atraumatic  Skin: Warm and dry, no rashes. Cardiac: Regular rate and rhythm, no murmurs rubs or gallops, no lower extremity edema.  Respiratory: Clear to auscultation bilaterally. Not using accessory muscles, speaking in full sentences.  Cervical spine with normal flexion and extension.  She is able to rotate right and left but has significant discomfort with rotation to the left.  She has some spasms in the upper trapezius muscle over the shoulder.  Nontender over the cervical spine or thoracic spine.   Shoulders with normal range of motion.  Though some pain with full extension.  Impression and Recommendations:    HTN - Well controlled. Continue current regimen. Follow up in  6 months.    Depression -stable.  Doing well.  Chronic insomnia -we will try Belsomra.  Start with 10 mg.  Coupon card provided for free 10 tabs.  If we need to go up to 15 mg after that she can give [place] a call.  Right upper back and neck pain-at this point I think she would benefit from a formal physical therapy she really is doing some very good therapy at home and is not making any improvement.

## 2018-01-22 ENCOUNTER — Encounter: Payer: Self-pay | Admitting: Physical Therapy

## 2018-01-22 ENCOUNTER — Other Ambulatory Visit: Payer: Self-pay

## 2018-01-22 ENCOUNTER — Ambulatory Visit: Payer: Medicare Other | Attending: Family Medicine | Admitting: Physical Therapy

## 2018-01-22 DIAGNOSIS — M25511 Pain in right shoulder: Secondary | ICD-10-CM

## 2018-01-22 DIAGNOSIS — M542 Cervicalgia: Secondary | ICD-10-CM | POA: Diagnosis not present

## 2018-01-22 DIAGNOSIS — R293 Abnormal posture: Secondary | ICD-10-CM

## 2018-01-22 DIAGNOSIS — M546 Pain in thoracic spine: Secondary | ICD-10-CM

## 2018-01-22 DIAGNOSIS — M6281 Muscle weakness (generalized): Secondary | ICD-10-CM | POA: Diagnosis not present

## 2018-01-22 NOTE — Patient Instructions (Signed)
Axial Extension (Chin Tuck)   Pull chin in and lengthen back of neck. Hold __5-10__ seconds while counting out loud. Repeat _10-15_ times. Do __2__ sessions per day.  Scapular Retraction (Standing)   With arms at sides, pinch shoulder blades together. Repeat __15__ times per set. Do ___2_ sets per session.   Flexibility: Upper Trapezius Stretch   Gently grasp right side of head while reaching behind back with other hand. Tilt head away until a gentle stretch is felt. Hold __30__ seconds. Repeat __3__ times per set.   Levator Scapula Stretch, Sitting   Sit, one hand tucked under hip on side to be stretched, other hand over top of head. Turn head toward other side and look down. Use hand on head to gently stretch neck in that position. Hold _30__ seconds. Repeat _3__ times per session.

## 2018-01-22 NOTE — Therapy (Signed)
[facility] [address]  [place], [place], [address] Phone: [phone]   Fax:  [phone]  Physical Therapy Evaluation  Patient Details  Name: Desiree Wilson MRN: [id number] Date of Birth: June 22, 1966 Referring Provider: Dr. [name]   Encounter Date: 01/22/2018  PT End of Session - 01/22/18 1743    Visit Number  1    Number of Visits  12    Date for PT Re-Evaluation  03/05/18    Authorization Type  [employer]    PT Start Time  1437    PT Stop Time  1512    PT Time Calculation (min)  35 min    Activity Tolerance  Patient tolerated treatment well    Behavior During Therapy  [facility] for tasks assessed/performed       Past Medical History:  Diagnosis Date  . Allergy    rhinitis  . Anemia   . Blood transfusion    x2 1983 surgery blood loss  . Blood transfusion without reported diagnosis   . Depression   . Facial paralysis    right face/ chronic pain syndrome (pain management Dr Sydell Axon)  . Headache(784.0)   . Hyperlipidemia   . Hypotension   . Iron deficiency anemia 11/15/2014  . Iron malabsorption 11/15/2014  . Osteoarthritis   . Trigeminal neuralgia    right    Past Surgical History:  Procedure Laterality Date  . MANDIBLE SURGERY    . OSTEOTOMY    . plastic facial surgery     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    There were no vitals filed for this visit.   Subjective Assessment - 01/22/18 1437    Subjective  Patient reports 76# dog - pulled patient down - reports pulled muscle in R UE and neck. Happened approx 3 weeks ago. Reports severe pain up to 10/10. Has tried many modalites, stretching, exercises, medication - nothing helps. Difficulty sleeping due to pain. 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     Pertinent History  trigeminal neuralgia, R sided facial paralysis    Diagnostic tests  none recently     Patient Stated Goals  improve pain, return to daily activities    Currently in Pain?  Yes    Pain Score  8     Pain Location  Neck and R shoulder    Pain Orientation  Right    Pain Descriptors / Indicators  Stabbing;Sharp;Constant    Pain Type  Acute pain    Pain Onset  1 to 4 weeks ago    Pain Frequency  Constant    Aggravating Factors   all movements    Pain Relieving Factors  rest, ibuprofen         [facility] PT Assessment - 01/22/18 1442      Assessment   Medical Diagnosis  upper back pain on R side; strain of cervical portion of R trap    Referring Provider  Dr. [name]    Onset Date/Surgical Date  -- ~3 weeks ago    Next MD Visit  prn    Prior Therapy  yes - not for this issue      Precautions   Precautions  None      Restrictions   Weight Bearing Restrictions  No      Balance Screen   Has the patient fallen in the past 6 months  Yes  How many times?  1    Has the patient had a decrease in activity level because of a fear of falling?   No    Is the patient reluctant to leave their home because of a fear of falling?   No      Home Film/video editor residence      Prior Function   Level of Independence  Independent    Vocation  On disability    Leisure  dog walking, comedy      Cognition   Overall Cognitive Status  Within Functional Limits for tasks assessed      Observation/Other Assessments   Focus on Therapeutic Outcomes (FOTO)   Thoracic Spine: 46 (54% limited, predicted 37% limited)      Sensation   Light Touch  Appears Intact      Coordination   Gross Motor Movements are Fluid and Coordinated  Yes      Posture/Postural Control   Posture/Postural Control  Postural limitations    Postural Limitations  Rounded Shoulders;Forward head      ROM / Strength   AROM / PROM / Strength  AROM;Strength      AROM   AROM Assessment Site  Shoulder;Cervical    Right/Left Shoulder  Right    Right Shoulder Flexion  140  Degrees    Right Shoulder ABduction  145 Degrees    Right Shoulder Internal Rotation  -- FIR to ~ sacrum    Right Shoulder External Rotation  -- FER to ~ C4    Cervical Flexion  10    Cervical Extension  44    Cervical - Right Side Bend  44    Cervical - Left Side Bend  43    Cervical - Right Rotation  53    Cervical - Left Rotation  38      Strength   Overall Strength Comments  pain at R shoulder wtih MMT    Strength Assessment Site  Shoulder    Right/Left Shoulder  Right;Left    Right Shoulder Flexion  3+/5    Right Shoulder ABduction  3+/5    Right Shoulder Internal Rotation  4-/5    Right Shoulder External Rotation  3+/5    Left Shoulder Flexion  4/5    Left Shoulder ABduction  4+/5    Left Shoulder Internal Rotation  4/5    Left Shoulder External Rotation  4/5      Palpation   Palpation comment  TTP along R sided cervical paraspinals, R UT, R LS, R rhomboids             Objective measurements completed on examination: See above findings.      [facility] PT Treatment/Exercise - 01/22/18 1442      Exercises   Exercises  Neck      Neck Exercises: Seated   Other Seated Exercise  scap retraction 10 x 5 sec holds      Neck Exercises: Supine   Neck Retraction  10 reps;5 secs      Neck Exercises: Stretches   Upper Trapezius Stretch  Right;1 rep;30 seconds    Levator Stretch  Right;1 rep;30 seconds             PT Education - 01/22/18 1736    Education provided  Yes    Education Details  exam findings, POC, HEP    Person(s) Educated  Patient    Methods  Explanation;Demonstration;Handout    Comprehension  Verbalized understanding;Returned demonstration       PT Short Term Goals - 01/22/18 1748      PT SHORT TERM GOAL #1   Title  patient to be independent with initial HEP    Status  New    Target Date  02/12/18        PT Long Term Goals - 01/22/18 1749      PT LONG TERM GOAL #1   Title  patient to be independent with advanced HEP     Target Date  03/05/18      PT LONG TERM GOAL #2   Title  patient to demonstrate R shoulder and cervical AROM to WNL and symmetrical without increased pain    Status  New    Target Date  03/05/18      PT LONG TERM GOAL #3   Title  patient to improve R shoulder strength to >/= 4+/5 wihtout pain limiting    Status  New    Target Date  03/05/18      PT LONG TERM GOAL #4   Title  patient to report ability to perform ADLs, household chores, and comedy requirements with pain no greater than 2/10    Status  New    Target Date  03/05/18             Plan - 01/22/18 1744    Clinical Impression Statement  Desiree Wilson is a pleasant 52 y/o female presenting to [facility] today regarding primary complaints of R sided neck and shoulder pain after a fall from holding onto dog leash. Patient today with limtied AROM at R shoulder and cervical spine with pain noted with all motions. Noted strength deficits as well at R shoulder with some pain provocation. Patient TTP along cervical and upper back musculature with noted trigger points and taut bands likely contributing to pain symptoms. Patient given initial HEP today for gentle stretching and strengthening with good tolerance and carryover. patient to benefit from skilled PT servicies to address the above listed deficits to allow for improved mobility and QOL.     Clinical Presentation  Stable    Clinical Decision Making  Low    Rehab Potential  Good    PT Frequency  2x / week    PT Duration  6 weeks    PT Treatment/Interventions  ADLs/Self Care Home Management;Cryotherapy;Electrical Stimulation;Iontophoresis 4mg /ml Dexamethasone;Moist Heat;Traction;Therapeutic exercise;Therapeutic activities;Ultrasound;Neuromuscular re-education;Patient/family education;Manual techniques;Vasopneumatic Device;Taping;Dry needling;Passive range of motion    Consulted and Agree with Plan of Care  Patient       Patient will benefit from skilled therapeutic intervention in order to  improve the following deficits and impairments:  Pain, Impaired UE functional use, Decreased strength, Decreased activity tolerance  Visit Diagnosis: Cervicalgia  Pain in thoracic spine  Acute pain of right shoulder  Abnormal posture  Muscle weakness (generalized)     Problem List Patient Active Problem List   Diagnosis Date Noted  . Chronic diarrhea 07/15/2017  . Deafness in right ear 07/12/2016  . Gastroesophageal reflux disease with esophagitis 06/11/2016  . Vitamin D deficiency 06/06/2016  . Myofascial pain 02/29/2016  . Chronic insomnia 12/16/2014  . Iron deficiency anemia 11/15/2014  . Iron malabsorption 11/15/2014  . CKD (chronic kidney disease) stage 3, GFR 30-59 ml/min (HCC) 08/26/2014  . Trigeminal neuralgia 07/22/2012  . Essential hypertension, benign 04/13/2011  . ANKLE PAIN, LEFT 03/06/2011  . FATIGUE 08/10/2010  . HYPERSOMNIA 08/09/2009  . Hyperlipidemia 07/05/2009  . Depression 07/05/2009  . ALLERGIC RHINITIS 07/05/2009  .  OSTEOARTHRITIS 07/05/2009  . HEADACHE 07/05/2009     Lanney Gins, PT, DPT 01/22/18 5:52 PM   [facility] [address]  [place], [place], [address] Phone: [phone]   Fax:  [phone]  Name: Desiree Wilson MRN: [id number] Date of Birth: June 22, 1966

## 2018-01-24 ENCOUNTER — Ambulatory Visit: Payer: Medicare Other | Admitting: Physical Therapy

## 2018-01-24 ENCOUNTER — Encounter: Payer: Self-pay | Admitting: Physical Therapy

## 2018-01-24 DIAGNOSIS — M542 Cervicalgia: Secondary | ICD-10-CM | POA: Diagnosis not present

## 2018-01-24 DIAGNOSIS — R293 Abnormal posture: Secondary | ICD-10-CM

## 2018-01-24 DIAGNOSIS — M25511 Pain in right shoulder: Secondary | ICD-10-CM

## 2018-01-24 DIAGNOSIS — M546 Pain in thoracic spine: Secondary | ICD-10-CM

## 2018-01-24 DIAGNOSIS — M6281 Muscle weakness (generalized): Secondary | ICD-10-CM | POA: Diagnosis not present

## 2018-01-24 NOTE — Patient Instructions (Signed)
Resistive Band Rowing    With resistive band anchored in door, grasp both ends. Keeping elbows bent, pull back, squeezing shoulder blades together. Hold __5__ seconds. Repeat _10-15___ times. Do __2__ sessions per day.

## 2018-01-24 NOTE — Therapy (Signed)
[facility] [address]  [place], [place], [address] Phone: [phone]   Fax:  [phone]  Physical Therapy Treatment  Patient Details  Name: Desiree Wilson MRN: [id number] Date of Birth: 06/25/1966 Referring Provider: Dr. [name]   Encounter Date: 01/24/2018  PT End of Session - 01/24/18 1020    Visit Number  2    Number of Visits  12    Date for PT Re-Evaluation  03/05/18    Authorization Type  [employer]    PT Start Time  1016    PT Stop Time  1056    PT Time Calculation (min)  40 min    Activity Tolerance  Patient tolerated treatment well    Behavior During Therapy  [facility] for tasks assessed/performed       Past Medical History:  Diagnosis Date  . Allergy    rhinitis  . Anemia   . Blood transfusion    x2 1983 surgery blood loss  . Blood transfusion without reported diagnosis   . Depression   . Facial paralysis    right face/ chronic pain syndrome (pain management Dr Sydell Axon)  . Headache(784.0)   . Hyperlipidemia   . Hypotension   . Iron deficiency anemia 11/15/2014  . Iron malabsorption 11/15/2014  . Osteoarthritis   . Trigeminal neuralgia    right    Past Surgical History:  Procedure Laterality Date  . MANDIBLE SURGERY    . OSTEOTOMY    . plastic facial surgery     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    There were no vitals filed for this visit.  Subjective Assessment - 01/24/18 1018    Subjective  Has improve show this weekend. had some pain relief with exercises    Pertinent History  trigeminal neuralgia, R sided facial paralysis    Diagnostic tests  none recently    Patient Stated Goals  improve pain, return to daily activities    Currently in Pain?  Yes    Pain Score  7     Pain Location  Neck and R shoulder/mid back    Pain Orientation  Right    Pain Descriptors / Indicators  Aching;Constant;Sharp    Pain Type  Acute pain                       [facility] Adult PT Treatment/Exercise - 01/24/18 1021      Neck Exercises: Machines for Strengthening   UBE (Upper Arm Bike)  L1 x 6 min (3/3)      Neck Exercises: Theraband   Shoulder Extension  15 reps yellow    Rows  15 reps yellow    Shoulder External Rotation  10 reps yellow - hooklying    Horizontal ADduction  10 reps yellow - hooklying      Manual Therapy   Manual Therapy  Soft tissue mobilization    Manual therapy comments  patient hooklying    Soft tissue mobilization  STM to B cervical paraspinals, R UT, R LS, R rhomboids, R lats/teres group      Neck Exercises: Stretches   Upper Trapezius Stretch  Right;2 reps;30 seconds               PT Short Term Goals - 01/24/18 1020      PT SHORT TERM GOAL #1   Title  patient to be independent with initial HEP  Status  On-going        PT Long Term Goals - 01/24/18 1020      PT LONG TERM GOAL #1   Title  patient to be independent with advanced HEP    Status  On-going      PT LONG TERM GOAL #2   Title  patient to demonstrate R shoulder and cervical AROM to WNL and symmetrical without increased pain    Status  On-going      PT LONG TERM GOAL #3   Title  patient to improve R shoulder strength to >/= 4+/5 wihtout pain limiting    Status  On-going      PT LONG TERM GOAL #4   Title  patient to report ability to perform ADLs, household chores, and comedy requirements with pain no greater than 2/10    Status  On-going            Plan - 01/24/18 1020    Clinical Impression Statement  [name] doing well - slight decrease in pain. Reported good compliance with HEP thus far with reports of feeling better after performing. STM and gentle periscpaular strenghtening today with good tolerance. did require 1 seated rest break due to dizziness of which she relates to her hearing loss.     PT Treatment/Interventions  ADLs/Self Care Home Management;Cryotherapy;Electrical  Stimulation;Iontophoresis 4mg /ml Dexamethasone;Moist Heat;Traction;Therapeutic exercise;Therapeutic activities;Ultrasound;Neuromuscular re-education;Patient/family education;Manual techniques;Vasopneumatic Device;Taping;Dry needling;Passive range of motion    Consulted and Agree with Plan of Care  Patient       Patient will benefit from skilled therapeutic intervention in order to improve the following deficits and impairments:  Pain, Impaired UE functional use, Decreased strength, Decreased activity tolerance  Visit Diagnosis: Cervicalgia  Pain in thoracic spine  Acute pain of right shoulder  Abnormal posture  Muscle weakness (generalized)     Problem List Patient Active Problem List   Diagnosis Date Noted  . Chronic diarrhea 07/15/2017  . Deafness in right ear 07/12/2016  . Gastroesophageal reflux disease with esophagitis 06/11/2016  . Vitamin D deficiency 06/06/2016  . Myofascial pain 02/29/2016  . Chronic insomnia 12/16/2014  . Iron deficiency anemia 11/15/2014  . Iron malabsorption 11/15/2014  . CKD (chronic kidney disease) stage 3, GFR 30-59 ml/min (HCC) 08/26/2014  . Trigeminal neuralgia 07/22/2012  . Essential hypertension, benign 04/13/2011  . ANKLE PAIN, LEFT 03/06/2011  . FATIGUE 08/10/2010  . HYPERSOMNIA 08/09/2009  . Hyperlipidemia 07/05/2009  . Depression 07/05/2009  . ALLERGIC RHINITIS 07/05/2009  . OSTEOARTHRITIS 07/05/2009  . HEADACHE 07/05/2009    Lanney Gins, PT, DPT 01/24/18 11:45 AM   [facility] [address]  [place], [place], [address] Phone: [phone]   Fax:  [phone]  Name: Desiree Wilson MRN: [id number] Date of Birth: 04-27-1966

## 2018-01-27 ENCOUNTER — Telehealth: Payer: Self-pay | Admitting: Family Medicine

## 2018-01-27 MED ORDER — SUVOREXANT 10 MG PO TABS: 10.0000 mg | ORAL_TABLET | Freq: Every day | ORAL | 0 refills | Status: DC

## 2018-01-27 NOTE — Telephone Encounter (Signed)
Pt called clinic stating the Belsomra 10mg  is working well and would like to stay on this dose. She reports getting about 5 hours of sleep at night. Needs Rx. Will route.

## 2018-01-27 NOTE — Telephone Encounter (Signed)
Pt advised.

## 2018-01-27 NOTE — Telephone Encounter (Signed)
Sent to mail order 90 days

## 2018-01-28 ENCOUNTER — Encounter: Payer: Self-pay | Admitting: Physical Therapy

## 2018-01-28 ENCOUNTER — Ambulatory Visit: Payer: Medicare Other | Admitting: Physical Therapy

## 2018-01-28 DIAGNOSIS — M546 Pain in thoracic spine: Secondary | ICD-10-CM

## 2018-01-28 DIAGNOSIS — M25511 Pain in right shoulder: Secondary | ICD-10-CM

## 2018-01-28 DIAGNOSIS — M542 Cervicalgia: Secondary | ICD-10-CM | POA: Diagnosis not present

## 2018-01-28 DIAGNOSIS — R293 Abnormal posture: Secondary | ICD-10-CM | POA: Diagnosis not present

## 2018-01-28 DIAGNOSIS — M6281 Muscle weakness (generalized): Secondary | ICD-10-CM | POA: Diagnosis not present

## 2018-01-28 NOTE — Therapy (Signed)
[facility] [address]  [place], [place], [address] Phone: [phone]   Fax:  [phone]  Physical Therapy Treatment  Patient Details  Name: Desiree Wilson MRN: [id number] Date of Birth: 1966-08-29 Referring Provider: Dr. [name]   Encounter Date: 01/28/2018  PT End of Session - 01/28/18 1146    Visit Number  3    Number of Visits  12    Date for PT Re-Evaluation  03/05/18    Authorization Type  [employer]    PT Start Time  1018    PT Stop Time  1108    PT Time Calculation (min)  50 min    Activity Tolerance  Patient tolerated treatment well    Behavior During Therapy  [facility] for tasks assessed/performed       Past Medical History:  Diagnosis Date  . Allergy    rhinitis  . Anemia   . Blood transfusion    x2 1983 surgery blood loss  . Blood transfusion without reported diagnosis   . Depression   . Facial paralysis    right face/ chronic pain syndrome (pain management Dr Sydell Axon)  . Headache(784.0)   . Hyperlipidemia   . Hypotension   . Iron deficiency anemia 11/15/2014  . Iron malabsorption 11/15/2014  . Osteoarthritis   . Trigeminal neuralgia    right    Past Surgical History:  Procedure Laterality Date  . MANDIBLE SURGERY    . OSTEOTOMY    . plastic facial surgery     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    There were no vitals filed for this visit.  Subjective Assessment - 01/28/18 1021    Subjective  had some anxiety last night + muscle spasms    Pertinent History  trigeminal neuralgia, R sided facial paralysis    Diagnostic tests  none recently    Patient Stated Goals  improve pain, return to daily activities    Currently in Pain?  Yes    Pain Score  8     Pain Location  Neck    Pain Orientation  Right    Pain Descriptors / Indicators  Aching;Sore;Discomfort                      [facility] Adult PT Treatment/Exercise -  01/28/18 0001      Neck Exercises: Machines for Strengthening   UBE (Upper Arm Bike)  L2 x 6 min (3/3)      Neck Exercises: Theraband   Rows  15 reps;Red      Modalities   Modalities  Moist Heat      Moist Heat Therapy   Number Minutes Moist Heat  10 Minutes    Moist Heat Location  Cervical      Manual Therapy   Manual Therapy  Joint mobilization;Soft tissue mobilization;Myofascial release    Manual therapy comments  patient prone    Joint Mobilization  gentle CPAs  grade II-III) of lower cervical/upper thoracic spine 3 x 15 sec each    Soft tissue mobilization  STM to B UT, B cervical paraspinals, R infraspinatus, R thoracic paraspinals, R LS, R Rhomboids    Myofascial Release  manual trigger point release to R Rhomboid + R LS       Trigger Point Dry Needling - 01/28/18 1143    Consent Given?  Yes    Education Handout Provided  Yes    Muscles  Treated Upper Body  Upper trapezius;Infraspinatus R side only    Upper Trapezius Response  Twitch reponse elicited;Palpable increased muscle length    Infraspinatus Response  Twitch response elicited;Palpable increased muscle length             PT Short Term Goals - 01/24/18 1020      PT SHORT TERM GOAL #1   Title  patient to be independent with initial HEP    Status  On-going        PT Long Term Goals - 01/24/18 1020      PT LONG TERM GOAL #1   Title  patient to be independent with advanced HEP    Status  On-going      PT LONG TERM GOAL #2   Title  patient to demonstrate R shoulder and cervical AROM to WNL and symmetrical without increased pain    Status  On-going      PT LONG TERM GOAL #3   Title  patient to improve R shoulder strength to >/= 4+/5 wihtout pain limiting    Status  On-going      PT LONG TERM GOAL #4   Title  patient to report ability to perform ADLs, household chores, and comedy requirements with pain no greater than 2/10    Status  On-going            Plan - 01/28/18 1146    Clinical  Impression Statement  [name] with some increase pain today - reports high anxiety last night increasing her pain. Has been compliant with HEP with seemingly improvement in symptoms. DN today to R sided UT and infraspinatus with good twitch response noted. Will continue to progress towards goals and improvements in pain.     PT Treatment/Interventions  ADLs/Self Care Home Management;Cryotherapy;Electrical Stimulation;Iontophoresis 4mg /ml Dexamethasone;Moist Heat;Traction;Therapeutic exercise;Therapeutic activities;Ultrasound;Neuromuscular re-education;Patient/family education;Manual techniques;Vasopneumatic Device;Taping;Dry needling;Passive range of motion    Consulted and Agree with Plan of Care  Patient       Patient will benefit from skilled therapeutic intervention in order to improve the following deficits and impairments:  Pain, Impaired UE functional use, Decreased strength, Decreased activity tolerance  Visit Diagnosis: Cervicalgia  Pain in thoracic spine  Acute pain of right shoulder  Abnormal posture  Muscle weakness (generalized)     Problem List Patient Active Problem List   Diagnosis Date Noted  . Chronic diarrhea 07/15/2017  . Deafness in right ear 07/12/2016  . Gastroesophageal reflux disease with esophagitis 06/11/2016  . Vitamin D deficiency 06/06/2016  . Myofascial pain 02/29/2016  . Chronic insomnia 12/16/2014  . Iron deficiency anemia 11/15/2014  . Iron malabsorption 11/15/2014  . CKD (chronic kidney disease) stage 3, GFR 30-59 ml/min (HCC) 08/26/2014  . Trigeminal neuralgia 07/22/2012  . Essential hypertension, benign 04/13/2011  . ANKLE PAIN, LEFT 03/06/2011  . FATIGUE 08/10/2010  . HYPERSOMNIA 08/09/2009  . Hyperlipidemia 07/05/2009  . Depression 07/05/2009  . ALLERGIC RHINITIS 07/05/2009  . OSTEOARTHRITIS 07/05/2009  . HEADACHE 07/05/2009     Lanney Gins, PT, DPT 01/28/18 11:49 AM   [facility] [address]  [place], [place], [address] Phone: [phone]   Fax:  [phone]  Name: Desiree Wilson MRN: [id number] Date of Birth: 03-10-1966

## 2018-01-29 ENCOUNTER — Other Ambulatory Visit: Payer: Self-pay | Admitting: *Deleted

## 2018-01-29 ENCOUNTER — Other Ambulatory Visit: Payer: Self-pay | Admitting: Family Medicine

## 2018-01-29 DIAGNOSIS — F5104 Psychophysiologic insomnia: Secondary | ICD-10-CM

## 2018-01-29 MED ORDER — SUVOREXANT 10 MG PO TABS: 10.0000 mg | ORAL_TABLET | Freq: Every day | ORAL | 0 refills | Status: DC

## 2018-01-30 ENCOUNTER — Ambulatory Visit: Payer: Medicare Other | Admitting: Physical Therapy

## 2018-01-30 ENCOUNTER — Encounter: Payer: Self-pay | Admitting: Physical Therapy

## 2018-01-30 DIAGNOSIS — M25511 Pain in right shoulder: Secondary | ICD-10-CM

## 2018-01-30 DIAGNOSIS — M6281 Muscle weakness (generalized): Secondary | ICD-10-CM

## 2018-01-30 DIAGNOSIS — M546 Pain in thoracic spine: Secondary | ICD-10-CM | POA: Diagnosis not present

## 2018-01-30 DIAGNOSIS — R293 Abnormal posture: Secondary | ICD-10-CM | POA: Diagnosis not present

## 2018-01-30 DIAGNOSIS — M542 Cervicalgia: Secondary | ICD-10-CM | POA: Diagnosis not present

## 2018-01-30 NOTE — Therapy (Signed)
[facility] [address]  [place], [place], [address] Phone: [phone]   Fax:  [phone]  Physical Therapy Treatment  Patient Details  Name: Desiree Wilson MRN: [id number] Date of Birth: 06-20-1966 Referring Provider: Dr. [name]   Encounter Date: 01/30/2018  PT End of Session - 01/30/18 1023    Visit Number  4    Number of Visits  12    Date for PT Re-Evaluation  03/05/18    Authorization Type  [employer]    PT Start Time  1019    PT Stop Time  1057    PT Time Calculation (min)  38 min    Activity Tolerance  Patient tolerated treatment well    Behavior During Therapy  [facility] for tasks assessed/performed       Past Medical History:  Diagnosis Date  . Allergy    rhinitis  . Anemia   . Blood transfusion    x2 1983 surgery blood loss  . Blood transfusion without reported diagnosis   . Depression   . Facial paralysis    right face/ chronic pain syndrome (pain management Dr Sydell Axon)  . Headache(784.0)   . Hyperlipidemia   . Hypotension   . Iron deficiency anemia 11/15/2014  . Iron malabsorption 11/15/2014  . Osteoarthritis   . Trigeminal neuralgia    right    Past Surgical History:  Procedure Laterality Date  . MANDIBLE SURGERY    . OSTEOTOMY    . plastic facial surgery     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    There were no vitals filed for this visit.  Subjective Assessment - 01/30/18 1021    Subjective  feeling better after DN - wishes to do some more today    Pertinent History  trigeminal neuralgia, R sided facial paralysis    Diagnostic tests  none recently    Patient Stated Goals  improve pain, return to daily activities    Currently in Pain?  Yes    Pain Score  5     Pain Location  Knee and mid back    Pain Orientation  Right    Pain Descriptors / Indicators  Aching;Sore    Pain Type  Acute pain                       [facility] Adult PT Treatment/Exercise - 01/30/18 1023      Neck Exercises: Machines for Strengthening   UBE (Upper Arm Bike)  L2 x 6 min (3/3)      Neck Exercises: Standing   Wall Push Ups  15 reps    Other Standing Exercises  walking ball up wall + B UE flexion stretch x 15 reps      Manual Therapy   Manual Therapy  Joint mobilization;Soft tissue mobilization;Myofascial release    Manual therapy comments  patient prone    Joint Mobilization  Grade III-IV CPAs of upper thoracic spine - 3 x 15 reps each    Soft tissue mobilization  STM to R sided UT, LS, rhomboids, infrapsinatus, lats, and thoracic paraspinals    Myofascial Release  manual triggerpoint release to R rhomboid       Trigger Point Dry Needling - 01/30/18 1027    Consent Given?  Yes    Muscles Treated Upper Body  Infraspinatus;Longissimus;Rhomboids    Rhomboids Response  Twitch response elicited;Palpable increased muscle length  Infraspinatus Response  Twitch response elicited;Palpable increased muscle length    Longissimus Response  Twitch response elicited;Palpable increased muscle length             PT Short Term Goals - 01/24/18 1020      PT SHORT TERM GOAL #1   Title  patient to be independent with initial HEP    Status  On-going        PT Long Term Goals - 01/24/18 1020      PT LONG TERM GOAL #1   Title  patient to be independent with advanced HEP    Status  On-going      PT LONG TERM GOAL #2   Title  patient to demonstrate R shoulder and cervical AROM to WNL and symmetrical without increased pain    Status  On-going      PT LONG TERM GOAL #3   Title  patient to improve R shoulder strength to >/= 4+/5 wihtout pain limiting    Status  On-going      PT LONG TERM GOAL #4   Title  patient to report ability to perform ADLs, household chores, and comedy requirements with pain no greater than 2/10    Status  On-going            Plan - 01/30/18 [facility] with noted relief after DN last session - pain now 5/10. Patient and PT discussing continued DN and STM work to R sided thoracic musculature with good tolerance and twitch response noted. Patient tolerable to all strengthening and soft tissue work and will continue to benefit from PT to address pain and functional mobility limitations.     PT Treatment/Interventions  ADLs/Self Care Home Management;Cryotherapy;Electrical Stimulation;Iontophoresis 4mg /ml Dexamethasone;Moist Heat;Traction;Therapeutic exercise;Therapeutic activities;Ultrasound;Neuromuscular re-education;Patient/family education;Manual techniques;Vasopneumatic Device;Taping;Dry needling;Passive range of motion    Consulted and Agree with Plan of Care  Patient       Patient will benefit from skilled therapeutic intervention in order to improve the following deficits and impairments:  Pain, Impaired UE functional use, Decreased strength, Decreased activity tolerance  Visit Diagnosis: Cervicalgia  Pain in thoracic spine  Acute pain of right shoulder  Abnormal posture  Muscle weakness (generalized)     Problem List Patient Active Problem List   Diagnosis Date Noted  . Chronic diarrhea 07/15/2017  . Deafness in right ear 07/12/2016  . Gastroesophageal reflux disease with esophagitis 06/11/2016  . Vitamin D deficiency 06/06/2016  . Myofascial pain 02/29/2016  . Chronic insomnia 12/16/2014  . Iron deficiency anemia 11/15/2014  . Iron malabsorption 11/15/2014  . CKD (chronic kidney disease) stage 3, GFR 30-59 ml/min (HCC) 08/26/2014  . Trigeminal neuralgia 07/22/2012  . Essential hypertension, benign 04/13/2011  . ANKLE PAIN, LEFT 03/06/2011  . FATIGUE 08/10/2010  . HYPERSOMNIA 08/09/2009  . Hyperlipidemia 07/05/2009  . Depression 07/05/2009  . ALLERGIC RHINITIS 07/05/2009  . OSTEOARTHRITIS 07/05/2009  . HEADACHE 07/05/2009    Lanney Gins, PT, DPT 01/30/18 12:19 PM   [facility] [address]  [place], [place], [address] Phone: [phone]   Fax:  [phone]  Name: Desiree Wilson MRN: [id number] Date of Birth: [birth year]

## 2018-01-31 HISTORY — PX: OTHER SURGICAL HISTORY: SHX169

## 2018-02-04 ENCOUNTER — Ambulatory Visit: Payer: Medicare Other | Admitting: Physical Therapy

## 2018-02-07 ENCOUNTER — Encounter: Payer: Self-pay | Admitting: Physical Therapy

## 2018-02-07 ENCOUNTER — Ambulatory Visit: Payer: Medicare Other | Attending: Family Medicine | Admitting: Physical Therapy

## 2018-02-07 DIAGNOSIS — M546 Pain in thoracic spine: Secondary | ICD-10-CM | POA: Diagnosis present

## 2018-02-07 DIAGNOSIS — M542 Cervicalgia: Secondary | ICD-10-CM | POA: Diagnosis present

## 2018-02-07 DIAGNOSIS — M6281 Muscle weakness (generalized): Secondary | ICD-10-CM | POA: Diagnosis present

## 2018-02-07 DIAGNOSIS — R293 Abnormal posture: Secondary | ICD-10-CM | POA: Diagnosis present

## 2018-02-07 DIAGNOSIS — M25511 Pain in right shoulder: Secondary | ICD-10-CM | POA: Diagnosis present

## 2018-02-07 NOTE — Therapy (Signed)
Desiree Wilson [address]  [place], [place], [address] Phone: [phone]   Fax:  [phone]  Physical Therapy Treatment  Patient Details  Name: Desiree Wilson MRN: [id number] Date of Birth: 02/23/1966 Referring Provider: Dr. [name]   Encounter Date: 02/07/2018  PT End of Session - 02/07/18 1059    Visit Number  5    Number of Visits  12    Date for PT Re-Evaluation  03/05/18    Authorization Type  [employer]    PT Start Time  1055    PT Stop Time  1140    PT Time Calculation (min)  45 min    Activity Tolerance  Patient tolerated treatment well    Behavior During Therapy  [facility] for tasks assessed/performed       Past Medical History:  Diagnosis Date  . Allergy    rhinitis  . Anemia   . Blood transfusion    x2 1983 surgery blood loss  . Blood transfusion without reported diagnosis   . Depression   . Facial paralysis    right face/ chronic pain syndrome (pain management Dr Sydell Axon)  . Headache(784.0)   . Hyperlipidemia   . Hypotension   . Iron deficiency anemia 11/15/2014  . Iron malabsorption 11/15/2014  . Osteoarthritis   . Trigeminal neuralgia    right    Past Surgical History:  Procedure Laterality Date  . MANDIBLE SURGERY    . OSTEOTOMY    . plastic facial surgery     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    There were no vitals filed for this visit.  Subjective Assessment - 02/07/18 1055    Subjective  sees pain MD this coming Tuesday - questioning infusion; fell Friday - taking Advil and a few muscle relaxers - feeling better    Pertinent History  trigeminal neuralgia, R sided facial paralysis    Diagnostic tests  none recently    Patient Stated Goals  improve pain, return to daily activities    Currently in Pain?  Yes    Pain Score  1     Pain Location  Shoulder + neck/mid back    Pain Orientation  Right    Pain Descriptors / Indicators   Sore    Pain Type  Acute pain                      [facility] Adult PT Treatment/Exercise - 02/07/18 0001      Neck Exercises: Machines for Strengthening   UBE (Upper Arm Bike)  L2 x 6 min (3/3)    Cybex Row  15 reps - 15# - narrow grip      Neck Exercises: Theraband   Shoulder Extension  15 reps;Green    Rows  15 reps;Green    Shoulder External Rotation  15 reps;Green bilateral    Shoulder Internal Rotation  15 reps;Green bilateral      Neck Exercises: Standing   Other Standing Exercises  self massage to mid back with ball x 2 min      Manual Therapy   Manual Therapy  Soft tissue mobilization    Manual therapy comments  patient hooklying    Soft tissue mobilization  STM to B cervical paraspinals, B UT, B LS, B rhomboids      Neck Exercises: Stretches   Other Neck Stretches  mid doorway stretch - 3 x 30 seconds  PT Short Term Goals - 01/24/18 1020      PT SHORT TERM GOAL #1   Title  patient to be independent with initial HEP    Status  On-going        PT Long Term Goals - 01/24/18 1020      PT LONG TERM GOAL #1   Title  patient to be independent with advanced HEP    Status  On-going      PT LONG TERM GOAL #2   Title  patient to demonstrate R shoulder and cervical AROM to WNL and symmetrical without increased pain    Status  On-going      PT LONG TERM GOAL #3   Title  patient to improve R shoulder strength to >/= 4+/5 wihtout pain limiting    Status  On-going      PT LONG TERM GOAL #4   Title  patient to report ability to perform ADLs, household chores, and comedy requirements with pain no greater than 2/10    Status  On-going            Plan - 02/07/18 1059    Clinical Impression Statement  Desiree Wilson doing really well - pain reduced to 1/10 with good ability to prevent and manage pain. Patient tolerable to all strengthening work today with no issue. Patient andPT discussing 1 week break from PT to assess carryover. Will  hopefully d/c at next visit.     PT Treatment/Interventions  ADLs/Self Care Home Management;Cryotherapy;Electrical Stimulation;Iontophoresis 4mg /ml Dexamethasone;Moist Heat;Traction;Therapeutic exercise;Therapeutic activities;Ultrasound;Neuromuscular re-education;Patient/family education;Manual techniques;Vasopneumatic Device;Taping;Dry needling;Passive range of motion    Consulted and Agree with Plan of Care  Patient       Patient will benefit from skilled therapeutic intervention in order to improve the following deficits and impairments:  Pain, Impaired UE functional use, Decreased strength, Decreased activity tolerance  Visit Diagnosis: Cervicalgia  Pain in thoracic spine  Acute pain of right shoulder  Abnormal posture  Muscle weakness (generalized)     Problem List Patient Active Problem List   Diagnosis Date Noted  . Chronic diarrhea 07/15/2017  . Deafness in right ear 07/12/2016  . Gastroesophageal reflux disease with esophagitis 06/11/2016  . Vitamin D deficiency 06/06/2016  . Myofascial pain 02/29/2016  . Chronic insomnia 12/16/2014  . Iron deficiency anemia 11/15/2014  . Iron malabsorption 11/15/2014  . CKD (chronic kidney disease) stage 3, GFR 30-59 ml/min (HCC) 08/26/2014  . Trigeminal neuralgia 07/22/2012  . Essential hypertension, benign 04/13/2011  . ANKLE PAIN, LEFT 03/06/2011  . FATIGUE 08/10/2010  . HYPERSOMNIA 08/09/2009  . Hyperlipidemia 07/05/2009  . Depression 07/05/2009  . ALLERGIC RHINITIS 07/05/2009  . OSTEOARTHRITIS 07/05/2009  . HEADACHE 07/05/2009     Lanney Gins, PT, DPT 02/07/18 11:44 AM   [facility] [address]  [place], [place], [address] Phone: [phone]   Fax:  [phone]  Name: Desiree Wilson MRN: [id number] Date of Birth: Dec 10, 1966

## 2018-02-11 DIAGNOSIS — F1190 Opioid use, unspecified, uncomplicated: Secondary | ICD-10-CM | POA: Diagnosis not present

## 2018-02-11 DIAGNOSIS — G894 Chronic pain syndrome: Secondary | ICD-10-CM | POA: Diagnosis not present

## 2018-02-11 DIAGNOSIS — G501 Atypical facial pain: Secondary | ICD-10-CM | POA: Diagnosis not present

## 2018-02-11 DIAGNOSIS — G500 Trigeminal neuralgia: Secondary | ICD-10-CM | POA: Diagnosis not present

## 2018-02-14 ENCOUNTER — Ambulatory Visit: Payer: Medicare Other | Admitting: Physical Therapy

## 2018-02-14 ENCOUNTER — Encounter: Payer: Self-pay | Admitting: Physical Therapy

## 2018-02-14 DIAGNOSIS — M6281 Muscle weakness (generalized): Secondary | ICD-10-CM

## 2018-02-14 DIAGNOSIS — M542 Cervicalgia: Secondary | ICD-10-CM

## 2018-02-14 DIAGNOSIS — M25511 Pain in right shoulder: Secondary | ICD-10-CM

## 2018-02-14 DIAGNOSIS — R293 Abnormal posture: Secondary | ICD-10-CM

## 2018-02-14 DIAGNOSIS — M546 Pain in thoracic spine: Secondary | ICD-10-CM

## 2018-02-14 NOTE — Therapy (Addendum)
[facility] [address]  [place], [place], [address] Phone: [phone]   Fax:  [phone]  Physical Therapy Treatment  Patient Details  Name: Desiree Wilson MRN: [id number] Date of Birth: 25-Nov-1966 Referring Provider: Dr. [name]   Encounter Date: 02/14/2018  PT End of Session - 02/14/18 1014    Visit Number  6    Number of Visits  12    Date for PT Re-Evaluation  03/05/18    Authorization Type  [employer]    PT Start Time  1012    PT Stop Time  1042 d/c visit    PT Time Calculation (min)  30 min    Activity Tolerance  Patient tolerated treatment well    Behavior During Therapy  [facility] for tasks assessed/performed       Past Medical History:  Diagnosis Date  . Allergy    rhinitis  . Anemia   . Blood transfusion    x2 1983 surgery blood loss  . Blood transfusion without reported diagnosis   . Depression   . Facial paralysis    right face/ chronic pain syndrome (pain management Dr Sydell Axon)  . Headache(784.0)   . Hyperlipidemia   . Hypotension   . Iron deficiency anemia 11/15/2014  . Iron malabsorption 11/15/2014  . Osteoarthritis   . Trigeminal neuralgia    right    Past Surgical History:  Procedure Laterality Date  . MANDIBLE SURGERY    . OSTEOTOMY    . plastic facial surgery     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    There were no vitals filed for this visit.  Subjective Assessment - 02/14/18 1013    Subjective  scheduled ketamine infusion for march - gamma knife on Tuesday; otherwise cervical strain has cleared up    Pertinent History  trigeminal neuralgia, R sided facial paralysis    Diagnostic tests  none recently    Patient Stated Goals  improve pain, return to daily activities    Currently in Pain?  No/denies    Pain Score  0-No pain         [facility] PT Assessment - 02/14/18 0001      Assessment   Medical Diagnosis  upper back  pain on R side; strain of cervical portion of R trap    Referring Provider  Dr. [name]      ROM / Strength   AROM / PROM / Strength  AROM;Strength      AROM   Overall AROM   Within functional limits for tasks performed    Overall AROM Comments  B UE and C-spine symmetrical and pain free      Strength   Overall Strength Comments  B UE grossly 4+/5 with no pain                  [facility] Adult PT Treatment/Exercise - 02/14/18 0001      Neck Exercises: Theraband   Shoulder Extension  15 reps;Green    Rows  15 reps;Green      Neck Exercises: Seated   Shoulder Shrugs  10 reps    Shoulder Rolls  Backwards;Forwards;10 reps               PT Short Term Goals - 02/14/18 1014      PT SHORT TERM GOAL #1   Title  patient to be independent with initial HEP    Status  Achieved        PT Long Term Goals - 02/14/18 1014      PT LONG TERM GOAL #1   Title  patient to be independent with advanced HEP    Status  Achieved      PT LONG TERM GOAL #2   Title  patient to demonstrate R shoulder and cervical AROM to WNL and symmetrical without increased pain    Status  Achieved      PT LONG TERM GOAL #3   Title  patient to improve R shoulder strength to >/= 4+/5 wihtout pain limiting    Status  Achieved      PT LONG TERM GOAL #4   Title  patient to report ability to perform ADLs, household chores, and comedy requirements with pain no greater than 2/10    Status  Achieved            Plan - 02/14/18 1022    Clinical Impression Statement  Almyra Free has progress excellently with PT. Patient today reporting no pain with daily activities with good return of full motion and strength. Patient now meeting all goals with good review of HEP adn continued strengthening program that has been outlined. Patient and PT discussing d/c at this time as patient is capable of transitioning to independent HEP practice. Patient welcome to return to PT in the future for any other  needs.     PT Treatment/Interventions  ADLs/Self Care Home Management;Cryotherapy;Electrical Stimulation;Iontophoresis 7m/ml Dexamethasone;Moist Heat;Traction;Therapeutic exercise;Therapeutic activities;Ultrasound;Neuromuscular re-education;Patient/family education;Manual techniques;Vasopneumatic Device;Taping;Dry needling;Passive range of motion    Consulted and Agree with Plan of Care  Patient       Patient will benefit from skilled therapeutic intervention in order to improve the following deficits and impairments:  Pain, Impaired UE functional use, Decreased strength, Decreased activity tolerance  Visit Diagnosis: Cervicalgia  Pain in thoracic spine  Acute pain of right shoulder  Abnormal posture  Muscle weakness (generalized)     Problem List Patient Active Problem List   Diagnosis Date Noted  . Chronic diarrhea 07/15/2017  . Deafness in right ear 07/12/2016  . Gastroesophageal reflux disease with esophagitis 06/11/2016  . Vitamin D deficiency 06/06/2016  . Myofascial pain 02/29/2016  . Chronic insomnia 12/16/2014  . Iron deficiency anemia 11/15/2014  . Iron malabsorption 11/15/2014  . CKD (chronic kidney disease) stage 3, GFR 30-59 ml/min (HCC) 08/26/2014  . Trigeminal neuralgia 07/22/2012  . Essential hypertension, benign 04/13/2011  . ANKLE PAIN, LEFT 03/06/2011  . FATIGUE 08/10/2010  . HYPERSOMNIA 08/09/2009  . Hyperlipidemia 07/05/2009  . Depression 07/05/2009  . ALLERGIC RHINITIS 07/05/2009  . OSTEOARTHRITIS 07/05/2009  . HEADACHE 07/05/2009    SLanney Gins PT, DPT 02/14/18 12:51 PM  PHYSICAL THERAPY DISCHARGE SUMMARY  Visits from Start of Care: 6  Current functional level related to goals / functional outcomes: See above   Remaining deficits: See above   Education / Equipment: HEP  Plan: Patient agrees to discharge.  Patient goals were met. Patient is being discharged due to meeting the stated rehab goals.  ?????     SLanney Gins PT, DPT 03/11/18 9:54 AM   [facility] [place] [address]Phone: [phone]  Fax:  [phone] Name: JADALINA DOPSONMRN: [id number] of Birth: 9October 15, 1967

## 2018-02-18 DIAGNOSIS — Z510 Encounter for antineoplastic radiation therapy: Secondary | ICD-10-CM | POA: Diagnosis not present

## 2018-02-18 DIAGNOSIS — G500 Trigeminal neuralgia: Secondary | ICD-10-CM | POA: Diagnosis not present

## 2018-02-26 ENCOUNTER — Other Ambulatory Visit: Payer: Self-pay | Admitting: Family Medicine

## 2018-02-28 DIAGNOSIS — G510 Bell's palsy: Secondary | ICD-10-CM | POA: Insufficient documentation

## 2018-02-28 DIAGNOSIS — G43709 Chronic migraine without aura, not intractable, without status migrainosus: Secondary | ICD-10-CM | POA: Diagnosis not present

## 2018-02-28 DIAGNOSIS — G4440 Drug-induced headache, not elsewhere classified, not intractable: Secondary | ICD-10-CM | POA: Diagnosis not present

## 2018-02-28 DIAGNOSIS — G43009 Migraine without aura, not intractable, without status migrainosus: Secondary | ICD-10-CM | POA: Diagnosis not present

## 2018-02-28 DIAGNOSIS — F17200 Nicotine dependence, unspecified, uncomplicated: Secondary | ICD-10-CM | POA: Diagnosis not present

## 2018-03-17 ENCOUNTER — Other Ambulatory Visit: Payer: Self-pay | Admitting: Family Medicine

## 2018-03-18 DIAGNOSIS — Z5181 Encounter for therapeutic drug level monitoring: Secondary | ICD-10-CM | POA: Diagnosis not present

## 2018-03-18 DIAGNOSIS — Z79899 Other long term (current) drug therapy: Secondary | ICD-10-CM | POA: Diagnosis not present

## 2018-03-18 DIAGNOSIS — G501 Atypical facial pain: Secondary | ICD-10-CM | POA: Diagnosis not present

## 2018-03-20 ENCOUNTER — Other Ambulatory Visit: Payer: Self-pay | Admitting: Family Medicine

## 2018-03-27 ENCOUNTER — Other Ambulatory Visit: Payer: Self-pay | Admitting: Family Medicine

## 2018-04-15 DIAGNOSIS — G500 Trigeminal neuralgia: Secondary | ICD-10-CM | POA: Diagnosis not present

## 2018-04-15 DIAGNOSIS — G501 Atypical facial pain: Secondary | ICD-10-CM | POA: Diagnosis not present

## 2018-04-15 DIAGNOSIS — G43009 Migraine without aura, not intractable, without status migrainosus: Secondary | ICD-10-CM | POA: Diagnosis not present

## 2018-04-15 DIAGNOSIS — G894 Chronic pain syndrome: Secondary | ICD-10-CM | POA: Diagnosis not present

## 2018-04-21 ENCOUNTER — Ambulatory Visit: Payer: Medicare Other | Admitting: Family Medicine

## 2018-04-21 ENCOUNTER — Encounter: Payer: Self-pay | Admitting: Family Medicine

## 2018-04-21 VITALS — BP 119/78 | HR 80 | Ht 65.0 in | Wt 190.0 lb

## 2018-04-21 DIAGNOSIS — N183 Chronic kidney disease, stage 3 unspecified: Secondary | ICD-10-CM

## 2018-04-21 DIAGNOSIS — E785 Hyperlipidemia, unspecified: Secondary | ICD-10-CM

## 2018-04-21 DIAGNOSIS — Z79899 Other long term (current) drug therapy: Secondary | ICD-10-CM | POA: Diagnosis not present

## 2018-04-21 DIAGNOSIS — J3089 Other allergic rhinitis: Secondary | ICD-10-CM | POA: Diagnosis not present

## 2018-04-21 LAB — LIPID PANEL
CHOL/HDL RATIO: 5 (calc) — AB (ref <5.0)
CHOLESTEROL: 313 mg/dL — AB (ref <200)
HDL: 63 mg/dL (ref >50)
LDL CHOLESTEROL (CALC): 205 mg/dL — AB
Non-HDL Cholesterol (Calc): 250 mg/dL (calc) — ABNORMAL HIGH (ref <130)
Triglycerides: 242 mg/dL — ABNORMAL HIGH (ref <150)

## 2018-04-21 LAB — COMPLETE METABOLIC PANEL WITH GFR
AG RATIO: 1.4 (calc) (ref 1.0–2.5)
ALBUMIN MSPROF: 4.2 g/dL (ref 3.6–5.1)
ALT: 11 U/L (ref 6–29)
AST: 14 U/L (ref 10–35)
Alkaline phosphatase (APISO): 90 U/L (ref 33–130)
BUN/Creatinine Ratio: 15 (calc) (ref 6–22)
BUN: 17 mg/dL (ref 7–25)
CALCIUM: 9.3 mg/dL (ref 8.6–10.4)
CHLORIDE: 105 mmol/L (ref 98–110)
CO2: 24 mmol/L (ref 20–32)
Creat: 1.15 mg/dL — ABNORMAL HIGH (ref 0.50–1.05)
GFR, EST NON AFRICAN AMERICAN: 55 mL/min/{1.73_m2} — AB (ref >=60)
GFR, Est African American: 64 mL/min/{1.73_m2} (ref >=60)
GLOBULIN: 3 g/dL (ref 1.9–3.7)
Glucose, Bld: 100 mg/dL — ABNORMAL HIGH (ref 65–99)
Potassium: 4.1 mmol/L (ref 3.5–5.3)
SODIUM: 137 mmol/L (ref 135–146)
TOTAL PROTEIN: 7.2 g/dL (ref 6.1–8.1)
Total Bilirubin: 0.5 mg/dL (ref 0.2–1.2)

## 2018-04-21 NOTE — Progress Notes (Signed)
Subjective:    CC: Meds.   HPI:  Follow-up insomnia - pt reports that her pain Dr told her that she would need to discuss taking the klonopin and zanaflex together with her pcp for sleep.   Follow-up hyperlipidemia-reports that she is taking her Crestor regularly.  Due for repeat lipid panel.  Also complains of persistent nasal congestion and facial pain and pressure.  No fevers chills or sweats.  She is not currently taking anything such as an allergy medication.  Past medical history, Surgical history, Family history not pertinant except as noted below, Social history, Allergies, and medications have been entered into the medical record, reviewed, and corrections made.   Review of Systems: No fevers, chills, night sweats, weight loss, chest pain, or shortness of breath.   Objective:    General: Well Developed, well nourished, and in no acute distress.  Neuro: Alert and oriented x3, extra-ocular muscles intact, sensation grossly intact.  HEENT: Normocephalic, atraumatic oropharynx is clear, TMs and canals are clear bilaterally.  No significant cervical lymphadenopathy. Skin: Warm and dry, no rashes. Cardiac: Regular rate and rhythm, no murmurs rubs or gallops, no lower extremity edema.  Respiratory: Clear to auscultation bilaterally. Not using accessory muscles, speaking in full sentences.   Impression and Recommendations:    INosmia -we had a long discussion today about avoiding the use of benzodiazepines particularly when she is on chronic pain medication.  Even though she just uses it occasionally for sleep I really think it would be best that she be taken off that medication completely.  She does use the Belsomra fairly frequently but says it can cause some excess sedation the next day.  So we may end up having to find some type of alternative instead of the benzodiazepine.  She will talk with her pain management specialist first and then let me know what the plan  is.  Benzodiazepine use-see note above.  Allergic rhinitis -trial of over-the-counter Claritin.  If not helping over the next couple weeks and please let me know.  Hyperlpidemia - due to recheck lipids.

## 2018-04-21 NOTE — Patient Instructions (Signed)
Can try your Claritin daily for your sinuses. iF not better then let me know.

## 2018-04-22 ENCOUNTER — Encounter: Payer: Self-pay | Admitting: Family Medicine

## 2018-04-22 MED ORDER — ROSUVASTATIN CALCIUM 20 MG PO TABS: 20.0000 mg | ORAL_TABLET | Freq: Every day | ORAL | 3 refills | Status: DC

## 2018-05-12 ENCOUNTER — Other Ambulatory Visit: Payer: Self-pay | Admitting: Family Medicine

## 2018-05-16 ENCOUNTER — Emergency Department (HOSPITAL_BASED_OUTPATIENT_CLINIC_OR_DEPARTMENT_OTHER)
Admission: EM | Admit: 2018-05-16 | Discharge: 2018-05-17 | Disposition: A | Discharge status: Home / Self Care | Payer: Medicare Other | Attending: Emergency Medicine | Admitting: Emergency Medicine

## 2018-05-16 ENCOUNTER — Emergency Department (HOSPITAL_BASED_OUTPATIENT_CLINIC_OR_DEPARTMENT_OTHER): Payer: Medicare Other

## 2018-05-16 ENCOUNTER — Encounter (HOSPITAL_BASED_OUTPATIENT_CLINIC_OR_DEPARTMENT_OTHER): Payer: Self-pay | Admitting: *Deleted

## 2018-05-16 ENCOUNTER — Other Ambulatory Visit: Payer: Self-pay

## 2018-05-16 DIAGNOSIS — R42 Dizziness and giddiness: Secondary | ICD-10-CM | POA: Insufficient documentation

## 2018-05-16 DIAGNOSIS — Y998 Other external cause status: Secondary | ICD-10-CM | POA: Diagnosis not present

## 2018-05-16 DIAGNOSIS — Z79899 Other long term (current) drug therapy: Secondary | ICD-10-CM | POA: Insufficient documentation

## 2018-05-16 DIAGNOSIS — H538 Other visual disturbances: Secondary | ICD-10-CM | POA: Insufficient documentation

## 2018-05-16 DIAGNOSIS — R110 Nausea: Secondary | ICD-10-CM | POA: Insufficient documentation

## 2018-05-16 DIAGNOSIS — W228XXA Striking against or struck by other objects, initial encounter: Secondary | ICD-10-CM | POA: Insufficient documentation

## 2018-05-16 DIAGNOSIS — F17210 Nicotine dependence, cigarettes, uncomplicated: Secondary | ICD-10-CM | POA: Insufficient documentation

## 2018-05-16 DIAGNOSIS — Y9389 Activity, other specified: Secondary | ICD-10-CM | POA: Insufficient documentation

## 2018-05-16 DIAGNOSIS — Y92810 Car as the place of occurrence of the external cause: Secondary | ICD-10-CM | POA: Insufficient documentation

## 2018-05-16 DIAGNOSIS — S0990XA Unspecified injury of head, initial encounter: Secondary | ICD-10-CM | POA: Insufficient documentation

## 2018-05-16 DIAGNOSIS — R51 Headache: Secondary | ICD-10-CM | POA: Diagnosis not present

## 2018-05-16 MED ORDER — PROMETHAZINE HCL 25 MG/ML IJ SOLN
25.0000 mg | Freq: Once | INTRAMUSCULAR | Status: AC
  Administered 2018-05-16: 25 mg via INTRAMUSCULAR
  Filled 2018-05-16: qty 1

## 2018-05-16 NOTE — ED Notes (Signed)
ED Provider at bedside. 

## 2018-05-16 NOTE — ED Provider Notes (Signed)
[facility] Provider Note: Georgena Spurling, MD, FACEP  CSN: [id number] MRN: [id number] ARRIVAL: 05/16/18 at 2149 ROOM: [place]  Head Injury   HISTORY OF PRESENT ILLNESS  05/16/18 11:31 PM Desiree Wilson is a 52 y.o. female with a history of right facial droop and right ear deafness as well as right-sided trigeminal neuralgia due to multiple surgeries in the past.  She is here after striking her head with the hatchback of a car about 2-1/2 hours ago.  She struck the right parietal region.  She is having a headache as well as an exacerbation of her trigeminal neuralgia.  She rates her pain as a 10 out of 10 despite taking her usual narcotic pain medications.  She is also having nausea and dizziness.  She describes the dizziness as a sensation of being off balance.  She has not vomited.  She is having some blurriness in her right eye.  She has taken Excedrin without relief.   Past Medical History:  Diagnosis Date  . Allergy    rhinitis  . Anemia   . Blood transfusion    x2 1983 surgery blood loss  . Blood transfusion without reported diagnosis   . Depression   . Facial paralysis    right face/ chronic pain syndrome (pain management Dr Sydell Axon)  . Headache(784.0)   . Hyperlipidemia   . Hypotension   . Iron deficiency anemia 11/15/2014  . Iron malabsorption 11/15/2014  . Osteoarthritis   . Trigeminal neuralgia    right    Past Surgical History:  Procedure Laterality Date  . MANDIBLE SURGERY    . OSTEOTOMY    . 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 Stroke Father   . Diverticulitis Father   . Colon cancer Neg Hx     Social History   Tobacco Use  . Smoking status: Current Some Day Smoker    Years: 6.00    Types: Cigarettes    Last attempt to quit: 12/31/2000    Years since quitting: 17.3  . Smokeless tobacco: Never Used  .  Tobacco comment: tobacco infor given  Substance Use Topics  . Alcohol use: No    Alcohol/week: 0.0 oz  . Drug use: No    Prior to Admission medications   Medication Sig Start Date End Date Taking? Authorizing Provider  ARIPiprazole (ABILIFY) 2 MG tablet TAKE 1 TABLET(2 MG) BY MOUTH DAILY 02/26/18   Hali Marry, MD  aspirin-acetaminophen-caffeine (EXCEDRIN MIGRAINE) [phone] MG tablet Take by mouth.    [provider]  BELBUCA 300 MCG FILM 2 a day 08/19/15   [provider]  Botulinum Toxin Type A (BOTOX) 200 units SOLR Inject as directed. Gets 31 injections in her face/neck for migraine prevention    [provider]  cholecalciferol (VITAMIN D) 1000 units tablet Take 2,000 Units by mouth daily.    [provider]  clonazePAM (KLONOPIN) 1 MG tablet Take 1 tablet (1 mg total) by mouth 2 (two) times daily. 06/11/11   Bowen, Collene Leyden, DO  diphenoxylate-atropine (LOMOTIL) 2.5-0.025 MG tablet TAKE 1 TABLET BY MOUTH FOUR TIMES DAILY AS NEEDED FOR DIARRHEA OR LOOSE STOOLS 05/12/18   Hali Marry, MD  frovatriptan (FROVA) 2.5 MG tablet Take 2.5 mg by mouth daily as needed.     [provider]  hyoscyamine (LEVSIN  SL) 0.125 MG SL tablet Take one every 8 hours as needed 04/29/17   Willia Craze, NP  ketamine (KETALAR) 10 MG/ML injection Inject into the vein every 8 (eight) weeks.     [provider]  Lactobacillus (PROBIOTIC ACIDOPHILUS) TABS Take by mouth. 3-4 tablets daily before meals    [provider]  LORYNA 3-0.02 MG tablet TAKE 1 TABLET BY MOUTH DAILY 03/20/18   Hali Marry, MD  Multiple Vitamin (MULTIVITAMIN) capsule Take 1 capsule by mouth daily.    [provider]  NUCYNTA 50 MG TABS tablet Take 50 mg by mouth every 4 (four) hours as needed for severe pain. Once daily  05/18/15   [provider]  omeprazole (PRILOSEC) 40 MG capsule TAKE 1 CAPSULE(40 MG) BY MOUTH DAILY 03/27/18   Hali Marry, MD  Oxcarbazepine (TRILEPTAL) 300 MG tablet Take 300 mg by mouth 2 (two) times daily.    [provider]  rosuvastatin (CRESTOR) 20 MG tablet Take 1 tablet (20 mg total) by mouth daily. 04/22/18   Hali Marry, MD  sertraline (ZOLOFT) 100 MG tablet TAKE 1 AND 1/2 TABLETS(150 MG) BY MOUTH DAILY 03/17/18   Hali Marry, MD  Suvorexant (BELSOMRA) 10 MG TABS Take 10 mg by mouth at bedtime. 01/29/18   Hali Marry, MD  tiZANidine (ZANAFLEX) 4 MG tablet Take 4 mg by mouth every 6 (six) hours as needed. 01/28/15   [provider]  topiramate (TOPAMAX) 100 MG tablet Take by mouth. Take 3 tabs once daily     [provider]  verapamil (CALAN-SR) 180 MG CR tablet Take 1 tablet (180 mg total) by mouth daily. 02/02/13   Hali Marry, MD    Allergies Tapentadol; Azithromycin; Methadone hcl; and Phenobarbital   REVIEW OF SYSTEMS  Negative except as noted here or in the History of Present Illness.   PHYSICAL EXAMINATION  Initial Vital Signs Blood pressure (!) 157/82, pulse 97, temperature 98.5 F (36.9 C), temperature source Oral, resp. rate 16, height 5\' 5"  (1.651 m), weight 84.8 kg (187 lb), SpO2 98 %.  Examination General: Well-developed, well-nourished female in no acute distress; appearance consistent with age of record HENT: normocephalic; atraumatic; TMs normal Eyes: pupils equal, round and reactive to light; extraocular muscles intact; normal right funduscopic exam Neck: supple Heart: regular rate and rhythm Lungs: clear to auscultation bilaterally Abdomen: soft; nondistended; nontender; bowel sounds present Extremities: No deformity; full range of motion; pulses normal Neurologic: Awake, alert and oriented; motor function intact in all extremities and symmetric; mild right facial droop Skin: Warm and dry Psychiatric: Normal mood and affect   RESULTS  Summary of this visit's results, reviewed by myself:   EKG  Interpretation  Date/Time:    Ventricular Rate:    PR Interval:    QRS Duration:   QT Interval:    QTC Calculation:   R Axis:     Text Interpretation:        Laboratory Studies: No results found for this or any previous visit (from the past 24 hour(s)). Imaging Studies: Ct Head Wo Contrast  Result Date: 05/17/2018 CLINICAL DATA:  Patient was hit in the back of the head by a hedge back door on the right. Right eye blurriness and dizziness. No loss of consciousness. EXAM: CT HEAD WITHOUT CONTRAST TECHNIQUE: Contiguous axial images were obtained from the base of the skull through the vertex without intravenous contrast. COMPARISON:  Cervical spine radiographs from 02/07/2011 which include the  skull base. CT head report from 08/10/2010 FINDINGS: Brain: No evidence of acute infarction, hemorrhage, hydrocephalus, extra-axial collection or mass lesion/mass effect. Vascular: No hyperdense vessel or unexpected calcification. Skull: Right suboccipital craniectomy change. Single metallic bolt in the posterior right parietal skull. No acute skull fracture. Sinuses/Orbits: Mild left-sided nasal septal spur and levoconvex curvature of the nasal septum. No active paranasal or mastoid disease. Intact orbits and globes. Other: None IMPRESSION: Stable right suboccipital craniectomy change. No acute intracranial abnormality. Electronically Signed   By: Ashley Royalty M.D.   On: 05/17/2018 00:23    ED COURSE and MDM  Nursing notes and initial vitals signs, including pulse oximetry, reviewed.  Vitals:   05/16/18 2154  BP: (!) 157/82  Pulse: 97  Resp: 16  Temp: 98.5 F (36.9 C)  TempSrc: Oral  SpO2: 98%  Weight: 84.8 kg (187 lb)  Height: 5\' 5"  (1.651 m)   Patient advised of reassuring CT findings.  Symptoms consistent with mild postconcussive syndrome.  Nausea improved after IM Phenergan.  PROCEDURES    ED DIAGNOSES     ICD-10-CM   1. Minor head injury without loss of consciousness, initial  encounter S09.90XA        Champion Corales, Jenny Reichmann, MD 05/17/18 [phone]

## 2018-05-16 NOTE — ED Notes (Signed)
Patient transported to CT 

## 2018-05-16 NOTE — ED Triage Notes (Signed)
Pt reports that she was putting something in the car and the hatchback door hit her in the back of her right head. NO LOC. C/o nausea, right eye blurry and dizziness. Took 2 Excedrin PTA. Pt has facial droop she reports as baseline for her.

## 2018-05-17 MED ORDER — PROMETHAZINE HCL 25 MG PO TABS: 25.0000 mg | ORAL_TABLET | Freq: Four times a day (QID) | ORAL | 0 refills | Status: DC | PRN

## 2018-05-21 DIAGNOSIS — G501 Atypical facial pain: Secondary | ICD-10-CM | POA: Diagnosis not present

## 2018-05-21 DIAGNOSIS — G43009 Migraine without aura, not intractable, without status migrainosus: Secondary | ICD-10-CM | POA: Diagnosis not present

## 2018-06-19 DIAGNOSIS — Z923 Personal history of irradiation: Secondary | ICD-10-CM | POA: Diagnosis not present

## 2018-06-19 DIAGNOSIS — G501 Atypical facial pain: Secondary | ICD-10-CM | POA: Diagnosis not present

## 2018-07-01 ENCOUNTER — Telehealth: Payer: Self-pay | Admitting: Family Medicine

## 2018-07-01 NOTE — Telephone Encounter (Signed)
Patient calls and states she has paralysis of face on right side which is normal. But since last night at 8 she has had numbness and tingling on the right side of face and today her right hand is starting to cramp.  Spoke with Dr. Madilyn Fireman and advised patient to go to the ED. Patient agrees with plan. KG LPN

## 2018-07-31 ENCOUNTER — Other Ambulatory Visit: Payer: Self-pay | Admitting: Family Medicine

## 2018-08-15 DIAGNOSIS — Z923 Personal history of irradiation: Secondary | ICD-10-CM | POA: Diagnosis not present

## 2018-08-15 DIAGNOSIS — G501 Atypical facial pain: Secondary | ICD-10-CM | POA: Diagnosis not present

## 2018-08-21 ENCOUNTER — Ambulatory Visit: Payer: Medicare Other | Admitting: Family Medicine

## 2018-08-27 DIAGNOSIS — G501 Atypical facial pain: Secondary | ICD-10-CM | POA: Diagnosis not present

## 2018-09-25 ENCOUNTER — Other Ambulatory Visit: Payer: Self-pay | Admitting: Family Medicine

## 2018-10-02 ENCOUNTER — Ambulatory Visit (INDEPENDENT_AMBULATORY_CARE_PROVIDER_SITE_OTHER): Payer: Medicare Other

## 2018-10-02 ENCOUNTER — Ambulatory Visit (INDEPENDENT_AMBULATORY_CARE_PROVIDER_SITE_OTHER): Payer: Medicare Other | Admitting: Family Medicine

## 2018-10-02 VITALS — BP 141/86 | HR 93 | Wt 202.0 lb

## 2018-10-02 DIAGNOSIS — S62625A Displaced fracture of medial phalanx of left ring finger, initial encounter for closed fracture: Secondary | ICD-10-CM | POA: Diagnosis not present

## 2018-10-02 DIAGNOSIS — M79671 Pain in right foot: Secondary | ICD-10-CM

## 2018-10-02 DIAGNOSIS — S93491A Sprain of other ligament of right ankle, initial encounter: Secondary | ICD-10-CM

## 2018-10-02 DIAGNOSIS — S62627A Displaced fracture of medial phalanx of left little finger, initial encounter for closed fracture: Secondary | ICD-10-CM | POA: Diagnosis not present

## 2018-10-02 DIAGNOSIS — M79642 Pain in left hand: Secondary | ICD-10-CM | POA: Diagnosis not present

## 2018-10-02 DIAGNOSIS — M25571 Pain in right ankle and joints of right foot: Secondary | ICD-10-CM

## 2018-10-02 DIAGNOSIS — W19XXXA Unspecified fall, initial encounter: Secondary | ICD-10-CM | POA: Diagnosis not present

## 2018-10-02 DIAGNOSIS — S99911A Unspecified injury of right ankle, initial encounter: Secondary | ICD-10-CM | POA: Diagnosis not present

## 2018-10-02 DIAGNOSIS — S99921A Unspecified injury of right foot, initial encounter: Secondary | ICD-10-CM | POA: Diagnosis not present

## 2018-10-02 DIAGNOSIS — M7731 Calcaneal spur, right foot: Secondary | ICD-10-CM | POA: Diagnosis not present

## 2018-10-02 MED ORDER — AMBULATORY NON FORMULARY MEDICATION: 0 refills | Status: DC

## 2018-10-02 NOTE — Patient Instructions (Signed)
Thank you for coming in today. Keep the fingers buddy taped.  Use the cam walker boot.  Use a walker in addition if needed.  Recheck in 1-2 weeks.  Return sooner if needed.    How to Buddy Tape Buddy taping refers to taping an injured finger or toe to an uninjured finger or toe that is next to it. This protects the injured finger or toe and keeps it from moving while the injury heals. You may buddy tape a finger or toe if you have a minor sprain. Your health care provider may buddy tape your finger or toe if you have a sprain, dislocation, or fracture. You may be told to replace your buddy taping as needed. What are the risks? Generally, buddy taping is safe. However, problems may occur, such as:  Skin injury or infection.  Reduced blood flow to the finger or toe.  Skin reaction to the tape.  Do not buddy tape your toe if you have diabetes. Do not buddy tape if you know that you have an allergy to adhesives or surgical tape. How to buddy tape Before Buddy Taping Try to reduce any pain and swelling with rest, icing, and elevation:  Avoid any activity that causes pain.  Raise (elevate) your hand or foot above the level of your heart while you are sitting or lying down.  If directed, apply ice to the injured area: ? Put ice in a plastic bag. ? Place a towel between your skin and the bag. ? Leave the ice on for 20 minutes, 2-3 times per day.  Buddy Taping Procedure  Clean and dry your finger or toe as told by your health care provider.  Place a gauze pad or a piece of cloth or cotton between your injured finger or toe and the uninjured finger or toe.  Use tape to wrap around both fingers or toes so your injured finger or toe is secured to the uninjured finger or toe. ? The tape should be snug, but not tight. ? Make sure the ends of the piece of tape overlap. ? Avoid placing tape directly over the joint.  Change the tape and the padding as told by your health care provider.  Remove and replace the tape or padding if it becomes loose, worn, dirty, or wet. After Buddy Taping  Take over-the-counter and prescription medicines only as told by your health care provider.  Return to your normal activities as told by your health care provider. Ask your health care provider what activities are safe for you.  Watch the buddy-taped area and always remove buddy taping if: ? Your pain gets worse. ? Your fingers turn pale or blue. ? Your skin becomes irritated. Contact a health care provider if:  You have pain, swelling, or bruising that lasts longer than three days.  You have a fever.  Your skin is red, cracked, or irritated. Get help right away if:  The injured area becomes cold, numb, or pale.  You have severe pain, swelling, bruising, or loss of movement in your finger or toe.  Your finger or toe changes shape (deformity). This information is not intended to replace advice given to you by your health care provider. Make sure you discuss any questions you have with your health care provider. Document Released: 01/24/2005 Document Revised: 05/24/2016 Document Reviewed: 05/11/2015 Elsevier Interactive Patient Education  2018 [place] [employer].   Ankle Sprain, Phase I Rehab Ask your health care provider which exercises are safe for you. Do exercises  exactly as told by your health care provider and adjust them as directed. It is normal to feel mild stretching, pulling, tightness, or discomfort as you do these exercises, but you should stop right away if you feel sudden pain or your pain gets worse.Do not begin these exercises until told by your health care provider. Stretching and range of motion exercises These exercises warm up your muscles and joints and improve the movement and flexibility of your lower leg and ankle. These exercises also help to relieve pain and stiffness. Exercise A: Gastroc and soleus stretch  1. Sit on the floor with your left / right leg  extended. 2. Loop a belt or towel around the ball of your left / right foot. The ball of your foot is on the walking surface, right under your toes. 3. Keep your left / right ankle and foot relaxed and keep your knee straight while you use the belt or towel to pull your foot toward you. You should feel a gentle stretch behind your calf or knee. 4. Hold this position for __________ seconds, then release to the starting position. Repeat the exercise with your knee bent. You can put a pillow or a rolled bath towel under your knee to support it. You should feel a stretch deep in your calf or at your Achilles tendon. Repeat each stretch __________ times. Complete these stretches __________ times a day. Exercise B: Ankle alphabet  1. Sit with your left / right leg supported at the lower leg. ? Do not rest your foot on anything. ? Make sure your foot has room to move freely. 2. Think of your left / right foot as a paintbrush, and move your foot to trace each letter of the alphabet in the air. Keep your hip and knee still while you trace. Make the letters as large as you can without feeling discomfort. 3. Trace every letter from A to Z. Repeat __________ times. Complete this exercise __________ times a day. Strengthening exercises These exercises build strength and endurance in your ankle and lower leg. Endurance is the ability to use your muscles for a long time, even after they get tired. Exercise C: Dorsiflexors  1. Secure a rubber exercise band or tube to an object, such as a table leg, that will stay still when the band is pulled. Secure the other end around your left / right foot. 2. Sit on the floor facing the object, with your left / right leg extended. The band or tube should be slightly tense when your foot is relaxed. 3. Slowly bring your foot toward you, pulling the band tighter. 4. Hold this position for __________ seconds. 5. Slowly return your foot to the starting position. Repeat  __________ times. Complete this exercise __________ times a day. Exercise D: Plantar flexors  1. Sit on the floor with your left / right leg extended. 2. Loop a rubber exercise tube or band around the ball of your left / right foot. The ball of your foot is on the walking surface, right under your toes. ? Hold the ends of the band or tube in your hands. ? The band or tube should be slightly tense when your foot is relaxed. 3. Slowly point your foot and toes downward, pushing them away from you. 4. Hold this position for __________ seconds. 5. Slowly return your foot to the starting position. Repeat __________ times. Complete this exercise __________ times a day. Exercise E: Evertors 1. Sit on the floor with your legs  straight out in front of you. 2. Loop a rubber exercise band or tube around the ball of your left / right foot. The ball of your foot is on the walking surface, right under your toes. ? Hold the ends of the band in your hands, or secure the band to a stable object. ? The band or tube should be slightly tense when your foot is relaxed. 3. Slowly push your foot outward, away from your other leg. 4. Hold this position for __________ seconds. 5. Slowly return your foot to the starting position. Repeat __________ times. Complete this exercise __________ times a day. This information is not intended to replace advice given to you by your health care provider. Make sure you discuss any questions you have with your health care provider. Document Released: 07/18/2005 Document Revised: 08/23/2016 Document Reviewed: 10/31/2015 Elsevier Interactive Patient Education  2018 [employer].

## 2018-10-02 NOTE — Progress Notes (Signed)
Desiree Wilson is a 52 y.o. female who presents to [facility] today for  Right foot and ankle pain and left hand pain.  Desiree Wilson was pulled over by her dog yesterday injuring her right ankle and her left hand.  She has pain and swelling at the right lateral and medial ankle and foot.  She notes pain is worse with ambulation.  She is taken over-the-counter medications for pain along with ice rest elevation which helped a little.  She has difficulty walking because of pain.  Additionally she notes pain and swelling at the left fifth digit at PIP.  She is worried that she may have broken her hand as well.  She is buddy taped her fourth and fifth digits which definitely help.  She feels well otherwise with no fevers or chills.    ROS:  As above  Exam:  BP (!) 141/86   Pulse 93   Wt 202 lb (91.6 kg)   BMI 33.61 kg/m  General: Well Developed, well nourished, and in no acute distress.  Neuro/Psych: Alert and oriented x3, extra-ocular muscles intact, able to move all 4 extremities, sensation grossly intact. Skin: Warm and dry, no rashes noted.  Respiratory: Not using accessory muscles, speaking in full sentences, trachea midline.  Cardiovascular: Pulses palpable, no extremity edema. Abdomen: Does not appear distended. MSK:  Right foot and ankle: Swollen with ecchymosis at the lateral ankle and midfoot. Tender palpation at the lateral malleolus and proximal fifth metatarsal. Pulses cap refill and sensation are intact distally.  Foot and ankle motion are painful.  Stable ligamentous exam.  Intact strength.  Left hand swollen and bruising at fifth PIP.  Tender palpation at PIP.  Normal motion.  Intact flexion and extension strength.    Lab and Radiology Results X-ray images personally independently reviewed.  Right ankle: Old circular avulsion fragments present both at the medial and lateral ankle joint however no acute fractures are visible.   Mild degenerative changes present.  Right foot: Mild degenerative changes no acute fractures present.    Left fifth digit: Tiny nondisplaced avulsion fracture present at the ulnar aspect of the proximal end of the middle phalanx.   Await formal radiology review for the above 3 x-ray orders    Assessment and Plan: 52 y.o. female with  Right foot and ankle pain: Likely sprain.  No fracture visible on today's exam.  Plan for cam walker boot and walker.  Continue existing medication for pain management.  Plan for mild home exercise program and recheck in about 1 week.  Return sooner if needed.  Left fifth digit fracture: Tiny avulsion fracture.  Plan for buddy taping the fourth and fifth digits and recheck in 1 week.  Return sooner if needed.    Orders Placed This Encounter  Procedures  . DG Foot Complete Right    Standing Status:   Future    Standing Expiration Date:   12/03/2019    Order Specific Question:   Reason for Exam (SYMPTOM  OR DIAGNOSIS REQUIRED)    Answer:   eval foot pain followin fall ?5th metatarsal    Order Specific Question:   Is patient pregnant?    Answer:   No    Order Specific Question:   Preferred imaging location?    Answer:   [facility]    Order Specific Question:   Radiology Contrast Protocol - do NOT remove file path    Answer:   \\[email or web address]  . DG  Ankle Complete Right    Standing Status:   Future    Standing Expiration Date:   12/03/2019    Order Specific Question:   Reason for Exam (SYMPTOM  OR DIAGNOSIS REQUIRED)    Answer:   ankle pain follow fall    Order Specific Question:   Is patient pregnant?    Answer:   No    Order Specific Question:   Preferred imaging location?    Answer:   [facility]    Order Specific Question:   Radiology Contrast Protocol - do NOT remove file path    Answer:   \\[email or web address]s.pdf  . DG Finger [place] Left    Order  Specific Question:   Reason for exam:    Answer:   eval pain PIP    Order Specific Question:   Is the patient pregnant?    Answer:   No    Order Specific Question:   Preferred imaging location?    Answer:   [facility]   No orders of the defined types were placed in this encounter.   Historical information moved to improve visibility of documentation.  Past Medical History:  Diagnosis Date  . Allergy    rhinitis  . Anemia   . Blood transfusion    x2 1983 surgery blood loss  . Blood transfusion without reported diagnosis   . Depression   . Facial paralysis    right face/ chronic pain syndrome (pain management Dr Sydell Axon)  . Headache(784.0)   . Hyperlipidemia   . Hypotension   . Iron deficiency anemia 11/15/2014  . Iron malabsorption 11/15/2014  . Osteoarthritis   . Trigeminal neuralgia    right   Past Surgical History:  Procedure Laterality Date  . MANDIBLE SURGERY    . OSTEOTOMY    . 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 Smoking status: Current Some Day Smoker    Years: 6.00    Types: Cigarettes    Last attempt to quit: 12/31/2000    Years since quitting: 17.7  . Smokeless tobacco: Never Used  . Tobacco comment: tobacco infor given  Substance Use Topics  . Alcohol use: No    Alcohol/week: 0.0 standard drinks   family history includes Diverticulitis in her father; Stroke in her father.  Medications: Current Outpatient Medications  Medication Sig Dispense Refill  . ARIPiprazole (ABILIFY) 2 MG tablet TAKE 1 TABLET(2 MG) BY MOUTH DAILY 90 tablet 0  . aspirin-acetaminophen-caffeine (EXCEDRIN MIGRAINE) 283-151-76 MG tablet Take by mouth.    . BELBUCA 300 MCG FILM 2 a day  2  . Botulinum Toxin Type A (BOTOX) 200 units SOLR Inject as directed. Gets 31 injections in her face/neck for migraine prevention    . cholecalciferol (VITAMIN D) 1000 units tablet Take 2,000  Units by mouth daily.    . clonazePAM (KLONOPIN) 1 MG tablet Take 1 tablet (1 mg total) by mouth 2 (two) times daily. 30 tablet 2  . diphenoxylate-atropine (LOMOTIL) 2.5-0.025 MG tablet TAKE 1 TABLET BY MOUTH FOUR TIMES DAILY AS NEEDED FOR DIARRHEA OR LOOSE STOOLS 30 tablet 2  . frovatriptan (FROVA) 2.5 MG tablet Take 2.5 mg by mouth daily as needed.     . hyoscyamine (LEVSIN SL) 0.125 MG SL tablet Take one every 8 hours as needed 30 tablet 2  . ketamine (KETALAR) 10 MG/ML injection Inject into the vein every 8 (eight)  weeks.     . Lactobacillus (PROBIOTIC ACIDOPHILUS) TABS Take by mouth. 3-4 tablets daily before meals    . LORYNA 3-0.02 MG tablet TAKE 1 TABLET BY MOUTH DAILY 84 tablet 4  . Multiple Vitamin (MULTIVITAMIN) capsule Take 1 capsule by mouth daily.    . NUCYNTA 50 MG TABS tablet Take 50 mg by mouth every 4 (four) hours as needed for severe pain. Once daily   0  . omeprazole (PRILOSEC) 40 MG capsule TAKE 1 CAPSULE(40 MG) BY MOUTH DAILY 90 capsule 3  . Oxcarbazepine (TRILEPTAL) 300 MG tablet Take 300 mg by mouth 2 (two) times daily.    . promethazine (PHENERGAN) 25 MG tablet Take 1 tablet (25 mg total) by mouth every 6 (six) hours as needed for nausea or vomiting. 12 tablet 0  . rosuvastatin (CRESTOR) 20 MG tablet Take 1 tablet (20 mg total) by mouth daily. 90 tablet 3  . sertraline (ZOLOFT) 100 MG tablet TAKE 1 AND 1/2 TABLETS(150 MG) BY MOUTH DAILY 135 tablet 1  . sertraline (ZOLOFT) 100 MG tablet TAKE 1 AND 1/2 TABLETS(150 MG) BY MOUTH DAILY 60 tablet 0  . Suvorexant (BELSOMRA) 10 MG TABS Take 10 mg by mouth at bedtime. 90 tablet 0  . tiZANidine (ZANAFLEX) 4 MG tablet Take 4 mg by mouth every 6 (six) hours as needed.    . topiramate (TOPAMAX) 100 MG tablet Take by mouth. Take 3 tabs once daily     . verapamil (CALAN-SR) 180 MG CR tablet Take 1 tablet (180 mg total) by mouth daily. 90 tablet 0   No current facility-administered medications for this visit.    Allergies  Allergen  Reactions  . Tapentadol Anxiety    Other reaction(s): Dizziness  . Azithromycin     REACTION: C Diff  . Methadone Hcl Other (See Comments)    REACTION: Hallucinations  . Phenobarbital     REACTION: Excited      Discussed warning signs or symptoms. Please see discharge instructions. Patient expresses understanding.

## 2018-10-09 ENCOUNTER — Ambulatory Visit: Payer: Medicare Other | Admitting: Family Medicine

## 2018-10-17 NOTE — Progress Notes (Signed)
Subjective:   Desiree Wilson is a 52 y.o. female who presents for Medicare Annual (Subsequent) preventive examination.  Review of Systems:  No ROS.  Medicare Wellness Visit. Additional risk factors are reflected in the social history.  Cardiac Risk Factors include: dyslipidemia Sleep patterns:gets 6 hours of sleep most nights and some nights she doesn't sleep at all but states the Elavil is helping her. Wakes up 2 times to void during the night    Home Safety/Smoke Alarms: Feels safe in home. Smoke alarms in place.  Living environment; Lives with boyfriend in a 2 story home. Handrails in place. Shower is a walk in shower with grab rails in place.    Female:   Pap-  utd     Mammo-  utd     Dexa scan- not due       CCS- utd     Objective:     Vitals: BP 120/82   Pulse 94   Temp 97.8 F (36.6 C) (Oral)   Ht 5\' 5"  (1.651 m)   Wt 203 lb (92.1 kg)   BMI 33.78 kg/m   Body mass index is 33.78 kg/m.  Advanced Directives 10/27/2018 05/16/2018 01/22/2018 02/24/2016 07/26/2015 01/03/2015 12/21/2014  Does Patient Have a Medical Advance Directive? Yes No Yes Yes Yes No Yes  Type of Paramedic of [place];Living will - - - [place];Living will - [place];Living will  Does patient want to make changes to medical advance directive? No - Patient declined - No - Patient declined - No - Patient declined - -  Copy of Deport in Chart? No - copy requested - - - No - copy requested - No - copy requested  Would patient like information on creating a medical advance directive? - - - - - No - patient declined information -    Tobacco Social History   Tobacco Use  Smoking Status Current Some Day Smoker  . Years: 6.00  . Types: Cigarettes  . Last attempt to quit: 12/31/2000  . Years since quitting: 17.8  Smokeless Tobacco Never Used  Tobacco Comment   tobacco infor given     Ready to quit: Not  Answered Counseling given: Not Answered Comment: tobacco infor given   Clinical Intake:                       Past Medical History:  Diagnosis Date  . Allergy    rhinitis  . Anemia   . Blood transfusion    x2 1983 surgery blood loss  . Blood transfusion without reported diagnosis   . Depression   . Facial paralysis    right face/ chronic pain syndrome (pain management Dr Sydell Axon)  . Headache(784.0)   . Hyperlipidemia   . Hypotension   . Iron deficiency anemia 11/15/2014  . Iron malabsorption 11/15/2014  . Osteoarthritis   . Trigeminal neuralgia    right  . Trigeminal neuralgia 2002   Past Surgical History:  Procedure Laterality Date  . gamma knife Right 01/2018  . MANDIBLE SURGERY    . OSTEOTOMY    . 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 Cancer Mother   . Stroke Father   . Diverticulitis Father   . Colon cancer Neg Hx    Social History   Socioeconomic History  .  Marital status: Single    Spouse name: [name]  . Number of children: 0  . Years of education: Masters Degree  . Highest education level: Master's degree (e.g., MA, MS, MEng, MEd, MSW, MBA)  Occupational History  . Occupation: disabled  Social Needs  . Financial resource strain: Not hard at all  . Food insecurity:    Worry: Never true    Inability: Never true  . Transportation needs:    Medical: No    Non-medical: No  Tobacco Use  . Smoking status: Current Some Day Smoker    Years: 6.00    Types: Cigarettes    Last attempt to quit: 12/31/2000    Years since quitting: 17.8  . Smokeless tobacco: Never Used  . Tobacco comment: tobacco infor given  Substance and Sexual Activity  . Alcohol use: No    Alcohol/week: 0.0 standard drinks  . Drug use: No  . Sexual activity: Yes    Partners: Male  Lifestyle  . Physical activity:    Days per week: 0 days    Minutes per session:  0 min  . Stress: Only a little  Relationships  . Social connections:    Talks on phone: More than three times a week    Gets together: Once a week    Attends religious service: Never    Active member of club or organization: Yes    Attends meetings of clubs or organizations: 1 to 4 times per year    Relationship status: Never married  Other Topics Concern  . Not on file  Social History Narrative   Wants to start back exercising. Goal is to loose 20 to 40 lbs in the next year. Coffee in the morning and tea during the day    Outpatient Encounter Medications as of 10/27/2018  Medication Sig  . AMBULATORY NON FORMULARY MEDICATION Standard walker Disp 1 use a needed. M35.361W  . amitriptyline (ELAVIL) 25 MG tablet Take 25 mg by mouth at bedtime.  . ARIPiprazole (ABILIFY) 2 MG tablet TAKE 1 TABLET(2 MG) BY MOUTH DAILY  . aspirin-acetaminophen-caffeine (EXCEDRIN MIGRAINE) 250-250-65 MG tablet Take by mouth.  . BELBUCA 300 MCG FILM 2 a day  . cholecalciferol (VITAMIN D) 1000 units tablet Take 2,000 Units by mouth daily.  . clonazePAM (KLONOPIN) 1 MG tablet Take 1 tablet (1 mg total) by mouth 2 (two) times daily.  . diphenoxylate-atropine (LOMOTIL) 2.5-0.025 MG tablet TAKE 1 TABLET BY MOUTH FOUR TIMES DAILY AS NEEDED FOR DIARRHEA OR LOOSE STOOLS  . frovatriptan (FROVA) 2.5 MG tablet Take 2.5 mg by mouth daily as needed.   . hyoscyamine (LEVSIN SL) 0.125 MG SL tablet Take one every 8 hours as needed  . ketamine (KETALAR) 10 MG/ML injection Inject into the vein every 8 (eight) weeks.   . Lactobacillus (PROBIOTIC ACIDOPHILUS) TABS Take by mouth. 3-4 tablets daily before meals  . LORYNA 3-0.02 MG tablet TAKE 1 TABLET BY MOUTH DAILY  . Multiple Vitamin (MULTIVITAMIN) capsule Take 1 capsule by mouth daily.  . NUCYNTA 50 MG TABS tablet Take 50 mg by mouth every 4 (four) hours as needed for severe pain. Once daily   . omeprazole (PRILOSEC) 40 MG capsule TAKE 1 CAPSULE(40 MG) BY MOUTH DAILY  .  promethazine (PHENERGAN) 25 MG tablet Take 1 tablet (25 mg total) by mouth every 6 (six) hours as needed for nausea or vomiting.  . rosuvastatin (CRESTOR) 20 MG tablet Take 1 tablet (20 mg total) by mouth daily.  . sertraline (ZOLOFT)  100 MG tablet TAKE 1 AND 1/2 TABLETS(150 MG) BY MOUTH DAILY  . tiZANidine (ZANAFLEX) 4 MG tablet Take 4 mg by mouth every 6 (six) hours as needed.  . verapamil (CALAN-SR) 180 MG CR tablet Take 1 tablet (180 mg total) by mouth daily.  . Botulinum Toxin Type A (BOTOX) 200 units SOLR Inject as directed. Gets 31 injections in her face/neck for migraine prevention  . Oxcarbazepine (TRILEPTAL) 300 MG tablet Take 300 mg by mouth 2 (two) times daily.  . Suvorexant (BELSOMRA) 10 MG TABS Take 10 mg by mouth at bedtime. (Patient not taking: Reported on 10/27/2018)  . topiramate (TOPAMAX) 100 MG tablet Take by mouth. Take 3 tabs once daily   . [DISCONTINUED] sertraline (ZOLOFT) 100 MG tablet TAKE 1 AND 1/2 TABLETS(150 MG) BY MOUTH DAILY  . [DISCONTINUED] sertraline (ZOLOFT) 100 MG tablet TAKE 1 AND 1/2 TABLETS(150 MG) BY MOUTH DAILY   No facility-administered encounter medications on file as of 10/27/2018.     Activities of Daily Living In your present state of health, do you have any difficulty performing the following activities: 10/27/2018  Hearing? Y  Comment deaf in right ear  Vision? N  Difficulty concentrating or making decisions? N  Walking or climbing stairs? N  Dressing or bathing? N  Doing errands, shopping? N  Preparing Food and eating ? N  Using the Toilet? N  In the past six months, have you accidently leaked urine? Y  Comment just occasional  Do you have problems with loss of bowel control? N  Managing your Medications? N  Managing your Finances? N  Some recent data might be hidden    Patient Care Team: Hali Marry, MD as PCP - General (Family Medicine) Elmarie Shiley, MD as Consulting Physician (Nephrology) Lucia Bitter., MD as  Consulting Physician (Pain Medicine)    Assessment:   This is a routine wellness examination for Bradleigh.Physical assessment deferred to PCP.   Exercise Activities and Dietary recommendations Current Exercise Habits: The patient does not participate in regular exercise at present, Exercise limited by: orthopedic condition(s);neurologic condition(s) Diet eating fruits daily, tries to get some vegetables in. No dairy except in coffee. Breakfast: bagel with cream cheese Lunch: brunch Dinner: meat, corn on cob, baked potato.      Goals    . Weight (lb) < 200 lb (90.7 kg)     Wants to loose 20-40 lbs in the next year. Start back exercising and drink more water.       Fall Risk Fall Risk  10/27/2018 07/12/2016 12/21/2014 11/19/2014  Falls in the past year? Yes No No No  Number falls in past yr: 1 - - -  Injury with Fall? Yes - - -  Risk Factor Category  High Fall Risk - - -  Risk for fall due to : Impaired mobility;Impaired balance/gait - - -  Follow up Falls prevention discussed - - -   Is the patient's home free of loose throw rugs in walkways, pet beds, electrical cords, etc?   yes      Grab bars in the bathroom? yes      Handrails on the stairs?   yes      Adequate lighting?   yes   Depression Screen PHQ 2/9 Scores 10/27/2018 01/15/2018 07/12/2016 06/17/2015  PHQ - 2 Score 0 1 0 1  PHQ- 9 Score - 5 - 6     Cognitive Function     6CIT Screen 10/27/2018  What Year? 0 points  What month? 0 points  What time? 0 points  Count back from 20 0 points  Months in reverse 0 points  Repeat phrase 0 points  Total Score 0    Immunization History  Administered Date(s) Administered  . Influenza Split 11/25/2005, 12/25/2006, 10/20/2007, 09/26/2008  . Influenza,inj,Quad PF,6+ Mos 08/17/2014, 08/26/2015, 08/20/2016, 01/15/2018  . Influenza-Unspecified 09/30/2013  . Td 08/10/2010    Screening Tests Health Maintenance  Topic Date Due  . HIV Screening  09/16/1981  . INFLUENZA  VACCINE  07/31/2018  . MAMMOGRAM  03/06/2019  . TETANUS/TDAP  08/10/2020  . PAP SMEAR  07/12/2021  . COLONOSCOPY  12/07/2021       Plan:    Please schedule your next medicare wellness visit with me in 1 yr.  Ms. Stare , Thank you for taking time to come for your Medicare Wellness Visit. I appreciate your ongoing commitment to your health goals. Please review the following plan we discussed and let me know if I can assist you in the future.  Bring a copy of your living will and/or healthcare power of attorney to your next office visit. Continue doing brain stimulating activities (puzzles, reading, adult coloring books, staying active) to keep memory sharp.    These are the goals we discussed: Goals    . Weight (lb) < 200 lb (90.7 kg)     Wants to loose 20-40 lbs in the next year. Start back exercising and drink more water.       This is a list of the screening recommended for you and due dates:  Health Maintenance  Topic Date Due  . HIV Screening  09/16/1981  . Flu Shot  07/31/2018  . Mammogram  03/06/2019  . Tetanus Vaccine  08/10/2020  . Pap Smear  07/12/2021  . Colon Cancer Screening  12/07/2021      I have personally reviewed and noted the following in the patient's chart:   . Medical and social history . Use of alcohol, tobacco or illicit drugs  . Current medications and supplements . Functional ability and status . Nutritional status . Physical activity . Advanced directives . List of other physicians . Hospitalizations, surgeries, and ER visits in previous 12 months . Vitals . Screenings to include cognitive, depression, and falls . Referrals and appointments  In addition, I have reviewed and discussed with patient certain preventive protocols, quality metrics, and best practice recommendations. A written personalized care plan for preventive services as well as general preventive health recommendations were provided to patient.     Joanne Chars,  LPN  10/24/8526

## 2018-10-21 ENCOUNTER — Other Ambulatory Visit: Payer: Self-pay | Admitting: Physician Assistant

## 2018-10-27 ENCOUNTER — Ambulatory Visit (INDEPENDENT_AMBULATORY_CARE_PROVIDER_SITE_OTHER): Payer: Medicare Other | Admitting: Family Medicine

## 2018-10-27 ENCOUNTER — Ambulatory Visit (INDEPENDENT_AMBULATORY_CARE_PROVIDER_SITE_OTHER): Payer: Medicare Other | Admitting: *Deleted

## 2018-10-27 VITALS — BP 120/82 | HR 94 | Temp 97.8°F | Ht 65.0 in | Wt 203.0 lb

## 2018-10-27 VITALS — BP 157/96 | HR 94 | Ht 65.0 in | Wt 203.0 lb

## 2018-10-27 DIAGNOSIS — S93491A Sprain of other ligament of right ankle, initial encounter: Secondary | ICD-10-CM | POA: Diagnosis not present

## 2018-10-27 DIAGNOSIS — M25571 Pain in right ankle and joints of right foot: Secondary | ICD-10-CM | POA: Diagnosis not present

## 2018-10-27 DIAGNOSIS — Z0000 Encounter for general adult medical examination without abnormal findings: Secondary | ICD-10-CM | POA: Diagnosis not present

## 2018-10-27 DIAGNOSIS — Z23 Encounter for immunization: Secondary | ICD-10-CM

## 2018-10-27 MED ORDER — DIPHENOXYLATE-ATROPINE 2.5-0.025 MG PO TABS: ORAL_TABLET | ORAL | 2 refills | Status: DC

## 2018-10-27 MED ORDER — SERTRALINE HCL 100 MG PO TABS: ORAL_TABLET | ORAL | 0 refills | Status: DC

## 2018-10-27 NOTE — Progress Notes (Signed)
Desiree Wilson is a 52 y.o. female who presents to [facility] today for follow-up after right ankle sprain ~one month ago. Desiree Wilson wore the cam walking boot for about two weeks. Since this time, she has been wrapping her ankle in tape or an ACE bandage. Her pain is much improved since her injury; however, she continues to have pain with walking and cannot yet walk her dog. She has been doing exercises, and working with her dad, who is a physical therapist. She notes some shooting pains from her foot up her lef at night time. She has been taking 800 mg Ibuprofen 3x/day and Prilosec regularly.    ROS:  As above  Exam:  BP (!) 157/96   Pulse 94   Ht 5\' 5"  (1.651 m)   Wt 203 lb (92.1 kg)   BMI 33.78 kg/m  General: Well Developed, well nourished, and in no acute distress.  Neuro/Psych: Alert and oriented x3, extra-ocular muscles intact, able to move all 4 extremities, sensation grossly intact. Skin: Warm and dry, no rashes noted.  Respiratory: Not using accessory muscles, speaking in full sentences, trachea midline.  Cardiovascular: Pulses palpable, no extremity edema. Abdomen: Does not appear distended. MSK:  Right ankle: Mild swelling at the ATFL lateral ankle area.  Otherwise normal-appearing Normal ankle motion.  Some pain with inversion and resisted eversion. Right foot: Slightly swollen at the dorsal midfoot. Not particularly tender. Pulses cap refill and sensation are intact distally.    Lab and Radiology Results CLINICAL DATA:  Pain following fall  EXAM: RIGHT FOOT COMPLETE - 3+ VIEW  COMPARISON:  None.  FINDINGS: Frontal, oblique, and lateral views were obtained. There is no fracture or dislocation. Joint spaces appear normal. No erosive change. There is a spur arising from the inferior calcaneus.  IMPRESSION: No evident fracture or dislocation. No appreciable arthropathy. There is an inferior calcaneal  spur.   Electronically Signed   By: Lowella Grip III M.D.   On: 10/02/2018 14:44 I personally (independently) visualized and performed the interpretation of the images attached in this note.   Assessment and Plan: 52 y.o. female with  Right foot and ankle sprain: Her ankle has improved since her injury ~one month ago. Advised pt to continue exercises. She should wear an ankle brace with activity. Advised pt to bring walking boot to [place] and to use scooter/wheelchairs as needed. She can walk as tolerated. Follow-up in one month.        Historical information moved to improve visibility of documentation.  Past Medical History:  Diagnosis Date  . Allergy    rhinitis  . Anemia   . Blood transfusion    x2 1983 surgery blood loss  . Blood transfusion without reported diagnosis   . Depression   . Facial paralysis    right face/ chronic pain syndrome (pain management Dr Sydell Axon)  . Headache(784.0)   . Hyperlipidemia   . Hypotension   . Iron deficiency anemia 11/15/2014  . Iron malabsorption 11/15/2014  . Osteoarthritis   . Trigeminal neuralgia    right  . Trigeminal neuralgia 2002   Past Surgical History:  Procedure Laterality Date  . gamma knife Right 01/2018  . MANDIBLE SURGERY    . OSTEOTOMY    . 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 Smoking status: Current Some Day Smoker  Years: 6.00    Types: Cigarettes    Last attempt to quit: 12/31/2000    Years since quitting: 17.8  . Smokeless tobacco: Never Used  . Tobacco comment: tobacco infor given  Substance Use Topics  . Alcohol use: No    Alcohol/week: 0.0 standard drinks   family history includes Cancer in her mother; Diverticulitis in her father; Stroke in her father.  Medications: Current Outpatient Medications  Medication Sig Dispense Refill  . AMBULATORY NON FORMULARY MEDICATION Standard walker Disp 1  use a needed. S93.491A 1 each 0  . ARIPiprazole (ABILIFY) 2 MG tablet TAKE 1 TABLET(2 MG) BY MOUTH DAILY 90 tablet 0  . aspirin-acetaminophen-caffeine (EXCEDRIN MIGRAINE) 700-174-94 MG tablet Take by mouth.    . BELBUCA 300 MCG FILM 2 a day  2  . Botulinum Toxin Type A (BOTOX) 200 units SOLR Inject as directed. Gets 31 injections in her face/neck for migraine prevention    . cholecalciferol (VITAMIN D) 1000 units tablet Take 2,000 Units by mouth daily.    . clonazePAM (KLONOPIN) 1 MG tablet Take 1 tablet (1 mg total) by mouth 2 (two) times daily. 30 tablet 2  . frovatriptan (FROVA) 2.5 MG tablet Take 2.5 mg by mouth daily as needed.     . hyoscyamine (LEVSIN SL) 0.125 MG SL tablet Take one every 8 hours as needed 30 tablet 2  . ketamine (KETALAR) 10 MG/ML injection Inject into the vein every 8 (eight) weeks.     . Lactobacillus (PROBIOTIC ACIDOPHILUS) TABS Take by mouth. 3-4 tablets daily before meals    . LORYNA 3-0.02 MG tablet TAKE 1 TABLET BY MOUTH DAILY 84 tablet 4  . Multiple Vitamin (MULTIVITAMIN) capsule Take 1 capsule by mouth daily.    . NUCYNTA 50 MG TABS tablet Take 50 mg by mouth every 4 (four) hours as needed for severe pain. Once daily   0  . omeprazole (PRILOSEC) 40 MG capsule TAKE 1 CAPSULE(40 MG) BY MOUTH DAILY 90 capsule 3  . Oxcarbazepine (TRILEPTAL) 300 MG tablet Take 300 mg by mouth 2 (two) times daily.    . promethazine (PHENERGAN) 25 MG tablet Take 1 tablet (25 mg total) by mouth every 6 (six) hours as needed for nausea or vomiting. 12 tablet 0  . rosuvastatin (CRESTOR) 20 MG tablet Take 1 tablet (20 mg total) by mouth daily. 90 tablet 3  . Suvorexant (BELSOMRA) 10 MG TABS Take 10 mg by mouth at bedtime. (Patient not taking: Reported on 10/27/2018) 90 tablet 0  . tiZANidine (ZANAFLEX) 4 MG tablet Take 4 mg by mouth every 6 (six) hours as needed.    . topiramate (TOPAMAX) 100 MG tablet Take by mouth. Take 3 tabs once daily     . verapamil (CALAN-SR) 180 MG CR tablet Take  1 tablet (180 mg total) by mouth daily. 90 tablet 0  . amitriptyline (ELAVIL) 25 MG tablet Take 25 mg by mouth at bedtime.    . diphenoxylate-atropine (LOMOTIL) 2.5-0.025 MG tablet TAKE 1 TABLET BY MOUTH FOUR TIMES DAILY AS NEEDED FOR DIARRHEA OR LOOSE STOOLS 30 tablet 2  . sertraline (ZOLOFT) 100 MG tablet Take 1 and 1/2 tablets daily 60 tablet 0   No current facility-administered medications for this visit.    Allergies  Allergen Reactions  . Tapentadol Anxiety    Other reaction(s): Dizziness  . Azithromycin     REACTION: C Diff  . Methadone Hcl Other (See Comments)    REACTION: Hallucinations  . Phenobarbital  REACTION: Excited      Discussed warning signs or symptoms. Please see discharge instructions. Patient expresses understanding.  I personally was present and performed or re-performed the history, physical exam and medical decision-making activities of this service and have verified that the service and findings are accurately documented in the student's note. ___________________________________________ Lynne Leader M.D., ABFM., CAQSM. Primary Care and Sports Medicine Adjunct Instructor of [employer]

## 2018-10-27 NOTE — Patient Instructions (Addendum)
Thank you for coming in today. Use the ankle brace with activity.  Keep working on ankle rehab.  Recheck in 1 month or sooner if needed.  [place] you [place] the [place] you and use as needed.    Ankle Sprain, Phase II Rehab Ask your health care provider which exercises are safe for you. Do exercises exactly as told by your health care provider and adjust them as directed. It is normal to feel mild stretching, pulling, tightness, or discomfort as you do these exercises, but you should stop right away if you feel sudden pain or your pain gets worse.Do not begin these exercises until told by your health care provider. Stretching and range of motion exercises These exercises warm up your muscles and joints and improve the movement and flexibility of your lower leg and ankle. These exercises also help to relieve pain and stiffness. Exercise A: Gastroc stretch, standing  1. Stand with your hands against a wall. 2. Extend your left / right leg behind you, and bend your front knee slightly. Your heels should be on the floor. 3. Keeping your heels on the floor and your back knee straight, shift your weight toward the wall. You should feel a gentle stretch in the back of your lower leg (calf). 4. Hold this position for __________ seconds. Repeat __________ times. Complete this exercise __________ times a day. Exercise B: Soleus stretch, standing 1. Stand with your hands against a wall. 2. Extend your left / right leg behind you, and bend your front knee slightly. Both of your heels should be on the floor. 3. Keeping your heels on the floor, bend your back knee and shift your weight slightly over your back leg. You should feel a gentle stretch deep in your calf. 4. Hold this position for __________ seconds. Repeat __________ times. Complete this exercise __________ times a day. Strengthening exercises These exercises build strength and endurance in your lower leg. Endurance is the ability to  use your muscles for a long time, even after they get tired. Exercise C: Heel walking ( dorsiflexion) Walk on your heels for __________ seconds or ___________ ft. Keep your toes as high as possible. Repeat __________ times. Complete this exercise __________ times a day. Balance exercises These exercises improve your balance and the reaction and control of your ankle to help improve stability. Exercise D: Multi-angle lunge 1. Stand with your feet together. 2. Take a step forward with your left / right leg, and shift your weight onto that leg. Your back heel will come off the floor, and your back toes will stay in place. 3. Push off your front leg to return your front foot to the starting position next to your other foot. 4. Repeat to the side, to the back, and any other directions as told by your health care provider. Repeat in each direction __________ times. Complete this exercise __________ times a day. Exercise E: Single leg stand 1. Without shoes, stand near a railing or in a door frame. Hold onto the railing or door frame as needed. 2. Stand on your left / right foot. Keep your big toe down on the floor and try to keep your arch lifted. 3. Hold this position for __________ seconds. Repeat __________ times. Complete this exercise __________ times a day. If this exercise is too easy, you can try it with your eyes closed or while standing on a pillow. Exercise F: Inversion/eversion  You will need a balance board for this exercise.  Ask your health care provider where you can get a balance board or how you can make one. 1. Stand on a non-carpeted surface near a countertop or wall. 2. Step onto the balance board so your feet are hip-width apart. 3. Keep your feet in place and keep your upper body and hips steady. Using only your feet and ankles to move the board, do one or both of the following exercises as told by your health care provider: ? Tip the board side to side as far as you can,  alternating between tipping to the left and tipping to the right. If you can, tip the board so it silently taps the floor. Do not let the board forcefully hit the floor. From time to time, pause to hold a steady position. ? Tip the board side to side so the board does not hit the floor at all. From time to time, pause to hold a steady position. Repeat the movement for each exercise __________ times. Complete each exercise __________ times a day. Exercise G: Plantar flexion/dorsiflexion  You will need a balance board for this exercise. Ask your health care provider where you can get a balance board or how you can make one. 1. Stand on a non-carpeted surface near a countertop or wall. 2. Step onto the balance board so your feet are hip-width apart. 3. Keep your feet in place and keep your upper body and hips steady. Using only your feet and ankles to move the board, do one or both of the following exercises as told by your health care provider: ? Tip the board forward and backward so the board silently taps the floor. Do not let the board forcefully hit the floor. From time to time, pause to hold a steady position. ? Tip the board forward and backward so the board does not hit the floor at all. From time to time, pause to hold a steady position. Repeat the movement for each exercise __________ times. Complete each exercise __________ times a day. This information is not intended to replace advice given to you by your health care provider. Make sure you discuss any questions you have with your health care provider. Document Released: 04/08/2006 Document Revised: 08/23/2016 Document Reviewed: 10/31/2015 Elsevier Interactive Patient Education  2018 [employer].

## 2018-10-27 NOTE — Patient Instructions (Signed)
Please schedule your next medicare wellness visit with me in 1 yr.  Desiree Wilson , Thank you for taking time to come for your Medicare Wellness Visit. I appreciate your ongoing commitment to your health goals. Please review the following plan we discussed and let me know if I can assist you in the future.  Bring a copy of your living will and/or healthcare power of attorney to your next office visit. Continue doing brain stimulating activities (puzzles, reading, adult coloring books, staying active) to keep memory sharp.  These are the goals we discussed: Goals    . Weight (lb) < 200 lb (90.7 kg)     Wants to loose 20-40 lbs in the next year. Start back exercising and drink more water.

## 2018-11-09 ENCOUNTER — Other Ambulatory Visit: Payer: Self-pay | Admitting: Family Medicine

## 2018-11-10 ENCOUNTER — Other Ambulatory Visit: Payer: Self-pay | Admitting: *Deleted

## 2018-11-12 DIAGNOSIS — G501 Atypical facial pain: Secondary | ICD-10-CM | POA: Diagnosis not present

## 2018-11-21 DIAGNOSIS — L820 Inflamed seborrheic keratosis: Secondary | ICD-10-CM | POA: Diagnosis not present

## 2018-11-21 DIAGNOSIS — L249 Irritant contact dermatitis, unspecified cause: Secondary | ICD-10-CM | POA: Diagnosis not present

## 2018-11-21 DIAGNOSIS — Z08 Encounter for follow-up examination after completed treatment for malignant neoplasm: Secondary | ICD-10-CM | POA: Diagnosis not present

## 2018-11-21 DIAGNOSIS — Z85828 Personal history of other malignant neoplasm of skin: Secondary | ICD-10-CM | POA: Diagnosis not present

## 2018-12-28 ENCOUNTER — Other Ambulatory Visit: Payer: Self-pay | Admitting: Family Medicine

## 2018-12-28 ENCOUNTER — Other Ambulatory Visit: Payer: Self-pay | Admitting: Physician Assistant

## 2019-01-27 DIAGNOSIS — G500 Trigeminal neuralgia: Secondary | ICD-10-CM | POA: Diagnosis not present

## 2019-01-27 DIAGNOSIS — G501 Atypical facial pain: Secondary | ICD-10-CM | POA: Diagnosis not present

## 2019-01-27 DIAGNOSIS — G894 Chronic pain syndrome: Secondary | ICD-10-CM | POA: Diagnosis not present

## 2019-02-10 ENCOUNTER — Other Ambulatory Visit: Payer: Self-pay

## 2019-02-10 MED ORDER — SERTRALINE HCL 100 MG PO TABS: ORAL_TABLET | ORAL | 0 refills | Status: DC

## 2019-02-17 DIAGNOSIS — G501 Atypical facial pain: Secondary | ICD-10-CM | POA: Diagnosis not present

## 2019-02-24 ENCOUNTER — Ambulatory Visit: Payer: Medicare Other | Admitting: Family Medicine

## 2019-03-09 ENCOUNTER — Ambulatory Visit: Payer: Medicare Other | Admitting: Family Medicine

## 2019-03-09 ENCOUNTER — Encounter: Payer: Self-pay | Admitting: Family Medicine

## 2019-03-09 VITALS — BP 126/86 | HR 97 | Ht 65.0 in | Wt 214.0 lb

## 2019-03-09 DIAGNOSIS — I10 Essential (primary) hypertension: Secondary | ICD-10-CM | POA: Diagnosis not present

## 2019-03-09 DIAGNOSIS — N183 Chronic kidney disease, stage 3 unspecified: Secondary | ICD-10-CM

## 2019-03-09 DIAGNOSIS — R635 Abnormal weight gain: Secondary | ICD-10-CM

## 2019-03-09 DIAGNOSIS — E785 Hyperlipidemia, unspecified: Secondary | ICD-10-CM

## 2019-03-09 DIAGNOSIS — Z1231 Encounter for screening mammogram for malignant neoplasm of breast: Secondary | ICD-10-CM | POA: Diagnosis not present

## 2019-03-09 DIAGNOSIS — G500 Trigeminal neuralgia: Secondary | ICD-10-CM

## 2019-03-09 DIAGNOSIS — F3341 Major depressive disorder, recurrent, in partial remission: Secondary | ICD-10-CM

## 2019-03-09 MED ORDER — DROSPIRENONE-ETHINYL ESTRADIOL 3-0.02 MG PO TABS: 1.0000 | ORAL_TABLET | Freq: Every day | ORAL | 4 refills | Status: DC

## 2019-03-09 MED ORDER — SERTRALINE HCL 100 MG PO TABS: ORAL_TABLET | ORAL | 1 refills | Status: DC

## 2019-03-09 NOTE — Progress Notes (Signed)
Subjective:    CC:   HPI:  Hypertension- Pt denies chest pain, SOB, dizziness, or heart palpitations.  Taking meds as directed w/o problems.  Denies medication side effects.    Hyperlipidemia - she is dong well on inc dose of statin. No Myalgias or side effects.    F/u CKD 3 -due to recheck renal function she is not having any symptoms.  Trigeminal neuralgia-she just wanted to let me know that she has really done so much better after having the gamma knife surgery in fact her pain and quality of life has been improved by about 50%.  She is only having to get her ketamine infusions every 3 months instead of every 8 weeks it has been life-changing.  She is also still on amitriptyline.  Major depressive disorder-overall doing well.  She feels like the Zoloft works fairly well she still has had some mood fluctuations recently she has had a lot of things going on with her parents that they are getting older.  She has had a few crying spells recently.  Her depressive disorder-continue with Zoloft.  Past medical history, Surgical history, Family history not pertinant except as noted below, Social history, Allergies, and medications have been entered into the medical record, reviewed, and corrections made.   Review of Systems: No fevers, chills, night sweats, weight loss, chest pain, or shortness of breath.   Objective:    General: Well Developed, well nourished, and in no acute distress.  Neuro: Alert and oriented x3, extra-ocular muscles intact, sensation grossly intact.  HEENT: Normocephalic, atraumatic  Skin: Warm and dry, no rashes. Cardiac: Regular rate and rhythm, no murmurs rubs or gallops, no lower extremity edema.  Respiratory: Clear to auscultation bilaterally. Not using accessory muscles, speaking in full sentences.   Impression and Recommendations:    HTN - Well controlled. Continue current regimen. Follow up in  6 months.   Hyeprlipidemia - Due to recheck cholesterol.    CKD  3 - due to recheck renal function.   Weight gain-we discussed her allergies around weight loss she really would like to get back down to about 170 pounds.  She says she really struggles with cutting back on sweet tea that is what she drinks all day so we discussed that in addition to trying to start to walk and get more active.  MDD - continue Zoloft.  Call if any problems. F/U in 4-6 months.    Trigeminal Neuralgia - she has had about a 50% reduction in pain after gamma knife procedure.    Order screening mammogram.  Major depressive disorder-continue with Zoloft.

## 2019-03-10 LAB — COMPLETE METABOLIC PANEL WITH GFR
AG RATIO: 1.3 (calc) (ref 1.0–2.5)
ALBUMIN MSPROF: 3.7 g/dL (ref 3.6–5.1)
ALKALINE PHOSPHATASE (APISO): 101 U/L (ref 37–153)
ALT: 9 U/L (ref 6–29)
AST: 13 U/L (ref 10–35)
BILIRUBIN TOTAL: 0.2 mg/dL (ref 0.2–1.2)
BUN / CREAT RATIO: 13 (calc) (ref 6–22)
BUN: 15 mg/dL (ref 7–25)
CHLORIDE: 106 mmol/L (ref 98–110)
CO2: 21 mmol/L (ref 20–32)
Calcium: 9 mg/dL (ref 8.6–10.4)
Creat: 1.14 mg/dL — ABNORMAL HIGH (ref 0.50–1.05)
GFR, Est African American: 64 mL/min/{1.73_m2} (ref >=60)
GFR, Est Non African American: 55 mL/min/{1.73_m2} — ABNORMAL LOW (ref >=60)
GLUCOSE: 90 mg/dL (ref 65–99)
Globulin: 2.8 g/dL (calc) (ref 1.9–3.7)
POTASSIUM: 4.5 mmol/L (ref 3.5–5.3)
SODIUM: 137 mmol/L (ref 135–146)
TOTAL PROTEIN: 6.5 g/dL (ref 6.1–8.1)

## 2019-03-10 LAB — LIPID PANEL
Cholesterol: 215 mg/dL — ABNORMAL HIGH (ref <200)
HDL: 76 mg/dL (ref >=50)
LDL Cholesterol (Calc): 111 mg/dL (calc) — ABNORMAL HIGH
Non-HDL Cholesterol (Calc): 139 mg/dL (calc) — ABNORMAL HIGH (ref <130)
Total CHOL/HDL Ratio: 2.8 (calc) (ref <5.0)
Triglycerides: 163 mg/dL — ABNORMAL HIGH (ref <150)

## 2019-03-17 ENCOUNTER — Telehealth: Payer: Self-pay | Admitting: Family Medicine

## 2019-03-17 ENCOUNTER — Encounter: Payer: Self-pay | Admitting: Family Medicine

## 2019-03-17 NOTE — Telephone Encounter (Signed)
Pt advised that her letter is ready for p/u. She stated that she will come by to get this.Desiree Wilson, [place]

## 2019-03-17 NOTE — Telephone Encounter (Signed)
Call patient and let her know that her letter is ready for pickup for tax relief program

## 2019-03-25 ENCOUNTER — Other Ambulatory Visit: Payer: Self-pay | Admitting: Family Medicine

## 2019-05-06 ENCOUNTER — Encounter: Payer: Self-pay | Admitting: Family Medicine

## 2019-05-06 ENCOUNTER — Ambulatory Visit (INDEPENDENT_AMBULATORY_CARE_PROVIDER_SITE_OTHER): Payer: Medicare Other | Admitting: Family Medicine

## 2019-05-06 VITALS — Ht 65.0 in | Wt 215.0 lb

## 2019-05-06 DIAGNOSIS — R197 Diarrhea, unspecified: Secondary | ICD-10-CM

## 2019-05-06 DIAGNOSIS — R112 Nausea with vomiting, unspecified: Secondary | ICD-10-CM

## 2019-05-06 MED ORDER — PROMETHAZINE HCL 25 MG PO TABS: 25.0000 mg | ORAL_TABLET | Freq: Three times a day (TID) | ORAL | 0 refills | Status: DC | PRN

## 2019-05-06 NOTE — Progress Notes (Signed)
Virtual Visit via Video Note  I connected with Desiree Wilson on 05/06/19 at  2:00 PM EDT by a video enabled telemedicine application and verified that I am speaking with the correct person using two identifiers.   I discussed the limitations of evaluation and management by telemedicine and the availability of in person appointments. The patient expressed understanding and agreed to proceed.  Pt was at home and I was in my office for the virtual visit.      Subjective:    CC: vomiting and diarrhea.   HPI:  Sunday night stomach and then Monday morning starting  Having diarrhea. Stools have been watery. No blood in the stool.  Feels very fatigued. Vomiting started yesterday. Vomited 4 times.  HA and runny nose. Dry cough, rare. Afraid of COVID. So far hasn't thrown up today, but very nauseated.  No known sick contacts. Went to [employer] store ON Sat.  No fever.  No chills or sweats.   Took some old phenergan last night.Had a lot of abdominal cramping.   No SOB.  Eating makes her feel more nauseated.  No significant alleviating symptoms.   Past medical history, Surgical history, Family history not pertinant except as noted below, Social history, Allergies, and medications have been entered into the medical record, reviewed, and corrections made.   Review of Systems: No fevers, chills, night sweats, weight loss, chest pain, or shortness of breath.   Objective:    General: Speaking clearly in complete sentences without any shortness of breath.  Alert and oriented x3.  Normal judgment. No apparent acute distress.  Lying in bed.    Impression and Recommendations:    Nausea, vomiting, and diarrhea.  Discussed that it could be gastroenteritis either viral or bacterial.  She does not have a cough or shortness of breath but it still could possibly be COVID.  I did discuss with her that some people present with just diarrhea but to watch out for new symptoms.  She has not vomited again today and  her stools are loose and not quite as watery as they were on Monday so would like to just watch this over the next 24 to 48 hours and see if she continues to improve each day.  She was able to keep a couple of crackers down today.  Prescription for Phenergan sent to the pharmacy.  Did warn her to call if she gets any worsening symptoms or new symptoms.      I discussed the assessment and treatment plan with the patient. The patient was provided an opportunity to ask questions and all were answered. The patient agreed with the plan and demonstrated an understanding of the instructions.   The patient was advised to call back or seek an in-person evaluation if the symptoms worsen or if the condition fails to improve as anticipated.   [name], MD

## 2019-05-06 NOTE — Progress Notes (Signed)
Unable to get vital signs, states she does not feel like she has a fever.   Diarrhea since Sunday. Fatigued. Vomiting started yesterday. HA and runny nose. Dry cough, rare. Unsure of onset but afraid of COVID.

## 2019-05-20 DIAGNOSIS — G501 Atypical facial pain: Secondary | ICD-10-CM | POA: Diagnosis not present

## 2019-05-21 ENCOUNTER — Other Ambulatory Visit: Payer: Self-pay | Admitting: Family Medicine

## 2019-06-14 ENCOUNTER — Other Ambulatory Visit: Payer: Self-pay | Admitting: Family Medicine

## 2019-07-27 DIAGNOSIS — G500 Trigeminal neuralgia: Secondary | ICD-10-CM | POA: Diagnosis not present

## 2019-07-27 DIAGNOSIS — G4709 Other insomnia: Secondary | ICD-10-CM | POA: Diagnosis not present

## 2019-07-27 DIAGNOSIS — M2669 Other specified disorders of temporomandibular joint: Secondary | ICD-10-CM | POA: Diagnosis not present

## 2019-07-27 DIAGNOSIS — G43009 Migraine without aura, not intractable, without status migrainosus: Secondary | ICD-10-CM | POA: Diagnosis not present

## 2019-08-04 ENCOUNTER — Other Ambulatory Visit: Payer: Self-pay | Admitting: Family Medicine

## 2019-08-18 DIAGNOSIS — G894 Chronic pain syndrome: Secondary | ICD-10-CM | POA: Diagnosis not present

## 2019-09-10 ENCOUNTER — Ambulatory Visit: Payer: Medicare Other | Admitting: Family Medicine

## 2019-09-15 ENCOUNTER — Ambulatory Visit: Payer: Medicare Other | Admitting: Family Medicine

## 2019-09-16 DIAGNOSIS — G43709 Chronic migraine without aura, not intractable, without status migrainosus: Secondary | ICD-10-CM | POA: Diagnosis not present

## 2019-09-16 DIAGNOSIS — G894 Chronic pain syndrome: Secondary | ICD-10-CM | POA: Diagnosis not present

## 2019-09-16 DIAGNOSIS — G501 Atypical facial pain: Secondary | ICD-10-CM | POA: Diagnosis not present

## 2019-09-16 DIAGNOSIS — F1190 Opioid use, unspecified, uncomplicated: Secondary | ICD-10-CM | POA: Diagnosis not present

## 2019-09-29 ENCOUNTER — Other Ambulatory Visit: Payer: Self-pay | Admitting: Family Medicine

## 2019-10-11 ENCOUNTER — Other Ambulatory Visit: Payer: Self-pay | Admitting: Family Medicine

## 2019-10-14 DIAGNOSIS — G501 Atypical facial pain: Secondary | ICD-10-CM | POA: Diagnosis not present

## 2019-10-27 DIAGNOSIS — G43009 Migraine without aura, not intractable, without status migrainosus: Secondary | ICD-10-CM | POA: Diagnosis not present

## 2019-10-27 DIAGNOSIS — G501 Atypical facial pain: Secondary | ICD-10-CM | POA: Diagnosis not present

## 2019-10-28 ENCOUNTER — Encounter: Payer: Self-pay | Admitting: Family Medicine

## 2019-10-28 ENCOUNTER — Ambulatory Visit (INDEPENDENT_AMBULATORY_CARE_PROVIDER_SITE_OTHER): Payer: Medicare Other | Admitting: Family Medicine

## 2019-10-28 DIAGNOSIS — N1831 Chronic kidney disease, stage 3a: Secondary | ICD-10-CM | POA: Diagnosis not present

## 2019-10-28 DIAGNOSIS — G500 Trigeminal neuralgia: Secondary | ICD-10-CM

## 2019-10-28 DIAGNOSIS — F3341 Major depressive disorder, recurrent, in partial remission: Secondary | ICD-10-CM

## 2019-10-28 DIAGNOSIS — N912 Amenorrhea, unspecified: Secondary | ICD-10-CM | POA: Diagnosis not present

## 2019-10-28 MED ORDER — SERTRALINE HCL 100 MG PO TABS
ORAL_TABLET | ORAL | 1 refills | Status: DC
Start: 2019-10-28 — End: 2020-05-20

## 2019-10-28 NOTE — Assessment & Plan Note (Signed)
Overall feels like she is doing well on her current regimen so we will go ahead and refill her sertraline today.  Follow-up in 6 months.

## 2019-10-28 NOTE — Progress Notes (Signed)
Wanted to discuss birth control and weight gain.Desiree Wilson, [place]

## 2019-10-28 NOTE — Assessment & Plan Note (Signed)
Recently just restarted the ketamine infusions but unfortunately this 1 did not work but she does have a follow-up in about 2 weeks to go over her medication regimen and they may make some adjustments at that time she has been really in a lot of pain.

## 2019-10-28 NOTE — Assessment & Plan Note (Signed)
Due to recheck renal function. 

## 2019-10-28 NOTE — Progress Notes (Signed)
Virtual Visit via Video Note  I connected with Desiree Wilson on 10/28/19 at 11:10 AM EDT by a video enabled telemedicine application and verified that I am speaking with the correct person using two identifiers.   I discussed the limitations of evaluation and management by telemedicine and the availability of in person appointments. The patient expressed understanding and agreed to proceed.    Established Patient Office Visit  Subjective:  Patient ID: Desiree Wilson, female    DOB: [birth year]  Age: 53 y.o. MRN: [id number]  CC:  Chief Complaint  Patient presents with  . Weight Gain  . Hypertension  . mood    HPI Desiree Wilson presents for follow-up.  She has a history of trigeminal neuralgia.  She had been getting ketamine infusions which were working really well for her for pain control but because of Covid she went on this 5 months without an infusion.  She was finally able to get 1 around mid-October and says it did not really help which was unusual.  So she does have a follow-up appointment with a specialist in a couple of weeks to discuss possible medication changes and maybe even a repeat ketamine infusion.  She says the pain is been really disruptive to her sleep.  She follows at [facility].  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  It is about 10 hours/week and she can do it whenever she wants.  She says this is actually given her a lot of pride and satisfaction.  She also wants to discuss her birth control.  She says ever since her husband had a heart attack last year he has had problems with erectile dysfunction.  Now that she is 16 she wonders if she even still needs birth control.  He often skips the placebo pills and says she has not had a period in over a year.  No up depression-overall she is doing well with her Zoloft.  She does need refills sent to the pharmacy.  She would like to continue with  her current regimen.  Again finding a little bit of joy in her 10 hour/week job that she is doing.  He is also gained some weight recently and is a little frustrated with that.   Past Medical History:  Diagnosis Date  . Allergy    rhinitis  . Anemia   . Blood transfusion    x2 1983 surgery blood loss  . Blood transfusion without reported diagnosis   . Depression   . Facial paralysis    right face/ chronic pain syndrome (pain management Dr Sydell Axon)  . Headache(784.0)   . Hyperlipidemia   . Hypotension   . Iron deficiency anemia 11/15/2014  . Iron malabsorption 11/15/2014  . Osteoarthritis   . Trigeminal neuralgia    right  . Trigeminal neuralgia 2002    Past Surgical History:  Procedure Laterality Date  . gamma knife Right 01/2018  . MANDIBLE SURGERY    . OSTEOTOMY    . 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 Cancer Mother   . Stroke Father   . Diverticulitis Father   . Colon cancer Neg Hx     Social History   Socioeconomic History  . Marital status: Single    Spouse name: Desiree Wilson  . Number of  children: 0  . Years of education: Masters Degree  . Highest education level: Master's degree (e.g., MA, MS, MEng, MEd, MSW, MBA)  Occupational History  . Occupation: disabled  Social Needs  . Financial resource strain: Not hard at all  . Food insecurity    Worry: Never true    Inability: Never true  . Transportation needs    Medical: No    Non-medical: No  Tobacco Use  . Smoking status: Current Some Day Smoker    Years: 6.00    Types: Cigarettes    Last attempt to quit: 12/31/2000    Years since quitting: 18.8  . Smokeless tobacco: Never Used  . Tobacco comment: tobacco infor given  Substance and Sexual Activity  . Alcohol use: No    Alcohol/week: 0.0 standard drinks  . Drug use: No  . Sexual activity: Yes    Partners: Male  Lifestyle  . Physical  activity    Days per week: 0 days    Minutes per session: 0 min  . Stress: Only a little  Relationships  . Social connections    Talks on phone: More than three times a week    Gets together: Once a week    Attends religious service: Never    Active member of club or organization: Yes    Attends meetings of clubs or organizations: 1 to 4 times per year    Relationship status: Never married  . Intimate partner violence    Fear of current or ex partner: No    Emotionally abused: No    Physically abused: No    Forced sexual activity: No  Other Topics Concern  . Not on file  Social History Narrative   Wants to start back exercising. Goal is to loose 20 to 40 lbs in the next year. Coffee in the morning and tea during the day    Outpatient Medications Prior to Visit  Medication Sig Dispense Refill  . amitriptyline (ELAVIL) 25 MG tablet Take 1 tablet by mouth at bedtime.    . ARIPiprazole (ABILIFY) 2 MG tablet TAKE 1 TABLET BY MOUTH DAILY 90 tablet 1  . clonazePAM (KLONOPIN) 1 MG tablet Take 1 tablet (1 mg total) by mouth 2 (two) times daily. 30 tablet 2  . diphenoxylate-atropine (LOMOTIL) 2.5-0.025 MG tablet TAKE 1 TABLET BY MOUTH FOUR TIMES DAILY AS NEEDED FOR DIARRHEA OR LOOSE STOOL 30 tablet 4  . drospirenone-ethinyl estradiol (LORYNA) 3-0.02 MG tablet Take 1 tablet by mouth daily. 84 tablet 4  . frovatriptan (FROVA) 2.5 MG tablet Take 2.5 mg by mouth daily as needed.     . hyoscyamine (LEVSIN SL) 0.125 MG SL tablet Take one every 8 hours as needed 30 tablet 2  . ketamine (KETALAR) 10 MG/ML injection Inject into the vein every 3 (three) months.     . Multiple Vitamin (MULTIVITAMIN) capsule Take 1 capsule by mouth daily.    . NUCYNTA 50 MG TABS tablet Take 50 mg by mouth every 4 (four) hours as needed for severe pain. Once daily   0  . omeprazole (PRILOSEC) 40 MG capsule TAKE 1 CAPSULE(40 MG) BY MOUTH DAILY 90 capsule 3  . Oxcarbazepine (TRILEPTAL) 300 MG tablet Take 300 mg by mouth 2  (two) times daily.    . promethazine (PHENERGAN) 25 MG tablet Take 1 tablet (25 mg total) by mouth every 8 (eight) hours as needed for nausea or vomiting. 20 tablet 0  . rosuvastatin (CRESTOR) 20 MG tablet TAKE 1 TABLET(20  MG) BY MOUTH DAILY 90 tablet 3  . tiZANidine (ZANAFLEX) 4 MG tablet Take 4 mg by mouth every 6 (six) hours as needed.    . sertraline (ZOLOFT) 100 MG tablet TAKE 1.5 TABLETS BY MOUTH DAILY. Needs appt for refills 45 tablet 0  . aspirin-acetaminophen-caffeine (EXCEDRIN MIGRAINE) 250-250-65 MG tablet Take by mouth.    Marland Kitchen amitriptyline (ELAVIL) 10 MG tablet Take 10 mg by mouth.  11  . Botulinum Toxin Type A (BOTOX) 200 units SOLR Inject as directed. Gets 31 injections in her face/neck for migraine prevention    . Buprenorphine HCl (BELBUCA) 750 MCG FILM Place inside cheek. When needed for pain    . Lactobacillus (PROBIOTIC ACIDOPHILUS) TABS Take by mouth. 3-4 tablets daily before meals     No facility-administered medications prior to visit.     Allergies  Allergen Reactions  . Azithromycin Rash    REACTION: C Diff Other reaction(s): Other (See Comments), Other (See Comments) REACTION: C Diff REACTION: C Diff REACTION: C Diff   . Methadone     Other reaction(s): Hallucinations, Other (See Comments), Other (See Comments) Hallucinations Hallucinations Hallucinations   . Morphine And Related Hives  . Tapentadol Anxiety    Other reaction(s): Dizziness  . Methadone Hcl Other (See Comments)    REACTION: Hallucinations  . Phenobarbital     REACTION: Excited    ROS Review of Systems    Objective:    Physical Exam  Constitutional: She is oriented to person, place, and time. She appears well-developed and well-nourished.  HENT:  Head: Normocephalic and atraumatic.  Eyes: Conjunctivae and EOM are normal.  Pulmonary/Chest: Effort normal.  Neurological: She is alert and oriented to person, place, and time.  Skin: Skin is dry. No pallor.  Psychiatric: She has a  normal mood and affect. Her behavior is normal.  Vitals reviewed.   There were no vitals taken for this visit. Wt Readings from Last 3 Encounters:  05/06/19 215 lb (97.5 kg)  03/09/19 214 lb (97.1 kg)  10/27/18 203 lb (92.1 kg)     Health Maintenance Due  Topic Date Due  . MAMMOGRAM  03/06/2019    There are no preventive care reminders to display for this patient.  Lab Results  Component Value Date   TSH 2.05 05/14/2016   Lab Results  Component Value Date   WBC 10.3 05/14/2016   HGB 13.8 05/14/2016   HCT 42.0 05/14/2016   MCV 84.0 05/14/2016   PLT 176 05/14/2016   Lab Results  Component Value Date   NA 137 03/09/2019   K 4.5 03/09/2019   CO2 21 03/09/2019   GLUCOSE 90 03/09/2019   BUN 15 03/09/2019   CREATININE 1.14 (H) 03/09/2019   BILITOT 0.2 03/09/2019   ALKPHOS 94 03/05/2017   AST 13 03/09/2019   ALT 9 03/09/2019   PROT 6.5 03/09/2019   ALBUMIN 4.1 03/05/2017   CALCIUM 9.0 03/09/2019   Lab Results  Component Value Date   CHOL 215 (H) 03/09/2019   Lab Results  Component Value Date   HDL 76 03/09/2019   Lab Results  Component Value Date   LDLCALC 111 (H) 03/09/2019   Lab Results  Component Value Date   TRIG 163 (H) 03/09/2019   Lab Results  Component Value Date   CHOLHDL 2.8 03/09/2019   Lab Results  Component Value Date   HGBA1C 5.6 07/15/2017      Assessment & Plan:   Problem List Items Addressed This Visit  Nervous and Auditory   Trigeminal neuralgia    Recently just restarted the ketamine infusions but unfortunately this 1 did not work but she does have a follow-up in about 2 weeks to go over her medication regimen and they may make some adjustments at that time she has been really in a lot of pain.      Relevant Medications   amitriptyline (ELAVIL) 25 MG tablet   sertraline (ZOLOFT) 100 MG tablet     Genitourinary   CKD (chronic kidney disease) stage 3, GFR 30-59 ml/min - Primary    Due to recheck renal function.       Relevant Orders   BASIC METABOLIC PANEL WITH GFR   Urine Microalbumin w/creat. ratio     Other   Depression    Overall feels like she is doing well on her current regimen so we will go ahead and refill her sertraline today.  Follow-up in 6 months.      Relevant Medications   amitriptyline (ELAVIL) 25 MG tablet   sertraline (ZOLOFT) 100 MG tablet    Other Visit Diagnoses    Amenorrhea         Amenorrhea-she is now 55 so I really would like for her to finish up her current birth control pack and then come off for 1 month and then check her hormone levels to see if she is now postmenopausal she may no longer need the birth control.  Meds ordered this encounter  Medications  . sertraline (ZOLOFT) 100 MG tablet    Sig: TAKE 1.5 TABLETS BY MOUTH DAILY.    Dispense:  145 tablet    Refill:  1    Follow-up: Return in about 6 months (around 04/27/2020) for Medications.    I discussed the assessment and treatment plan with the patient. The patient was provided an opportunity to ask questions and all were answered. The patient agreed with the plan and demonstrated an understanding of the instructions.   The patient was advised to call back or seek an in-person evaluation if the symptoms worsen or if the condition fails to improve as anticipated.    [name], MD

## 2019-11-01 NOTE — Progress Notes (Deleted)
Subjective:   Desiree Wilson is a 53 y.o. female who presents for Medicare Annual (Subsequent) preventive examination.  Review of Systems:  No ROS.  Medicare Wellness Virtual Visit.  Visual/audio telehealth visit, UTA vital signs.   See social history for additional risk factors.      Sleep patterns:    Home Safety/Smoke Alarms: Feels safe in home. Smoke alarms in place.  Living environment; Seat Belt Safety/Bike Helmet: Wears seat belt.   Female:   Pap-  UTD     Mammo-  Ordered     Dexa scan- not due       CCS- UTD      Objective:     Vitals: There were no vitals taken for this visit.  There is no height or weight on file to calculate BMI.  Advanced Directives 10/27/2018 05/16/2018 01/22/2018 02/24/2016 07/26/2015 01/03/2015 12/21/2014  Does Patient Have a Medical Advance Directive? Yes No Yes Yes Yes No Yes  Type of Paramedic of [place];Living will - - - [place];Living will - [place];Living will  Does patient want to make changes to medical advance directive? No - Patient declined - No - Patient declined - No - Patient declined - -  Copy of [place] in Chart? No - copy requested - - - No - copy requested - No - copy requested  Would patient like information on creating a medical advance directive? - - - - - No - patient declined information -    Tobacco Social History   Tobacco Use  Smoking Status Current Some Day Smoker  . Years: 6.00  . Types: Cigarettes  . Last attempt to quit: 12/31/2000  . Years since quitting: 18.8  Smokeless Tobacco Never Used  Tobacco Comment   tobacco infor given     Ready to quit: Not Answered Counseling given: Not Answered Comment: tobacco infor given   Clinical Intake:                       Past Medical History:  Diagnosis Date  . Allergy    rhinitis  . Anemia   . Blood transfusion    x2 1983 surgery blood loss  . Blood  transfusion without reported diagnosis   . Depression   . Facial paralysis    right face/ chronic pain syndrome (pain management Dr Sydell Axon)  . Headache(784.0)   . Hyperlipidemia   . Hypotension   . Iron deficiency anemia 11/15/2014  . Iron malabsorption 11/15/2014  . Osteoarthritis   . Trigeminal neuralgia    right  . Trigeminal neuralgia 2002   Past Surgical History:  Procedure Laterality Date  . gamma knife Right 01/2018  . MANDIBLE SURGERY    . OSTEOTOMY    . 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 Cancer Mother   . Stroke Father   . Diverticulitis Father   . Colon cancer Neg Hx    Social History   Socioeconomic History  . Marital status: Single    Spouse name: [name]  . Number of children: 0  . Years of education: Masters Degree  . Highest education level: Master's degree (e.g., MA, MS, MEng, MEd, MSW, MBA)  Occupational History  . Occupation: disabled  Social Needs  . Financial resource strain: Not hard at all  .  Food insecurity    Worry: Never true    Inability: Never true  . Transportation needs    Medical: No    Non-medical: No  Tobacco Use  . Smoking status: Current Some Day Smoker    Years: 6.00    Types: Cigarettes    Last attempt to quit: 12/31/2000    Years since quitting: 18.8  . Smokeless tobacco: Never Used  . Tobacco comment: tobacco infor given  Substance and Sexual Activity  . Alcohol use: No    Alcohol/week: 0.0 standard drinks  . Drug use: No  . Sexual activity: Yes    Partners: Male  Lifestyle  . Physical activity    Days per week: 0 days    Minutes per session: 0 min  . Stress: Only a little  Relationships  . Social connections    Talks on phone: More than three times a week    Gets together: Once a week    Attends religious service: Never    Active member of club or organization: Yes    Attends meetings of clubs or  organizations: 1 to 4 times per year    Relationship status: Never married  Other Topics Concern  . Not on file  Social History Narrative   Wants to start back exercising. Goal is to loose 20 to 40 lbs in the next year. Coffee in the morning and tea during the day    Outpatient Encounter Medications as of 11/02/2019  Medication Sig  . amitriptyline (ELAVIL) 25 MG tablet Take 1 tablet by mouth at bedtime.  . ARIPiprazole (ABILIFY) 2 MG tablet TAKE 1 TABLET BY MOUTH DAILY  . aspirin-acetaminophen-caffeine (EXCEDRIN MIGRAINE) 250-250-65 MG tablet Take by mouth.  . clonazePAM (KLONOPIN) 1 MG tablet Take 1 tablet (1 mg total) by mouth 2 (two) times daily.  . diphenoxylate-atropine (LOMOTIL) 2.5-0.025 MG tablet TAKE 1 TABLET BY MOUTH FOUR TIMES DAILY AS NEEDED FOR DIARRHEA OR LOOSE STOOL  . drospirenone-ethinyl estradiol (LORYNA) 3-0.02 MG tablet Take 1 tablet by mouth daily.  . frovatriptan (FROVA) 2.5 MG tablet Take 2.5 mg by mouth daily as needed.   . hyoscyamine (LEVSIN SL) 0.125 MG SL tablet Take one every 8 hours as needed  . ketamine (KETALAR) 10 MG/ML injection Inject into the vein every 3 (three) months.   . Multiple Vitamin (MULTIVITAMIN) capsule Take 1 capsule by mouth daily.  . NUCYNTA 50 MG TABS tablet Take 50 mg by mouth every 4 (four) hours as needed for severe pain. Once daily   . omeprazole (PRILOSEC) 40 MG capsule TAKE 1 CAPSULE(40 MG) BY MOUTH DAILY  . Oxcarbazepine (TRILEPTAL) 300 MG tablet Take 300 mg by mouth 2 (two) times daily.  . promethazine (PHENERGAN) 25 MG tablet Take 1 tablet (25 mg total) by mouth every 8 (eight) hours as needed for nausea or vomiting.  . rosuvastatin (CRESTOR) 20 MG tablet TAKE 1 TABLET(20 MG) BY MOUTH DAILY  . sertraline (ZOLOFT) 100 MG tablet TAKE 1.5 TABLETS BY MOUTH DAILY.  Marland Kitchen tiZANidine (ZANAFLEX) 4 MG tablet Take 4 mg by mouth every 6 (six) hours as needed.   No facility-administered encounter medications on file as of 11/02/2019.      Activities of Daily Living No flowsheet data found.  Patient Care Team: Hali Marry, MD as PCP - General (Family Medicine) Elmarie Shiley, MD as Consulting Physician (Nephrology) Lucia Bitter., MD as Consulting Physician (Pain Medicine)    Assessment:   This is a routine wellness examination for  Anquanette.Physical assessment deferred to PCP.   Exercise Activities and Dietary recommendations   Diet  Breakfast: Lunch:  Dinner:       Goals    . Weight (lb) < 200 lb (90.7 kg)     Wants to loose 20-40 lbs in the next year. Start back exercising and drink more water.       Fall Risk Fall Risk  10/28/2019 10/27/2018 07/12/2016 12/21/2014 11/19/2014  Falls in the past year? 0 Yes No No No  Number falls in past yr: 0 1 - - -  Injury with Fall? 0 Yes - - -  Risk Factor Category  - High Fall Risk - - -  Risk for fall due to : - Impaired mobility;Impaired balance/gait - - -  Follow up - Falls prevention discussed - - -   Is the patient's home free of loose throw rugs in walkways, pet beds, electrical cords, etc?   {Blank single:19197::"yes","no"}      Grab bars in the bathroom? {Blank single:19197::"yes","no"}      Handrails on the stairs?   {Blank single:19197::"yes","no"}      Adequate lighting?   {Blank single:19197::"yes","no"}   Depression Screen PHQ 2/9 Scores 10/28/2019 03/09/2019 10/27/2018 01/15/2018  PHQ - 2 Score 4 2 0 1  PHQ- 9 Score 12 4 - 5     Cognitive Function     6CIT Screen 10/27/2018  What Year? 0 points  What month? 0 points  What time? 0 points  Count back from 20 0 points  Months in reverse 0 points  Repeat phrase 0 points  Total Score 0    Immunization History  Administered Date(s) Administered  . Influenza Split 11/25/2005, 12/25/2006, 10/20/2007, 09/26/2008  . Influenza,inj,Quad PF,6+ Mos 08/17/2014, 08/26/2015, 08/20/2016, 01/15/2018, 10/27/2018  . Influenza-Unspecified 09/30/2013  . Td 08/10/2010    Screening Tests Health  Maintenance  Topic Date Due  . MAMMOGRAM  03/06/2019  . INFLUENZA VACCINE  03/30/2020 (Originally 08/01/2019)  . HIV Screening  10/27/2020 (Originally [birth year]/1981)  . TETANUS/TDAP  08/10/2020  . PAP SMEAR-Modifier  07/12/2021  . COLONOSCOPY  12/07/2021        Plan:   ***   I have personally reviewed and noted the following in the patient's chart:   . Medical and social history . Use of alcohol, tobacco or illicit drugs  . Current medications and supplements . Functional ability and status . Nutritional status . Physical activity . Advanced directives . List of other physicians . Hospitalizations, surgeries, and ER visits in previous 12 months . Vitals . Screenings to include cognitive, depression, and falls . Referrals and appointments  In addition, I have reviewed and discussed with patient certain preventive protocols, quality metrics, and best practice recommendations. A written personalized care plan for preventive services as well as general preventive health recommendations were provided to patient.     Joanne Chars, LPN  [id number]

## 2019-11-02 ENCOUNTER — Ambulatory Visit: Payer: Medicare Other

## 2019-11-11 DIAGNOSIS — G501 Atypical facial pain: Secondary | ICD-10-CM | POA: Diagnosis not present

## 2019-11-11 DIAGNOSIS — G894 Chronic pain syndrome: Secondary | ICD-10-CM | POA: Diagnosis not present

## 2019-12-09 ENCOUNTER — Telehealth: Payer: Self-pay

## 2019-12-09 DIAGNOSIS — N912 Amenorrhea, unspecified: Secondary | ICD-10-CM

## 2019-12-09 DIAGNOSIS — N1831 Chronic kidney disease, stage 3a: Secondary | ICD-10-CM

## 2019-12-09 NOTE — Telephone Encounter (Signed)
Labs ordered.  She can go at her convenience.  Does not need to fast.

## 2019-12-09 NOTE — Telephone Encounter (Signed)
Desiree Wilson left a message wanting to have her hormone levels checked. She has been off birth control for a month.

## 2019-12-10 NOTE — Telephone Encounter (Signed)
Patient advised.

## 2019-12-22 DIAGNOSIS — G894 Chronic pain syndrome: Secondary | ICD-10-CM | POA: Diagnosis not present

## 2019-12-22 DIAGNOSIS — G501 Atypical facial pain: Secondary | ICD-10-CM | POA: Diagnosis not present

## 2020-01-05 DIAGNOSIS — H5213 Myopia, bilateral: Secondary | ICD-10-CM | POA: Diagnosis not present

## 2020-01-13 DIAGNOSIS — G500 Trigeminal neuralgia: Secondary | ICD-10-CM | POA: Diagnosis not present

## 2020-01-13 DIAGNOSIS — G501 Atypical facial pain: Secondary | ICD-10-CM | POA: Diagnosis not present

## 2020-01-13 DIAGNOSIS — M7918 Myalgia, other site: Secondary | ICD-10-CM | POA: Diagnosis not present

## 2020-01-13 DIAGNOSIS — G44209 Tension-type headache, unspecified, not intractable: Secondary | ICD-10-CM | POA: Diagnosis not present

## 2020-01-15 ENCOUNTER — Other Ambulatory Visit (HOSPITAL_BASED_OUTPATIENT_CLINIC_OR_DEPARTMENT_OTHER): Payer: Self-pay | Admitting: Dentist

## 2020-01-15 DIAGNOSIS — G500 Trigeminal neuralgia: Secondary | ICD-10-CM

## 2020-01-15 DIAGNOSIS — G510 Bell's palsy: Secondary | ICD-10-CM

## 2020-01-15 DIAGNOSIS — G501 Atypical facial pain: Secondary | ICD-10-CM

## 2020-01-15 DIAGNOSIS — S0451XS Injury of facial nerve, right side, sequela: Secondary | ICD-10-CM

## 2020-01-19 ENCOUNTER — Ambulatory Visit (INDEPENDENT_AMBULATORY_CARE_PROVIDER_SITE_OTHER): Payer: Medicare Other | Admitting: Family Medicine

## 2020-01-19 ENCOUNTER — Other Ambulatory Visit: Payer: Self-pay

## 2020-01-19 DIAGNOSIS — N1831 Chronic kidney disease, stage 3a: Secondary | ICD-10-CM | POA: Diagnosis not present

## 2020-01-19 DIAGNOSIS — N912 Amenorrhea, unspecified: Secondary | ICD-10-CM | POA: Diagnosis not present

## 2020-01-19 DIAGNOSIS — G44209 Tension-type headache, unspecified, not intractable: Secondary | ICD-10-CM | POA: Diagnosis not present

## 2020-01-19 DIAGNOSIS — Z23 Encounter for immunization: Secondary | ICD-10-CM | POA: Diagnosis not present

## 2020-01-19 DIAGNOSIS — G501 Atypical facial pain: Secondary | ICD-10-CM | POA: Diagnosis not present

## 2020-01-19 DIAGNOSIS — G510 Bell's palsy: Secondary | ICD-10-CM | POA: Diagnosis not present

## 2020-01-20 DIAGNOSIS — S0451XS Injury of facial nerve, right side, sequela: Secondary | ICD-10-CM | POA: Diagnosis not present

## 2020-01-20 DIAGNOSIS — Z9889 Other specified postprocedural states: Secondary | ICD-10-CM | POA: Diagnosis not present

## 2020-01-20 DIAGNOSIS — G500 Trigeminal neuralgia: Secondary | ICD-10-CM | POA: Diagnosis not present

## 2020-01-20 DIAGNOSIS — R519 Headache, unspecified: Secondary | ICD-10-CM | POA: Diagnosis not present

## 2020-01-20 DIAGNOSIS — G501 Atypical facial pain: Secondary | ICD-10-CM | POA: Diagnosis not present

## 2020-01-20 LAB — BASIC METABOLIC PANEL WITH GFR
BUN: 17 mg/dL (ref 7–25)
CO2: 26 mmol/L (ref 20–32)
Calcium: 9.2 mg/dL (ref 8.6–10.4)
Chloride: 99 mmol/L (ref 98–110)
Creat: 1 mg/dL (ref 0.50–1.05)
GFR, Est African American: 74 mL/min/{1.73_m2} (ref >=60)
GFR, Est Non African American: 64 mL/min/{1.73_m2} (ref >=60)
Glucose, Bld: 101 mg/dL — ABNORMAL HIGH (ref 65–99)
Potassium: 4.7 mmol/L (ref 3.5–5.3)
Sodium: 133 mmol/L — ABNORMAL LOW (ref 135–146)

## 2020-01-20 LAB — MICROALBUMIN / CREATININE URINE RATIO
Creatinine, Urine: 120 mg/dL (ref 20–275)
Microalb, Ur: <0.2 mg/dL

## 2020-01-20 LAB — PROGESTERONE: Progesterone: <0.5 ng/mL

## 2020-01-20 LAB — FOLLICLE STIMULATING HORMONE: FSH: 73.6 m[IU]/mL

## 2020-01-20 LAB — ESTRADIOL: Estradiol: 55 pg/mL

## 2020-01-20 LAB — LUTEINIZING HORMONE: LH: 64.5 m[IU]/mL

## 2020-01-20 LAB — TSH: TSH: 1.21 mIU/L

## 2020-01-25 DIAGNOSIS — R6884 Jaw pain: Secondary | ICD-10-CM | POA: Diagnosis not present

## 2020-01-29 DIAGNOSIS — R6884 Jaw pain: Secondary | ICD-10-CM | POA: Diagnosis not present

## 2020-02-03 DIAGNOSIS — G894 Chronic pain syndrome: Secondary | ICD-10-CM | POA: Diagnosis not present

## 2020-02-03 DIAGNOSIS — G501 Atypical facial pain: Secondary | ICD-10-CM | POA: Diagnosis not present

## 2020-02-04 DIAGNOSIS — R6884 Jaw pain: Secondary | ICD-10-CM | POA: Diagnosis not present

## 2020-02-09 DIAGNOSIS — R6884 Jaw pain: Secondary | ICD-10-CM | POA: Diagnosis not present

## 2020-02-11 DIAGNOSIS — R6884 Jaw pain: Secondary | ICD-10-CM | POA: Diagnosis not present

## 2020-02-16 DIAGNOSIS — G501 Atypical facial pain: Secondary | ICD-10-CM | POA: Diagnosis not present

## 2020-02-19 DIAGNOSIS — R6884 Jaw pain: Secondary | ICD-10-CM | POA: Diagnosis not present

## 2020-02-24 DIAGNOSIS — R6884 Jaw pain: Secondary | ICD-10-CM | POA: Diagnosis not present

## 2020-03-02 DIAGNOSIS — R6884 Jaw pain: Secondary | ICD-10-CM | POA: Diagnosis not present

## 2020-03-11 DIAGNOSIS — R6884 Jaw pain: Secondary | ICD-10-CM | POA: Diagnosis not present

## 2020-03-13 DIAGNOSIS — B029 Zoster without complications: Secondary | ICD-10-CM | POA: Diagnosis not present

## 2020-03-13 DIAGNOSIS — R21 Rash and other nonspecific skin eruption: Secondary | ICD-10-CM | POA: Diagnosis not present

## 2020-03-14 ENCOUNTER — Telehealth: Payer: Self-pay

## 2020-03-14 NOTE — Telephone Encounter (Signed)
Desiree Wilson was diagnosed with shingles yesterday at [facility]. She was prescribed an anti-viral and Gabapentin 600 mg tablets to take every six hours. She took the first 600 mg of Gabapentin this morning. She states she later started feeling lightheaded and had to sit down before she fell. She wanted to know if maybe she shouldn't take the Gabapentin with the other medications. Please advise.    [facility] Address: [address] [place], [place] [address] Phone: [phone]

## 2020-03-14 NOTE — Telephone Encounter (Signed)
I would definitely take the antiviral but she can certainly stop the gabapentin.  That dose is actually quite high.  If she wants to retry it later she can always break it in half and try that first.  The 600 mg off the bat is a fairly high dose and can cause lightheadedness dizziness, and sedation.

## 2020-03-15 NOTE — Telephone Encounter (Signed)
Left message advising of recommendations.  

## 2020-03-23 ENCOUNTER — Other Ambulatory Visit: Payer: Self-pay | Admitting: Family Medicine

## 2020-03-23 DIAGNOSIS — Z1231 Encounter for screening mammogram for malignant neoplasm of breast: Secondary | ICD-10-CM

## 2020-04-06 ENCOUNTER — Other Ambulatory Visit: Payer: Self-pay

## 2020-04-06 ENCOUNTER — Ambulatory Visit (INDEPENDENT_AMBULATORY_CARE_PROVIDER_SITE_OTHER): Payer: Medicare Other

## 2020-04-06 DIAGNOSIS — Z1231 Encounter for screening mammogram for malignant neoplasm of breast: Secondary | ICD-10-CM | POA: Diagnosis not present

## 2020-04-07 ENCOUNTER — Telehealth (INDEPENDENT_AMBULATORY_CARE_PROVIDER_SITE_OTHER): Payer: Medicare Other | Admitting: Nurse Practitioner

## 2020-04-07 ENCOUNTER — Encounter: Payer: Self-pay | Admitting: Nurse Practitioner

## 2020-04-07 VITALS — Ht 65.0 in | Wt 217.0 lb

## 2020-04-07 DIAGNOSIS — F17210 Nicotine dependence, cigarettes, uncomplicated: Secondary | ICD-10-CM

## 2020-04-07 DIAGNOSIS — Z716 Tobacco abuse counseling: Secondary | ICD-10-CM

## 2020-04-07 MED ORDER — BUPROPION HCL ER (SR) 150 MG PO TB12: 150.0000 mg | ORAL_TABLET | Freq: Two times a day (BID) | ORAL | 1 refills | Status: DC

## 2020-04-07 NOTE — Progress Notes (Signed)
Virtual Visit via MyChart Note  I connected with  [name] on 04/07/20 at  8:50 AM EDT by the video enabled telemedicine application, MyChart, and verified that I am speaking with the correct person using two identifiers.   I introduced myself as a Designer, jewellery with the practice. We discussed the limitations of evaluation and management by telemedicine and the availability of in person appointments. The patient expressed understanding and agreed to proceed.  The patient is: at home I am: in the office  Subjective:    CC: Would like to quit smoking   HPI: Desiree Wilson is a 54 y.o. y/o female presenting via [place] today for assistance with smoking cessation. She and her husband have made the decision to stop smoking together to improve their health. Her husband was recently prescribed bupropion by his PCP and she is interested in starting this, as well. She is a current 1PPD smoker and quit smoking once before, but started back about 10 years ago. She is eager and motivated to quit. She has made a plan to make cinnamon flavored toothpicks to help with the hand to mouth habit in addition to sugar free gum to help with the oral satisfaction.   She does not have a history of seizures or seizure activity.   Past medical history, Surgical history, Family history not pertinant except as noted below, Social history, Allergies, and medications have been entered into the medical record, reviewed, and corrections made.   Review of Systems:  Pertinent positive and negatives listed in HPI.   Objective:    General: Speaking clearly in complete sentences without any shortness of breath.  Alert and oriented x3.  Normal judgment. No apparent acute distress.  Impression and Recommendations:   1. Encounter for smoking cessation counseling Eager and motivated patient interested in smoking cessation information and utilization of bupropion to help with this endeavor.  Discussed the  titration method of medication as well as ways to help with the hand to mouth habit. Also discussed OTC nicotine replacement aids that may be used to help work through the habitual nature of smoking.  Suggestions for 12 week taper provided with specific instructions. Smoking cessation information also provided. Patient praised for taking this step and working toward a healthier lifestyle.  Script of bupropion sent to pharmacy.    Follow-up in about 3 months with PCP for evaluation of effectiveness of treatment or sooner if needed.  - buPROPion (WELLBUTRIN SR) 150 MG 12 hr tablet; Take 1 tablet (150 mg total) by mouth 2 (two) times daily. Take 1 tab (150 mg) by mouth once a day for the first 4 days then increase to 1 tab (150 mg) by mouth twice a day.  Dispense: 60 tablet; Refill: 1  I discussed the assessment and treatment plan with the patient. The patient was provided an opportunity to ask questions and all were answered. The patient agreed with the plan and demonstrated an understanding of the instructions.   The patient was advised to call back or seek an in-person evaluation if the symptoms worsen or if the condition fails to improve as anticipated.  I provided 20 minutes of non-face-to-face interaction with this [place] visit.   Orma Render, NP

## 2020-04-07 NOTE — Progress Notes (Signed)
Patient was able to quit a few years back and restarted about 10 years ago.  Patient smokes 1 pack per day.

## 2020-04-07 NOTE — Patient Instructions (Signed)
Start the bupropion (Wellbutrin) 150mg  once a day for the first 4 days then increase to 150mg  twice a day for the remainder of the treatment period.   2 weeks from the start date of your medication, start to taper your smoking.  Goal #1 should be to decrease the number of cigarettes you smoke a day by 50% by week 4.  Goal #2 should be to decrease the number of cigarettes you smoke a day by an additional 50% by week 8. Goal #3 should be to stop smoking completely by week 12.  I am SO PROUD of you for making this choice for your health. YOU CAN DO THIS!!! Let me know if you have any questions along the way.    Bupropion sustained-release tablets (smoking cessation) What is this medicine? BUPROPION (byoo PROE pee on) is used to help people quit smoking. This medicine may be used for other purposes; ask your health care provider or pharmacist if you have questions. COMMON BRAND NAME(S): Buproban, Zyban What should I tell my health care provider before I take this medicine? They need to know if you have any of these conditions:  an eating disorder, such as anorexia or bulimia  bipolar disorder or psychosis  diabetes or high blood sugar, treated with medication  glaucoma  head injury or brain tumor  heart disease, previous heart attack, or irregular heart beat  high blood pressure  kidney or liver disease  seizures  suicidal thoughts or a previous suicide attempt  Tourette's syndrome  weight loss  an unusual or allergic reaction to bupropion, other medicines, foods, dyes, or preservatives  breast-feeding  pregnant or trying to become pregnant How should I use this medicine? Take this medicine by mouth with a glass of water. Follow the directions on the prescription label. You can take it with or without food. If it upsets your stomach, take it with food. Do not cut, crush or chew this medicine. Take your medicine at regular intervals. If you take this medicine more than  once a day, take your second dose at least 8 hours after you take your first dose. To limit difficulty in sleeping, avoid taking this medicine at bedtime. Do not take your medicine more often than directed. Do not stop taking this medicine suddenly except upon the advice of your doctor. Stopping this medicine too quickly may cause serious side effects. A special MedGuide will be given to you by the pharmacist with each prescription and refill. Be sure to read this information carefully each time. Talk to your pediatrician regarding the use of this medicine in children. Special care may be needed. Overdosage: If you think you have taken too much of this medicine contact a poison control center or emergency room at once. NOTE: This medicine is only for you. Do not share this medicine with others. What if I miss a dose? If you miss a dose, skip the missed dose and take your next tablet at the regular time. There should be at least 8 hours between doses. Do not take double or extra doses. What may interact with this medicine? Do not take this medicine with any of the following medications:  linezolid  MAOIs like Azilect, Carbex, Eldepryl, Marplan, Nardil, and Parnate  methylene blue (injected into a vein)  other medicines that contain bupropion like Wellbutrin This medicine may also interact with the following medications:  alcohol  certain medicines for anxiety or sleep  certain medicines for blood pressure like metoprolol, propranolol  certain  medicines for depression or psychotic disturbances  certain medicines for HIV or AIDS like efavirenz, lopinavir, nelfinavir, ritonavir  certain medicines for irregular heart beat like propafenone, flecainide  certain medicines for Parkinson's disease like amantadine, levodopa  certain medicines for seizures like carbamazepine, phenytoin,  phenobarbital  cimetidine  clopidogrel  cyclophosphamide  digoxin  furazolidone  isoniazid  nicotine  orphenadrine  procarbazine  steroid medicines like prednisone or cortisone  stimulant medicines for attention disorders, weight loss, or to stay awake  tamoxifen  theophylline  thiotepa  ticlopidine  tramadol  warfarin This list may not describe all possible interactions. Give your health care provider a list of all the medicines, herbs, non-prescription drugs, or dietary supplements you use. Also tell them if you smoke, drink alcohol, or use illegal drugs. Some items may interact with your medicine. What should I watch for while using this medicine? Visit your doctor or healthcare provider for regular checks on your progress. This medicine should be used together with a patient support program. It is important to participate in a behavioral program, counseling, or other support program that is recommended by your healthcare provider. This medicine may cause serious skin reactions. They can happen weeks to months after starting the medicine. Contact your healthcare provider right away if you notice fevers or flu-like symptoms with a rash. The rash may be red or purple and then turn into blisters or peeling of the skin. Or, you might notice a red rash with swelling of the face, lips or lymph nodes in your neck or under your arms. Patients and their families should watch out for new or worsening thoughts of suicide or depression. Also watch out for sudden changes in feelings such as feeling anxious, agitated, panicky, irritable, hostile, aggressive, impulsive, severely restless, overly excited and hyperactive, or not being able to sleep. If this happens, especially at the beginning of treatment or after a change in dose, call your healthcare provider. Avoid alcoholic drinks while taking this medicine. Drinking excessive alcoholic beverages, using sleeping or anxiety medicines,  or quickly stopping the use of these agents while taking this medicine may increase your risk for a seizure. Do not drive or use heavy machinery until you know how this medicine affects you. This medicine can impair your ability to perform these tasks. Do not take this medicine close to bedtime. It may prevent you from sleeping. Your mouth may get dry. Chewing sugarless gum or sucking hard candy, and drinking plenty of water may help. Contact your doctor if the problem does not go away or is severe. Do not use nicotine patches or chewing gum without the advice of your doctor or healthcare provider while taking this medicine. You may need to have your blood pressure taken regularly if your doctor recommends that you use both nicotine and this medicine together. What side effects may I notice from receiving this medicine? Side effects that you should report to your doctor or health care professional as soon as possible:  allergic reactions like skin rash, itching or hives, swelling of the face, lips, or tongue  breathing problems  changes in vision  confusion  elevated mood, decreased need for sleep, racing thoughts, impulsive behavior  fast or irregular heartbeat  hallucinations, loss of contact with reality  increased blood pressure  rash, fever, and swollen lymph nodes  redness, blistering, peeling, or loosening of the skin, including inside the mouth  seizures  suicidal thoughts or other mood changes  unusually weak or tired  vomiting Side  effects that usually do not require medical attention (report to your doctor or health care professional if they continue or are bothersome):  constipation  headache  loss of appetite  nausea  tremors  weight loss This list may not describe all possible side effects. Call your doctor for medical advice about side effects. You may report side effects to FDA at 1-800-FDA-1088. Where should I keep my medicine? Keep out of the reach  of children. Store at room temperature between 20 and 25 degrees C (68 and 77 degrees F). Protect from light. Keep container tightly closed. Throw away any unused medicine after the expiration date. NOTE: This sheet is a summary. It may not cover all possible information. If you have questions about this medicine, talk to your doctor, pharmacist, or health care provider.  2020 Elsevier/Gold Standard (2019-03-12 13:59:09)   Steps to Quit Smoking Smoking tobacco is the leading cause of preventable death. It can affect almost every organ in the body. Smoking puts you and people around you at risk for many serious, long-lasting (chronic) diseases. Quitting smoking can be hard, but it is one of the best things that you can do for your health. It is never too late to quit. How do I get ready to quit? When you decide to quit smoking, make a plan to help you succeed. Before you quit:  Pick a date to quit. Set a date within the next 2 weeks to give you time to prepare.  Write down the reasons why you are quitting. Keep this list in places where you will see it often.  Tell your family, friends, and co-workers that you are quitting. Their support is important.  Talk with your doctor about the choices that may help you quit.  Find out if your health insurance will pay for these treatments.  Know the people, places, things, and activities that make you want to smoke (triggers). Avoid them. What first steps can I take to quit smoking?  Throw away all cigarettes at home, at work, and in your car.  Throw away the things that you use when you smoke, such as ashtrays and lighters.  Clean your car. Make sure to empty the ashtray.  Clean your home, including curtains and carpets. What can I do to help me quit smoking? Talk with your doctor about taking medicines and seeing a counselor at the same time. You are more likely to succeed when you do both.  If you are pregnant or breastfeeding, talk with  your doctor about counseling or other ways to quit smoking. Do not take medicine to help you quit smoking unless your doctor tells you to do so. To quit smoking: Quit right away  Quit smoking totally, instead of slowly cutting back on how much you smoke over a period of time.  Go to counseling. You are more likely to quit if you go to counseling sessions regularly. Take medicine You may take medicines to help you quit. Some medicines need a prescription, and some you can buy over-the-counter. Some medicines may contain a drug called nicotine to replace the nicotine in cigarettes. Medicines may:  Help you to stop having the desire to smoke (cravings).  Help to stop the problems that come when you stop smoking (withdrawal symptoms). Your doctor may ask you to use:  Nicotine patches, gum, or lozenges.  Nicotine inhalers or sprays.  Non-nicotine medicine that is taken by mouth. Find resources Find resources and other ways to help you quit smoking and  remain smoke-free after you quit. These resources are most helpful when you use them often. They include:  Online chats with a Social worker.  Phone quitlines.  Printed Furniture conservator/restorer.  Support groups or group counseling.  Text messaging programs.  Mobile phone apps. Use apps on your mobile phone or tablet that can help you stick to your quit plan. There are many free apps for mobile phones and tablets as well as websites. Examples include Quit Guide from the [employer] and smokefree.gov  What things can I do to make it easier to quit?   Talk to your family and friends. Ask them to support and encourage you.  Call a phone quitline (1-800-QUIT-NOW), reach out to support groups, or work with a Social worker.  Ask people who smoke to not smoke around you.  Avoid places that make you want to smoke, such as: ? Bars. ? Parties. ? Smoke-break areas at work.  Spend time with people who do not smoke.  Lower the stress in your life. Stress can  make you want to smoke. Try these things to help your stress: ? Getting regular exercise. ? Doing deep-breathing exercises. ? Doing yoga. ? Meditating. ? Doing a body scan. To do this, close your eyes, focus on one area of your body at a time from head to toe. Notice which parts of your body are tense. Try to relax the muscles in those areas. How will I feel when I quit smoking? Day 1 to 3 weeks Within the first 24 hours, you may start to have some problems that come from quitting tobacco. These problems are very bad 2-3 days after you quit, but they do not often last for more than 2-3 weeks. You may get these symptoms:  Mood swings.  Feeling restless, nervous, angry, or annoyed.  Trouble concentrating.  Dizziness.  Strong desire for high-sugar foods and nicotine.  Weight gain.  Trouble pooping (constipation).  Feeling like you may vomit (nausea).  Coughing or a sore throat.  Changes in how the medicines that you take for other issues work in your body.  Depression.  Trouble sleeping (insomnia). Week 3 and afterward After the first 2-3 weeks of quitting, you may start to notice more positive results, such as:  Better sense of smell and taste.  Less coughing and sore throat.  Slower heart rate.  Lower blood pressure.  Clearer skin.  Better breathing.  Fewer sick days. Quitting smoking can be hard. Do not give up if you fail the first time. Some people need to try a few times before they succeed. Do your best to stick to your quit plan, and talk with your doctor if you have any questions or concerns. Summary  Smoking tobacco is the leading cause of preventable death. Quitting smoking can be hard, but it is one of the best things that you can do for your health.  When you decide to quit smoking, make a plan to help you succeed.  Quit smoking right away, not slowly over a period of time.  When you start quitting, seek help from your doctor, family, or  friends. This information is not intended to replace advice given to you by your health care provider. Make sure you discuss any questions you have with your health care provider. Document Revised: 09/11/2019 Document Reviewed: 03/07/2019 Elsevier Patient Education  [place].

## 2020-04-12 DIAGNOSIS — R590 Localized enlarged lymph nodes: Secondary | ICD-10-CM | POA: Diagnosis not present

## 2020-04-12 DIAGNOSIS — G500 Trigeminal neuralgia: Secondary | ICD-10-CM | POA: Diagnosis not present

## 2020-04-12 DIAGNOSIS — M7918 Myalgia, other site: Secondary | ICD-10-CM | POA: Diagnosis not present

## 2020-04-12 DIAGNOSIS — G501 Atypical facial pain: Secondary | ICD-10-CM | POA: Diagnosis not present

## 2020-04-12 DIAGNOSIS — R1310 Dysphagia, unspecified: Secondary | ICD-10-CM | POA: Diagnosis not present

## 2020-04-12 DIAGNOSIS — S0451XS Injury of facial nerve, right side, sequela: Secondary | ICD-10-CM | POA: Diagnosis not present

## 2020-04-12 DIAGNOSIS — R0989 Other specified symptoms and signs involving the circulatory and respiratory systems: Secondary | ICD-10-CM | POA: Diagnosis not present

## 2020-04-12 DIAGNOSIS — K117 Disturbances of salivary secretion: Secondary | ICD-10-CM | POA: Diagnosis not present

## 2020-04-12 DIAGNOSIS — G44209 Tension-type headache, unspecified, not intractable: Secondary | ICD-10-CM | POA: Diagnosis not present

## 2020-04-12 DIAGNOSIS — G510 Bell's palsy: Secondary | ICD-10-CM | POA: Diagnosis not present

## 2020-04-12 DIAGNOSIS — Z87891 Personal history of nicotine dependence: Secondary | ICD-10-CM | POA: Diagnosis not present

## 2020-04-20 DIAGNOSIS — R6884 Jaw pain: Secondary | ICD-10-CM | POA: Diagnosis not present

## 2020-04-26 DIAGNOSIS — G894 Chronic pain syndrome: Secondary | ICD-10-CM | POA: Diagnosis not present

## 2020-04-27 ENCOUNTER — Other Ambulatory Visit: Payer: Self-pay

## 2020-04-27 ENCOUNTER — Encounter: Payer: Self-pay | Admitting: Family Medicine

## 2020-04-27 ENCOUNTER — Ambulatory Visit: Payer: Medicare Other | Admitting: Family Medicine

## 2020-04-27 VITALS — BP 130/74 | HR 80 | Ht 65.0 in | Wt 215.0 lb

## 2020-04-27 DIAGNOSIS — B079 Viral wart, unspecified: Secondary | ICD-10-CM | POA: Diagnosis not present

## 2020-04-27 DIAGNOSIS — F5101 Primary insomnia: Secondary | ICD-10-CM

## 2020-04-27 DIAGNOSIS — B351 Tinea unguium: Secondary | ICD-10-CM

## 2020-04-27 DIAGNOSIS — G500 Trigeminal neuralgia: Secondary | ICD-10-CM

## 2020-04-27 DIAGNOSIS — Z13220 Encounter for screening for lipoid disorders: Secondary | ICD-10-CM

## 2020-04-27 DIAGNOSIS — Z8619 Personal history of other infectious and parasitic diseases: Secondary | ICD-10-CM

## 2020-04-27 DIAGNOSIS — F3341 Major depressive disorder, recurrent, in partial remission: Secondary | ICD-10-CM

## 2020-04-27 DIAGNOSIS — N1831 Chronic kidney disease, stage 3a: Secondary | ICD-10-CM

## 2020-04-27 DIAGNOSIS — F5104 Psychophysiologic insomnia: Secondary | ICD-10-CM

## 2020-04-27 MED ORDER — TERBINAFINE HCL 250 MG PO TABS: 250.0000 mg | ORAL_TABLET | Freq: Every day | ORAL | 1 refills | Status: DC

## 2020-04-27 MED ORDER — TRAZODONE HCL 100 MG PO TABS: 200.0000 mg | ORAL_TABLET | Freq: Every evening | ORAL | 1 refills | Status: DC | PRN

## 2020-04-27 NOTE — Patient Instructions (Signed)
We will try the oral Lamisil for your toenails.  But after 1 month on the medication we will need to recheck your liver enzymes.

## 2020-04-27 NOTE — Progress Notes (Signed)
Established Patient Office Visit  Subjective:  Patient ID: Desiree Wilson, female    DOB: January 15, 1966  Age: 54 y.o. MRN: [id number]  CC:  Chief Complaint  Patient presents with  . Follow-up    HPI   ICLE MCCLARD presents for   49-month follow-up.  She recently saw my partners for smoking cessation.  She actually dropped down to 15 cigarettes daily on Monday and is, gradually taper down over 12 weeks she is also on Wellbutrin and and thus far is doing well with that she is noted she has not wanted to smoke nearly as much in the afternoons.  Her father has been very ill and has been having some difficulty of the last year or so that has increased her stress levels which sometimes does make her want to smoke a little bit more often.  Trigeminal neuralgia-she still getting her ketamine infusions.  More recently has actually started physical therapy for this and says it actually is been really helpful.  Insomnia-she is not been sleeping well so restarted an old sleep aid called trazodone that we had prescribed for her maybe 4 years ago.  She is restarted the 100 mg tab and says is actually been helpful.  She actually spoke with one of her other physicians who warned her not to take it with the amitriptyline so she has actually discontinued her amitriptyline.  Wanted to let me know that back in March she had a shingles outbreak on her right breast.  The rash lasted about 4 weeks and then resolved on its own she will occasionally get pain on the side of the breast and the nipple  She also has a wart on the posterior left hand that she would like to have treated she has been doing the home freezing kits.  She also wanted to ask about several dystrophic nails on her feet and whether or not she needed antifungal treatment.  Past Medical History:  Diagnosis Date  . Allergy    rhinitis  . Anemia   . Blood transfusion    x2 1983 surgery blood loss  . Blood transfusion without reported  diagnosis   . Depression   . Facial paralysis    right face/ chronic pain syndrome (pain management Dr Sydell Axon)  . Headache(784.0)   . Hyperlipidemia   . Hypotension   . Iron deficiency anemia 11/15/2014  . Iron malabsorption 11/15/2014  . Osteoarthritis   . Trigeminal neuralgia    right  . Trigeminal neuralgia 2002    Past Surgical History:  Procedure Laterality Date  . gamma knife Right 01/2018  . MANDIBLE SURGERY    . OSTEOTOMY    . 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 Cancer Mother   . Stroke Father   . Diverticulitis Father   . Colon cancer Neg Hx     Social History   Socioeconomic History  . Marital status: Single    Spouse name: [name]  . Number of children: 0  . Years of education: Masters Degree  . Highest education level: Master's degree (e.g., MA, MS, MEng, MEd, MSW, MBA)  Occupational History  . Occupation: disabled  Tobacco Use  . Smoking status: Current Some Day Smoker    Years: 6.00    Types: Cigarettes    Last attempt to quit: 12/31/2000    Years since quitting: 19.3  .  Smokeless tobacco: Never Used  . Tobacco comment: tobacco infor given  Substance and Sexual Activity  . Alcohol use: No    Alcohol/week: 0.0 standard drinks  . Drug use: No  . Sexual activity: Yes    Partners: Male  Other Topics Concern  . Not on file  Social History Narrative   Wants to start back exercising. Goal is to loose 20 to 40 lbs in the next year. Coffee in the morning and tea during the day   Social Determinants of Health   Financial Resource Strain:   . Difficulty of Paying Living Expenses:   Food Insecurity:   . Worried About Charity fundraiser in the Last Year:   . Arboriculturist in the Last Year:   Transportation Needs:   . Film/video editor (Medical):   Marland Kitchen Lack of Transportation (Non-Medical):   Physical Activity:   . Days of Exercise  per Week:   . Minutes of Exercise per Session:   Stress:   . Feeling of Stress :   Social Connections:   . Frequency of Communication with Friends and Family:   . Frequency of Social Gatherings with Friends and Family:   . Attends Religious Services:   . Active Member of Clubs or Organizations:   . Attends Archivist Meetings:   Marland Kitchen Marital Status:   Intimate Partner Violence:   . Fear of Current or Ex-Partner:   . Emotionally Abused:   Marland Kitchen Physically Abused:   . Sexually Abused:     Outpatient Medications Prior to Visit  Medication Sig Dispense Refill  . gabapentin (NEURONTIN) 600 MG tablet Take 600 mg by mouth 3 (three) times daily.    . ARIPiprazole (ABILIFY) 2 MG tablet TAKE 1 TABLET BY MOUTH DAILY 90 tablet 0  . aspirin-acetaminophen-caffeine (EXCEDRIN MIGRAINE) [id number] MG tablet Take by mouth.    Marland Kitchen buPROPion (WELLBUTRIN SR) 150 MG 12 hr tablet Take 1 tablet (150 mg total) by mouth 2 (two) times daily. Take 1 tab (150 mg) by mouth once a day for the first 4 days then increase to 1 tab (150 mg) by mouth twice a day. 60 tablet 1  . clonazePAM (KLONOPIN) 1 MG tablet Take 1 tablet (1 mg total) by mouth 2 (two) times daily. 30 tablet 2  . diphenoxylate-atropine (LOMOTIL) 2.5-0.025 MG tablet TAKE 1 TABLET BY MOUTH FOUR TIMES DAILY AS NEEDED FOR DIARRHEA OR LOOSE STOOL 30 tablet 4  . frovatriptan (FROVA) 2.5 MG tablet Take 2.5 mg by mouth daily as needed.     . hyoscyamine (LEVSIN SL) 0.125 MG SL tablet Take one every 8 hours as needed 30 tablet 2  . ketamine (KETALAR) 10 MG/ML injection Inject into the vein every 3 (three) months.     . Multiple Vitamin (MULTIVITAMIN) capsule Take 1 capsule by mouth daily.    . NUCYNTA 50 MG TABS tablet Take 50 mg by mouth every 4 (four) hours as needed for severe pain. Once daily   0  . omeprazole (PRILOSEC) 40 MG capsule TAKE ONE CAPSULE BY MOUTH DAILY 90 capsule 0  . Oxcarbazepine (TRILEPTAL) 300 MG tablet Take 300 mg by mouth 2 (two) times  daily.    . promethazine (PHENERGAN) 25 MG tablet Take 1 tablet (25 mg total) by mouth every 8 (eight) hours as needed for nausea or vomiting. 20 tablet 0  . rosuvastatin (CRESTOR) 20 MG tablet TAKE 1 TABLET(20 MG) BY MOUTH DAILY 90 tablet 3  . sertraline (ZOLOFT)  100 MG tablet TAKE 1.5 TABLETS BY MOUTH DAILY. 145 tablet 1  . tiZANidine (ZANAFLEX) 4 MG tablet Take 4 mg by mouth every 6 (six) hours as needed.    Marland Kitchen amitriptyline (ELAVIL) 25 MG tablet Take 1 tablet by mouth at bedtime.     No facility-administered medications prior to visit.    Allergies  Allergen Reactions  . Azithromycin Rash    REACTION: C Diff Other reaction(s): Other (See Comments), Other (See Comments) REACTION: C Diff REACTION: C Diff REACTION: C Diff   . Methadone     Other reaction(s): Hallucinations, Other (See Comments), Other (See Comments) Hallucinations Hallucinations Hallucinations   . Tapentadol Anxiety    Other reaction(s): Dizziness  . Phenobarbital     REACTION: Excited    ROS Review of Systems    Objective:    Physical Exam  Constitutional: She is oriented to person, place, and time. She appears well-developed and well-nourished.  HENT:  Head: Normocephalic and atraumatic.  Cardiovascular: Normal rate, regular rhythm and normal heart sounds.  Pulmonary/Chest: Effort normal and breath sounds normal.  Neurological: She is alert and oriented to person, place, and time.  Skin: Skin is warm and dry.  approx 6 mm wart on the dorsum of theleft hand   Psychiatric: She has a normal mood and affect. Her behavior is normal.    BP 130/74   Pulse 80   Ht 5\' 5"  (1.651 m)   Wt 215 lb (97.5 kg)   SpO2 97%   BMI 35.78 kg/m  Wt Readings from Last 3 Encounters:  04/27/20 215 lb (97.5 kg)  04/07/20 217 lb (98.4 kg)  05/06/19 215 lb (97.5 kg)     There are no preventive care reminders to display for this patient.  There are no preventive care reminders to display for this patient.  Lab  Results  Component Value Date   TSH 1.21 01/19/2020   Lab Results  Component Value Date   WBC 10.3 05/14/2016   HGB 13.8 05/14/2016   HCT 42.0 05/14/2016   MCV 84.0 05/14/2016   PLT 176 05/14/2016   Lab Results  Component Value Date   NA 133 (L) 01/19/2020   K 4.7 01/19/2020   CO2 26 01/19/2020   GLUCOSE 101 (H) 01/19/2020   BUN 17 01/19/2020   CREATININE 1.00 01/19/2020   BILITOT 0.2 03/09/2019   ALKPHOS 94 03/05/2017   AST 13 03/09/2019   ALT 9 03/09/2019   PROT 6.5 03/09/2019   ALBUMIN 4.1 03/05/2017   CALCIUM 9.2 01/19/2020   Lab Results  Component Value Date   CHOL 215 (H) 03/09/2019   Lab Results  Component Value Date   HDL 76 03/09/2019   Lab Results  Component Value Date   LDLCALC 111 (H) 03/09/2019   Lab Results  Component Value Date   TRIG 163 (H) 03/09/2019   Lab Results  Component Value Date   CHOLHDL 2.8 03/09/2019   Lab Results  Component Value Date   HGBA1C 5.6 07/15/2017      Assessment & Plan:   Problem List Items Addressed This Visit      Nervous and Auditory   Trigeminal neuralgia    Is like she is had some great results with addition of physical therapy to her ketamine infusions.      Relevant Medications   gabapentin (NEURONTIN) 600 MG tablet   traZODone (DESYREL) 100 MG tablet     Genitourinary   CKD (chronic kidney disease) stage 3, GFR 30-59  ml/min - Primary    Following kidney function every 6 months.  Due to recheck levels.      Relevant Orders   COMPLETE METABOLIC PANEL WITH GFR   Lipid panel     Other   Depression    Doing okay overall.  PHQ-9 score of 3 and GAD-7 score of 4.  Rates her symptoms as somewhat difficult. For now we will continue with current regimen no adjustments needed.  Overall she is actually doing really well we did discuss coming up with some strategies to reduce her stress especially while dealing with some of her father's illnesses      Relevant Medications   traZODone (DESYREL) 100 MG  tablet   Chronic insomnia    Start trazodone.  New prescription sent to pharmacy.  Make sure to discontinue amitriptyline.       Other Visit Diagnoses    Primary insomnia       Relevant Medications   traZODone (DESYREL) 100 MG tablet   Viral warts, unspecified type       Relevant Medications   terbinafine (LAMISIL) 250 MG tablet   History of shingles       Screening, lipid       Relevant Orders   Lipid panel   Onychomycosis       Relevant Medications   terbinafine (LAMISIL) 250 MG tablet     Onychomycosis-discussed options.  Could be fungal versus dystrophic.  They do look fungal so we discussed option of treatment versus doing a culture.  We will go ahead and start oral Lamisil after discussion.  Will need liver enzymes rechecked in 1 month.  Hx of Shingles. - recommend gets shingles vaccine in 02/2020  Cryotherapy Procedure Note  Pre-operative Diagnosis: wart on dorsum of the Left hands.   Post-operative Diagnosis: same  Locations: Dorsum of right hand.  Indications: irritation   Anesthesia: None   Procedure Details  Patient informed of risks (permanent scarring, infection, light or dark discoloration, bleeding, infection, weakness, numbness and recurrence of the lesion) and benefits of the procedure and verbal informed consent obtained.  The areas are treated with liquid nitrogen therapy, frozen until ice ball extended 1-2 mm beyond lesion, allowed to thaw, and treated again. The patient tolerated procedure well.  The patient was instructed on post-op care, warned that there may be blister formation, redness and pain. Recommend OTC analgesia as needed for pain.  Condition: Stable  Complications: none.  Plan: 1. Instructed to keep the area dry and covered for 24-48h and clean thereafter. 2. Warning signs of infection were reviewed.   3. Recommended that the patient use OTC acetaminophen as needed for pain.  4. Return PRN.    Meds ordered this encounter   Medications  . traZODone (DESYREL) 100 MG tablet    Sig: Take 2 tablets (200 mg total) by mouth at bedtime as needed for sleep.    Dispense:  90 tablet    Refill:  1  . terbinafine (LAMISIL) 250 MG tablet    Sig: Take 1 tablet (250 mg total) by mouth daily.    Dispense:  90 tablet    Refill:  1    Follow-up: Return in about 6 months (around 10/27/2020) for Medications .    [name], MD

## 2020-04-27 NOTE — Assessment & Plan Note (Addendum)
Doing okay overall.  PHQ-9 score of 3 and GAD-7 score of 4.  Rates her symptoms as somewhat difficult. For now we will continue with current regimen no adjustments needed.  Overall she is actually doing really well we did discuss coming up with some strategies to reduce her stress especially while dealing with some of her father's illnesses

## 2020-04-27 NOTE — Assessment & Plan Note (Signed)
Start trazodone.  New prescription sent to pharmacy.  Make sure to discontinue amitriptyline.

## 2020-04-27 NOTE — Assessment & Plan Note (Signed)
Is like she is had some great results with addition of physical therapy to her ketamine infusions.

## 2020-04-27 NOTE — Assessment & Plan Note (Signed)
Following kidney function every 6 months.  Due to recheck levels.

## 2020-05-02 DIAGNOSIS — R0989 Other specified symptoms and signs involving the circulatory and respiratory systems: Secondary | ICD-10-CM | POA: Diagnosis not present

## 2020-05-02 DIAGNOSIS — R1319 Other dysphagia: Secondary | ICD-10-CM | POA: Diagnosis not present

## 2020-05-02 DIAGNOSIS — Z9889 Other specified postprocedural states: Secondary | ICD-10-CM | POA: Diagnosis not present

## 2020-05-02 DIAGNOSIS — R1310 Dysphagia, unspecified: Secondary | ICD-10-CM | POA: Diagnosis not present

## 2020-05-02 DIAGNOSIS — Z87891 Personal history of nicotine dependence: Secondary | ICD-10-CM | POA: Diagnosis not present

## 2020-05-02 DIAGNOSIS — R590 Localized enlarged lymph nodes: Secondary | ICD-10-CM | POA: Diagnosis not present

## 2020-05-05 DIAGNOSIS — R6884 Jaw pain: Secondary | ICD-10-CM | POA: Diagnosis not present

## 2020-05-11 ENCOUNTER — Other Ambulatory Visit: Payer: Self-pay | Admitting: Family Medicine

## 2020-05-16 DIAGNOSIS — R6884 Jaw pain: Secondary | ICD-10-CM | POA: Diagnosis not present

## 2020-05-20 ENCOUNTER — Other Ambulatory Visit: Payer: Self-pay | Admitting: *Deleted

## 2020-05-20 MED ORDER — SERTRALINE HCL 100 MG PO TABS: ORAL_TABLET | ORAL | 1 refills | Status: DC

## 2020-05-30 DIAGNOSIS — R6884 Jaw pain: Secondary | ICD-10-CM | POA: Diagnosis not present

## 2020-06-02 ENCOUNTER — Other Ambulatory Visit: Payer: Self-pay | Admitting: Nurse Practitioner

## 2020-06-02 DIAGNOSIS — Z716 Tobacco abuse counseling: Secondary | ICD-10-CM

## 2020-06-10 ENCOUNTER — Encounter: Payer: Self-pay | Admitting: Family Medicine

## 2020-06-10 ENCOUNTER — Ambulatory Visit (INDEPENDENT_AMBULATORY_CARE_PROVIDER_SITE_OTHER): Payer: Medicare Other | Admitting: Family Medicine

## 2020-06-10 ENCOUNTER — Other Ambulatory Visit (HOSPITAL_COMMUNITY)
Admission: RE | Admit: 2020-06-10 | Discharge: 2020-06-10 | Disposition: A | Discharge status: Home / Self Care | Payer: Medicare Other | Source: Ambulatory Visit | Attending: Family Medicine | Admitting: Family Medicine

## 2020-06-10 VITALS — BP 129/85 | HR 85 | Ht 65.0 in | Wt 214.0 lb

## 2020-06-10 DIAGNOSIS — Z124 Encounter for screening for malignant neoplasm of cervix: Secondary | ICD-10-CM | POA: Diagnosis not present

## 2020-06-10 DIAGNOSIS — Z1151 Encounter for screening for human papillomavirus (HPV): Secondary | ICD-10-CM | POA: Diagnosis not present

## 2020-06-10 DIAGNOSIS — N921 Excessive and frequent menstruation with irregular cycle: Secondary | ICD-10-CM

## 2020-06-10 DIAGNOSIS — N920 Excessive and frequent menstruation with regular cycle: Secondary | ICD-10-CM | POA: Insufficient documentation

## 2020-06-10 MED ORDER — MEDROXYPROGESTERONE ACETATE 10 MG PO TABS: 10.0000 mg | ORAL_TABLET | Freq: Every day | ORAL | 0 refills | Status: DC

## 2020-06-10 NOTE — Progress Notes (Signed)
Acute Office Visit  Subjective:    Patient ID: Desiree Wilson, female    DOB: 21-Jan-1966, 54 y.o.   MRN: [id number]  Chief Complaint  Patient presents with  . Metrorrhagia    HPI Patient is in today for vaginal bleeding x1 month daily.  She has been on continuous birth control for quite a long time up until about 6 months ago.  We did discuss coming off of it because she was now 80.  She said she did come off of it and for 5 months did not have the.  Then starting a month ago she started bleeding again and has bled every day since then.  We actually did some hormone testing in January and she still has slightly elevated estrogen levels.  She just cannot remember it for sure if she is on the pill at that time or not she had been off of it for about a month.  It is difficult to assess whether or not she is truly postmenopausal.  Past Medical History:  Diagnosis Date  . Allergy    rhinitis  . Anemia   . Blood transfusion    x2 1983 surgery blood loss  . Blood transfusion without reported diagnosis   . Depression   . Facial paralysis    right face/ chronic pain syndrome (pain management Dr Sydell Axon)  . Headache(784.0)   . Hyperlipidemia   . Hypotension   . Iron deficiency anemia 11/15/2014  . Iron malabsorption 11/15/2014  . Osteoarthritis   . Trigeminal neuralgia    right  . Trigeminal neuralgia 2002    Past Surgical History:  Procedure Laterality Date  . gamma knife Right 01/2018  . MANDIBLE SURGERY    . OSTEOTOMY    . 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 Cancer Mother   . Stroke Father   . Diverticulitis Father   . Colon cancer Neg Hx     Social History   Socioeconomic History  . Marital status: Single    Spouse name: [name]  . Number of children: 0  . Years of education: Masters Degree  . Highest education level: Master's degree (e.g., MA, MS,  MEng, MEd, MSW, MBA)  Occupational History  . Occupation: disabled  Tobacco Use  . Smoking status: Current Some Day Smoker    Years: 6.00    Types: Cigarettes    Last attempt to quit: 12/31/2000    Years since quitting: 19.4  . Smokeless tobacco: Never Used  . Tobacco comment: tobacco infor given  Vaping Use  . Vaping Use: Some days  . Start date: 06/30/2018  . Substances: Nicotine  Substance and Sexual Activity  . Alcohol use: No    Alcohol/week: 0.0 standard drinks  . Drug use: No  . Sexual activity: Yes    Partners: Male  Other Topics Concern  . Not on file  Social History Narrative   Wants to start back exercising. Goal is to loose 20 to 40 lbs in the next year. Coffee in the morning and tea during the day   Social Determinants of Health   Financial Resource Strain:   . Difficulty of Paying Living Expenses:   Food Insecurity:   . Worried About Charity fundraiser in the Last Year:   . Arboriculturist in the Last Year:   Transportation Needs:   .  Lack of Transportation (Medical):   Marland Kitchen Lack of Transportation (Non-Medical):   Physical Activity:   . Days of Exercise per Week:   . Minutes of Exercise per Session:   Stress:   . Feeling of Stress :   Social Connections:   . Frequency of Communication with Friends and Family:   . Frequency of Social Gatherings with Friends and Family:   . Attends Religious Services:   . Active Member of Clubs or Organizations:   . Attends Archivist Meetings:   Marland Kitchen Marital Status:   Intimate Partner Violence:   . Fear of Current or Ex-Partner:   . Emotionally Abused:   Marland Kitchen Physically Abused:   . Sexually Abused:     Outpatient Medications Prior to Visit  Medication Sig Dispense Refill  . ARIPiprazole (ABILIFY) 2 MG tablet TAKE 1 TABLET BY MOUTH DAILY 90 tablet 0  . aspirin-acetaminophen-caffeine (EXCEDRIN MIGRAINE) 762-831-51 MG tablet Take by mouth.    Marland Kitchen buPROPion (WELLBUTRIN SR) 150 MG 12 hr tablet TAKE 1 TABLET BY MOUTH ONCE  A DAY FOR THE FIRST 4 DAYS, THEN INCREASE TO 1 TABLET TWICE DAILY 60 tablet 1  . clonazePAM (KLONOPIN) 1 MG tablet Take 1 tablet (1 mg total) by mouth 2 (two) times daily. 30 tablet 2  . diphenoxylate-atropine (LOMOTIL) 2.5-0.025 MG tablet TAKE 1 TABLET BY MOUTH FOUR TIMES DAILY AS NEEDED FOR DIARRHEA OR LOOSE STOOL 30 tablet 4  . frovatriptan (FROVA) 2.5 MG tablet Take 2.5 mg by mouth daily as needed.     . gabapentin (NEURONTIN) 600 MG tablet Take 600 mg by mouth 3 (three) times daily.    . hyoscyamine (LEVSIN SL) 0.125 MG SL tablet Take one every 8 hours as needed 30 tablet 2  . ketamine (KETALAR) 10 MG/ML injection Inject into the vein every 3 (three) months.     . Multiple Vitamin (MULTIVITAMIN) capsule Take 1 capsule by mouth daily.    . NUCYNTA 50 MG TABS tablet Take 50 mg by mouth every 4 (four) hours as needed for severe pain. Once daily   0  . omeprazole (PRILOSEC) 40 MG capsule TAKE ONE CAPSULE BY MOUTH DAILY 90 capsule 0  . Oxcarbazepine (TRILEPTAL) 300 MG tablet Take 300 mg by mouth 2 (two) times daily.    . promethazine (PHENERGAN) 25 MG tablet Take 1 tablet (25 mg total) by mouth every 8 (eight) hours as needed for nausea or vomiting. 20 tablet 0  . rosuvastatin (CRESTOR) 20 MG tablet TAKE 1 TABLET BY MOUTH DAILY 90 tablet 3  . sertraline (ZOLOFT) 100 MG tablet TAKE 1.5 TABLETS BY MOUTH DAILY. 145 tablet 1  . terbinafine (LAMISIL) 250 MG tablet Take 1 tablet (250 mg total) by mouth daily. 90 tablet 1  . tiZANidine (ZANAFLEX) 4 MG tablet Take 4 mg by mouth every 6 (six) hours as needed.    . traZODone (DESYREL) 100 MG tablet Take 2 tablets (200 mg total) by mouth at bedtime as needed for sleep. 90 tablet 1   No facility-administered medications prior to visit.    Allergies  Allergen Reactions  . Azithromycin Rash    REACTION: C Diff Other reaction(s): Other (See Comments), Other (See Comments) REACTION: C Diff REACTION: C Diff REACTION: C Diff   . Methadone     Other  reaction(s): Hallucinations, Other (See Comments), Other (See Comments) Hallucinations Hallucinations Hallucinations   . Tapentadol Anxiety    Other reaction(s): Dizziness  . Phenobarbital     REACTION: Excited  Review of Systems     Objective:    Physical Exam Vitals reviewed. Exam conducted with a chaperone present.  Constitutional:      Appearance: She is well-developed.  HENT:     Head: Normocephalic and atraumatic.  Eyes:     Conjunctiva/sclera: Conjunctivae normal.  Cardiovascular:     Rate and Rhythm: Normal rate.  Pulmonary:     Effort: Pulmonary effort is normal.  Genitourinary:    Labia:        Right: No tenderness or lesion.        Left: No tenderness or lesion.      Vagina: Normal.     Cervix: Normal.     Uterus: Normal.      Adnexa: Right adnexa normal and left adnexa normal.     Rectum: Normal.  Skin:    General: Skin is dry.     Coloration: Skin is not pale.  Neurological:     Mental Status: She is alert and oriented to person, place, and time.  Psychiatric:        Behavior: Behavior normal.     BP 129/85   Pulse 85   Ht 5\' 5"  (1.651 m)   Wt 214 lb (97.1 kg)   LMP 05/04/2020 (Exact Date)   SpO2 98%   BMI 35.61 kg/m  Wt Readings from Last 3 Encounters:  06/10/20 214 lb (97.1 kg)  04/27/20 215 lb (97.5 kg)  04/07/20 217 lb (98.4 kg)    Health Maintenance Due  Topic Date Due  . Hepatitis C Screening  Never done    There are no preventive care reminders to display for this patient.   Lab Results  Component Value Date   TSH 1.21 01/19/2020   Lab Results  Component Value Date   WBC 10.3 05/14/2016   HGB 13.8 05/14/2016   HCT 42.0 05/14/2016   MCV 84.0 05/14/2016   PLT 176 05/14/2016   Lab Results  Component Value Date   NA 133 (L) 01/19/2020   K 4.7 01/19/2020   CO2 26 01/19/2020   GLUCOSE 101 (H) 01/19/2020   BUN 17 01/19/2020   CREATININE 1.00 01/19/2020   BILITOT 0.2 03/09/2019   ALKPHOS 94 03/05/2017   AST 13  03/09/2019   ALT 9 03/09/2019   PROT 6.5 03/09/2019   ALBUMIN 4.1 03/05/2017   CALCIUM 9.2 01/19/2020   Lab Results  Component Value Date   CHOL 215 (H) 03/09/2019   Lab Results  Component Value Date   HDL 76 03/09/2019   Lab Results  Component Value Date   LDLCALC 111 (H) 03/09/2019   Lab Results  Component Value Date   TRIG 163 (H) 03/09/2019   Lab Results  Component Value Date   CHOLHDL 2.8 03/09/2019   Lab Results  Component Value Date   HGBA1C 5.6 07/15/2017       Assessment & Plan:   Problem List Items Addressed This Visit      Other   Menorrhagia   Relevant Orders   US Pelvic Complete With Transvaginal    Other Visit Diagnoses    Screening for cervical cancer    -  Primary   Relevant Orders   Cytology - PAP     Menorrhagia-did go ahead and do pelvic exam no abnormal findings except for a little bleeding at the os.  Pap smear performed we will also do STD testing for gonorrhea and chlamydia.  Will call with results once available also get her scheduled  for pelvic ultrasound to look for any signs of endometrial hyperplasia or abnormality of the uterus.  Did go ahead and put her on high-dose progesterone to stop the bleeding but explained that it can restart after the progesterone.  She can start her birth control pill pill back afterwards.  She is taking drospirenone/ethinyl estradiol 3 mg.  Consider GYN referral if needed for endometrial biopsy.  Again difficult to determine whether or not she is truly postmenopausal  Meds ordered this encounter  Medications  . medroxyPROGESTERone (PROVERA) 10 MG tablet    Sig: Take 1-2 tablets (10-20 mg total) by mouth daily.    Dispense:  20 tablet    Refill:  0     [name], MD

## 2020-06-12 ENCOUNTER — Other Ambulatory Visit: Payer: Self-pay | Admitting: Family Medicine

## 2020-06-14 LAB — CYTOLOGY - PAP
Chlamydia: NEGATIVE
Comment: NEGATIVE
Comment: NEGATIVE
Comment: NORMAL
Diagnosis: NEGATIVE
High risk HPV: NEGATIVE
Neisseria Gonorrhea: NEGATIVE

## 2020-06-14 NOTE — Progress Notes (Signed)
Call patient: Your Pap smear is normal. Repeat in 5 years.

## 2020-06-15 DIAGNOSIS — R6884 Jaw pain: Secondary | ICD-10-CM | POA: Diagnosis not present

## 2020-06-23 ENCOUNTER — Other Ambulatory Visit: Payer: Self-pay

## 2020-06-23 ENCOUNTER — Ambulatory Visit (INDEPENDENT_AMBULATORY_CARE_PROVIDER_SITE_OTHER): Payer: Medicare Other

## 2020-06-23 DIAGNOSIS — N921 Excessive and frequent menstruation with irregular cycle: Secondary | ICD-10-CM | POA: Diagnosis not present

## 2020-06-23 DIAGNOSIS — N83202 Unspecified ovarian cyst, left side: Secondary | ICD-10-CM | POA: Diagnosis not present

## 2020-07-09 ENCOUNTER — Other Ambulatory Visit: Payer: Self-pay | Admitting: Family Medicine

## 2020-07-12 DIAGNOSIS — G501 Atypical facial pain: Secondary | ICD-10-CM | POA: Diagnosis not present

## 2020-07-12 DIAGNOSIS — G510 Bell's palsy: Secondary | ICD-10-CM | POA: Diagnosis not present

## 2020-07-12 DIAGNOSIS — K117 Disturbances of salivary secretion: Secondary | ICD-10-CM | POA: Diagnosis not present

## 2020-07-12 DIAGNOSIS — Z87891 Personal history of nicotine dependence: Secondary | ICD-10-CM | POA: Diagnosis not present

## 2020-07-12 DIAGNOSIS — G500 Trigeminal neuralgia: Secondary | ICD-10-CM | POA: Diagnosis not present

## 2020-07-12 DIAGNOSIS — G44209 Tension-type headache, unspecified, not intractable: Secondary | ICD-10-CM | POA: Diagnosis not present

## 2020-07-12 DIAGNOSIS — S0451XS Injury of facial nerve, right side, sequela: Secondary | ICD-10-CM | POA: Diagnosis not present

## 2020-07-12 DIAGNOSIS — M7918 Myalgia, other site: Secondary | ICD-10-CM | POA: Diagnosis not present

## 2020-07-18 ENCOUNTER — Other Ambulatory Visit: Payer: Self-pay | Admitting: Family Medicine

## 2020-07-18 DIAGNOSIS — F5101 Primary insomnia: Secondary | ICD-10-CM

## 2020-08-01 DIAGNOSIS — D485 Neoplasm of uncertain behavior of skin: Secondary | ICD-10-CM | POA: Diagnosis not present

## 2020-08-01 DIAGNOSIS — H10531 Contact blepharoconjunctivitis, right eye: Secondary | ICD-10-CM | POA: Diagnosis not present

## 2020-08-01 DIAGNOSIS — L570 Actinic keratosis: Secondary | ICD-10-CM | POA: Diagnosis not present

## 2020-08-08 ENCOUNTER — Other Ambulatory Visit: Payer: Self-pay | Admitting: Nurse Practitioner

## 2020-08-08 DIAGNOSIS — Z716 Tobacco abuse counseling: Secondary | ICD-10-CM

## 2020-09-20 ENCOUNTER — Telehealth (INDEPENDENT_AMBULATORY_CARE_PROVIDER_SITE_OTHER): Payer: Medicare Other | Admitting: Family Medicine

## 2020-09-20 ENCOUNTER — Encounter: Payer: Self-pay | Admitting: Family Medicine

## 2020-09-20 ENCOUNTER — Telehealth: Payer: Medicare Other | Admitting: Family Medicine

## 2020-09-20 DIAGNOSIS — J0100 Acute maxillary sinusitis, unspecified: Secondary | ICD-10-CM | POA: Diagnosis not present

## 2020-09-20 MED ORDER — AMOXICILLIN 875 MG PO TABS: 875.0000 mg | ORAL_TABLET | Freq: Two times a day (BID) | ORAL | 0 refills | Status: DC

## 2020-09-20 MED ORDER — FLUTICASONE PROPIONATE 50 MCG/ACT NA SUSP: 2.0000 | Freq: Every day | NASAL | 6 refills | Status: DC

## 2020-09-20 NOTE — Progress Notes (Signed)
Virtual Visit via Video Note  I connected with Desiree Wilson on 09/20/20 at  1:00 PM EDT by a video enabled telemedicine application and verified that I am speaking with the correct person using two identifiers.   I discussed the limitations of evaluation and management by telemedicine and the availability of in person appointments. The patient expressed understanding and agreed to proceed.  Patient location: at home Provider location: in office  Subjective:    CC: Sinus sxs  HPI: Pt reports that her sxs started 1 week ago. She said that her sinuses are sore and swollen,watery eyes. Has bilateral pressure over her facial cheeks. Right seems worse.  HA frontal as well. Lots of posta nasal drip and triggers cough when she lays down. She has been sneezing a lot it the mornings.  Flying to [place] in about a week.  No sick contacts. No COVID exposure.  Ears feel stuffy.    Taken mucinex sinus,psuedaphed and some cough medication.   Denies f/s/c/n/v/d.  + smoker   Past medical history, Surgical history, Family history not pertinant except as noted below, Social history, Allergies, and medications have been entered into the medical record, reviewed, and corrections made.   Review of Systems: No fevers, chills, night sweats, weight loss, chest pain, or shortness of breath.   Objective:    General: Speaking clearly in complete sentences without any shortness of breath.  Alert and oriented x3.  Normal judgment. No apparent acute distress.    Impression and Recommendations:    No problem-specific Assessment & Plan notes found for this encounter.  Acute sinusitis-we will treat with amoxicillin and fluticasone.  Call if not better in 1 week.  She feels very strongly that she has not been around anybody with Covid.  But if symptoms persist consider testing.   Time spent in encounter 15 minutes  I discussed the assessment and treatment plan with the patient. The patient was provided an  opportunity to ask questions and all were answered. The patient agreed with the plan and demonstrated an understanding of the instructions.   The patient was advised to call back or seek an in-person evaluation if the symptoms worsen or if the condition fails to improve as anticipated.   [name], MD

## 2020-09-20 NOTE — Progress Notes (Signed)
Pt reports that her sxs started 1 week ago. She said that her sinuses are sore and swollen,watery eyes. She reports that the pressure is pressing down on the Trigeminal nerve. She is flying out next wk to [facility] next week and wanted to get something for this before leaving.   Taken mucinex sinus,psuedphed   Denies f/s/c/n/v/d

## 2020-10-11 DIAGNOSIS — G894 Chronic pain syndrome: Secondary | ICD-10-CM | POA: Diagnosis not present

## 2020-10-26 ENCOUNTER — Ambulatory Visit (INDEPENDENT_AMBULATORY_CARE_PROVIDER_SITE_OTHER): Payer: Medicare Other | Admitting: Family Medicine

## 2020-10-26 ENCOUNTER — Other Ambulatory Visit: Payer: Self-pay

## 2020-10-26 ENCOUNTER — Encounter: Payer: Self-pay | Admitting: Family Medicine

## 2020-10-26 VITALS — BP 133/80 | HR 82 | Ht 65.0 in | Wt 213.0 lb

## 2020-10-26 DIAGNOSIS — R0683 Snoring: Secondary | ICD-10-CM | POA: Diagnosis not present

## 2020-10-26 DIAGNOSIS — Z6835 Body mass index (BMI) 35.0-35.9, adult: Secondary | ICD-10-CM | POA: Diagnosis not present

## 2020-10-26 DIAGNOSIS — Z23 Encounter for immunization: Secondary | ICD-10-CM

## 2020-10-26 DIAGNOSIS — K529 Noninfective gastroenteritis and colitis, unspecified: Secondary | ICD-10-CM

## 2020-10-26 DIAGNOSIS — Z6838 Body mass index (BMI) 38.0-38.9, adult: Secondary | ICD-10-CM | POA: Insufficient documentation

## 2020-10-26 DIAGNOSIS — L989 Disorder of the skin and subcutaneous tissue, unspecified: Secondary | ICD-10-CM | POA: Diagnosis not present

## 2020-10-26 MED ORDER — DIPHENOXYLATE-ATROPINE 2.5-0.025 MG PO TABS: ORAL_TABLET | ORAL | 4 refills | Status: DC

## 2020-10-26 NOTE — Progress Notes (Signed)
Established Patient Office Visit  Subjective:  Patient ID: Desiree Wilson, female    DOB: Jan 28, 1966  Age: 54 y.o. MRN: [id number]  CC:  Chief Complaint  Patient presents with  . Insomnia    HPI Desiree Wilson presents for snoring.  She says it is gotten worse to the point that people can hear her through to close doors.  She has had sleep studies done years ago.  Unsure of those results.  She is also gained a lot of weight over the last couple of years and feels like that is probably contributing as well. Her husband complains about her snoring and husband reports she stops breathing sometimes.  Does have a deviated septum. Hx of TMJ.  Just to the point where she feels exhausted during the day.  She wakes up with morning headaches sometimes.  The snoring often wakes her up and really impacts her quality of sleep.  Also has a couple skin lesions that she would like me to look at today.  She said she had a precancerous lesion removed on the back of the left hand.  She has another crusty little lesion on the outer lower arm just below the elbow and one on the inner arm below the elbow and a lesion on her left knee that she would like me to look at today.  Past Medical History:  Diagnosis Date  . Allergy    rhinitis  . Anemia   . Blood transfusion    x2 1983 surgery blood loss  . Blood transfusion without reported diagnosis   . Depression   . Facial paralysis    right face/ chronic pain syndrome (pain management Dr Sydell Axon)  . Headache(784.0)   . Hyperlipidemia   . Hypotension   . Iron deficiency anemia 11/15/2014  . Iron malabsorption 11/15/2014  . Osteoarthritis   . Trigeminal neuralgia    right  . Trigeminal neuralgia 2002    Past Surgical History:  Procedure Laterality Date  . gamma knife Right 01/2018  . MANDIBLE SURGERY    . OSTEOTOMY    . 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 Cancer Mother   . Stroke Father   . Diverticulitis Father   . Colon cancer Neg Hx     Social History   Socioeconomic History  . Marital status: Single    Spouse name: [name]  . Number of children: 0  . Years of education: Masters Degree  . Highest education level: Master's degree (e.g., MA, MS, MEng, MEd, MSW, MBA)  Occupational History  . Occupation: disabled  Tobacco Use  . Smoking status: Current Some Day Smoker    Years: 6.00    Types: Cigarettes    Last attempt to quit: 12/31/2000    Years since quitting: 19.8  . Smokeless tobacco: Never Used  . Tobacco comment: tobacco infor given  Vaping Use  . Vaping Use: Some days  . Start date: 06/30/2018  . Substances: Nicotine  Substance and Sexual Activity  . Alcohol use: No    Alcohol/week: 0.0 standard drinks  . Drug use: No  . Sexual activity: Yes    Partners: Male  Other Topics Concern  . Not on file  Social History Narrative   Wants to start back exercising. Goal is to loose 20 to 40 lbs in the next year. Coffee in the morning  and tea during the day   Social Determinants of Health   Financial Resource Strain:   . Difficulty of Paying Living Expenses: Not on file  Food Insecurity:   . Worried About Charity fundraiser in the Last Year: Not on file  . Ran Out of Food in the Last Year: Not on file  Transportation Needs:   . Lack of Transportation (Medical): Not on file  . Lack of Transportation (Non-Medical): Not on file  Physical Activity:   . Days of Exercise per Week: Not on file  . Minutes of Exercise per Session: Not on file  Stress:   . Feeling of Stress : Not on file  Social Connections:   . Frequency of Communication with Friends and Family: Not on file  . Frequency of Social Gatherings with Friends and Family: Not on file  . Attends Religious Services: Not on file  . Active Member of Clubs or Organizations: Not on file  . Attends Archivist Meetings: Not on file   . Marital Status: Not on file  Intimate Partner Violence:   . Fear of Current or Ex-Partner: Not on file  . Emotionally Abused: Not on file  . Physically Abused: Not on file  . Sexually Abused: Not on file    Outpatient Medications Prior to Visit  Medication Sig Dispense Refill  . ARIPiprazole (ABILIFY) 2 MG tablet TAKE 1 TABLET BY MOUTH DAILY 90 tablet 1  . aspirin-acetaminophen-caffeine (EXCEDRIN MIGRAINE) [phone] MG tablet Take by mouth.    . clonazePAM (KLONOPIN) 1 MG tablet Take 1 tablet (1 mg total) by mouth 2 (two) times daily. 30 tablet 2  . drospirenone-ethinyl estradiol (YAZ) 3-0.02 MG tablet Take 1 tablet by mouth daily. 84 tablet 1  . fluticasone (FLONASE) 50 MCG/ACT nasal spray Place 2 sprays into both nostrils daily. 16 g 6  . frovatriptan (FROVA) 2.5 MG tablet Take 2.5 mg by mouth daily as needed.     . gabapentin (NEURONTIN) 600 MG tablet Take 600 mg by mouth 3 (three) times daily.    . hydrocortisone 2.5 % cream Apply topically.    . hyoscyamine (LEVSIN SL) 0.125 MG SL tablet Take one every 8 hours as needed 30 tablet 2  . ketamine (KETALAR) 10 MG/ML injection Inject into the vein every 3 (three) months.     . medroxyPROGESTERone (PROVERA) 10 MG tablet Take 1-2 tablets (10-20 mg total) by mouth daily. 20 tablet 0  . Multiple Vitamin (MULTIVITAMIN) capsule Take 1 capsule by mouth daily.    Marland Kitchen omeprazole (PRILOSEC) 40 MG capsule TAKE ONE CAPSULE BY MOUTH DAILY 90 capsule 3  . Oxcarbazepine (TRILEPTAL) 300 MG tablet Take 300 mg by mouth 2 (two) times daily.    . promethazine (PHENERGAN) 25 MG tablet Take 1 tablet (25 mg total) by mouth every 8 (eight) hours as needed for nausea or vomiting. 20 tablet 0  . rosuvastatin (CRESTOR) 20 MG tablet TAKE 1 TABLET BY MOUTH DAILY 90 tablet 3  . sertraline (ZOLOFT) 100 MG tablet TAKE 1.5 TABLETS BY MOUTH DAILY. 145 tablet 1  . terbinafine (LAMISIL) 250 MG tablet Take 1 tablet (250 mg total) by mouth daily. 90 tablet 1  . tiZANidine  (ZANAFLEX) 4 MG tablet Take 4 mg by mouth every 6 (six) hours as needed.    . traZODone (DESYREL) 100 MG tablet TAKE 2 TABLETS(200 MG) BY MOUTH AT BEDTIME AS NEEDED FOR SLEEP 90 tablet 1  . amoxicillin (AMOXIL) 875 MG tablet Take 1 tablet (  875 mg total) by mouth 2 (two) times daily. 14 tablet 0  . diphenoxylate-atropine (LOMOTIL) 2.5-0.025 MG tablet TAKE 1 TABLET BY MOUTH FOUR TIMES DAILY AS NEEDED FOR DIARRHEA OR LOOSE STOOL 30 tablet 4  . NUCYNTA 50 MG TABS tablet Take 50 mg by mouth every 4 (four) hours as needed for severe pain. Once daily   0   No facility-administered medications prior to visit.    Allergies  Allergen Reactions  . Azithromycin Rash    REACTION: C Diff Other reaction(s): Other (See Comments), Other (See Comments) REACTION: C Diff REACTION: C Diff REACTION: C Diff   . Methadone     Other reaction(s): Hallucinations, Other (See Comments), Other (See Comments) Hallucinations Hallucinations Hallucinations   . Tapentadol Anxiety    Other reaction(s): Dizziness  . Phenobarbital     REACTION: Excited    ROS Review of Systems    Objective:    Physical Exam Constitutional:      Appearance: She is well-developed.  HENT:     Head: Normocephalic and atraumatic.  Cardiovascular:     Rate and Rhythm: Normal rate and regular rhythm.     Heart sounds: Normal heart sounds.  Pulmonary:     Effort: Pulmonary effort is normal.     Breath sounds: Normal breath sounds.  Skin:    General: Skin is warm and dry.     Comments: The dorsum of her left hand she has a shiny round shaped scar from where she had previous lesion removed.  On her lower arm just below the elbow on the outer area she has a hyperkeratotic light brown lesion.  More medially she has a papular pink rough textured lesion.  And on her left knee she has a light brown hyperkeratotic lesion  Neurological:     Mental Status: She is alert and oriented to person, place, and time.  Psychiatric:         Behavior: Behavior normal.     BP 133/80   Pulse 82   Ht 5\' 5"  (1.651 m)   Wt 213 lb (96.6 kg)   SpO2 99%   BMI 35.45 kg/m  Wt Readings from Last 3 Encounters:  10/26/20 213 lb (96.6 kg)  06/10/20 214 lb (97.1 kg)  04/27/20 215 lb (97.5 kg)     Health Maintenance Due  Topic Date Due  . Hepatitis C Screening  Never done    There are no preventive care reminders to display for this patient.  Lab Results  Component Value Date   TSH 1.21 01/19/2020   Lab Results  Component Value Date   WBC 10.3 05/14/2016   HGB 13.8 05/14/2016   HCT 42.0 05/14/2016   MCV 84.0 05/14/2016   PLT 176 05/14/2016   Lab Results  Component Value Date   NA 133 (L) 01/19/2020   K 4.7 01/19/2020   CO2 26 01/19/2020   GLUCOSE 101 (H) 01/19/2020   BUN 17 01/19/2020   CREATININE 1.00 01/19/2020   BILITOT 0.2 03/09/2019   ALKPHOS 94 03/05/2017   AST 13 03/09/2019   ALT 9 03/09/2019   PROT 6.5 03/09/2019   ALBUMIN 4.1 03/05/2017   CALCIUM 9.2 01/19/2020   Lab Results  Component Value Date   CHOL 215 (H) 03/09/2019   Lab Results  Component Value Date   HDL 76 03/09/2019   Lab Results  Component Value Date   LDLCALC 111 (H) 03/09/2019   Lab Results  Component Value Date   TRIG 163 (H)  03/09/2019   Lab Results  Component Value Date   CHOLHDL 2.8 03/09/2019   Lab Results  Component Value Date   HGBA1C 5.6 07/15/2017      Assessment & Plan:   Problem List Items Addressed This Visit      Digestive   Chronic diarrhea    Needs refill on her lomotil        Relevant Medications   diphenoxylate-atropine (LOMOTIL) 2.5-0.025 MG tablet     Other   Snoring - Primary    Discussed getting further evaluated for sleep apnea with a sleep study.  She would prefer something closer to the [facility] location.      Relevant Orders   Split night study   BMI 35.0-35.9,adult    She is concerned about her weight gain.  I do suspect that it is probably causing some increase in her  snoring symptoms and possible sleep apnea.  We will evaluate further.  Work on weight loss.       Relevant Orders   Split night study    Other Visit Diagnoses    Need for immunization against influenza       Relevant Orders   Flu Vaccine QUAD 36+ mos IM (Completed)   Skin lesion       Need for tetanus, diphtheria, and acellular pertussis (Tdap) vaccine in patient of adolescent age or older       Relevant Orders   Tdap vaccine greater than or equal to 7yo IM (Completed)     Skin  lesions are most consistent with seborrheic keratoses.  We discussed possible removal with either cryotherapy or shave biopsy if she would prefer.  She does not have a upcoming appointment with her dermatologist soon.  But says she may call them to try to get in.  Meds ordered this encounter  Medications  . diphenoxylate-atropine (LOMOTIL) 2.5-0.025 MG tablet    Sig: TAKE 1 TABLET BY MOUTH FOUR TIMES DAILY AS NEEDED FOR DIARRHEA OR LOOSE STOOL    Dispense:  30 tablet    Refill:  4    Follow-up: No follow-ups on file.    [name], MD

## 2020-10-26 NOTE — Assessment & Plan Note (Signed)
She is concerned about her weight gain.  I do suspect that it is probably causing some increase in her snoring symptoms and possible sleep apnea.  We will evaluate further.  Work on weight loss.

## 2020-10-26 NOTE — Assessment & Plan Note (Signed)
Needs refill on her lomotil

## 2020-10-26 NOTE — Patient Instructions (Signed)
Once I get your sleep study report back we will give you a call and get you scheduled to go over the results and discuss treatment options.

## 2020-10-26 NOTE — Assessment & Plan Note (Signed)
Discussed getting further evaluated for sleep apnea with a sleep study.  She would prefer something closer to the [facility] location.

## 2020-10-27 ENCOUNTER — Ambulatory Visit: Payer: Medicare Other | Admitting: Family Medicine

## 2020-10-31 ENCOUNTER — Telehealth: Payer: Self-pay | Admitting: Family Medicine

## 2020-10-31 NOTE — Telephone Encounter (Signed)
Call pt: the lomotil for the diarrhea is not covered by the insurance. I can't remember if she has tried Immodium OTC yet.

## 2020-11-02 NOTE — Telephone Encounter (Signed)
Called patient, she has tried Immodium and was on fairly high doses without any success.   Requesting we get this authorized with insurance

## 2020-11-12 ENCOUNTER — Other Ambulatory Visit: Payer: Self-pay | Admitting: Family Medicine

## 2020-11-23 ENCOUNTER — Other Ambulatory Visit: Payer: Self-pay | Admitting: Family Medicine

## 2020-11-23 DIAGNOSIS — F5101 Primary insomnia: Secondary | ICD-10-CM

## 2020-11-28 DIAGNOSIS — Z0120 Encounter for dental examination and cleaning without abnormal findings: Secondary | ICD-10-CM | POA: Diagnosis not present

## 2020-12-02 ENCOUNTER — Other Ambulatory Visit: Payer: Self-pay | Admitting: Family Medicine

## 2021-01-05 DIAGNOSIS — Z20822 Contact with and (suspected) exposure to covid-19: Secondary | ICD-10-CM | POA: Diagnosis not present

## 2021-01-05 DIAGNOSIS — U071 COVID-19: Secondary | ICD-10-CM | POA: Diagnosis not present

## 2021-01-06 ENCOUNTER — Telehealth: Payer: Medicare Other | Admitting: Family Medicine

## 2021-01-25 ENCOUNTER — Other Ambulatory Visit: Payer: Self-pay | Admitting: Family Medicine

## 2021-01-25 DIAGNOSIS — F5101 Primary insomnia: Secondary | ICD-10-CM

## 2021-02-02 ENCOUNTER — Encounter: Payer: Self-pay | Admitting: Family Medicine

## 2021-02-02 ENCOUNTER — Telehealth (INDEPENDENT_AMBULATORY_CARE_PROVIDER_SITE_OTHER): Payer: Medicare Other | Admitting: Family Medicine

## 2021-02-02 VITALS — Ht 65.0 in | Wt 215.0 lb

## 2021-02-02 DIAGNOSIS — F3341 Major depressive disorder, recurrent, in partial remission: Secondary | ICD-10-CM | POA: Diagnosis not present

## 2021-02-02 DIAGNOSIS — R0981 Nasal congestion: Secondary | ICD-10-CM | POA: Diagnosis not present

## 2021-02-02 DIAGNOSIS — F5101 Primary insomnia: Secondary | ICD-10-CM | POA: Diagnosis not present

## 2021-02-02 DIAGNOSIS — N1831 Chronic kidney disease, stage 3a: Secondary | ICD-10-CM

## 2021-02-02 DIAGNOSIS — E785 Hyperlipidemia, unspecified: Secondary | ICD-10-CM

## 2021-02-02 DIAGNOSIS — G500 Trigeminal neuralgia: Secondary | ICD-10-CM

## 2021-02-02 MED ORDER — TRAZODONE HCL 100 MG PO TABS: ORAL_TABLET | ORAL | 1 refills | Status: DC

## 2021-02-02 MED ORDER — SERTRALINE HCL 100 MG PO TABS: ORAL_TABLET | ORAL | 1 refills | Status: DC

## 2021-02-02 NOTE — Assessment & Plan Note (Signed)
Following renal function every 6 months. 

## 2021-02-02 NOTE — Assessment & Plan Note (Addendum)
Recommend adding nasal saline before using her flonase.  She does have deviated septum.  Also recommend running a humidifier especially during the winter months when the heat is running and it dries out the air.  Will place new ENT referral per patient preference.

## 2021-02-02 NOTE — Assessment & Plan Note (Signed)
Not sleeping well bc ever since she had to put her dog to sleep.  She is waking up at night and then can't go back to sleep. She is out trazodone.

## 2021-02-02 NOTE — Assessment & Plan Note (Signed)
Due to recheck lipids. 

## 2021-02-02 NOTE — Assessment & Plan Note (Signed)
Receiving ketamine infusions which tend to work well to help control her pain.

## 2021-02-02 NOTE — Progress Notes (Signed)
Pt also asked if there was a stronger antihistamine or decongestant  that she can get due to the trigeminal neuralgia. She states that the sinus pressure gets really bad and aggravates that nerve.    Pt would like a referral to Dr. Wilford Corner. [address] [phone]   She reports that she was seeing someone here in [place] that wanted to do some extensive surgery to break her nose and she felt that because of her previous facial surgeries that was aggressive.

## 2021-02-02 NOTE — Assessment & Plan Note (Signed)
Will refill the zoloft as well.

## 2021-02-02 NOTE — Progress Notes (Signed)
Virtual Visit via Video Note  I connected with Desiree Wilson on 02/02/21 at  1:40 PM EST by a video enabled telemedicine application and verified that I am speaking with the correct person using two identifiers.   I discussed the limitations of evaluation and management by telemedicine and the availability of in person appointments. The patient expressed understanding and agreed to proceed.  Patient location: at home Provider location: in office  Subjective:    CC: chronic nasal congestion.    HPI:  Pt also asked if there was a stronger antihistamine or decongestant  that she can get due to the trigeminal neuralgia. She states that the sinus pressure gets really bad and aggravates that nerve.     Follow-up trigeminal neuralgia-she would like to get an 2nd opinion from ENT about her sinus pressure and congestion.  Chronically feels congested in her right face where she has surgery.  Feels like it is pain on top of her trigeminal  Neuralgia.  Is even tried some nasal ketamine spray but has not really helped.  She already uses some Flonase.  Pt would like a referral to Dr. Wilford Corner. [place], [place].  [phone]  She reports that she was seeing someone here in [place] that wanted to do some extensive surgery to break her nose and she felt that because of her previous facial surgeries that was aggressive.   Feels like she is doing okay on her Zoloft.  She does need refills today.  Past medical history, Surgical history, Family history not pertinant except as noted below, Social history, Allergies, and medications have been entered into the medical record, reviewed, and corrections made.   Review of Systems: No fevers, chills, night sweats, weight loss, chest pain, or shortness of breath.   Objective:    General: Speaking clearly in complete sentences without any shortness of breath.  Alert and oriented x3.  Normal judgment. No apparent  acute distress.    Impression and Recommendations:    Sinus congestion Recommend adding nasal saline before using her flonase.  She does have deviated septum.  Also recommend running a humidifier especially during the winter months when the heat is running and it dries out the air.  Will place new ENT referral per patient preference.    Primary insomnia Not sleeping well bc ever since she had to put her dog to sleep.  She is waking up at night and then can't go back to sleep. She is out trazodone.    Depression Will refill the zoloft as well.   Trigeminal neuralgia Receiving ketamine infusions which tend to work well to help control her pain.  CKD (chronic kidney disease) stage 3, GFR 30-59 ml/min Following renal function every 6 months.  Hyperlipidemia Due to recheck lipids.  Orders Placed This Encounter  Procedures  . CBC  . COMPLETE METABOLIC PANEL WITH GFR  . Lipid panel    Order Specific Question:   Has the patient fasted?    Answer:   Yes  . Ambulatory referral to ENT    Referral Priority:   Routine    Referral Type:   Consultation    Referral Reason:   Specialty Services Required    Referred to Provider:   Maggie Schwalbe., MD    Number of Visits Requested:   1      Time spent in encounter 21 minutes  I discussed the assessment and treatment plan with the patient. The  patient was provided an opportunity to ask questions and all were answered. The patient agreed with the plan and demonstrated an understanding of the instructions.   The patient was advised to call back or seek an in-person evaluation if the symptoms worsen or if the condition fails to improve as anticipated.   [name], MD

## 2021-02-10 ENCOUNTER — Other Ambulatory Visit: Payer: Self-pay | Admitting: Family Medicine

## 2021-02-15 DIAGNOSIS — G501 Atypical facial pain: Secondary | ICD-10-CM | POA: Diagnosis not present

## 2021-02-15 DIAGNOSIS — G894 Chronic pain syndrome: Secondary | ICD-10-CM | POA: Diagnosis not present

## 2021-03-02 DIAGNOSIS — E785 Hyperlipidemia, unspecified: Secondary | ICD-10-CM | POA: Diagnosis not present

## 2021-03-02 DIAGNOSIS — N1831 Chronic kidney disease, stage 3a: Secondary | ICD-10-CM | POA: Diagnosis not present

## 2021-03-02 LAB — COMPLETE METABOLIC PANEL WITH GFR
AG Ratio: 1.5 (calc) (ref 1.0–2.5)
ALT: 13 U/L (ref 6–29)
AST: 18 U/L (ref 10–35)
Albumin: 4 g/dL (ref 3.6–5.1)
Alkaline phosphatase (APISO): 68 U/L (ref 37–153)
BUN/Creatinine Ratio: 19 (calc) (ref 6–22)
BUN: 20 mg/dL (ref 7–25)
CO2: 23 mmol/L (ref 20–32)
Calcium: 9.2 mg/dL (ref 8.6–10.4)
Chloride: 105 mmol/L (ref 98–110)
Creat: 1.06 mg/dL — ABNORMAL HIGH (ref 0.50–1.05)
GFR, Est African American: 69 mL/min/{1.73_m2} (ref >=60)
GFR, Est Non African American: 59 mL/min/{1.73_m2} — ABNORMAL LOW (ref >=60)
Globulin: 2.6 g/dL (calc) (ref 1.9–3.7)
Glucose, Bld: 134 mg/dL (ref 65–139)
Potassium: 4.1 mmol/L (ref 3.5–5.3)
Sodium: 137 mmol/L (ref 135–146)
Total Bilirubin: 0.2 mg/dL (ref 0.2–1.2)
Total Protein: 6.6 g/dL (ref 6.1–8.1)

## 2021-03-02 LAB — CBC
HCT: 37.7 % (ref 35.0–45.0)
Hemoglobin: 12.6 g/dL (ref 11.7–15.5)
MCH: 26.9 pg — ABNORMAL LOW (ref 27.0–33.0)
MCHC: 33.4 g/dL (ref 32.0–36.0)
MCV: 80.4 fL (ref 80.0–100.0)
MPV: 12 fL (ref 7.5–12.5)
Platelets: 185 10*3/uL (ref 140–400)
RBC: 4.69 10*6/uL (ref 3.80–5.10)
RDW: 13.7 % (ref 11.0–15.0)
WBC: 11.4 10*3/uL — ABNORMAL HIGH (ref 3.8–10.8)

## 2021-03-02 LAB — LIPID PANEL
Cholesterol: 203 mg/dL — ABNORMAL HIGH (ref <200)
HDL: 76 mg/dL (ref >=50)
LDL Cholesterol (Calc): 93 mg/dL (calc)
Non-HDL Cholesterol (Calc): 127 mg/dL (calc) (ref <130)
Total CHOL/HDL Ratio: 2.7 (calc) (ref <5.0)
Triglycerides: 245 mg/dL — ABNORMAL HIGH (ref <150)

## 2021-03-06 ENCOUNTER — Telehealth: Payer: Self-pay | Admitting: Family Medicine

## 2021-03-06 MED ORDER — ARIPIPRAZOLE 5 MG PO TABS
5.0000 mg | ORAL_TABLET | Freq: Every day | ORAL | 0 refills | Status: DC
Start: 2021-03-06 — End: 2021-03-22

## 2021-03-06 NOTE — Telephone Encounter (Signed)
Pt advised.

## 2021-03-06 NOTE — Telephone Encounter (Signed)
Bumped up abilify to 5mg . New rx sent to local pharm

## 2021-03-06 NOTE — Telephone Encounter (Signed)
Patient called and scheduled a office visit for next Tuesday March 15th due to her depression and her feeling more tearful lately. Patient is wondering if Dr. Madilyn Fireman could go ahead and up her dose on her medication and if that would help her. Please call patient and advise [phone]

## 2021-03-08 DIAGNOSIS — J343 Hypertrophy of nasal turbinates: Secondary | ICD-10-CM | POA: Diagnosis not present

## 2021-03-08 DIAGNOSIS — J342 Deviated nasal septum: Secondary | ICD-10-CM | POA: Diagnosis not present

## 2021-03-14 ENCOUNTER — Ambulatory Visit: Payer: Medicare Other | Admitting: Family Medicine

## 2021-03-17 DIAGNOSIS — J342 Deviated nasal septum: Secondary | ICD-10-CM | POA: Diagnosis not present

## 2021-03-17 DIAGNOSIS — J343 Hypertrophy of nasal turbinates: Secondary | ICD-10-CM | POA: Diagnosis not present

## 2021-03-22 ENCOUNTER — Encounter: Payer: Self-pay | Admitting: Family Medicine

## 2021-03-22 ENCOUNTER — Telehealth (INDEPENDENT_AMBULATORY_CARE_PROVIDER_SITE_OTHER): Payer: Medicare Other | Admitting: Family Medicine

## 2021-03-22 VITALS — Ht 65.0 in | Wt 229.0 lb

## 2021-03-22 DIAGNOSIS — Z6838 Body mass index (BMI) 38.0-38.9, adult: Secondary | ICD-10-CM | POA: Diagnosis not present

## 2021-03-22 DIAGNOSIS — F3341 Major depressive disorder, recurrent, in partial remission: Secondary | ICD-10-CM | POA: Diagnosis not present

## 2021-03-22 MED ORDER — ARIPIPRAZOLE 5 MG PO TABS: 5.0000 mg | ORAL_TABLET | Freq: Every day | ORAL | 2 refills | Status: DC

## 2021-03-22 NOTE — Assessment & Plan Note (Signed)
She is really frustrated with her weight she has been working with a Physiological scientist she has been trying to eat more healthy drink more water and just cannot seem to lose weight in fact she is actually gained weight and is really quite frustrated.  We discussed possible referral to a bariatric clinic where she can get more specialty care and have access to a nutritionist and therapist who can help her with food choices etc.  Since she is putting a lot of changes in effort and really not seeing a result I think they could really help [place] figure out why she is not losing weight.  We did discuss again that the Abilify could make it more difficult for her to lose weight so that may be something that we will have to address but currently it is really helping with her depression and so I want to keep it for at least the next month if at all possible and then we can always taper back if she is doing well.

## 2021-03-22 NOTE — Assessment & Plan Note (Signed)
Is doing much better overall since the bump in the Abilify she is really felt like it made a noticeable difference in her mood.  I think just since having surgery she is actually just had a lot of relief she was really worried about the outcome.  She still recovering in a fair amount of pain and discomfort but does feel like she is progressing and has a follow-up in a couple weeks for wound check.  We discussed continue with the Abilify 5 mg for now may be another month if she is feeling better we can always try to scale it back we did discuss that it may make it a little bit more difficult for her to lose weight which she is struggling with right now.

## 2021-03-22 NOTE — Progress Notes (Signed)
Virtual Visit via Video Note  I connected with Desiree Wilson on 03/22/21 at  8:10 AM EDT by a video enabled telemedicine application and verified that I am speaking with the correct person using two identifiers.   I discussed the limitations of evaluation and management by telemedicine and the availability of in person appointments. The patient expressed understanding and agreed to proceed.  Patient location: Provider location: in office  Subjective:    CC: Follow-up depression  HPI: She actually just had sinus surgery on March 18 she had a septoplasty with turbinate reduction. She is still feeling sore.    She is feeling better overall. She felt really bad 3 weeks ago and had reached out via [place]. She was stressed about her surgery and the war in [place]. She feels the increase ont Abilify to 5mg  has been helpful.   Upset about her weight. Has been working with her Physiological scientist and has been eating healthy and has gained about 10 lbs. Started a regimen in January.    Past medical history, Surgical history, Family history not pertinant except as noted below, Social history, Allergies, and medications have been entered into the medical record, reviewed, and corrections made.   Review of Systems: No fevers, chills, night sweats, weight loss, chest pain, or shortness of breath.   Objective:    General: Speaking clearly in complete sentences without any shortness of breath.  Alert and oriented x3.  Normal judgment. No apparent acute distress.    Impression and Recommendations:    Depression Is doing much better overall since the bump in the Abilify she is really felt like it made a noticeable difference in her mood.  I think just since having surgery she is actually just had a lot of relief she was really worried about the outcome.  She still recovering in a fair amount of pain and discomfort but does feel like she is progressing and has a follow-up in a couple weeks for wound  check.  We discussed continue with the Abilify 5 mg for now may be another month if she is feeling better we can always try to scale it back we did discuss that it may make it a little bit more difficult for her to lose weight which she is struggling with right now.  BMI 38.0-38.9,adult She is really frustrated with her weight she has been working with a Physiological scientist she has been trying to eat more healthy drink more water and just cannot seem to lose weight in fact she is actually gained weight and is really quite frustrated.  We discussed possible referral to a bariatric clinic where she can get more specialty care and have access to a nutritionist and therapist who can help her with food choices etc.  Since she is putting a lot of changes in effort and really not seeing a result I think they could really help [place] figure out why she is not losing weight.  We did discuss again that the Abilify could make it more difficult for her to lose weight so that may be something that we will have to address but currently it is really helping with her depression and so I want to keep it for at least the next month if at all possible and then we can always taper back if she is doing well.      Time spent in encounter 21 minutes  I discussed the assessment and treatment plan with the patient. The patient was provided  an opportunity to ask questions and all were answered. The patient agreed with the plan and demonstrated an understanding of the instructions.   The patient was advised to call back or seek an in-person evaluation if the symptoms worsen or if the condition fails to improve as anticipated.   [name], MD

## 2021-03-22 NOTE — Progress Notes (Signed)
Pt reports that she had surgery on Friday for her sinuses.  Pt would like to discuss her weight she is currently working w/personal trainer, modifying her diet and has gained 10 lbs in the past 2 weeks

## 2021-03-23 ENCOUNTER — Other Ambulatory Visit: Payer: Self-pay | Admitting: Family Medicine

## 2021-04-05 ENCOUNTER — Other Ambulatory Visit: Payer: Self-pay | Admitting: Family Medicine

## 2021-04-11 ENCOUNTER — Telehealth: Payer: Self-pay

## 2021-04-11 MED ORDER — ARIPIPRAZOLE 5 MG PO TABS: 5.0000 mg | ORAL_TABLET | Freq: Every day | ORAL | 2 refills | Status: DC

## 2021-04-11 NOTE — Telephone Encounter (Signed)
Pt called requesting refill on her Abilify. Not on current med list though pt has a history of being on this med. Looks like it was discontinued last month 03/23/21 however Dr. Madilyn Fireman made a note on 03/22/21 stating she would like her to take it for another month. Please advise, thanks.

## 2021-04-18 DIAGNOSIS — Z20822 Contact with and (suspected) exposure to covid-19: Secondary | ICD-10-CM | POA: Diagnosis not present

## 2021-04-18 DIAGNOSIS — J069 Acute upper respiratory infection, unspecified: Secondary | ICD-10-CM | POA: Diagnosis not present

## 2021-04-20 ENCOUNTER — Ambulatory Visit (INDEPENDENT_AMBULATORY_CARE_PROVIDER_SITE_OTHER): Payer: Medicare Other | Admitting: Family Medicine

## 2021-05-02 ENCOUNTER — Telehealth: Payer: Self-pay | Admitting: *Deleted

## 2021-05-04 ENCOUNTER — Ambulatory Visit (INDEPENDENT_AMBULATORY_CARE_PROVIDER_SITE_OTHER): Payer: Medicare Other | Admitting: Family Medicine

## 2021-05-08 ENCOUNTER — Other Ambulatory Visit: Payer: Self-pay | Admitting: Family Medicine

## 2021-05-08 DIAGNOSIS — F5101 Primary insomnia: Secondary | ICD-10-CM

## 2021-05-11 ENCOUNTER — Other Ambulatory Visit: Payer: Self-pay

## 2021-05-11 ENCOUNTER — Encounter (INDEPENDENT_AMBULATORY_CARE_PROVIDER_SITE_OTHER): Payer: Self-pay | Admitting: Family Medicine

## 2021-05-11 ENCOUNTER — Ambulatory Visit (INDEPENDENT_AMBULATORY_CARE_PROVIDER_SITE_OTHER): Payer: Medicare Other | Admitting: Family Medicine

## 2021-05-11 VITALS — BP 126/84 | HR 79 | Temp 98.1°F | Ht 65.0 in | Wt 216.0 lb

## 2021-05-11 DIAGNOSIS — G500 Trigeminal neuralgia: Secondary | ICD-10-CM

## 2021-05-11 DIAGNOSIS — R5383 Other fatigue: Secondary | ICD-10-CM

## 2021-05-11 DIAGNOSIS — Z6836 Body mass index (BMI) 36.0-36.9, adult: Secondary | ICD-10-CM | POA: Diagnosis not present

## 2021-05-11 DIAGNOSIS — R0683 Snoring: Secondary | ICD-10-CM

## 2021-05-11 DIAGNOSIS — F3289 Other specified depressive episodes: Secondary | ICD-10-CM | POA: Diagnosis not present

## 2021-05-11 DIAGNOSIS — R7301 Impaired fasting glucose: Secondary | ICD-10-CM

## 2021-05-11 DIAGNOSIS — E7849 Other hyperlipidemia: Secondary | ICD-10-CM | POA: Diagnosis not present

## 2021-05-11 DIAGNOSIS — R0602 Shortness of breath: Secondary | ICD-10-CM

## 2021-05-11 DIAGNOSIS — Z0289 Encounter for other administrative examinations: Secondary | ICD-10-CM

## 2021-05-11 DIAGNOSIS — Z79899 Other long term (current) drug therapy: Secondary | ICD-10-CM

## 2021-05-11 DIAGNOSIS — G894 Chronic pain syndrome: Secondary | ICD-10-CM

## 2021-05-12 LAB — COMPREHENSIVE METABOLIC PANEL
ALT: 17 IU/L (ref 0–32)
AST: 20 IU/L (ref 0–40)
Albumin/Globulin Ratio: 1.8 (ref 1.2–2.2)
Albumin: 4.4 g/dL (ref 3.8–4.9)
Alkaline Phosphatase: 86 IU/L (ref 44–121)
BUN/Creatinine Ratio: 23 (ref 9–23)
BUN: 22 mg/dL (ref 6–24)
Bilirubin Total: 0.2 mg/dL (ref 0.0–1.2)
CO2: 16 mmol/L — ABNORMAL LOW (ref 20–29)
Calcium: 9.3 mg/dL (ref 8.7–10.2)
Chloride: 100 mmol/L (ref 96–106)
Creatinine, Ser: 0.94 mg/dL (ref 0.57–1.00)
Globulin, Total: 2.4 g/dL (ref 1.5–4.5)
Glucose: 100 mg/dL — ABNORMAL HIGH (ref 65–99)
Potassium: 4.4 mmol/L (ref 3.5–5.2)
Sodium: 136 mmol/L (ref 134–144)
Total Protein: 6.8 g/dL (ref 6.0–8.5)
eGFR: 72 mL/min/{1.73_m2} (ref >59)

## 2021-05-12 LAB — CBC WITH DIFFERENTIAL/PLATELET
Basophils Absolute: 0 10*3/uL (ref 0.0–0.2)
Basos: 0 %
EOS (ABSOLUTE): 0 10*3/uL (ref 0.0–0.4)
Eos: 0 %
Hematocrit: 42 % (ref 34.0–46.6)
Hemoglobin: 13.5 g/dL (ref 11.1–15.9)
Immature Grans (Abs): 0 10*3/uL (ref 0.0–0.1)
Immature Granulocytes: 0 %
Lymphocytes Absolute: 2.1 10*3/uL (ref 0.7–3.1)
Lymphs: 18 %
MCH: 26 pg — ABNORMAL LOW (ref 26.6–33.0)
MCHC: 32.1 g/dL (ref 31.5–35.7)
MCV: 81 fL (ref 79–97)
Monocytes Absolute: 0.7 10*3/uL (ref 0.1–0.9)
Monocytes: 6 %
Neutrophils Absolute: 8.5 10*3/uL — ABNORMAL HIGH (ref 1.4–7.0)
Neutrophils: 76 %
Platelets: 216 10*3/uL (ref 150–450)
RBC: 5.19 x10E6/uL (ref 3.77–5.28)
RDW: 13.8 % (ref 11.7–15.4)
WBC: 11.4 10*3/uL — ABNORMAL HIGH (ref 3.4–10.8)

## 2021-05-12 LAB — VITAMIN D 25 HYDROXY (VIT D DEFICIENCY, FRACTURES): Vit D, 25-Hydroxy: 18.9 ng/mL — ABNORMAL LOW (ref 30.0–100.0)

## 2021-05-12 LAB — IRON,TIBC AND FERRITIN PANEL
Ferritin: 24 ng/mL (ref 15–150)
Iron Saturation: 28 % (ref 15–55)
Iron: 113 ug/dL (ref 27–159)
Total Iron Binding Capacity: 398 ug/dL (ref 250–450)
UIBC: 285 ug/dL (ref 131–425)

## 2021-05-12 LAB — TSH: TSH: 0.933 u[IU]/mL (ref 0.450–4.500)

## 2021-05-12 LAB — HEMOGLOBIN A1C
Est. average glucose Bld gHb Est-mCnc: 120 mg/dL
Hgb A1c MFr Bld: 5.8 % — ABNORMAL HIGH (ref 4.8–5.6)

## 2021-05-12 LAB — VITAMIN B12: Vitamin B-12: 275 pg/mL (ref 232–1245)

## 2021-05-12 LAB — T4, FREE: Free T4: 0.92 ng/dL (ref 0.82–1.77)

## 2021-05-12 LAB — INSULIN, RANDOM: INSULIN: 17.6 u[IU]/mL (ref 2.6–24.9)

## 2021-05-17 NOTE — Progress Notes (Signed)
Dear Dr. Madilyn Wilson,   Thank you for referring Desiree Wilson to our clinic. The following note includes my evaluation and treatment recommendations.  Chief Complaint:   OBESITY Desiree Wilson (MR# [id number]) is a 55 y.o. female who presents for evaluation and treatment of obesity and related comorbidities. Current BMI is Body mass index is 35.94 kg/m. Desiree Wilson has been struggling with her weight for many years and has been unsuccessful in either losing weight, maintaining weight loss, or reaching her healthy weight goal.  Desiree Wilson is currently in the action stage of change and ready to dedicate time achieving and maintaining a healthier weight. Desiree Wilson is interested in becoming our patient and working on intensive lifestyle modifications including (but not limited to) diet and exercise for weight loss.  Desiree Wilson is disabled.  She lives with her partner, Desiree Wilson.  Normally for breakfast, she has leftovers from dinner.  She does not eat fried foods and has been eating more green vegetables.  She has been restricting her carbs.  She says, "I don't feel full".  Desiree Wilson's habits were reviewed today and are as follows: Her family eats meals together, she thinks her family will eat healthier with her, her desired weight loss is 69 pounds, she started gaining weight 5 years ago, her heaviest weight ever was 229 pounds, she craves protein and fruit, she is frequently drinking liquids with calories, she frequently makes poor food choices, she has problems with excessive hunger, she frequently eats larger portions than normal and she struggles with emotional eating.  Depression Screen Desiree Wilson's Food and Mood (modified PHQ-9) score was 14.  Depression screen PHQ 2/9 05/11/2021  Decreased Interest 2  Down, Depressed, Hopeless 2  PHQ - 2 Score 4  Altered sleeping 3  Tired, decreased energy 3  Change in appetite 2  Feeling bad or failure about yourself  0  Trouble concentrating 2  Moving slowly or  fidgety/restless 0  Suicidal thoughts 0  PHQ-9 Score 14  Difficult doing work/chores Not difficult at all  Some recent data might be hidden   Assessment/Plan:   1. Other fatigue Desiree Wilson admits to daytime somnolence and reports waking up still tired. Patent has a history of symptoms of daytime fatigue, morning fatigue, morning headache and snoring. Desiree Wilson generally gets 6 hours of sleep per night, and states that she has poor quality sleep. Snoring is present. Apneic episodes are present. Epworth Sleepiness Score is 10.  Desiree Wilson does feel that her weight is causing her energy to be lower than it should be. Fatigue may be related to obesity, depression or many other causes. Labs will be ordered, and in the meanwhile, Desiree Wilson will focus on self care including making healthy food choices, increasing physical activity and focusing on stress reduction.  - EKG 12-Lead - CBC with Differential/Platelet - Comprehensive metabolic panel - Hemoglobin A1c - Insulin, random - VITAMIN D 25 Hydroxy (Vit-D Deficiency, Fractures) - TSH - T4, free - Vitamin B12 - Iron, TIBC and Ferritin Panel  2. SOB (shortness of breath) on exertion Desiree Wilson notes increasing shortness of breath with exercising and seems to be worsening over time with weight gain. She notes getting out of breath sooner with activity than she used to. This has gotten worse recently. Desiree Wilson denies shortness of breath at rest or orthopnea.  Desiree Wilson does feel that she gets out of breath more easily that she used to when she exercises. Desiree Wilson's shortness of breath appears to be obesity related and exercise induced. She has  agreed to work on weight loss and gradually increase exercise to treat her exercise induced shortness of breath. Will continue to monitor closely.  - CBC with Differential/Platelet - Comprehensive metabolic panel - Hemoglobin A1c - Insulin, random - VITAMIN D 25 Hydroxy (Vit-D Deficiency, Fractures) - TSH - T4, free - Vitamin B12 -  Iron, TIBC and Ferritin Panel  3. Other hyperlipidemia Course: Uncontrolled.  Lipid-lowering medications: Crestor 20 mg daily. She had an FLP on 03/02/2021.  Plan: Dietary changes: Increase soluble fiber, decrease simple carbohydrates, decrease saturated fat. Exercise changes: Moderate to vigorous-intensity aerobic activity 150 minutes per week or as tolerated. We will continue to monitor along with PCP/specialists as it pertains to her weight loss journey.  Lab Results  Component Value Date   CHOL 203 (H) 03/02/2021   HDL 76 03/02/2021   LDLCALC 93 03/02/2021   TRIG 245 (H) 03/02/2021   CHOLHDL 2.7 03/02/2021   Lab Results  Component Value Date   ALT 17 05/11/2021   AST 20 05/11/2021   ALKPHOS 86 05/11/2021   BILITOT 0.2 05/11/2021   The 10-year ASCVD risk score Desiree Wilson DC Jr., et al., 2013) is: 3.2%   Values used to calculate the score:     Age: 51 years     Sex: Female     Is Non-Hispanic African American: No     Diabetic: No     Tobacco smoker: Yes     Systolic Blood Pressure: 563 mmHg     Is BP treated: No     HDL Cholesterol: 76 mg/dL     Total Cholesterol: 203 mg/dL  4. Trigeminal neuralgia Desiree Wilson is taking Trileptal 300 mg for trigeminal neuralgia.  5. Chronic pain syndrome Desiree Wilson is followed by Pain Management for chronic pain syndrome.  Plan:  We will follow along closely as it relates to her weight loss journey.  6. Snores Desiree Wilson endorses snoring, morning headaches, and apneic episodes.  Epworth score is 10.  7. Medication management Desiree Wilson is on several obesogenic medications.  Plan:  Will check labs today, as per below.  - VITAMIN D 25 Hydroxy (Vit-D Deficiency, Fractures) - Vitamin B12 - Iron, TIBC and Ferritin Panel  8. Fasting hyperglycemia Will check CMP, A1c, and insulin level today, as per below.  - Comprehensive metabolic panel - Hemoglobin A1c - Insulin, random  9. Other depression, with emotional eating Not at goal. Medication: Zoloft 150 mg  daily.  Desiree Wilson eats when stressed, when sad, to stay awake, as a reward, when bored, and for comfort.  Plan:  Discussed cues and consequences, how thoughts affect eating, model of thoughts, feelings, and behaviors, and strategies for change by focusing on the cue. Discussed cognitive distortions, coping thoughts, and how to change your thoughts. Behavior modification techniques were discussed today to help deal with emotional/non-hunger eating behaviors.  10. Class 2 severe obesity with serious comorbidity and body mass index (BMI) of 36.0 to 36.9 in adult, unspecified obesity type Desiree Wilson) Lenoria is currently in the action stage of change and her goal is to continue with weight loss efforts. I recommend Arlis begin the structured treatment plan as follows:  She has agreed to the Category 2 Plan.  Exercise goals: No exercise has been prescribed at this time.   Behavioral modification strategies: increasing lean protein intake, decreasing simple carbohydrates, increasing vegetables, increasing water intake, decreasing liquid calories, decreasing sodium intake and increasing high fiber foods.  She was informed of the importance of frequent follow-up visits to maximize her success  with intensive lifestyle modifications for her multiple health conditions. She was informed we would discuss her lab results at her next visit unless there is a critical issue that needs to be addressed sooner. Shantaria agreed to keep her next visit at the agreed upon time to discuss these results.  Objective:   Blood pressure 126/84, pulse 79, temperature 98.1 F (36.7 C), temperature source Oral, height 5\' 5"  (1.651 m), weight 216 lb (98 kg), SpO2 94 %. Body mass index is 35.94 kg/m.  EKG: Normal sinus rhythm, rate 86 bpm.  Indirect Calorimeter completed today shows a VO2 of 244 and a REE of 1698.  Her calculated basal metabolic rate is [id number] thus her basal metabolic rate is better than expected.  General: Cooperative,  alert, well developed, in no acute distress. HEENT: Conjunctivae and lids unremarkable. Cardiovascular: Regular rhythm.  Lungs: Normal work of breathing. Neurologic: No focal deficits.   Lab Results  Component Value Date   CREATININE 0.94 05/11/2021   BUN 22 05/11/2021   NA 136 05/11/2021   K 4.4 05/11/2021   CL 100 05/11/2021   CO2 16 (L) 05/11/2021   Lab Results  Component Value Date   ALT 17 05/11/2021   AST 20 05/11/2021   ALKPHOS 86 05/11/2021   BILITOT 0.2 05/11/2021   Lab Results  Component Value Date   HGBA1C 5.8 (H) 05/11/2021   HGBA1C 5.6 07/15/2017   HGBA1C 5.5 03/27/2013   Lab Results  Component Value Date   INSULIN 17.6 05/11/2021   Lab Results  Component Value Date   TSH 0.933 05/11/2021   Lab Results  Component Value Date   CHOL 203 (H) 03/02/2021   HDL 76 03/02/2021   LDLCALC 93 03/02/2021   TRIG 245 (H) 03/02/2021   CHOLHDL 2.7 03/02/2021   Lab Results  Component Value Date   WBC 11.4 (H) 05/11/2021   HGB 13.5 05/11/2021   HCT 42.0 05/11/2021   MCV 81 05/11/2021   PLT 216 05/11/2021   Lab Results  Component Value Date   IRON 113 05/11/2021   TIBC 398 05/11/2021   FERRITIN 24 05/11/2021   Obesity Behavioral Intervention:   Approximately 15 minutes were spent on the discussion below.  ASK: We discussed the diagnosis of obesity with [name] Signs today and Jimeka agreed to give [place] permission to discuss obesity behavioral modification therapy today.  ASSESS: Uriana has the diagnosis of obesity and her BMI today is 36.0. Tahreem is in the action stage of change.   ADVISE: Elorah was educated on the multiple health risks of obesity as well as the benefit of weight loss to improve her health. She was advised of the need for long term treatment and the importance of lifestyle modifications to improve her current health and to decrease her risk of future health problems.  AGREE: Multiple dietary modification options and treatment options were  discussed and Zaviah agreed to follow the recommendations documented in the above note.  ARRANGE: Sharnelle was educated on the importance of frequent visits to treat obesity as outlined per CMS and USPSTF guidelines and agreed to schedule her next follow up appointment today.  Attestation Statements:   This is the patient's first visit at [facility]. The patient's NEW PATIENT PACKET was reviewed at length. Included in the packet: current and past health history, medications, allergies, ROS, gynecologic history (women only), surgical history, family history, social history, weight history, weight loss surgery history (for those that have had weight loss surgery), nutritional evaluation,  mood and food questionnaire, PHQ9, Epworth questionnaire, sleep habits questionnaire, patient life and health improvement goals questionnaire. These will all be scanned into the patient's chart under media.   During the visit, I independently reviewed the patient's EKG, bioimpedance scale results, and indirect calorimeter results. I used this information to tailor a meal plan for the patient that will help her to lose weight and will improve her obesity-related conditions going forward. I performed a medically necessary appropriate examination and/or evaluation. I discussed the assessment and treatment plan with the patient. The patient was provided an opportunity to ask questions and all were answered. The patient agreed with the plan and demonstrated an understanding of the instructions. Labs were ordered at this visit and will be reviewed at the next visit unless more critical results need to be addressed immediately. Clinical information was updated and documented in the EMR.   I, Water quality scientist, CMA, am acting as transcriptionist for Briscoe Deutscher, DO  I have reviewed the above documentation for accuracy and completeness, and I agree with the above. Briscoe Deutscher, DO

## 2021-05-25 ENCOUNTER — Encounter (INDEPENDENT_AMBULATORY_CARE_PROVIDER_SITE_OTHER): Payer: Self-pay | Admitting: Family Medicine

## 2021-05-25 ENCOUNTER — Other Ambulatory Visit: Payer: Self-pay

## 2021-05-25 ENCOUNTER — Ambulatory Visit (INDEPENDENT_AMBULATORY_CARE_PROVIDER_SITE_OTHER): Payer: Medicare Other | Admitting: Family Medicine

## 2021-05-25 VITALS — BP 126/84 | HR 82 | Temp 98.0°F | Ht 65.0 in | Wt 214.0 lb

## 2021-05-25 DIAGNOSIS — G894 Chronic pain syndrome: Secondary | ICD-10-CM

## 2021-05-25 DIAGNOSIS — Z6836 Body mass index (BMI) 36.0-36.9, adult: Secondary | ICD-10-CM

## 2021-05-25 DIAGNOSIS — E559 Vitamin D deficiency, unspecified: Secondary | ICD-10-CM | POA: Diagnosis not present

## 2021-05-25 DIAGNOSIS — E538 Deficiency of other specified B group vitamins: Secondary | ICD-10-CM

## 2021-05-25 DIAGNOSIS — F3341 Major depressive disorder, recurrent, in partial remission: Secondary | ICD-10-CM

## 2021-05-25 DIAGNOSIS — R7303 Prediabetes: Secondary | ICD-10-CM | POA: Diagnosis not present

## 2021-05-25 DIAGNOSIS — R790 Abnormal level of blood mineral: Secondary | ICD-10-CM

## 2021-05-25 MED ORDER — VITAMIN D (ERGOCALCIFEROL) 1.25 MG (50000 UNIT) PO CAPS: 50000.0000 [IU] | ORAL_CAPSULE | ORAL | 0 refills | Status: DC

## 2021-05-30 ENCOUNTER — Telehealth (INDEPENDENT_AMBULATORY_CARE_PROVIDER_SITE_OTHER): Payer: Medicare Other | Admitting: Family Medicine

## 2021-05-30 ENCOUNTER — Encounter: Payer: Self-pay | Admitting: Family Medicine

## 2021-05-30 ENCOUNTER — Encounter (INDEPENDENT_AMBULATORY_CARE_PROVIDER_SITE_OTHER): Payer: Self-pay | Admitting: Family Medicine

## 2021-05-30 DIAGNOSIS — Z6832 Body mass index (BMI) 32.0-32.9, adult: Secondary | ICD-10-CM | POA: Insufficient documentation

## 2021-05-30 DIAGNOSIS — Z6836 Body mass index (BMI) 36.0-36.9, adult: Secondary | ICD-10-CM | POA: Insufficient documentation

## 2021-05-30 DIAGNOSIS — F3341 Major depressive disorder, recurrent, in partial remission: Secondary | ICD-10-CM | POA: Insufficient documentation

## 2021-05-30 DIAGNOSIS — R7303 Prediabetes: Secondary | ICD-10-CM | POA: Insufficient documentation

## 2021-05-30 DIAGNOSIS — J019 Acute sinusitis, unspecified: Secondary | ICD-10-CM | POA: Diagnosis not present

## 2021-05-30 DIAGNOSIS — R790 Abnormal level of blood mineral: Secondary | ICD-10-CM | POA: Insufficient documentation

## 2021-05-30 DIAGNOSIS — G894 Chronic pain syndrome: Secondary | ICD-10-CM | POA: Insufficient documentation

## 2021-05-30 DIAGNOSIS — E538 Deficiency of other specified B group vitamins: Secondary | ICD-10-CM | POA: Insufficient documentation

## 2021-05-30 MED ORDER — AMOXICILLIN-POT CLAVULANATE 875-125 MG PO TABS: 1.0000 | ORAL_TABLET | Freq: Two times a day (BID) | ORAL | 0 refills | Status: DC

## 2021-05-30 NOTE — Progress Notes (Signed)
Virtual Visit via Video Note  I connected with Desiree Wilson on 05/30/21 at 10:10 AM EDT by a video enabled telemedicine application and verified that I am speaking with the correct person using two identifiers.   I discussed the limitations of evaluation and management by telemedicine and the availability of in person appointments. The patient expressed understanding and agreed to proceed.  Patient location: at home Provider location: in office  Subjective:    CC: Sick   HPI: Pt reports that she has felt worse the last 2 day  2 days. Denies any f/s/c/n /v/d. She has been taking dayquil and tylenol. She tested 1 month ago this was negative and feels like this is just her sinuses. She has somewhat of a productive cough. Not much nasal drainage. She does have some post nasal drainage. Pressure around her eye.  + sinus HA.  No HA.   + cough from drainage.    She was dx with a URI about a month ago and tested neg for COVID about 3 wks ago. She said she will start to feel better and then feel worse and feels it is fluctuating.   He husband has been sick x 2 wks he has been tested. Negative for covid.     Past medical history, Surgical history, Family history not pertinant except as noted below, Social history, Allergies, and medications have been entered into the medical record, reviewed, and corrections made.    Objective:    General: Speaking clearly in complete sentences without any shortness of breath.  Alert and oriented x3.  Normal judgment. No apparent acute distress.    Impression and Recommendations:    No problem-specific Assessment & Plan notes found for this encounter.  Acute sinusitis - sxs x 3 weeks, worse in the last 2 days.  Still a little unclear if this may be a separate infection or if just worsening of the initial.  So I am going to go ahead and treat her for sinusitis though I did encourage her to swab again for COVID especially if she is felt worse over the  last couple of days.  Call if any problems or concerns or if just not getting better by the end of the week.  Continue symptomatic care.  She also wanted to discuss her Abilify.  Dr. Juleen China who has been working with her for weight loss has recommended switching her to [facility], which is a little bit more weight neutral.  I am certainly open to the change but would need to do some additional research as I am not familiar with this particular medication.  She is can go ahead and schedule follow-up next week so that we can address her shingles vaccine, a recent mole and then we can discuss the lybalvi bit further.   No orders of the defined types were placed in this encounter.   Meds ordered this encounter  Medications  . amoxicillin-clavulanate (AUGMENTIN) 875-125 MG tablet    Sig: Take 1 tablet by mouth 2 (two) times daily.    Dispense:  14 tablet    Refill:  0     I discussed the assessment and treatment plan with the patient. The patient was provided an opportunity to ask questions and all were answered. The patient agreed with the plan and demonstrated an understanding of the instructions.   The patient was advised to call back or seek an in-person evaluation if the symptoms worsen or if the condition fails to improve as anticipated.  Desiree Lecher, MD

## 2021-05-30 NOTE — Progress Notes (Signed)
Chief Complaint:   OBESITY Desiree Wilson is here to discuss her progress with her obesity treatment plan along with follow-up of her obesity related diagnoses. See Medical Weight Management Flowsheet for bioelectrical impedance results.  Interim History: Recent pain flare. Usual Ketamine infusion delayed due to insurance issues. She has been relying on her husband for meals.   Nutrition Plan: the Category 2 Plan 0% of the time. Activity: None.  Assessment/Plan:   1. Vitamin D deficiency Not at goal. Optimal goal > 50 ng/dL.   Plan:  - Vitamin D, Ergocalciferol, (DRISDOL) 1.25 MG (50000 UNIT) CAPS capsule; Take 1 capsule (50,000 Units total) by mouth every 7 (seven) days.  Dispense: 12 capsule; Refill: 0  2. Prediabetes New. Goal is HgbA1c < 5.7.  Medication: None.  She will continue to focus on protein-rich, low simple carbohydrate foods. We reviewed the importance of hydration, regular exercise for stress reduction, and restorative sleep.   Lab Results  Component Value Date   HGBA1C 5.8 (H) 05/11/2021   Lab Results  Component Value Date   INSULIN 17.6 05/11/2021   3. Low serum ferritin level New. We discussed the importance of ferritin > 50. Recommend OTC supplementation qod.   4. B12 deficiency  Lab Results  Component Value Date   VITAMINB12 275 05/11/2021   Start supplementation: OTC B12 1000 mcg daily. Recheck her B12 level with the next set of labs.   5. Chronic pain syndrome Worsening. She will call [facility] again today.  6. Recurrent major depressive disorder, in partial remission (Desiree Wilson) Current Medication: Abilify. She will meet with her PCP next week and expects the dose to be lowered. Consider change to Lybalvi.  7. Obesity, current BMI 35.7  Course: Desiree Wilson is currently in the action stage of change. As such, her goal is to continue with weight loss efforts.   Nutrition goals: She has agreed to the Category 2 Plan.   Exercise goals: No  exercise has been prescribed at this time.  Behavioral modification strategies: increasing lean protein intake, decreasing simple carbohydrates, increasing vegetables and increasing water intake.  Desiree Wilson has agreed to follow-up with our clinic in 2 weeks. She was informed of the importance of frequent follow-up visits to maximize her success with intensive lifestyle modifications for her multiple health conditions.   Objective:   Blood pressure 126/84, pulse 82, temperature 98 F (36.7 C), temperature source Oral, height 5\' 5"  (1.651 m), weight 214 lb (97.1 kg), SpO2 95 %. Body mass index is 35.61 kg/m.  General: Cooperative, alert, well developed, in no acute distress. HEENT: Conjunctivae and lids unremarkable. Cardiovascular: Regular rhythm.  Lungs: Normal work of breathing. Neurologic: No focal deficits.   Lab Results  Component Value Date   CREATININE 0.94 05/11/2021   BUN 22 05/11/2021   NA 136 05/11/2021   K 4.4 05/11/2021   CL 100 05/11/2021   CO2 16 (L) 05/11/2021   Lab Results  Component Value Date   ALT 17 05/11/2021   AST 20 05/11/2021   ALKPHOS 86 05/11/2021   BILITOT 0.2 05/11/2021   Lab Results  Component Value Date   HGBA1C 5.8 (H) 05/11/2021   HGBA1C 5.6 07/15/2017   HGBA1C 5.5 03/27/2013   Lab Results  Component Value Date   INSULIN 17.6 05/11/2021   Lab Results  Component Value Date   TSH 0.933 05/11/2021   Lab Results  Component Value Date   CHOL 203 (H) 03/02/2021   HDL 76 03/02/2021   [place]  93 03/02/2021   TRIG 245 (H) 03/02/2021   CHOLHDL 2.7 03/02/2021   Lab Results  Component Value Date   WBC 11.4 (H) 05/11/2021   HGB 13.5 05/11/2021   HCT 42.0 05/11/2021   MCV 81 05/11/2021   PLT 216 05/11/2021   Lab Results  Component Value Date   IRON 113 05/11/2021   TIBC 398 05/11/2021   FERRITIN 24 05/11/2021   Attestation Statements:   Reviewed by clinician on day of visit: allergies, medications, problem list, medical history,  surgical history, family history, social history, and previous encounter notes.

## 2021-05-30 NOTE — Progress Notes (Signed)
Pt reports that she has had sxs x 2 days. Denies any f/s/c/n /v/d. She has been taking dayquil and tylenol. She tested 1 month ago this was negative and feels like this is just her sinuses. She has somewhat of a productive cough. Not much nasal drainage. She does have some post nasal drainage.   He husband has been sick x 2 wks he has been tested. Negative for covid.

## 2021-06-05 ENCOUNTER — Ambulatory Visit: Payer: Medicare Other | Admitting: Family Medicine

## 2021-06-05 DIAGNOSIS — G894 Chronic pain syndrome: Secondary | ICD-10-CM | POA: Diagnosis not present

## 2021-06-05 DIAGNOSIS — F419 Anxiety disorder, unspecified: Secondary | ICD-10-CM | POA: Diagnosis not present

## 2021-06-05 DIAGNOSIS — Z79899 Other long term (current) drug therapy: Secondary | ICD-10-CM | POA: Diagnosis not present

## 2021-06-05 DIAGNOSIS — G501 Atypical facial pain: Secondary | ICD-10-CM | POA: Diagnosis not present

## 2021-06-10 ENCOUNTER — Other Ambulatory Visit: Payer: Self-pay | Admitting: Family Medicine

## 2021-06-10 DIAGNOSIS — F3341 Major depressive disorder, recurrent, in partial remission: Secondary | ICD-10-CM

## 2021-06-19 ENCOUNTER — Other Ambulatory Visit: Payer: Self-pay

## 2021-06-19 ENCOUNTER — Telehealth (INDEPENDENT_AMBULATORY_CARE_PROVIDER_SITE_OTHER): Payer: Medicare Other | Admitting: Adult Health

## 2021-06-19 ENCOUNTER — Encounter (INDEPENDENT_AMBULATORY_CARE_PROVIDER_SITE_OTHER): Payer: Self-pay | Admitting: Adult Health

## 2021-06-19 DIAGNOSIS — G500 Trigeminal neuralgia: Secondary | ICD-10-CM

## 2021-06-19 DIAGNOSIS — Z6836 Body mass index (BMI) 36.0-36.9, adult: Secondary | ICD-10-CM | POA: Diagnosis not present

## 2021-06-19 DIAGNOSIS — E559 Vitamin D deficiency, unspecified: Secondary | ICD-10-CM

## 2021-06-20 NOTE — Progress Notes (Signed)
TeleHealth Visit:  Due to the COVID-19 pandemic, this visit was completed with telemedicine (audio/video) technology to reduce patient and provider exposure as well as to preserve personal protective equipment.   Desiree Wilson has verbally consented to this TeleHealth visit. The patient is located at home, the provider is located at the [facility] office. The participants in this visit include the listed provider and patient. The visit was conducted today via video.   Chief Complaint: OBESITY Desiree Wilson is here to discuss her progress with her obesity treatment plan along with follow-up of her obesity related diagnoses. Desiree Wilson is on the Category 2 Plan and states she is following her eating plan approximately 50% of the time. Desiree Wilson states she is not currently exercising.   Today's visit was #: 3 Starting weight: 216 lbs Starting date: 05/11/2021  Interim History: Desiree Wilson is experiencing exacerbation of trigeminal neuralgia- constant stabbing pain rated 10/10. She is 4 weeks past due on her quarterly ketamine infusion, managed by Desiree Wilson. She has been trying to follow low carb/high protein/low sugar. Of Note: Her dog, Bo, was on her lap during video visit- helping decrease anxiety/pain.  Subjective:   1. Trigeminal neuralgia Desiree Wilson is experiencing exacerbation of trigeminal neuralgia- constant stabbing pain rated 10/10. She is 4 weeks past due on her quarterly ketamine infusion, managed by Desiree Wilson.  2. Vitamin D deficiency Desiree Wilson's Vitamin D level was 18.9 on 05/11/2021- well below goal of 50. She is currently taking prescription vitamin D 50,000 IU each week. She denies nausea, vomiting or muscle weakness.  Assessment/Plan:   1. Trigeminal neuralgia Continue with current therapy at [facility] in [place]. Request to be scheduled for infusion therapy every 3 months for the next year to ensure regular treatment intervals.  2. Vitamin D  deficiency Low Vitamin D level contributes to fatigue and are associated with obesity, breast, and colon cancer. She agrees to continue to take prescription Vitamin D @50 ,000 IU every week and will follow-up for routine testing of Vitamin D, at least 2-3 times per year to avoid over-replacement.  3. Obesity, current BMI 35.7  Desiree Wilson is currently in the action stage of change. As such, her goal is to continue with weight loss efforts. She has agreed to the Category 2 Plan.   Exercise goals: No exercise has been prescribed at this time.  Behavioral modification strategies: increasing lean protein intake, decreasing simple carbohydrates, meal planning and cooking strategies, keeping healthy foods in the home, and planning for success.  Desiree Wilson has agreed to follow-up with our clinic in 2 weeks. She was informed of the importance of frequent follow-up visits to maximize her success with intensive lifestyle modifications for her multiple health conditions.  Objective:   VITALS: Per patient if applicable, see vitals. GENERAL: Alert and in no acute distress. CARDIOPULMONARY: No increased WOB. Speaking in clear sentences.  PSYCH: Pleasant and cooperative. Speech normal rate and rhythm. Affect is appropriate. Insight and judgement are appropriate. Attention is focused, linear, and appropriate.  NEURO: Oriented as arrived to appointment on time with no prompting.   Lab Results  Component Value Date   CREATININE 0.94 05/11/2021   BUN 22 05/11/2021   NA 136 05/11/2021   K 4.4 05/11/2021   CL 100 05/11/2021   CO2 16 (L) 05/11/2021   Lab Results  Component Value Date   ALT 17 05/11/2021   AST 20 05/11/2021   ALKPHOS 86 05/11/2021   BILITOT 0.2 05/11/2021   Lab Results  Component Value Date   HGBA1C 5.8 (H) 05/11/2021   HGBA1C 5.6 07/15/2017   HGBA1C 5.5 03/27/2013   Lab Results  Component Value Date   INSULIN 17.6 05/11/2021   Lab Results  Component Value Date   TSH 0.933 05/11/2021    Lab Results  Component Value Date   CHOL 203 (H) 03/02/2021   HDL 76 03/02/2021   LDLCALC 93 03/02/2021   TRIG 245 (H) 03/02/2021   CHOLHDL 2.7 03/02/2021   Lab Results  Component Value Date   WBC 11.4 (H) 05/11/2021   HGB 13.5 05/11/2021   HCT 42.0 05/11/2021   MCV 81 05/11/2021   PLT 216 05/11/2021   Lab Results  Component Value Date   IRON 113 05/11/2021   TIBC 398 05/11/2021   FERRITIN 24 05/11/2021    Attestation Statements:   Reviewed by clinician on day of visit: allergies, medications, problem list, medical history, surgical history, family history, social history, and previous encounter notes.  Time spent on visit including pre-visit chart review and post-visit charting and care was 30 minutes.   Coral Ceo, CMA, am acting as transcriptionist for Mina Marble, NP.  I have reviewed the above documentation for accuracy and completeness, and I agree with the above. - Shylee Durrett d. Alexiss Iturralde, NP-C

## 2021-06-29 DIAGNOSIS — G500 Trigeminal neuralgia: Secondary | ICD-10-CM | POA: Diagnosis not present

## 2021-06-29 DIAGNOSIS — G501 Atypical facial pain: Secondary | ICD-10-CM | POA: Diagnosis not present

## 2021-07-10 ENCOUNTER — Ambulatory Visit (INDEPENDENT_AMBULATORY_CARE_PROVIDER_SITE_OTHER): Payer: Medicare Other | Admitting: Family Medicine

## 2021-07-17 ENCOUNTER — Other Ambulatory Visit: Payer: Self-pay

## 2021-07-17 ENCOUNTER — Encounter: Payer: Self-pay | Admitting: Family Medicine

## 2021-07-17 ENCOUNTER — Ambulatory Visit (INDEPENDENT_AMBULATORY_CARE_PROVIDER_SITE_OTHER): Payer: Medicare Other | Admitting: Family Medicine

## 2021-07-17 VITALS — BP 125/73 | HR 84 | Ht 65.0 in | Wt 215.0 lb

## 2021-07-17 DIAGNOSIS — F5101 Primary insomnia: Secondary | ICD-10-CM | POA: Diagnosis not present

## 2021-07-17 DIAGNOSIS — F3341 Major depressive disorder, recurrent, in partial remission: Secondary | ICD-10-CM

## 2021-07-17 DIAGNOSIS — E66812 Obesity, class 2: Secondary | ICD-10-CM

## 2021-07-17 DIAGNOSIS — Z23 Encounter for immunization: Secondary | ICD-10-CM

## 2021-07-17 DIAGNOSIS — Z6836 Body mass index (BMI) 36.0-36.9, adult: Secondary | ICD-10-CM

## 2021-07-17 NOTE — Progress Notes (Addendum)
Established Patient Office Visit  Subjective:  Patient ID: Desiree Wilson, female    DOB: [birth year]  Age: 55 y.o. MRN: [id number]  CC:  Chief Complaint  Patient presents with   Follow-up    HPI Desiree Wilson presents for f/u -   She is here today for follow-up.  For her trigeminal neuralgia she was unable to receive some of her ketamine infusions so that has worsened her pain.  She is also been working with healthy weight and wellness for weight loss.  Her provider has recommended that we consider switching her Abilify to Lybalvi, which is more weight neutral.  More recently she has been try to get active she has been trying to do stationary bike for about 30 minutes when she goes to the gym.  She is still struggling a lot with her anxiety in particular.  She is on clonazepam 3 times daily through another provider.  She is also on trazodone at bedtime.  This she also reports she has not been sleeping well at all.  She usually takes 2 trazodone tabs.  She tries to go to bed around 1030 but then usually wakes up around 3:57 AM.  Says she has been getting about 4 to 5-1/2 hours of sleep at night.  She is also now on a higher dose of gabapentin.  Past Medical History:  Diagnosis Date   Allergy    rhinitis   Anemia    Anxiety    Blood transfusion    x2 1983 surgery blood loss   Blood transfusion without reported diagnosis    Depression    Depression    Facial paralysis    right face/ chronic pain syndrome (pain management Dr Sydell Axon)   HENIDPOE(423.5)    Hyperlipidemia    Hypotension    Iron deficiency anemia 11/15/2014   Iron malabsorption 11/15/2014   Osteoarthritis    Trigeminal neuralgia    right   Trigeminal neuralgia 2002    Past Surgical History:  Procedure Laterality Date   gamma knife Right 01/2018   MANDIBLE SURGERY     NASAL SINUS SURGERY     OSTEOTOMY     plastic facial surgery     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 mandible  surgery    Family History  Problem Relation Age of Onset   Cancer Mother    Obesity Mother    Stroke Father    Diverticulitis Father    Hypertension Father    Hyperlipidemia Father    Depression Father    Colon cancer Neg Hx     Social History   Socioeconomic History   Marital status: Significant Other    Spouse name: [name]   Number of children: 0   Years of education: Masters Degree   Highest education level: Master's degree (e.g., MA, MS, MEng, MEd, MSW, MBA)  Occupational History   Occupation: disabled/self employed  Tobacco Use   Smoking status: Some Days    Years: 6.00    Types: Cigarettes    Last attempt to quit: 12/31/2000    Years since quitting: 20.5   Smokeless tobacco: Never   Tobacco comments:    tobacco infor given  Vaping Use   Vaping Use: Some days   Start date: 06/30/2018   Substances: Nicotine  Substance and Sexual Activity   Alcohol use: No    Alcohol/week: 0.0 standard drinks   Drug use: No   Sexual activity: Yes    Partners: Male  Other Topics Concern   Not on file  Social History Narrative   Wants to start back exercising. Goal is to loose 20 to 40 lbs in the next year. Coffee in the morning and tea during the day   Social Determinants of Health   Financial Resource Strain: Not on file  Food Insecurity: Not on file  Transportation Needs: Not on file  Physical Activity: Not on file  Stress: Not on file  Social Connections: Not on file  Intimate Partner Violence: Not on file    Outpatient Medications Prior to Visit  Medication Sig Dispense Refill   Carboxymethylcellulose Sodium 1 % GEL Apply to eye as needed.     clonazePAM (KLONOPIN) 1 MG tablet Take 1 tablet (1 mg total) by mouth 2 (two) times daily. 30 tablet 2   drospirenone-ethinyl estradiol (YAZ) 3-0.02 MG tablet TAKE 1 TABLET BY MOUTH DAILY 84 tablet 3   ferrous fumarate (HEMOCYTE - 106 MG FE) 325 (106 Fe) MG TABS tablet Take 1 tablet by mouth.     fluticasone (FLONASE) 50 MCG/ACT  nasal spray Place 2 sprays into both nostrils daily. 16 g 6   frovatriptan (FROVA) 2.5 MG tablet Take 2.5 mg by mouth daily as needed.     gabapentin (NEURONTIN) 600 MG tablet Take 600 mg by mouth 3 (three) times daily.     IBUPROFEN PO Take by mouth as needed.     ketamine (KETALAR) 10 MG/ML injection Inject into the vein every 3 (three) months.      Multiple Vitamin (MULTIVITAMIN) capsule Take 1 capsule by mouth daily.     omeprazole (PRILOSEC) 40 MG capsule TAKE ONE CAPSULE BY MOUTH DAILY 90 capsule 3   Oxcarbazepine (TRILEPTAL) 300 MG tablet Take by mouth.     promethazine (PHENERGAN) 25 MG tablet Take 1 tablet (25 mg total) by mouth every 8 (eight) hours as needed for nausea or vomiting. 20 tablet 0   Pseudoephedrine-Acetaminophen (SINUS PO) Take by mouth as needed.     rosuvastatin (CRESTOR) 20 MG tablet TAKE 1 TABLET BY MOUTH DAILY 90 tablet 3   sertraline (ZOLOFT) 100 MG tablet TAKE 1 AND 1/2 TABLETS BY MOUTH DAILY 145 tablet 1   tiZANidine (ZANAFLEX) 4 MG tablet Take 4 mg by mouth every 6 (six) hours as needed.     traZODone (DESYREL) 100 MG tablet TAKE 2 TABLETS(200 MG) BY MOUTH AT BEDTIME AS NEEDED FOR SLEEP 90 tablet 1   vitamin B-12 (CYANOCOBALAMIN) 1000 MCG tablet Take 1,000 mcg by mouth daily.     vitamin C (ASCORBIC ACID) 500 MG tablet Take 500 mg by mouth daily.     Vitamin D, Ergocalciferol, (DRISDOL) 1.25 MG (50000 UNIT) CAPS capsule Take 1 capsule (50,000 Units total) by mouth every 7 (seven) days. 12 capsule 0   amoxicillin-clavulanate (AUGMENTIN) 875-125 MG tablet Take 1 tablet by mouth 2 (two) times daily. 14 tablet 0   ARIPiprazole (ABILIFY) 5 MG tablet Take 1 tablet (5 mg total) by mouth daily. 30 tablet 2   Carboxymethylcellulose Sodium (EYE DROPS OP) Apply to eye as needed.     No facility-administered medications prior to visit.    Allergies  Allergen Reactions   Azithromycin Rash    REACTION: C Diff Other reaction(s): Other (See Comments), Other (See  Comments) REACTION: C Diff REACTION: C Diff REACTION: C Diff    Methadone     Other reaction(s): Hallucinations, Other (See Comments), Other (See Comments) Hallucinations Hallucinations Hallucinations    Tapentadol Anxiety  Other reaction(s): Dizziness   Phenobarbital     REACTION: Excited    ROS Review of Systems    Objective:    Physical Exam  BP 125/73   Pulse 84   Ht '5\' 5"'  (1.651 m)   Wt 215 lb (97.5 kg)   SpO2 95%   BMI 35.78 kg/m  Wt Readings from Last 3 Encounters:  07/19/21 211 lb (95.7 kg)  07/17/21 215 lb (97.5 kg)  05/25/21 214 lb (97.1 kg)     Health Maintenance Due  Topic Date Due   Pneumococcal Vaccine 40-13 Years old (1 - PCV) Never done   HIV Screening  Never done   Hepatitis C Screening  Never done   COVID-19 Vaccine (4 - Booster for Pfizer series) 01/20/2021    There are no preventive care reminders to display for this patient.  Lab Results  Component Value Date   TSH 0.933 05/11/2021   Lab Results  Component Value Date   WBC 11.4 (H) 05/11/2021   HGB 13.5 05/11/2021   HCT 42.0 05/11/2021   MCV 81 05/11/2021   PLT 216 05/11/2021   Lab Results  Component Value Date   NA 136 05/11/2021   K 4.4 05/11/2021   CO2 16 (L) 05/11/2021   GLUCOSE 100 (H) 05/11/2021   BUN 22 05/11/2021   CREATININE 0.94 05/11/2021   BILITOT 0.2 05/11/2021   ALKPHOS 86 05/11/2021   AST 20 05/11/2021   ALT 17 05/11/2021   PROT 6.8 05/11/2021   ALBUMIN 4.4 05/11/2021   CALCIUM 9.3 05/11/2021   EGFR 72 05/11/2021   Lab Results  Component Value Date   CHOL 203 (H) 03/02/2021   Lab Results  Component Value Date   HDL 76 03/02/2021   Lab Results  Component Value Date   LDLCALC 93 03/02/2021   Lab Results  Component Value Date   TRIG 245 (H) 03/02/2021   Lab Results  Component Value Date   CHOLHDL 2.7 03/02/2021   Lab Results  Component Value Date   HGBA1C 5.8 (H) 05/11/2021      Assessment & Plan:   Problem List Items Addressed  This Visit       Other   Primary insomnia    Still struggling with sweet though she is already on 200 mg of trazodone.  I think some of it is just due to recent ramp-up in headaches and stress levels.  Hopefully she can get back on her ketamine infusions and get her headaches back under control that might make a difference.  She is at least getting about 4 to 5 hours of sleep though that can be exhausting as well.       Depression    Okay to discontinue the Abilify and switch to Lybalvi.  We can then follow-up in 6 to 8 weeks to make sure that she is doing well with the change.  I think it is reasonable to see if she tolerates the medication well and it is effective and will help her with her weight loss journey.       Class 2 severe obesity with serious comorbidity and body mass index (BMI) of 36.0 to 36.9 in adult [facility])    Continue to work with healthy weight and wellness.       Other Visit Diagnoses     Need for Zostavax administration    -  Primary   Relevant Orders   Varicella-zoster vaccine IM (Shingrix) (Completed)       Also  warned that increase dose of gabapentin can increase potential for sedation so just to be aware of that and monitoring for symptoms and side effects.  Meds ordered this encounter  Medications   OLANZapine-Samidorphan (LYBALVI) 5-10 MG TABS    Sig: Take 1 tablet by mouth daily.    Dispense:  30 tablet    Refill:  0     Follow-up: Return in about 7 weeks (around 09/01/2021) for New start medication.    [name], MD

## 2021-07-18 ENCOUNTER — Telehealth: Payer: Self-pay | Admitting: Family Medicine

## 2021-07-18 ENCOUNTER — Ambulatory Visit (INDEPENDENT_AMBULATORY_CARE_PROVIDER_SITE_OTHER): Payer: Medicare Other | Admitting: Family Medicine

## 2021-07-18 NOTE — Assessment & Plan Note (Signed)
Continue to work with healthy weight and wellness.

## 2021-07-18 NOTE — Telephone Encounter (Signed)
Please call patient and find out who is writing for her clonazepam now

## 2021-07-18 NOTE — Telephone Encounter (Signed)
Desiree Wilson states pain management writes for the clonazepam.   Desiree Bitter, MD   [address]   [address]   [place], [place] [address]   [phone] (Work)   [phone] (Fax)   Pain Management Care Supervisor

## 2021-07-18 NOTE — Assessment & Plan Note (Signed)
Okay to discontinue the Abilify and switch to Lybalvi.  We can then follow-up in 6 to 8 weeks to make sure that she is doing well with the change.  I think it is reasonable to see if she tolerates the medication well and it is effective and will help her with her weight loss journey.

## 2021-07-19 ENCOUNTER — Other Ambulatory Visit: Payer: Self-pay

## 2021-07-19 ENCOUNTER — Encounter (INDEPENDENT_AMBULATORY_CARE_PROVIDER_SITE_OTHER): Payer: Self-pay | Admitting: Family Medicine

## 2021-07-19 ENCOUNTER — Ambulatory Visit (INDEPENDENT_AMBULATORY_CARE_PROVIDER_SITE_OTHER): Payer: Medicare Other | Admitting: Family Medicine

## 2021-07-19 VITALS — BP 136/81 | HR 74 | Temp 98.0°F | Ht 65.0 in | Wt 211.0 lb

## 2021-07-19 DIAGNOSIS — Z6835 Body mass index (BMI) 35.0-35.9, adult: Secondary | ICD-10-CM | POA: Diagnosis not present

## 2021-07-19 DIAGNOSIS — R7303 Prediabetes: Secondary | ICD-10-CM

## 2021-07-19 DIAGNOSIS — R632 Polyphagia: Secondary | ICD-10-CM

## 2021-07-19 DIAGNOSIS — G894 Chronic pain syndrome: Secondary | ICD-10-CM | POA: Diagnosis not present

## 2021-07-21 MED ORDER — LYBALVI 5-10 MG PO TABS: 1.0000 | ORAL_TABLET | Freq: Every day | ORAL | 0 refills | Status: DC

## 2021-07-21 NOTE — Assessment & Plan Note (Signed)
Still struggling with sweet though she is already on 200 mg of trazodone.  I think some of it is just due to recent ramp-up in headaches and stress levels.  Hopefully she can get back on her ketamine infusions and get her headaches back under control that might make a difference.  She is at least getting about 4 to 5 hours of sleep though that can be exhausting as well.

## 2021-07-21 NOTE — Addendum Note (Signed)
Addended by: [name] D on: 07/21/2021 11:09 AM   Modules accepted: Orders

## 2021-07-26 NOTE — Telephone Encounter (Signed)
error 

## 2021-07-28 ENCOUNTER — Ambulatory Visit (INDEPENDENT_AMBULATORY_CARE_PROVIDER_SITE_OTHER): Payer: Medicare Other | Admitting: Physician Assistant

## 2021-07-28 ENCOUNTER — Other Ambulatory Visit: Payer: Self-pay

## 2021-07-28 ENCOUNTER — Ambulatory Visit (INDEPENDENT_AMBULATORY_CARE_PROVIDER_SITE_OTHER): Payer: Medicare Other

## 2021-07-28 ENCOUNTER — Encounter: Payer: Self-pay | Admitting: Physician Assistant

## 2021-07-28 VITALS — BP 140/69 | HR 86 | Temp 97.6°F | Ht 65.0 in | Wt 210.0 lb

## 2021-07-28 DIAGNOSIS — R197 Diarrhea, unspecified: Secondary | ICD-10-CM | POA: Diagnosis not present

## 2021-07-28 DIAGNOSIS — R1030 Lower abdominal pain, unspecified: Secondary | ICD-10-CM

## 2021-07-28 DIAGNOSIS — R109 Unspecified abdominal pain: Secondary | ICD-10-CM | POA: Diagnosis not present

## 2021-07-28 NOTE — Progress Notes (Signed)
Subjective:    Patient ID: Desiree Wilson, female    DOB: 02-Sep-1966, 55 y.o.   MRN: [id number]  HPI Pt is a 55 yo obese female with GERD, CKD who presents to the clinic with loose watery stools since Monday. She is having 7-10 a day. She is taking immodium and pepto bismol with no relief. Pt has had C.Diff before but no recent abx and stool does not have an odor. She has not started any new medications but did stop Abilify and got the shingrix. She is having lots of lower abdominal cramping and pain. No fever, chills, SOB, body aches. Has had covid before and does not feel like covid. No one else in home is sick. Not eating any new foods. No nausea or vomiting. No appetite. She is actively getting worse.   .. Active Ambulatory Problems    Diagnosis Date Noted   Hyperlipidemia 07/05/2009   Depression 07/05/2009   ALLERGIC RHINITIS 07/05/2009   OSTEOARTHRITIS 07/05/2009   HYPERSOMNIA 08/09/2009   FATIGUE 08/10/2010   HEADACHE 07/05/2009   ANKLE PAIN, LEFT 03/06/2011   Trigeminal neuralgia 07/22/2012   CKD (chronic kidney disease) stage 3, GFR 30-59 ml/min (HCC) 08/26/2014   Iron deficiency anemia 11/15/2014   Iron malabsorption 11/15/2014   Chronic insomnia 12/16/2014   Myofascial pain 02/29/2016   Vitamin D deficiency 06/06/2016   Gastroesophageal reflux disease with esophagitis 06/11/2016   Deafness in right ear 07/12/2016   Chronic diarrhea 07/15/2017   Menorrhagia 06/10/2020   Snoring 10/26/2020   BMI 38.0-38.9,adult 10/26/2020   Primary insomnia 02/02/2021   Prediabetes 05/30/2021   Low serum ferritin level 05/30/2021   B12 deficiency 05/30/2021   Chronic pain syndrome 05/30/2021   Recurrent major depressive disorder, in partial remission ([place]) 05/30/2021   Class 2 severe obesity with serious comorbidity and body mass index (BMI) of 36.0 to 36.9 in adult [facility]) 05/30/2021   Resolved Ambulatory Problems    Diagnosis Date Noted   CHRONIC KIDNEY DISEASE STAGE III  (MODERATE) 07/25/2010   RENAL INSUFFICIENCY 11/14/2009   Essential hypertension, benign 04/13/2011   Sinus congestion 02/02/2021   Past Medical History:  Diagnosis Date   Allergy    Anemia    Anxiety    Blood transfusion    Blood transfusion without reported diagnosis    Facial paralysis    Hypotension    Osteoarthritis     Review of Systems See HPI.     Objective:   Physical Exam Vitals reviewed.  Constitutional:      Appearance: Normal appearance. She is obese.  HENT:     Head: Normocephalic.  Cardiovascular:     Rate and Rhythm: Normal rate.  Pulmonary:     Effort: Pulmonary effort is normal.  Abdominal:     General: There is no distension.     Palpations: Abdomen is soft. There is no mass.     Tenderness: There is abdominal tenderness. There is guarding. There is no right CVA tenderness or left CVA tenderness.     Comments: Hypoactive bowel sounds. Guarding in right lower and left lower quadrant. Generalized tenderness in the epigastric area.   Musculoskeletal:     Right lower leg: No edema.     Left lower leg: No edema.  Neurological:     Mental Status: She is alert and oriented to person, place, and time.  Psychiatric:     Comments: Concern and anxiety.           Assessment & Plan:  Marland KitchenMarland Kitchen  Desiree Wilson was seen today for diarrhea.  Diagnoses and all orders for this visit:  Lower abdominal pain -     Cancel: CT Abdomen Pelvis W Contrast; Future -     CBC with Differential/Platelet -     COMPLETE METABOLIC PANEL WITH GFR -     CT Abdomen Pelvis Wo Contrast; Future -     Lipase  Diarrhea, unspecified type -     CBC with Differential/Platelet -     COMPLETE METABOLIC PANEL WITH GFR -     Lipase   concern for diverticulitis. Vitals are reassuring but pt had guarding on abdominal exam.  STAT CT.  Labs ordered.  Will call with next step in treatment plan.  Continue on prilosec. If normal consider stool testing however loose stool makes it difficult to  collect stool samples.

## 2021-07-28 NOTE — Progress Notes (Signed)
Chief Complaint:   OBESITY Desiree Wilson is here to discuss her progress with her obesity treatment plan along with follow-up of her obesity related diagnoses.   Today's visit was #: 4 Starting weight: 216 lbs Starting date: 05/11/2021 Today's weight: 211 lbs Today's date: 07/19/2021 Weight change since last visit: 3 lbs Total lbs lost to date: 5 lbs Body mass index is 35.11 kg/m.  Total weight loss percentage to date: -2.31%  Interim History:  [name] had a ketamine infusion 3 weeks ago.  She had a visit with the pain clinic yesterday.  She is still having 8/10 pain noon-evening.  She is having to take 3000-4000 mg of Neurontin daily.  She says she is walking at 6 am and working on portion control.  She endorses polyphagia.  She will see her PCP on Monday.  She says she stopped Abilify (ran out last Friday).  Current Meal Plan: the Category 2 Plan for 70% of the time.  Current Exercise Plan: Walking 1 mile/cardio 7 times per week.  Assessment/Plan:   1. Prediabetes Not optimized. Goal is HgbA1c < 5.7.  Medication: None.   Plan:  She will continue to focus on protein-rich, low simple carbohydrate foods. We reviewed the importance of hydration, regular exercise for stress reduction, and restorative sleep.   Lab Results  Component Value Date   HGBA1C 5.8 (H) 05/11/2021   Lab Results  Component Value Date   INSULIN 17.6 05/11/2021   2. Chronic pain syndrome Keeping her out of the gym.  Still walking. We will continue to monitor symptoms as they relate to her weight loss journey.  3. Polyphagia Not at goal. Current treatment: None. Polyphagia refers to excessive feelings of hunger.  Abilify stopped.  PCP is in the process of making sure that Lybalvi is safe for her. If she starts this, I expect polyphagia to decrease.  She will continue to focus on protein-rich, low simple carbohydrate foods. We reviewed the importance of hydration, regular exercise for stress reduction, and  restorative sleep.  4. Obesity with current BMI of 35.1  Course: Desiree Wilson is currently in the action stage of change. As such, her goal is to continue with weight loss efforts.   Nutrition goals: She has agreed to the Category 2 Plan.   Exercise goals:  As is.  Behavioral modification strategies: increasing lean protein intake, decreasing simple carbohydrates, increasing vegetables, increasing water intake, and emotional eating strategies.  [name] has agreed to follow-up with our clinic in 3 weeks. She was informed of the importance of frequent follow-up visits to maximize her success with intensive lifestyle modifications for her multiple health conditions.   Objective:   Blood pressure 136/81, pulse 74, temperature 98 F (36.7 C), temperature source Oral, height '5\' 5"'$  (1.651 m), weight 211 lb (95.7 kg), SpO2 95 %. Body mass index is 35.11 kg/m.  General: Cooperative, alert, well developed, in no acute distress. HEENT: Conjunctivae and lids unremarkable. Cardiovascular: Regular rhythm.  Lungs: Normal work of breathing. Neurologic: No focal deficits.   Lab Results  Component Value Date   CREATININE 0.94 05/11/2021   BUN 22 05/11/2021   NA 136 05/11/2021   K 4.4 05/11/2021   CL 100 05/11/2021   CO2 16 (L) 05/11/2021   Lab Results  Component Value Date   ALT 17 05/11/2021   AST 20 05/11/2021   ALKPHOS 86 05/11/2021   BILITOT 0.2 05/11/2021   Lab Results  Component Value Date   HGBA1C 5.8 (H) 05/11/2021  HGBA1C 5.6 07/15/2017   HGBA1C 5.5 03/27/2013   Lab Results  Component Value Date   INSULIN 17.6 05/11/2021   Lab Results  Component Value Date   TSH 0.933 05/11/2021   Lab Results  Component Value Date   CHOL 203 (H) 03/02/2021   HDL 76 03/02/2021   LDLCALC 93 03/02/2021   TRIG 245 (H) 03/02/2021   CHOLHDL 2.7 03/02/2021   Lab Results  Component Value Date   VD25OH 18.9 (L) 05/11/2021   VD25OH 13 (L) 05/14/2016   VD25OH 27 (L) 08/25/2014   Lab Results   Component Value Date   WBC 11.4 (H) 05/11/2021   HGB 13.5 05/11/2021   HCT 42.0 05/11/2021   MCV 81 05/11/2021   PLT 216 05/11/2021   Lab Results  Component Value Date   IRON 113 05/11/2021   TIBC 398 05/11/2021   FERRITIN 24 05/11/2021   Obesity Behavioral Intervention:   Approximately 15 minutes were spent on the discussion below.  ASK: We discussed the diagnosis of obesity with [name] Signs today and [name] agreed to give [place] permission to discuss obesity behavioral modification therapy today.  ASSESS: [name] has the diagnosis of obesity and her BMI today is 35.1. [name] is in the action stage of change.   ADVISE: [name] was educated on the multiple health risks of obesity as well as the benefit of weight loss to improve her health. She was advised of the need for long term treatment and the importance of lifestyle modifications to improve her current health and to decrease her risk of future health problems.  AGREE: Multiple dietary modification options and treatment options were discussed and [name] agreed to follow the recommendations documented in the above note.  ARRANGE: [name] was educated on the importance of frequent visits to treat obesity as outlined per CMS and USPSTF guidelines and agreed to schedule her next follow up appointment today.  Attestation Statements:   Reviewed by clinician on day of visit: allergies, medications, problem list, medical history, surgical history, family history, social history, and previous encounter notes.  I, Water quality scientist, CMA, am acting as transcriptionist for Briscoe Deutscher, DO  I have reviewed the above documentation for accuracy and completeness, and I agree with the above. Briscoe Deutscher, DO

## 2021-07-29 ENCOUNTER — Other Ambulatory Visit: Payer: Self-pay | Admitting: Physician Assistant

## 2021-07-29 LAB — COMPLETE METABOLIC PANEL WITH GFR
AG Ratio: 1.4 (calc) (ref 1.0–2.5)
ALT: 18 U/L (ref 6–29)
AST: 21 U/L (ref 10–35)
Albumin: 4.2 g/dL (ref 3.6–5.1)
Alkaline phosphatase (APISO): 81 U/L (ref 37–153)
BUN/Creatinine Ratio: 15 (calc) (ref 6–22)
BUN: 16 mg/dL (ref 7–25)
CO2: 22 mmol/L (ref 20–32)
Calcium: 9.9 mg/dL (ref 8.6–10.4)
Chloride: 106 mmol/L (ref 98–110)
Creat: 1.07 mg/dL — ABNORMAL HIGH (ref 0.50–1.03)
Globulin: 3.1 g/dL (calc) (ref 1.9–3.7)
Glucose, Bld: 100 mg/dL (ref 65–139)
Potassium: 4 mmol/L (ref 3.5–5.3)
Sodium: 138 mmol/L (ref 135–146)
Total Bilirubin: 0.3 mg/dL (ref 0.2–1.2)
Total Protein: 7.3 g/dL (ref 6.1–8.1)
eGFR: 62 mL/min/{1.73_m2} (ref >=60)

## 2021-07-29 LAB — CBC WITH DIFFERENTIAL/PLATELET
Absolute Monocytes: 781 cells/uL (ref 200–950)
Basophils Absolute: 24 cells/uL (ref 0–200)
Basophils Relative: 0.2 %
Eosinophils Absolute: 61 cells/uL (ref 15–500)
Eosinophils Relative: 0.5 %
HCT: 46.1 % — ABNORMAL HIGH (ref 35.0–45.0)
Hemoglobin: 14.7 g/dL (ref 11.7–15.5)
Lymphs Abs: 3013 cells/uL (ref 850–3900)
MCH: 25.9 pg — ABNORMAL LOW (ref 27.0–33.0)
MCHC: 31.9 g/dL — ABNORMAL LOW (ref 32.0–36.0)
MCV: 81.3 fL (ref 80.0–100.0)
MPV: 12.2 fL (ref 7.5–12.5)
Monocytes Relative: 6.4 %
Neutro Abs: 8320 cells/uL — ABNORMAL HIGH (ref 1500–7800)
Neutrophils Relative %: 68.2 %
Platelets: 234 10*3/uL (ref 140–400)
RBC: 5.67 10*6/uL — ABNORMAL HIGH (ref 3.80–5.10)
RDW: 14.3 % (ref 11.0–15.0)
Total Lymphocyte: 24.7 %
WBC: 12.2 10*3/uL — ABNORMAL HIGH (ref 3.8–10.8)

## 2021-07-29 LAB — LIPASE: Lipase: 29 U/L (ref 7–60)

## 2021-07-29 MED ORDER — DIPHENOXYLATE-ATROPINE 2.5-0.025 MG PO TABS: 1.0000 | ORAL_TABLET | Freq: Four times a day (QID) | ORAL | 0 refills | Status: DC | PRN

## 2021-07-29 NOTE — Progress Notes (Signed)
Called pt with normal results on 7/29 at 6:30. Likely diarrhea represents more viral gastroenteritis. Start lomotil. BRAT diet. Stay hydrated.  Still persisting on Monday will get stool culture and c.diff testing.

## 2021-07-29 NOTE — Progress Notes (Signed)
WBC up some but has been over the last 4 months. No acute worrisome changes. Close follow up with PCP if symptoms not resolving in next week.

## 2021-07-31 ENCOUNTER — Telehealth: Payer: Self-pay | Admitting: Physician Assistant

## 2021-07-31 NOTE — Telephone Encounter (Signed)
Call patient: see how she is doing? CT normal Friday. Lomotil sent in for diarrhea.

## 2021-07-31 NOTE — Telephone Encounter (Signed)
Can we call patient and see how she is doing. Lomotil sent Saturday for diarrhea. CT normal Friday.

## 2021-07-31 NOTE — Telephone Encounter (Signed)
Pt states she is improving. She states the abdominal pain and decreased a lot but there is still some mild discomfort present. She states her stools are starting to form and she has only been to the bathroom twice today.

## 2021-08-09 ENCOUNTER — Encounter (INDEPENDENT_AMBULATORY_CARE_PROVIDER_SITE_OTHER): Payer: Self-pay | Admitting: Family Medicine

## 2021-08-09 ENCOUNTER — Other Ambulatory Visit: Payer: Self-pay

## 2021-08-09 ENCOUNTER — Telehealth (INDEPENDENT_AMBULATORY_CARE_PROVIDER_SITE_OTHER): Payer: Medicare Other | Admitting: Family Medicine

## 2021-08-09 DIAGNOSIS — F329 Major depressive disorder, single episode, unspecified: Secondary | ICD-10-CM | POA: Diagnosis not present

## 2021-08-09 DIAGNOSIS — E559 Vitamin D deficiency, unspecified: Secondary | ICD-10-CM | POA: Diagnosis not present

## 2021-08-09 DIAGNOSIS — G894 Chronic pain syndrome: Secondary | ICD-10-CM

## 2021-08-09 DIAGNOSIS — Z6835 Body mass index (BMI) 35.0-35.9, adult: Secondary | ICD-10-CM

## 2021-08-09 DIAGNOSIS — R7303 Prediabetes: Secondary | ICD-10-CM

## 2021-08-09 MED ORDER — VITAMIN D (ERGOCALCIFEROL) 1.25 MG (50000 UNIT) PO CAPS: 50000.0000 [IU] | ORAL_CAPSULE | ORAL | 0 refills | Status: DC

## 2021-08-09 NOTE — Progress Notes (Addendum)
TeleHealth Visit:  Due to the COVID-19 pandemic, this visit was completed with telemedicine (audio/video) technology to reduce patient and provider exposure as well as to preserve personal protective equipment.   Desiree Wilson has verbally consented to this TeleHealth visit. The patient is located at home, the provider is located at the [facility] office. The participants in this visit include the listed provider and patient. The visit was conducted today via telephone.  Desiree Wilson was unable to use realtime audiovisual technology today and the telehealth visit was conducted via telephone (15 min call).  Chief Complaint: OBESITY Desiree Wilson is here to discuss her progress with her obesity treatment plan along with follow-up of her obesity related diagnoses. Desiree Wilson is on the Category 2 Plan but did not know the name and states she is following her eating plan approximately 60% of the time. Desiree Wilson states she is doing 0 minutes 0 times per week.  Today's visit was #: 5 Starting weight: 216 lbs Starting date: 05/11/2021  Interim History: Desiree Wilson has not weighed at home but feels she has lost weight. We are doing doing a virtual visit because she is having some diarrhea/GI issues. She has been following a bland diet.  She has been unable to exercise due to abdominal issues.  Subjective:   1. Vitamin D deficiency Desiree Wilson's Vitamin D is low at 18.9. She is on weekly prescription Vitamin D.  Lab Results  Component Value Date   VD25OH 18.9 (L) 05/11/2021   VD25OH 13 (L) 05/14/2016   VD25OH 27 (L) 08/25/2014    2. Major depressive disorder, remission status unspecified, unspecified whether recurrent Desiree Wilson feels her depression is well controlled on Zoloft.  She has been off of Abilify for 3 weeks. Her depression is managed by her PCP.  3. Prediabetes Desiree Wilson's last A1C was 5.8. She denies polyphagia currently but notes she is hungry quite a bit when she is not ill.  Lab Results  Component Value  Date   HGBA1C 5.8 (H) 05/11/2021   Lab Results  Component Value Date   INSULIN 17.6 05/11/2021    4. Chronic pain syndrome Desiree Wilson notes pain is currently well controlled on quarterly ketamine Infusion. She had one 6 weeks ago.  Assessment/Plan:   1. Vitamin D deficiency . We will refill prescription Vitamin D 50,000 IU every week for 1 month with no refills and Desiree Wilson will follow-up for routine testing of Vitamin D, at least 2-3 times per year to avoid over-replacement.  - Vitamin D, Ergocalciferol, (DRISDOL) 1.25 MG (50000 UNIT) CAPS capsule; Take 1 capsule (50,000 Units total) by mouth every 7 (seven) days.  Dispense: 12 capsule; Refill: 0  2. Major depressive disorder, remission status unspecified, unspecified whether recurrent Desiree Wilson will continue Zoloft.   3. Prediabetes We will consider starting Ozempic at future office visit after diarrhea improves. She will continue to work on weight loss, exercise, and decreasing simple carbohydrates to help decrease the risk of diabetes.    4. Chronic pain syndrome Desiree Wilson will follow up with pain management.  5. Obesity with current BMI of 35.11 Desiree Wilson is currently in the action stage of change. As such, her goal is to continue with weight loss efforts. She has agreed to the Category 2 Plan.   Exercise goals:  Other.  Behavioral modification strategies: increasing lean protein intake and decreasing simple carbohydrates.  Desiree Wilson has agreed to follow-up with our clinic in 3 weeks with Dr. Juleen Wilson.  Objective:   VITALS: Per patient if applicable, see vitals.  GENERAL: Alert and in no acute distress. CARDIOPULMONARY: No increased WOB. Speaking in clear sentences.  PSYCH: Pleasant and cooperative. Speech normal rate and rhythm. Affect is appropriate. Insight and judgement are appropriate. Attention is focused, linear, and appropriate.  NEURO: Oriented as arrived to appointment on time with no prompting.   Lab Results  Component Value Date    CREATININE 1.07 (H) 07/28/2021   BUN 16 07/28/2021   NA 138 07/28/2021   K 4.0 07/28/2021   CL 106 07/28/2021   CO2 22 07/28/2021   Lab Results  Component Value Date   ALT 18 07/28/2021   AST 21 07/28/2021   ALKPHOS 86 05/11/2021   BILITOT 0.3 07/28/2021   Lab Results  Component Value Date   HGBA1C 5.8 (H) 05/11/2021   HGBA1C 5.6 07/15/2017   HGBA1C 5.5 03/27/2013   Lab Results  Component Value Date   INSULIN 17.6 05/11/2021   Lab Results  Component Value Date   TSH 0.933 05/11/2021   Lab Results  Component Value Date   CHOL 203 (H) 03/02/2021   HDL 76 03/02/2021   LDLCALC 93 03/02/2021   TRIG 245 (H) 03/02/2021   CHOLHDL 2.7 03/02/2021   Lab Results  Component Value Date   VD25OH 18.9 (L) 05/11/2021   VD25OH 13 (L) 05/14/2016   VD25OH 27 (L) 08/25/2014   Lab Results  Component Value Date   WBC 12.2 (H) 07/28/2021   HGB 14.7 07/28/2021   HCT 46.1 (H) 07/28/2021   MCV 81.3 07/28/2021   PLT 234 07/28/2021   Lab Results  Component Value Date   IRON 113 05/11/2021   TIBC 398 05/11/2021   FERRITIN 24 05/11/2021    Attestation Statements:   Reviewed by clinician on day of visit: allergies, medications, problem list, medical history, surgical history, family history, social history, and previous encounter notes.  I, Lizbeth Bark, RMA, am acting as Location manager for [employer], [place].   I have reviewed the above documentation for accuracy and completeness, and I agree with the above. - Georgianne Fick, FNP

## 2021-08-11 ENCOUNTER — Telehealth (INDEPENDENT_AMBULATORY_CARE_PROVIDER_SITE_OTHER): Payer: Medicare Other | Admitting: Family Medicine

## 2021-08-11 ENCOUNTER — Encounter: Payer: Self-pay | Admitting: Family Medicine

## 2021-08-11 DIAGNOSIS — R197 Diarrhea, unspecified: Secondary | ICD-10-CM | POA: Diagnosis not present

## 2021-08-11 NOTE — Progress Notes (Signed)
Abd pain & diarrhea x 3 weeks.   Only 2 days without diarrhea. No GI. If referred to GI she would like a Scientist, research (physical sciences).

## 2021-08-11 NOTE — Assessment & Plan Note (Signed)
Acute on chronic episode of diarrhea.  Recent CT scan and labs were unremarkable.  Checking C. difficile and stool culture.  If these are normal we discussed referral to gastroenterology.

## 2021-08-11 NOTE — Progress Notes (Signed)
Desiree Wilson - 55 y.o. female MRN [id number]  Date of birth: 1966-04-30   This visit type was conducted due to national recommendations for restrictions regarding the COVID-19 Pandemic (e.g. social distancing).  This format is felt to be most appropriate for this patient at this time.  All issues noted in this document were discussed and addressed.  No physical exam was performed (except for noted visual exam findings with Video Visits).  I discussed the limitations of evaluation and management by telemedicine and the availability of in person appointments. The patient expressed understanding and agreed to proceed.  I connected withNAME@ on 08/11/21 at 11:10 AM EDT by a video enabled telemedicine application and verified that I am speaking with the correct person using two identifiers.  Present at visit: Desiree Nutting, DO Desiree Wilson   Patient Location: Home [address] [place] [address]   Provider location:   [facility]  Chief Complaint  Patient presents with   Diarrhea   Abdominal Pain    HPI  Desiree Wilson is a 55 y.o. female who presents via audio/video conferencing for a telehealth visit today.  She has complaint of continued abdominal pain and diarrhea.  This has been ongoing for a few weeks.  In reviewing her history this does appear to also be a chronic issue for her.  She did have labs and CT scan recently at visit with [facility] and these were unremarkable.  She does continue to have symptoms at this point, which are different than her chronic diarrhea pattern.  She has not noted any blood in her stool.  She has been following a bland diet and taking Lomotil as needed.  She denies any nausea, fever or chills.   ROS:  A comprehensive ROS was completed and negative except as noted per HPI  Past Medical History:  Diagnosis Date   Allergy    rhinitis   Anemia    Anxiety    Blood transfusion    x2 1983 surgery blood loss   Blood transfusion without reported  diagnosis    Depression    Depression    Facial paralysis    right face/ chronic pain syndrome (pain management Dr Sydell Axon)   [id number])    Hyperlipidemia    Hypotension    Iron deficiency anemia 11/15/2014   Iron malabsorption 11/15/2014   Osteoarthritis    Trigeminal neuralgia    right   Trigeminal neuralgia 2002    Past Surgical History:  Procedure Laterality Date   gamma knife Right 01/2018   MANDIBLE SURGERY     NASAL SINUS SURGERY     OSTEOTOMY     plastic facial surgery     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    Family History  Problem Relation Age of Onset   Cancer Mother    Obesity Mother    Stroke Father    Diverticulitis Father    Hypertension Father    Hyperlipidemia Father    Depression Father    Colon cancer Neg Hx     Social History   Socioeconomic History   Marital status: Significant Other    Spouse name: [name]   Number of children: 0   Years of education: Masters Degree   Highest education level: Master's degree (e.g., MA, MS, MEng, MEd, MSW, MBA)  Occupational History   Occupation: disabled/self employed  Tobacco Use   Smoking status: Some Days    Years: 6.00  Types: Cigarettes    Last attempt to quit: 12/31/2000    Years since quitting: 20.6   Smokeless tobacco: Never   Tobacco comments:    tobacco infor given  Vaping Use   Vaping Use: Some days   Start date: 06/30/2018   Substances: Nicotine  Substance and Sexual Activity   Alcohol use: No    Alcohol/week: 0.0 standard drinks   Drug use: No   Sexual activity: Yes    Partners: Male  Other Topics Concern   Not on file  Social History Narrative   Wants to start back exercising. Goal is to loose 20 to 40 lbs in the next year. Coffee in the morning and tea during the day   Social Determinants of Health   Financial Resource Strain: Not on file  Food Insecurity: Not on file  Transportation Needs: Not on file  Physical Activity: Not on file   Stress: Not on file  Social Connections: Not on file  Intimate Partner Violence: Not on file     Current Outpatient Medications:    Carboxymethylcellulose Sodium 1 % GEL, Apply to eye as needed., Disp: , Rfl:    clonazePAM (KLONOPIN) 1 MG tablet, Take 1 tablet (1 mg total) by mouth 2 (two) times daily., Disp: 30 tablet, Rfl: 2   diphenoxylate-atropine (LOMOTIL) 2.5-0.025 MG tablet, Take 1 tablet by mouth 4 (four) times daily as needed for diarrhea or loose stools., Disp: 30 tablet, Rfl: 0   drospirenone-ethinyl estradiol (YAZ) 3-0.02 MG tablet, TAKE 1 TABLET BY MOUTH DAILY, Disp: 84 tablet, Rfl: 3   ferrous fumarate (HEMOCYTE - 106 MG FE) 325 (106 Fe) MG TABS tablet, Take 1 tablet by mouth., Disp: , Rfl:    fluticasone (FLONASE) 50 MCG/ACT nasal spray, Place 2 sprays into both nostrils daily., Disp: 16 g, Rfl: 6   frovatriptan (FROVA) 2.5 MG tablet, Take 2.5 mg by mouth daily as needed., Disp: , Rfl:    gabapentin (NEURONTIN) 600 MG tablet, Take 600 mg by mouth 3 (three) times daily., Disp: , Rfl:    IBUPROFEN PO, Take by mouth as needed., Disp: , Rfl:    ketamine (KETALAR) 10 MG/ML injection, Inject into the vein every 3 (three) months. , Disp: , Rfl:    Multiple Vitamin (MULTIVITAMIN) capsule, Take 1 capsule by mouth daily., Disp: , Rfl:    omeprazole (PRILOSEC) 40 MG capsule, TAKE ONE CAPSULE BY MOUTH DAILY, Disp: 90 capsule, Rfl: 3   Oxcarbazepine (TRILEPTAL) 300 MG tablet, Take by mouth., Disp: , Rfl:    promethazine (PHENERGAN) 25 MG tablet, Take 1 tablet (25 mg total) by mouth every 8 (eight) hours as needed for nausea or vomiting., Disp: 20 tablet, Rfl: 0   Pseudoephedrine-Acetaminophen (SINUS PO), Take by mouth as needed., Disp: , Rfl:    rosuvastatin (CRESTOR) 20 MG tablet, TAKE 1 TABLET BY MOUTH DAILY, Disp: 90 tablet, Rfl: 3   sertraline (ZOLOFT) 100 MG tablet, TAKE 1 AND 1/2 TABLETS BY MOUTH DAILY, Disp: 145 tablet, Rfl: 1   tiZANidine (ZANAFLEX) 4 MG tablet, Take 4 mg by mouth  every 6 (six) hours as needed., Disp: , Rfl:    traZODone (DESYREL) 100 MG tablet, TAKE 2 TABLETS(200 MG) BY MOUTH AT BEDTIME AS NEEDED FOR SLEEP, Disp: 90 tablet, Rfl: 1   vitamin B-12 (CYANOCOBALAMIN) 1000 MCG tablet, Take 1,000 mcg by mouth daily., Disp: , Rfl:    vitamin C (ASCORBIC ACID) 500 MG tablet, Take 500 mg by mouth daily., Disp: , Rfl:  Vitamin D, Ergocalciferol, (DRISDOL) 1.25 MG (50000 UNIT) CAPS capsule, Take 1 capsule (50,000 Units total) by mouth every 7 (seven) days., Disp: 12 capsule, Rfl: 0  EXAM:  VITALS per patient if applicable: There were no vitals taken for this visit.  GENERAL: alert, oriented, appears well and in no acute distress  HEENT: atraumatic, conjunttiva clear, no obvious abnormalities on inspection of external nose and ears  NECK: normal movements of the head and neck  LUNGS: on inspection no signs of respiratory distress, breathing rate appears normal, no obvious gross SOB, gasping or wheezing  CV: no obvious cyanosis  MS: moves all visible extremities without noticeable abnormality  PSYCH/NEURO: pleasant and cooperative, no obvious depression or anxiety, speech and thought processing grossly intact  ASSESSMENT AND PLAN:  Discussed the following assessment and plan:  Diarrhea Acute on chronic episode of diarrhea.  Recent CT scan and labs were unremarkable.  Checking C. difficile and stool culture.  If these are normal we discussed referral to gastroenterology.     I discussed the assessment and treatment plan with the patient. The patient was provided an opportunity to ask questions and all were answered. The patient agreed with the plan and demonstrated an understanding of the instructions.   The patient was advised to call back or seek an in-person evaluation if the symptoms worsen or if the condition fails to improve as anticipated.    Desiree Nutting, DO

## 2021-08-14 DIAGNOSIS — R197 Diarrhea, unspecified: Secondary | ICD-10-CM | POA: Diagnosis not present

## 2021-08-15 LAB — C. DIFFICILE GDH AND TOXIN A/B
GDH ANTIGEN: NOT DETECTED
MICRO NUMBER:: 12247158
SPECIMEN QUALITY:: ADEQUATE
TOXIN A AND B: NOT DETECTED

## 2021-08-18 ENCOUNTER — Telehealth: Payer: Self-pay

## 2021-08-18 DIAGNOSIS — R197 Diarrhea, unspecified: Secondary | ICD-10-CM

## 2021-08-18 NOTE — Telephone Encounter (Signed)
Pt called wanting to know the results of c.diffe.  Advised pt that results were negative.  Pt states that she is still having diarrhea and has had 5 episodes this morning since 8am and has had some nausea x 2 days.  She describes the diarrhea as watery and denies any abdominal cramping and fevers.  Please advise.  Charyl Bigger, CMA

## 2021-08-18 NOTE — Telephone Encounter (Signed)
Okay to place referral to GI.

## 2021-08-18 NOTE — Telephone Encounter (Signed)
LVM informing pt of referral.  Charyl Bigger, CMA

## 2021-08-21 ENCOUNTER — Other Ambulatory Visit: Payer: Self-pay | Admitting: Family Medicine

## 2021-08-21 DIAGNOSIS — F5101 Primary insomnia: Secondary | ICD-10-CM

## 2021-08-23 DIAGNOSIS — H5213 Myopia, bilateral: Secondary | ICD-10-CM | POA: Diagnosis not present

## 2021-08-23 NOTE — Telephone Encounter (Signed)
Meds ordered this encounter  Medications   traZODone (DESYREL) 100 MG tablet    Sig: TAKE 2 TABLETS(200 MG) BY MOUTH AT BEDTIME AS NEEDED FOR SLEEP    Dispense:  180 tablet    Refill:  1

## 2021-08-25 ENCOUNTER — Encounter: Payer: Self-pay | Admitting: Family Medicine

## 2021-08-30 ENCOUNTER — Encounter (INDEPENDENT_AMBULATORY_CARE_PROVIDER_SITE_OTHER): Payer: Self-pay | Admitting: Family Medicine

## 2021-08-30 ENCOUNTER — Telehealth (INDEPENDENT_AMBULATORY_CARE_PROVIDER_SITE_OTHER): Payer: Medicare Other | Admitting: Family Medicine

## 2021-08-30 ENCOUNTER — Encounter (INDEPENDENT_AMBULATORY_CARE_PROVIDER_SITE_OTHER): Payer: Self-pay

## 2021-08-30 ENCOUNTER — Other Ambulatory Visit: Payer: Self-pay

## 2021-08-30 DIAGNOSIS — R197 Diarrhea, unspecified: Secondary | ICD-10-CM | POA: Diagnosis not present

## 2021-08-30 DIAGNOSIS — F325 Major depressive disorder, single episode, in full remission: Secondary | ICD-10-CM | POA: Diagnosis not present

## 2021-08-30 DIAGNOSIS — G894 Chronic pain syndrome: Secondary | ICD-10-CM

## 2021-08-30 DIAGNOSIS — Z6835 Body mass index (BMI) 35.0-35.9, adult: Secondary | ICD-10-CM

## 2021-08-30 NOTE — Progress Notes (Signed)
TeleHealth Visit:  Due to the COVID-19 pandemic, this visit was completed with telemedicine (audio/video) technology to reduce patient and provider exposure as well as to preserve personal protective equipment.   Gaylan has verbally consented to this TeleHealth visit. The patient is located at home, the provider is located at the [facility] office. The participants in this visit include the listed provider and patient. The visit was conducted today via MyChart video.  Chief Complaint: OBESITY Haddi is here to discuss her progress with her obesity treatment plan along with follow-up of her obesity related diagnoses. Sequia is on the Category 2 Plan and states she is following her eating plan approximately 0% of the time. Teaja states she is not exercising regularly.  Today's visit was #: 6 Starting weight: 216 lbs Starting date: 05/11/2021  Interim History: Kellsey has had diarrhea for 6 weeks.  She says she has been drinking water.  CT within normal limits, labs reassuring.  GI referral pending.  Pain has been controlled since restarting ketamine infusions.  She says that her mood is great.  She says she stopped Abilify and did not need other medication support.  She has an emotional support dog.  Assessment/Plan:   1. Diarrhea, x 6 weeks New. CT within normal limits, labs reassuring.  Currently has GI referral pending. We will continue to monitor symptoms as they relate to her weight loss journey.  2. Chronic pain syndrome, controlled currently Improving. She has restarted ketamine infusions, which is helping her pain. We will continue to monitor symptoms as they relate to her weight loss journey.  3. Major depressive disorder in full remission, unspecified whether recurrent ([place]) Improving. She is no longer taking Abilify and does not feel that she needs other medication support at this time. We will continue to monitor symptoms as they relate to her weight loss  journey.  4. Obesity with current BMI of 35.11  Jettie is currently in the action stage of change. As such, her goal is to continue with weight loss efforts. She has agreed to agrees to follow a low FODMAP/low residue diet.   Exercise goals:  Increase activity as tolerated.  Behavioral modification strategies: increasing water intake.  We discussed low residue/low FODMAP diets.  Maximina has agreed to follow-up with our clinic in 4 weeks. She was informed of the importance of frequent follow-up visits to maximize her success with intensive lifestyle modifications for her multiple health conditions.  Objective:   VITALS: Per patient if applicable, see vitals. GENERAL: Alert and in no acute distress. CARDIOPULMONARY: No increased WOB. Speaking in clear sentences.  PSYCH: Pleasant and cooperative. Speech normal rate and rhythm. Affect is appropriate. Insight and judgement are appropriate. Attention is focused, linear, and appropriate.  NEURO: Oriented as arrived to appointment on time with no prompting.   Lab Results  Component Value Date   CREATININE 1.07 (H) 07/28/2021   BUN 16 07/28/2021   NA 138 07/28/2021   K 4.0 07/28/2021   CL 106 07/28/2021   CO2 22 07/28/2021   Lab Results  Component Value Date   ALT 18 07/28/2021   AST 21 07/28/2021   ALKPHOS 86 05/11/2021   BILITOT 0.3 07/28/2021   Lab Results  Component Value Date   HGBA1C 5.8 (H) 05/11/2021   HGBA1C 5.6 07/15/2017   HGBA1C 5.5 03/27/2013   Lab Results  Component Value Date   INSULIN 17.6 05/11/2021   Lab Results  Component Value Date   TSH 0.933  05/11/2021   Lab Results  Component Value Date   CHOL 203 (H) 03/02/2021   HDL 76 03/02/2021   LDLCALC 93 03/02/2021   TRIG 245 (H) 03/02/2021   CHOLHDL 2.7 03/02/2021   Lab Results  Component Value Date   VD25OH 18.9 (L) 05/11/2021   VD25OH 13 (L) 05/14/2016   VD25OH 27 (L) 08/25/2014   Lab Results  Component Value Date   WBC 12.2 (H) 07/28/2021   HGB  14.7 07/28/2021   HCT 46.1 (H) 07/28/2021   MCV 81.3 07/28/2021   PLT 234 07/28/2021   Lab Results  Component Value Date   IRON 113 05/11/2021   TIBC 398 05/11/2021   FERRITIN 24 05/11/2021   Attestation Statements:   Reviewed by clinician on day of visit: allergies, medications, problem list, medical history, surgical history, family history, social history, and previous encounter notes.  I, Water quality scientist, CMA, am acting as transcriptionist for Briscoe Deutscher, DO  I have reviewed the above documentation for accuracy and completeness, and I agree with the above. Briscoe Deutscher, DO

## 2021-09-05 DIAGNOSIS — G894 Chronic pain syndrome: Secondary | ICD-10-CM | POA: Diagnosis not present

## 2021-09-05 DIAGNOSIS — Z79899 Other long term (current) drug therapy: Secondary | ICD-10-CM | POA: Diagnosis not present

## 2021-09-05 DIAGNOSIS — G501 Atypical facial pain: Secondary | ICD-10-CM | POA: Diagnosis not present

## 2021-09-05 DIAGNOSIS — G500 Trigeminal neuralgia: Secondary | ICD-10-CM | POA: Diagnosis not present

## 2021-09-07 ENCOUNTER — Telehealth: Payer: Self-pay | Admitting: Lab

## 2021-09-07 NOTE — Progress Notes (Signed)
  Chronic Care Management   Outreach Note  09/07/2021 Name: [name] MRN: [id number] DOB: 12/10/1966  Referred by: Hali Marry, MD Reason for referral : Medication Management   An unsuccessful telephone outreach was attempted today. The patient was referred to the pharmacist for assistance with care management and care coordination.   Follow Up Plan:   Desiree Wilson

## 2021-09-07 NOTE — Chronic Care Management (AMB) (Signed)
  Chronic Care Management   Note  09/07/2021 Name: Desiree Wilson MRN: [id number] DOB: 04-24-1966  Desiree Wilson is a 55 y.o. year old female who is a primary care patient of Madilyn Fireman, Rene Kocher, MD. I reached out to [name] by phone today in response to a referral sent by Ms. Thedora Hinders PCP, Hali Marry, MD.   Desiree Wilson was given information about Chronic Care Management services today including:  CCM service includes personalized support from designated clinical staff supervised by her physician, including individualized plan of care and coordination with other care providers 24/7 contact phone numbers for assistance for urgent and routine care needs. Service will only be billed when office clinical staff spend 20 minutes or more in a month to coordinate care. Only one practitioner may furnish and bill the service in a calendar month. The patient may stop CCM services at any time (effective at the end of the month) by phone call to the office staff.   Patient agreed to services and verbal consent obtained.   Follow up plan:   [place]

## 2021-09-08 ENCOUNTER — Telehealth: Payer: Self-pay | Admitting: Family Medicine

## 2021-09-08 NOTE — Telephone Encounter (Signed)
Left message for patient to call back and schedule Medicare Annual Wellness Visit (AWV) on  Saturday Sept 10,2022 to do by phone or video 8-12  If unable to do Saturday, please get scheduled virtually/telephone on day during the week  Last AWV  10/27/2018 Please schedule at any time with [facility] Advisor.      40 Minutes appointment   Any questions, please call me at [phone]

## 2021-09-12 ENCOUNTER — Ambulatory Visit: Payer: Medicare Other

## 2021-09-20 ENCOUNTER — Telehealth (INDEPENDENT_AMBULATORY_CARE_PROVIDER_SITE_OTHER): Payer: Medicare Other | Admitting: Physician Assistant

## 2021-09-20 ENCOUNTER — Other Ambulatory Visit: Payer: Self-pay

## 2021-09-20 DIAGNOSIS — E66812 Obesity, class 2: Secondary | ICD-10-CM

## 2021-09-20 DIAGNOSIS — R197 Diarrhea, unspecified: Secondary | ICD-10-CM | POA: Diagnosis not present

## 2021-09-20 DIAGNOSIS — Z6835 Body mass index (BMI) 35.0-35.9, adult: Secondary | ICD-10-CM | POA: Diagnosis not present

## 2021-09-20 NOTE — Progress Notes (Signed)
TeleHealth Visit:  Due to the COVID-19 pandemic, this visit was completed with telemedicine (audio/video) technology to reduce patient and provider exposure as well as to preserve personal protective equipment.   Desiree Wilson has verbally consented to this TeleHealth visit. The patient is located at home, the provider is located at the [facility] office. The participants in this visit include the listed provider and patient. The visit was conducted today via video.  Chief Complaint: OBESITY Desiree Wilson is here to discuss her progress with her obesity treatment plan along with follow-up of her obesity related diagnoses. Desiree Wilson is on the FODMAP/ low residue diet and states she is following her eating plan approximately 0% of the time. Lavonya states she is doing 0 minutes 0 times per week.  Today's visit was #: 7 Starting weight: 216 lbs Starting date: 05/11/2021  Interim History: Desiree Wilson is having diarrhea and is at home today. She will see GI next week. She is drinking G2 and doing a FODMAP diet but continues to have diarrhea. She is not able to eat much throughout the day.  Subjective:   1. Diarrhea, x 10 weeks Desiree Wilson has been sticking with FODMAP diet but it has not decreased diarrhea.   Assessment/Plan:   1. Diarrhea, x 10 weeks Desiree Wilson will see GI next week.  2. Obesity with current BMI of 34.95 Desiree Wilson is currently in the action stage of change. As such, her goal is to continue with weight loss efforts. She has agreed to the FODMAP.  Exercise goals: No exercise has been prescribed at this time.  Behavioral modification strategies: increasing lean protein intake, increasing water intake, and meal planning and cooking strategies.  Desiree Wilson has agreed to follow-up with our clinic in 3 weeks. She was informed of the importance of frequent follow-up visits to maximize her success with intensive lifestyle modifications for her multiple health conditions.  Objective:   VITALS: Per  patient if applicable, see vitals. GENERAL: Alert and in no acute distress. CARDIOPULMONARY: No increased WOB. Speaking in clear sentences.  PSYCH: Pleasant and cooperative. Speech normal rate and rhythm. Affect is appropriate. Insight and judgement are appropriate. Attention is focused, linear, and appropriate.  NEURO: Oriented as arrived to appointment on time with no prompting.   Lab Results  Component Value Date   CREATININE 1.07 (H) 07/28/2021   BUN 16 07/28/2021   NA 138 07/28/2021   K 4.0 07/28/2021   CL 106 07/28/2021   CO2 22 07/28/2021   Lab Results  Component Value Date   ALT 18 07/28/2021   AST 21 07/28/2021   ALKPHOS 86 05/11/2021   BILITOT 0.3 07/28/2021   Lab Results  Component Value Date   HGBA1C 5.8 (H) 05/11/2021   HGBA1C 5.6 07/15/2017   HGBA1C 5.5 03/27/2013   Lab Results  Component Value Date   INSULIN 17.6 05/11/2021   Lab Results  Component Value Date   TSH 0.933 05/11/2021   Lab Results  Component Value Date   CHOL 203 (H) 03/02/2021   HDL 76 03/02/2021   LDLCALC 93 03/02/2021   TRIG 245 (H) 03/02/2021   CHOLHDL 2.7 03/02/2021   Lab Results  Component Value Date   VD25OH 18.9 (L) 05/11/2021   VD25OH 13 (L) 05/14/2016   VD25OH 27 (L) 08/25/2014   Lab Results  Component Value Date   WBC 12.2 (H) 07/28/2021   HGB 14.7 07/28/2021   HCT 46.1 (H) 07/28/2021   MCV 81.3 07/28/2021   PLT 234 07/28/2021  Lab Results  Component Value Date   IRON 113 05/11/2021   TIBC 398 05/11/2021   FERRITIN 24 05/11/2021    Attestation Statements:   Reviewed by clinician on day of visit: allergies, medications, problem list, medical history, surgical history, family history, social history, and previous encounter notes.  I, Tonye Pearson, am acting as Location manager for [employer], PA-C.   I have reviewed the above documentation for accuracy and completeness, and I agree with the above. Abby Potash, PA-C

## 2021-09-27 ENCOUNTER — Ambulatory Visit: Payer: Medicare Other | Admitting: Nurse Practitioner

## 2021-09-27 ENCOUNTER — Other Ambulatory Visit (INDEPENDENT_AMBULATORY_CARE_PROVIDER_SITE_OTHER): Payer: Medicare Other

## 2021-09-27 ENCOUNTER — Encounter: Payer: Self-pay | Admitting: Nurse Practitioner

## 2021-09-27 VITALS — BP 120/70 | HR 70 | Ht 65.0 in | Wt 212.1 lb

## 2021-09-27 DIAGNOSIS — R197 Diarrhea, unspecified: Secondary | ICD-10-CM | POA: Diagnosis not present

## 2021-09-27 DIAGNOSIS — D509 Iron deficiency anemia, unspecified: Secondary | ICD-10-CM | POA: Diagnosis not present

## 2021-09-27 DIAGNOSIS — K219 Gastro-esophageal reflux disease without esophagitis: Secondary | ICD-10-CM

## 2021-09-27 DIAGNOSIS — A048 Other specified bacterial intestinal infections: Secondary | ICD-10-CM

## 2021-09-27 LAB — CBC WITH DIFFERENTIAL/PLATELET
Basophils Absolute: 0.1 10*3/uL (ref 0.0–0.1)
Basophils Relative: 0.5 % (ref 0.0–3.0)
Eosinophils Absolute: 0.1 10*3/uL (ref 0.0–0.7)
Eosinophils Relative: 0.9 % (ref 0.0–5.0)
HCT: 41.5 % (ref 36.0–46.0)
Hemoglobin: 13.6 g/dL (ref 12.0–15.0)
Lymphocytes Relative: 17.4 % (ref 12.0–46.0)
Lymphs Abs: 2 10*3/uL (ref 0.7–4.0)
MCHC: 32.8 g/dL (ref 30.0–36.0)
MCV: 80.4 fl (ref 78.0–100.0)
Monocytes Absolute: 0.5 10*3/uL (ref 0.1–1.0)
Monocytes Relative: 4.3 % (ref 3.0–12.0)
Neutro Abs: 8.8 10*3/uL — ABNORMAL HIGH (ref 1.4–7.7)
Neutrophils Relative %: 76.9 % (ref 43.0–77.0)
Platelets: 191 10*3/uL (ref 150.0–400.0)
RBC: 5.16 Mil/uL — ABNORMAL HIGH (ref 3.87–5.11)
RDW: 14.9 % (ref 11.5–15.5)
WBC: 11.4 10*3/uL — ABNORMAL HIGH (ref 4.0–10.5)

## 2021-09-27 LAB — COMPREHENSIVE METABOLIC PANEL
ALT: 9 U/L (ref 0–35)
AST: 12 U/L (ref 0–37)
Albumin: 4 g/dL (ref 3.5–5.2)
Alkaline Phosphatase: 86 U/L (ref 39–117)
BUN: 15 mg/dL (ref 6–23)
CO2: 25 mEq/L (ref 19–32)
Calcium: 9.3 mg/dL (ref 8.4–10.5)
Chloride: 102 mEq/L (ref 96–112)
Creatinine, Ser: 1.09 mg/dL (ref 0.40–1.20)
GFR: 57.38 mL/min — ABNORMAL LOW (ref >60.00)
Glucose, Bld: 106 mg/dL — ABNORMAL HIGH (ref 70–99)
Potassium: 4.3 mEq/L (ref 3.5–5.1)
Sodium: 137 mEq/L (ref 135–145)
Total Bilirubin: 0.3 mg/dL (ref 0.2–1.2)
Total Protein: 7.7 g/dL (ref 6.0–8.3)

## 2021-09-27 LAB — C-REACTIVE PROTEIN: CRP: 4 mg/dL (ref 0.5–20.0)

## 2021-09-27 MED ORDER — SUTAB 1479-225-188 MG PO TABS: 1.0000 | ORAL_TABLET | ORAL | 0 refills | Status: DC

## 2021-09-27 NOTE — Progress Notes (Signed)
09/27/2021 CALIA NAPP [id number] 1966/02/14   CHIEF COMPLAINT: Diarrhea  HISTORY OF PRESENT ILLNESS:  Hart Carwin. Slay is a 55 year old female with a past medical history of anxiety, depression, obesity, suspected sleep apnea, hyperlipidemia, migraine headaches, trigeminal neuralgia, IDA, C. Difficile, chronic diarrhea and GERD. She presents to our office today as referred by Dr. [name] for further evaluation for diarrhea x 10 weeks. She typically passed a normal formed brown bowel movement most days.  She received the shingles vaccination and the next day she developed abrupt onset of nonbloody watery diarrhea.  She continued to have watery diarrhea approximately 7 episodes daily for few weeks. C. Diff testing 08/14/2021 was negative.  She took 2 different probiotics for few weeks without improvement.  She went on a brat diet then a FODMAP diet.  She is now passing mud like stools 4 times daily and she infrequently passes small bits of formed stool.  No rectal bleeding.  No new medications or antibiotics within the past 6 months.  No NSAID use.  She has a history of intermittent diarrhea in the past and she underwent a colonoscopy by Dr. Havery Moros 10/07/2016 which identified 3 tubular adenomatous polyps and biopsies of the right colon were negative for microscopic colitis.  He is near tearful and frustrated regarding her continued diarrhea bowel pattern as it is interfering with her quality of living.  She is attempting to lose weight and is followed by a bariatric specialist who completed laboratory studies approximately 5 months ago which showed she was iron, B12 and vitamin D deficient.  She is taking oral iron supplement every other day.  Her stools are black in color the day after she takes iron supplement.  She has a history of GERD symptoms for which she takes Prilosec daily for the past 5 to 6 years.  If she skips a few doses of Prilosec she develops nausea, heartburn and  stomach pain.  She also has mild dysphagia, describes feeling as if food is somewhat slower to pass down the esophagus which occurs a few days weekly since she was diagnosed with trigeminal neuralgia.  She denies ever having an EGD.  She is drinking a protein plant-based supplement 2-3 times daily.  She avoids fresh fruits and vegetables.  She is on continuous oral birth control pills without menstrual cycles.  No known family history of esophageal, gastric or colon cancer.  She has trigeminal neuralgia (resulted in nerve injury during a jaw surgery in 1983, s/p gamma knife treatment 2002) for which she receives Ketamine infusions every 3 months by her pain management specialist in [place].   Colonoscopy 12/07/2016: - Non-thrombosed external hemorrhoids found on perianal exam. - One 3 mm polyp at the ileocecal valve, removed with a cold biopsy forceps. Resected and retrieved. - One 6 mm polyp at the hepatic flexure, removed with a cold snare. Resected and retrieved. - One 5 mm polyp in the descending colon, removed with a cold snare. Resected and retrieved. - Internal hemorrhoids. - 5 year colonoscopy recall - The examination was otherwise normal. No inflammatory changes - Retroflexion not performed due to small size of rectum. - Biopsies were taken with a cold forceps from the right colon Diagnosis 1. Surgical [P], ileocecal valve, hepatic flexure, descending, polyp (3) -TUBULAR ADENOMAS AND BENIGN COLONIC MUCOSA. -NO HIGH GRADE DYSPLASIA OR MALIGNANCY IDENTIFIED. 2. Surgical [P], random right colon -BENIGN COLONIC MUCOSA WITH NO HISTOPATHOLOGIC ABNORMALITY. -NO EVIDENCE OF LYMPHOCYTIC OR COLLAGENOUS COLITIS, INFLAMMATORY BOWEL  DISEASE OR MALIGNANCY  CBC Latest Ref Rng & Units 07/28/2021 05/11/2021 03/02/2021  WBC 3.8 - 10.8 Thousand/uL 12.2(H) 11.4(H) 11.4(H)  Hemoglobin 11.7 - 15.5 g/dL 14.7 13.5 12.6  Hematocrit 35.0 - 45.0 % 46.1(H) 42.0 37.7  Platelets 140 - 400 Thousand/uL 234  216 185    CMP Latest Ref Rng & Units 07/28/2021 05/11/2021 03/02/2021  Glucose 65 - 139 mg/dL 100 100(H) 134  BUN 7 - 25 mg/dL 16 22 20   Creatinine 0.50 - 1.03 mg/dL 1.07(H) 0.94 1.06(H)  Sodium 135 - 146 mmol/L 138 136 137  Potassium 3.5 - 5.3 mmol/L 4.0 4.4 4.1  Chloride 98 - 110 mmol/L 106 100 105  CO2 20 - 32 mmol/L 22 16(L) 23  Calcium 8.6 - 10.4 mg/dL 9.9 9.3 9.2  Total Protein 6.1 - 8.1 g/dL 7.3 6.8 6.6  Total Bilirubin 0.2 - 1.2 mg/dL 0.3 0.2 0.2  Alkaline Phos 44 - 121 IU/L - 86 -  AST 10 - 35 U/L 21 20 18   ALT 6 - 29 U/L 18 17 13   TSH 0.933 on 05/11/2021  CTAP with without contrast 07/28/2021: Lower chest: No acute findings.  Hepatobiliary: No mass visualized on this unenhanced exam. Gallbladder is unremarkable. No evidence of biliary ductal dilatation.  Pancreas: No mass or inflammatory process visualized on this unenhanced exam.  Spleen:  Within normal limits in size.  Adrenals/Urinary tract: No evidence of urolithiasis or hydronephrosis. Unremarkable unopacified urinary bladder.  Stomach/Bowel: No evidence of obstruction, inflammatory process, or abnormal fluid collections. Normal appendix visualized.  Vascular/Lymphatic: No pathologically enlarged lymph nodes identified. No evidence of abdominal aortic aneurysm.  Reproductive:  No mass or other significant abnormality.  Musculoskeletal:  No suspicious bone lesions identified.  IMPRESSION: No acute findings or other significant abnormality.  Past Medical History:  Diagnosis Date   Allergy    rhinitis   Anemia    Anxiety    Blood transfusion    x2 1983 surgery blood loss   Blood transfusion without reported diagnosis    Depression    Depression    Facial paralysis    right face/ chronic pain syndrome (pain management Dr Sydell Axon)   RFXJOITG(549.8)    Hyperlipidemia    Hypotension    Iron deficiency anemia 11/15/2014   Iron malabsorption 11/15/2014   Osteoarthritis    Trigeminal neuralgia    right    Trigeminal neuralgia 2002   Past Surgical History:  Procedure Laterality Date   gamma knife Right 01/2018   MANDIBLE SURGERY     NASAL SINUS SURGERY     OSTEOTOMY     plastic facial surgery     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  She smokes cigarettes < 1/2 ppd quit smoking on a regular basis age 78 to age 85.  She drinks 2 to 3 cocktails monthly. Smokes marijuana 3 times monthly.   Family History: Mother had breast cancer. Father Alzheimer's disease, CVA, history of HTN, diverticulitis and depression. Maternal grandfather had lung cancer and emphysema. Maternal grandmother with dementia.   Allergies  Allergen Reactions   Azithromycin Rash    REACTION: C Diff Other reaction(s): Other (See Comments), Other (See Comments) REACTION: C Diff REACTION: C Diff REACTION: C Diff    Methadone     Other reaction(s): Hallucinations, Other (See Comments), Other (See Comments) Hallucinations Hallucinations Hallucinations    Tapentadol Anxiety    Other reaction(s): Dizziness   Phenobarbital  REACTION: Excited      Outpatient Encounter Medications as of 09/27/2021  Medication Sig   Carboxymethylcellulose Sodium 1 % GEL Apply to eye as needed.   clonazePAM (KLONOPIN) 1 MG tablet Take 1 tablet (1 mg total) by mouth 2 (two) times daily.   diphenoxylate-atropine (LOMOTIL) 2.5-0.025 MG tablet Take 1 tablet by mouth 4 (four) times daily as needed for diarrhea or loose stools.   drospirenone-ethinyl estradiol (YAZ) 3-0.02 MG tablet TAKE 1 TABLET BY MOUTH DAILY   ferrous fumarate (HEMOCYTE - 106 MG FE) 325 (106 Fe) MG TABS tablet Take 1 tablet by mouth.   fluticasone (FLONASE) 50 MCG/ACT nasal spray Place 2 sprays into both nostrils daily.   frovatriptan (FROVA) 2.5 MG tablet Take 2.5 mg by mouth daily as needed.   gabapentin (NEURONTIN) 600 MG tablet Take 600 mg by mouth 3 (three) times daily.   IBUPROFEN PO Take  by mouth as needed.   ketamine (KETALAR) 10 MG/ML injection Inject into the vein every 3 (three) months.    Multiple Vitamin (MULTIVITAMIN) capsule Take 1 capsule by mouth daily.   omeprazole (PRILOSEC) 40 MG capsule TAKE ONE CAPSULE BY MOUTH DAILY   Oxcarbazepine (TRILEPTAL) 300 MG tablet Take by mouth.   Probiotic Product (PROBIOTIC PO) Take by mouth.   promethazine (PHENERGAN) 25 MG tablet Take 1 tablet (25 mg total) by mouth every 8 (eight) hours as needed for nausea or vomiting.   Pseudoephedrine-Acetaminophen (SINUS PO) Take by mouth as needed.   rosuvastatin (CRESTOR) 20 MG tablet TAKE 1 TABLET BY MOUTH DAILY   sertraline (ZOLOFT) 100 MG tablet TAKE 1 AND 1/2 TABLETS BY MOUTH DAILY   tiZANidine (ZANAFLEX) 4 MG tablet Take 4 mg by mouth every 6 (six) hours as needed.   traZODone (DESYREL) 100 MG tablet TAKE 2 TABLETS(200 MG) BY MOUTH AT BEDTIME AS NEEDED FOR SLEEP   vitamin B-12 (CYANOCOBALAMIN) 1000 MCG tablet Take 1,000 mcg by mouth daily.   vitamin C (ASCORBIC ACID) 500 MG tablet Take 500 mg by mouth daily.   Vitamin D, Ergocalciferol, (DRISDOL) 1.25 MG (50000 UNIT) CAPS capsule Take 1 capsule (50,000 Units total) by mouth every 7 (seven) days.   No facility-administered encounter medications on file as of 09/27/2021.   REVIEW OF SYSTEMS:  Gen: + Fatigue. Denies fever, sweats or chills. No weight loss.  CV: Denies chest pain, palpitations or edema. Resp: Denies cough, shortness of breath of hemoptysis.  GI: See HPI. GU : Denies urinary burning, blood in urine, increased urinary frequency or incontinence. MS: Back pain, muscle cramps in legs. Derm: Denies rash, itchiness, skin lesions or unhealing ulcers. Psych: + Anxiety and depression. Heme: Denies bruising, bleeding. Neuro: Headaches.  Trigeminal neuralgia. Endo:  Denies any problems with DM, thyroid or adrenal function.  PHYSICAL EXAM: BP 120/70   Pulse 70   Ht 5\' 5"  (1.651 m)   Wt 212 lb 2 oz (96.2 kg)   BMI 35.30  kg/m   Wt Readings from Last 3 Encounters:  09/27/21 212 lb 2 oz (96.2 kg)  07/28/21 210 lb (95.3 kg)  07/19/21 211 lb (95.7 kg)    General: 55 year old female in no acute distress, briefly tearful. Head: Normocephalic and atraumatic. Face: Facial asymmetry secondary to right trigeminal neuralgia. Eyes:  Sclerae non-icteric, conjunctive pink. Ears: Normal auditory acuity. Mouth: Dentition intact. No ulcers or lesions.  Patient is able to open jaw appropriately, posterior tongue remains slightly elevated on exam but uvula identified with effort when patient states ah.  Neck: Supple, no lymphadenopathy or thyromegaly.  Lungs: Clear bilaterally to auscultation without wheezes, crackles or rhonchi. Heart: Regular rate and rhythm. No murmur, rub or gallop appreciated.  Abdomen: Soft, nontender, non distended. No masses. No hepatosplenomegaly. Normoactive bowel sounds x 4 quadrants. Small raised erythematous patch to upper suprapubic area consistent with resolving folliculitis.  Rectal: Deferred.  Musculoskeletal: Symmetrical with no gross deformities. Skin: Warm and dry. No rash or lesions on visible extremities. Extremities: No edema. Neurological: Alert oriented x 4, no focal deficits.  Psychological:  Alert and cooperative. Normal mood and affect.  ASSESSMENT AND PLAN:  57) 55 year old female with a past history of intermittent diarrhea with daily nonbloody diarrhea x  2 1/2 months. No associated abdominal pain.  C. difficile  test x1 negative. -GI pathogen panel, CBC, CMP -Florastor probiotic 1 p.o. twice daily -Colonoscopy benefits and risks discussed including risk with sedation, risk of bleeding, perforation and infection   2) History of  tubular adenomatous colon polyps per colonoscopy 2017 -Colonoscopy as ordered above   3) History of GERD symptoms, controlled on PPI QD but if she skips one dose she develops heartburn and stomach pain. Chronic intermittent dysphagia thought to  be due to trigeminal neuralgia.  -EGD at time of colonoscopy, Dr. Havery Moros to verify if any contraindication regarding EGD with history of right trigeminal neuralgia. EGD benefits and risks discussed including risk with sedation, risk of bleeding, perforation and infection  -Continue Omeprazole 40 mg daily for now  4) History of IDA. No overt GI blood loss.  -TTG, IgA level  5) History of vitamin B12 deficiency on oral B12 supplementation   6) History of trigeminal neuralgia secondary to neve injury during a jaw surgery for migraine headaches in 1983. She receives Ketamine infusions Q 3 months per pain management. Patient denies any problems with sedation/anesthesia or airway management/intubation during any past procedure    CC:  Hali Marry, *

## 2021-09-27 NOTE — Progress Notes (Signed)
Agree with assessment and plan as outlined. As long as she can open her mouth without pain, or having that cause her pain, I think okay to proceed with EGD as part of her evaluation. Thanks

## 2021-09-27 NOTE — Patient Instructions (Addendum)
PROCEDURES: You have been scheduled for an EGD and Colonoscopy. Please follow the written instructions given to you at your visit today. If you use inhalers (even only as needed), please bring them with you on the day of your procedure.  Your Provider has sent your bowel prep to [facility]. Fast,Free delivery or shipping. Accept all major insurance benefits. Applies discounts and coupons.  What to expect: [facility] will contact you to verify your information and collect your co-pay, if applicable. Sit back and relax-your prescription will be delivered or shipped at no charge.  Have Additional questions? Call [phone]  LABS:  Lab work has been ordered for you today. Our lab is located in the basement. Press "B" on the elevator. The lab is located at the first door on the left as you exit the elevator.  HEALTHCARE LAWS AND MY CHART RESULTS: Due to recent changes in healthcare laws, you may see the results of your imaging and laboratory studies on MyChart before your provider has had a chance to review them.   We understand that in some cases there may be results that are confusing or concerning to you. Not all laboratory results come back in the same time frame and the provider may be waiting for multiple results in order to interpret others.  Please give [place] 48 hours in order for your provider to thoroughly review all the results before contacting the office for clarification of your results.   RECOMMENDATIONS: Florastor Probiotic twice a day. Please call our office if your symptoms worsen.  It was great seeing you today! Thank you for entrusting me with your care and choosing [facility].  Noralyn Pick, CRNP  The [facility] providers would like to encourage you to use [facility] to communicate with providers for non-urgent requests or questions.  Due to long hold times on the telephone, sending your provider a message by [facility] may be faster and more  efficient way to get a response. Please allow 48 business hours for a response.  Please remember that this is for non-urgent requests/questions. If you are age 1 or older, your body mass index should be between 23-30. Your Body mass index is 35.3 kg/m. If this is out of the aforementioned range listed, please consider follow up with your Primary Care Provider.  If you are age 19 or younger, your body mass index should be between 19-25. Your Body mass index is 35.3 kg/m. If this is out of the aformentioned range listed, please consider follow up with your Primary Care Provider.

## 2021-09-28 ENCOUNTER — Other Ambulatory Visit: Payer: Medicare Other

## 2021-09-28 DIAGNOSIS — D509 Iron deficiency anemia, unspecified: Secondary | ICD-10-CM | POA: Diagnosis not present

## 2021-09-28 DIAGNOSIS — A048 Other specified bacterial intestinal infections: Secondary | ICD-10-CM | POA: Diagnosis not present

## 2021-09-28 DIAGNOSIS — K219 Gastro-esophageal reflux disease without esophagitis: Secondary | ICD-10-CM | POA: Diagnosis not present

## 2021-09-28 DIAGNOSIS — R197 Diarrhea, unspecified: Secondary | ICD-10-CM | POA: Diagnosis not present

## 2021-09-28 LAB — TISSUE TRANSGLUTAMINASE ABS,IGG,IGA
(tTG) Ab, IgA: <1 U/mL
(tTG) Ab, IgG: <1 U/mL

## 2021-09-28 LAB — IGA: Immunoglobulin A: 153 mg/dL (ref 47–310)

## 2021-09-29 ENCOUNTER — Other Ambulatory Visit: Payer: Self-pay | Admitting: Family Medicine

## 2021-10-01 LAB — GI PROFILE, STOOL, PCR

## 2021-10-04 ENCOUNTER — Encounter: Payer: Self-pay | Admitting: Hematology & Oncology

## 2021-10-11 ENCOUNTER — Ambulatory Visit (INDEPENDENT_AMBULATORY_CARE_PROVIDER_SITE_OTHER): Payer: Medicare Other | Admitting: Family Medicine

## 2021-10-12 ENCOUNTER — Encounter: Payer: Self-pay | Admitting: Gastroenterology

## 2021-10-12 ENCOUNTER — Ambulatory Visit (AMBULATORY_SURGERY_CENTER): Payer: Medicare Other | Admitting: Gastroenterology

## 2021-10-12 ENCOUNTER — Other Ambulatory Visit: Payer: Self-pay

## 2021-10-12 VITALS — BP 118/56 | HR 67 | Temp 97.5°F | Resp 26 | Ht 65.0 in | Wt 212.0 lb

## 2021-10-12 DIAGNOSIS — K219 Gastro-esophageal reflux disease without esophagitis: Secondary | ICD-10-CM | POA: Diagnosis not present

## 2021-10-12 DIAGNOSIS — K529 Noninfective gastroenteritis and colitis, unspecified: Secondary | ICD-10-CM | POA: Diagnosis not present

## 2021-10-12 DIAGNOSIS — K5730 Diverticulosis of large intestine without perforation or abscess without bleeding: Secondary | ICD-10-CM | POA: Diagnosis not present

## 2021-10-12 DIAGNOSIS — K648 Other hemorrhoids: Secondary | ICD-10-CM | POA: Diagnosis not present

## 2021-10-12 DIAGNOSIS — K317 Polyp of stomach and duodenum: Secondary | ICD-10-CM

## 2021-10-12 DIAGNOSIS — K2970 Gastritis, unspecified, without bleeding: Secondary | ICD-10-CM

## 2021-10-12 DIAGNOSIS — Z86010 Personal history of colonic polyps: Secondary | ICD-10-CM | POA: Diagnosis not present

## 2021-10-12 DIAGNOSIS — D122 Benign neoplasm of ascending colon: Secondary | ICD-10-CM | POA: Diagnosis not present

## 2021-10-12 DIAGNOSIS — K2980 Duodenitis without bleeding: Secondary | ICD-10-CM | POA: Diagnosis not present

## 2021-10-12 DIAGNOSIS — R1310 Dysphagia, unspecified: Secondary | ICD-10-CM | POA: Diagnosis not present

## 2021-10-12 DIAGNOSIS — D509 Iron deficiency anemia, unspecified: Secondary | ICD-10-CM

## 2021-10-12 DIAGNOSIS — E538 Deficiency of other specified B group vitamins: Secondary | ICD-10-CM

## 2021-10-12 DIAGNOSIS — K319 Disease of stomach and duodenum, unspecified: Secondary | ICD-10-CM | POA: Diagnosis not present

## 2021-10-12 MED ORDER — SODIUM CHLORIDE 0.9 % IV SOLN: 500.0000 mL | Freq: Once | INTRAVENOUS | Status: DC

## 2021-10-12 NOTE — Progress Notes (Signed)
History and Physical Interval Note: 55 y/o female here for EGD and colonoscopy today for issues as outlined below. She denies any changes in her clinical status since when seen on 09/27/21. Feels about the same. Negative infectious workup. Labs mostly unremarkable. I have discussed risks / benefits of these procedures with her and she wishes to proceed. Further recommendations pending the results.   10/12/2021 1:33 PM  Desiree Wilson  has presented today for endoscopic procedure(s), with the diagnosis of  Encounter Diagnoses  Name Primary?   Chronic diarrhea Yes   Iron deficiency anemia, unspecified iron deficiency anemia type    History of colon polyps    Gastroesophageal reflux disease, unspecified whether esophagitis present    Dysphagia, unspecified type    B12 deficiency   .  The various methods of evaluation and treatment have been discussed with the patient and/or family. After consideration of risks, benefits and other options for treatment, the patient has consented to  the endoscopic procedure(s).   The patient's history has been reviewed, patient examined, no change in status, stable for surgery.  I have reviewed the patient's chart and labs.  Questions were answered to the patient's satisfaction.    Jolly Mango, MD [facility]

## 2021-10-12 NOTE — Op Note (Signed)
[place] Patient Name: Desiree Wilson Procedure Date: 10/12/2021 1:05 PM MRN: [id number] Endoscopist: Remo Lipps P. Havery Moros , MD Age: 55 Referring MD:  Date of Birth: 03-19-1966 Gender: Female Account #: [id number] Procedure:                Colonoscopy Indications:              Chronic diarrhea, Iron deficiency anemia, B12                            deficiency, history of colon polyps 10/17 - 3                            adenomas Medicines:                Monitored Anesthesia Care Procedure:                Pre-Anesthesia Assessment:                           - Prior to the procedure, a History and Physical                            was performed, and patient medications and                            allergies were reviewed. The patient's tolerance of                            previous anesthesia was also reviewed. The risks                            and benefits of the procedure and the sedation                            options and risks were discussed with the patient.                            All questions were answered, and informed consent                            was obtained. Prior Anticoagulants: The patient has                            taken no previous anticoagulant or antiplatelet                            agents. ASA Grade Assessment: III - A patient with                            severe systemic disease. After reviewing the risks                            and benefits, the patient was deemed in  satisfactory condition to undergo the procedure.                           After obtaining informed consent, the colonoscope                            was passed under direct vision. Throughout the                            procedure, the patient's blood pressure, pulse, and                            oxygen saturations were monitored continuously. The                            Endoscope was introduced through the anus and                             advanced to the the terminal ileum, with                            identification of the appendiceal orifice and IC                            valve. The colonoscopy was performed without                            difficulty. The patient tolerated the procedure                            well. The quality of the bowel preparation was                            adequate. The terminal ileum, ileocecal valve,                            appendiceal orifice, and rectum were photographed. Scope In: 1:48:21 PM Scope Out: 2:09:32 PM Scope Withdrawal Time: 0 hours 16 minutes 40 seconds  Total Procedure Duration: 0 hours 21 minutes 11 seconds  Findings:                 The perianal and digital rectal examinations were                            normal.                           The terminal ileum appeared normal.                           Two sessile polyps were found in the ascending                            colon. The polyps were 3 to 4 mm in size. These  polyps were removed with a cold snare. Resection                            and retrieval were complete.                           A single medium-mouthed diverticulum was found in                            the ascending colon.                           Internal hemorrhoids were found during                            retroflexion. The hemorrhoids were small.                           The exam was otherwise without abnormality. Several                            minutes spent lavaging the colon to achieve                            adequate views.                           Biopsies for histology were taken with a cold                            forceps from the right colon, left colon and                            transverse colon for evaluation of microscopic                            colitis. Complications:            No immediate complications. Estimated blood loss:                             Minimal. Estimated Blood Loss:     Estimated blood loss was minimal. Impression:               - The examined portion of the ileum was normal.                           - Two 3 to 4 mm polyps in the ascending colon,                            removed with a cold snare. Resected and retrieved.                           - Diverticulosis in the ascending colon.                           -  Internal hemorrhoids.                           - The examination was otherwise normal.                           - Biopsies were taken with a cold forceps from the                            right colon, left colon and transverse colon for                            evaluation of microscopic colitis. Recommendation:           - Patient has a contact number available for                            emergencies. The signs and symptoms of potential                            delayed complications were discussed with the                            patient. Return to normal activities tomorrow.                            Written discharge instructions were provided to the                            patient.                           - Resume previous diet.                           - Continue present medications.                           - Await pathology results with further                            recommendations regarding management of loose                            stools.                           - No overt cause for iron deficiency on EGD and                            colonoscopy, will await pathology results Remo Lipps P. Levie Wages, MD 10/12/2021 2:14:44 PM This report has been signed electronically.

## 2021-10-12 NOTE — Progress Notes (Signed)
Pt Drowsy. VSS. To PACU, report to RN. No anesthetic complications noted.  

## 2021-10-12 NOTE — Progress Notes (Signed)
C.W. vital signs. 

## 2021-10-12 NOTE — Progress Notes (Signed)
Called to room to assist during endoscopic procedure.  Patient ID and intended procedure confirmed with present staff. Received instructions for my participation in the procedure from the performing physician.  

## 2021-10-12 NOTE — Patient Instructions (Addendum)
Handouts given for polyps, hiatal hernia and diverticulosis.  YOU HAD AN ENDOSCOPIC PROCEDURE TODAY AT [facility]:   Refer to the procedure report that was given to you for any specific questions about what was found during the examination.  If the procedure report does not answer your questions, please call your gastroenterologist to clarify.  If you requested that your care partner not be given the details of your procedure findings, then the procedure report has been included in a sealed envelope for you to review at your convenience later.  YOU SHOULD EXPECT: Some feelings of bloating in the abdomen. Passage of more gas than usual.  Walking can help get rid of the air that was put into your GI tract during the procedure and reduce the bloating. If you had a lower endoscopy (such as a colonoscopy or flexible sigmoidoscopy) you may notice spotting of blood in your stool or on the toilet paper. If you underwent a bowel prep for your procedure, you may not have a normal bowel movement for a few days.  Please Note:  You might notice some irritation and congestion in your nose or some drainage.  This is from the oxygen used during your procedure.  There is no need for concern and it should clear up in a day or so.  SYMPTOMS TO REPORT IMMEDIATELY:  Following lower endoscopy (colonoscopy):  Excessive amounts of blood in the stool  Significant tenderness or worsening of abdominal pains  Swelling of the abdomen that is new, acute  Fever of 100F or higher  Following upper endoscopy (EGD)  Vomiting of blood or coffee ground material  New chest pain or pain under the shoulder blades  Painful or persistently difficult swallowing  New shortness of breath  Black, tarry-looking stools  For urgent or emergent issues, a gastroenterologist can be reached at any hour by calling [phone]. Do not use MyChart messaging for urgent concerns.    DIET:  We do recommend a small meal at  first, but then you may proceed to your regular diet.  Drink plenty of fluids but you should avoid alcoholic beverages for 24 hours.  ACTIVITY:  You should plan to take it easy for the rest of today and you should NOT DRIVE or use heavy machinery until tomorrow (because of the sedation medicines used during the test).    FOLLOW UP: Our staff will call the number listed on your records 48-72 hours following your procedure to check on you and address any questions or concerns that you may have regarding the information given to you following your procedure. If we do not reach you, we will leave a message.  We will attempt to reach you two times.  During this call, we will ask if you have developed any symptoms of COVID 19. If you develop any symptoms (ie: fever, flu-like symptoms, shortness of breath, cough etc.) before then, please call [phone].  If you test positive for Covid 19 in the 2 weeks post procedure, please call and report this information to [place].    If any biopsies were taken you will be contacted by phone or by letter within the next 1-3 weeks.  Please call us at [phone] if you have not heard about the biopsies in 3 weeks.    SIGNATURES/CONFIDENTIALITY: You and/or your care partner have signed paperwork which will be entered into your electronic medical record.  These signatures attest to the fact that that the information above on your After  Visit Summary has been reviewed and is understood.  Full responsibility of the confidentiality of this discharge information lies with you and/or your care-partner.

## 2021-10-12 NOTE — Progress Notes (Signed)
Pt's states no medical or surgical changes since previsit or office visit. 

## 2021-10-12 NOTE — Op Note (Signed)
[place] Patient Name: Desiree Wilson Procedure Date: 10/12/2021 1:07 PM MRN: [id number] Endoscopist: Remo Lipps P. Havery Moros , MD Age: 55 Referring MD:  Date of Birth: 1966-03-15 Gender: Female Account #: [id number] Procedure:                Upper GI endoscopy Indications:              Iron deficiency anemia, B!2 deficiency, mild                            dysphagia (patient thinks due to intability to chew                            well secondary to trigeminal neuralgia), Follow-up                            of gastro-esophageal reflux disease - on omeprazole                            40mg  / day, chronic diarrhea Medicines:                Monitored Anesthesia Care Procedure:                Pre-Anesthesia Assessment:                           - Prior to the procedure, a History and Physical                            was performed, and patient medications and                            allergies were reviewed. The patient's tolerance of                            previous anesthesia was also reviewed. The risks                            and benefits of the procedure and the sedation                            options and risks were discussed with the patient.                            All questions were answered, and informed consent                            was obtained. Prior Anticoagulants: The patient has                            taken no previous anticoagulant or antiplatelet                            agents. ASA Grade Assessment: III - A patient with  severe systemic disease. After reviewing the risks                            and benefits, the patient was deemed in                            satisfactory condition to undergo the procedure.                           After obtaining informed consent, the endoscope was                            passed under direct vision. Throughout the                            procedure, the  patient's blood pressure, pulse, and                            oxygen saturations were monitored continuously. The                            Olympus PCF-[id number] (#[id number]) Colonoscope was                            introduced through the mouth, and advanced to the                            second part of duodenum. The upper GI endoscopy was                            accomplished without difficulty. The patient                            tolerated the procedure well. Scope In: Scope Out: Findings:                 Esophagogastric landmarks were identified: the                            Z-line was found at 37 cm, the gastroesophageal                            junction was found at 37 cm and the upper extent of                            the gastric folds was found at 40 cm from the                            incisors.                           A 3 cm hiatal hernia was present.                           The exam of  the esophagus was otherwise normal. No                            Barrett's. No focal stenosis / stricture.                           A few small sessile polyps were found in the                            gastric body. Biopsies were taken with a cold                            forceps for histology as a representative sample to                            rule out adenomatous change.                           Diffuse mildly erythematous mucosa was found in the                            gastric antrum.                           The exam of the stomach was otherwise normal.                           Biopsies were taken with a cold forceps in the                            gastric body, at the incisura and in the gastric                            antrum for Helicobacter pylori testing.                           Patchy mildly erythematous mucosa was found in the                            second portion of the duodenum.                           The exam of the duodenum was  otherwise normal.                           Biopsies for histology were taken with a cold                            forceps in the duodenal bulb and in the second                            portion of the duodenum for evaluation of celiac  disease. Complications:            No immediate complications. Estimated blood loss:                            Minimal. Estimated Blood Loss:     Estimated blood loss was minimal. Impression:               - Esophagogastric landmarks identified.                           - 3 cm hiatal hernia.                           - Normal esophagus otherwise - no focal stenosis /                            stricture noted.                           - A few gastric polyps, suspect benign fundic gland                            polyps. Biopsied.                           - Erythematous mucosa in the antrum.                           - Normal stomach otherwise - biopsies taken to rule                            out H pylori                           - Mildly patchy erythematous duodenopathy.                           - Normal duodenum otherwise. Biopsies were taken                            with a cold forceps for evaluation of celiac                            disease. Recommendation:           - Patient has a contact number available for                            emergencies. The signs and symptoms of potential                            delayed complications were discussed with the                            patient. Return to normal activities tomorrow.  Written discharge instructions were provided to the                            patient.                           - Resume previous diet.                           - Continue present medications.                           - Await pathology results with further                            recommendations. Trend Hgb, no overt significant                             pathology to cause IDA / B12 deficiency Serita Degroote P. Havery Moros, MD 10/12/2021 2:21:45 PM This report has been signed electronically.

## 2021-10-13 ENCOUNTER — Telehealth: Payer: Self-pay

## 2021-10-13 NOTE — Chronic Care Management (AMB) (Signed)
Chronic Care Management Pharmacy Assistant   Name: Desiree Wilson  MRN: [id number] DOB: 1966-07-15  Desiree Wilson is an 55 y.o. year old female who presents for his initial CCM visit with the clinical pharmacist.   Recent office visits:  08/11/21 Luetta Nutting DO - Seen for diarrhea - Labs ordered - No medication changes noted - No follow up noted  07/28/21 Iran Planas PA - Seen for lower abdominal pain - Labs ordered - No medication changes noted - No follow up noted  07/17/21 Hali Marry MD - Seen for Need for Zostavax administration - discontinue Abilify and switch to Lybalvi 5-10 MG TABS 1 tablet daily - Follow up in 7 weeks  05/30/21 Hali Marry MD - Seen for Acute non-recurrent sinusitis - Start amoxicillin-clavulanate (AUGMENTIN) 875-125 MG tablet, Take 1 tablet by mouth 2 (two) times daily - No follow up noted     Recent consult visits:  10/12/21 Yetta Flock MD - Gastroenterology - Seen for procedure for chronic diarrhea - No medication changes noted - No follow up noted  09/27/21 Brendolyn Patty - Gastroenterology - Seen for Iron deficiency anemia - Labs ordered - Started Florastor Probiotic twice a day - Order for colonoscopy - No follow up noted  09/20/21 Abby Potash PA - [place] visit, seen for diarrhea - No medication changes noted - Follow up in 3 weeks  09/05/21  [place] for Facial pain - No medication changes noted - Follow up in 12 weeks  08/30/21 [place] visit, seen for diarrhea - No medication changes noted - Follow up in 4 weeks  08/09/21 [place] visit, seen for vitamin D deficiency - continue vitamin D 1.25 MG (50000 UNIT) CAPS capsule; Take 1 capsule (50,000 Units total) by mouth every 7 (seven) days. - Follow up in 3 weeks  07/19/21 [place] for prediabetes - No medication changes noted - Follow up in 3 weeks  06/29/21 [place] for procedure visit - Ketamine infusion given in clinic - No follow up noted  06/19/21 Esaw Grandchild NP - [place] for trigeminal neuralgia - No medication changes noted - Follow up in 2 weeks  06/05/21  [place] for facial pain -  No medication changes noted - No follow up noted  05/25/21 [place] for Low serum ferritin level - Start OTC B12 1000 mcg daily - Start OTC ferritin > 50 - Follow up in 2 weeks  05/11/21 [place] for fatigue - Labs ordered - No medication changes noted _ follow up in 2 weeks  04/18/21 Sonnie Alamo PA - Urgent care - Seen for upper respiratory infection - Start benzonatate (Tessalon Perles) 100 MG capsule, Take 1 capsule (100 mg total) by mouth 3 (three) times a day - No follow up noted   Hospital visits:  None in previous 6 months  Medications: Outpatient Encounter Medications as of 10/13/2021  Medication Sig   ARIPiprazole (ABILIFY) 5 MG tablet TAKE 1 TABLET(5 MG) BY MOUTH DAILY (  Patient not taking: Reported on 10/12/2021)   Carboxymethylcellulose Sodium 1 % GEL Apply to eye as needed.   clonazePAM (KLONOPIN) 1 MG tablet Take 1 tablet (1 mg total) by mouth 2 (two) times daily.   diphenoxylate-atropine (LOMOTIL) 2.5-0.025 MG tablet Take 1 tablet by mouth 4 (four) times daily as needed for diarrhea or loose stools.   drospirenone-ethinyl estradiol (YAZ) 3-0.02 MG tablet TAKE 1 TABLET BY MOUTH DAILY   ferrous fumarate (HEMOCYTE - 106 MG FE) 325 (106 Fe) MG TABS tablet Take 1 tablet by mouth.   fluticasone (FLONASE) 50 MCG/ACT nasal spray Place 2 sprays into both nostrils daily.   frovatriptan (FROVA)  2.5 MG tablet Take 2.5 mg by mouth daily as needed.   gabapentin (NEURONTIN) 600 MG tablet Take 600 mg by mouth 3 (three) times daily.   IBUPROFEN PO Take by mouth as needed.   ketamine (KETALAR) 10 MG/ML injection Inject into the vein every 3 (three) months.    Multiple Vitamin (MULTIVITAMIN) capsule Take 1 capsule by mouth daily.   omeprazole (PRILOSEC) 40 MG capsule TAKE ONE CAPSULE BY MOUTH DAILY   Oxcarbazepine (TRILEPTAL) 300 MG tablet Take by mouth.   Probiotic Product (PROBIOTIC PO) Take by mouth.   promethazine (PHENERGAN) 25 MG tablet Take 1 tablet (25 mg total) by mouth every 8 (eight) hours as needed for nausea or vomiting.   Pseudoephedrine-Acetaminophen (SINUS PO) Take by mouth as needed.   rosuvastatin (CRESTOR) 20 MG tablet TAKE 1 TABLET BY MOUTH DAILY   sertraline (ZOLOFT) 100 MG tablet TAKE 1 AND 1/2 TABLETS BY MOUTH DAILY   tiZANidine (ZANAFLEX) 4 MG tablet Take 4 mg by mouth every 6 (six) hours as needed.   traZODone (DESYREL) 100 MG tablet TAKE 2 TABLETS(200 MG) BY MOUTH AT BEDTIME AS NEEDED FOR SLEEP   vitamin B-12 (CYANOCOBALAMIN) 1000 MCG tablet Take 1,000 mcg by mouth daily.   vitamin C (ASCORBIC ACID) 500 MG tablet Take 500 mg by mouth daily.   Vitamin D, Ergocalciferol, (DRISDOL) 1.25 MG (50000 UNIT) CAPS capsule Take 1 capsule (50,000 Units total) by mouth every 7 (seven) days.   No facility-administered encounter medications on file as of 10/13/2021.    Care Gaps: HIV Screening Never done  Hepatitis C Screening Never done  COVID-19 Vaccine Last completed: Oct 20, 2020 INFLUENZA VACCINE Last completed: Oct 26, 2020 Zoster Vaccines- Shingrix Last completed: Jul 17, 2021   ARIPiprazole (ABILIFY) 5 MG tablet - Last fill 09/29/21 90 DS Carboxymethylcellulose Sodium 1 % GEL- Last fill 05/11/21 clonazePAM (KLONOPIN) 1 MG tablet- Last fill 09/05/2021 30 DS  diphenoxylate-atropine (LOMOTIL) 2.5-0.025 MG tablet- Last fill 08/04/2019 7 DS  drospirenone-ethinyl  estradiol (YAZ) 3-0.02 MG tablet- Last fill 08/06/2021 84 DS  ferrous fumarate (HEMOCYTE - 106 MG FE) 325 (106 Fe) MG TABS tablet- Last fill 05/30/21 fluticasone (FLONASE) 50 MCG/ACT nasal spray- Last fill 12/15/2020 90 DS  frovatriptan (FROVA) 2.5 MG tablet- Last fill 09/18/2019 90 DS gabapentin (NEURONTIN) 600 MG tablet- Last fill 10/11/2021 5 DS IBUPROFEN PO- Last fill 05/11/21 ketamine (KETALAR) 10 MG/ML injection- Last fill 06/29/21 Multiple Vitamin (MULTIVITAMIN) capsule- Last fill 06/11/16 omeprazole (PRILOSEC) 40 MG capsule- Last fill 08/30/2021 90 DS Oxcarbazepine (TRILEPTAL) 300 MG tablet- Last fill 09/05/2021 90 DS  Probiotic Product (PROBIOTIC PO) - Last fill 08/30/21  promethazine (PHENERGAN) 25 MG tablet- Last fill 05/06/2019 7 DS  Pseudoephedrine-Acetaminophen (SINUS PO) - Last fill 05/11/21  rosuvastatin (CRESTOR) 20 MG tablet- Last fill 07/31/2021 90 DS sertraline (ZOLOFT) 100 MG tablet-  Last fill 05/16/2021 30 DS  tiZANidine (ZANAFLEX) 4 MG tablet- Last fill 08/23/2021 1 DS  traZODone (DESYREL) 100 MG tablet- Last fill 08/23/2021 90 DS  vitamin B-12 (CYANOCOBALAMIN) 1000 MCG tablet- Last fill 05/30/21  vitamin C (ASCORBIC ACID) 500 MG tablet- Last fill 05/11/21 Vitamin D, Ergocalciferol, (DRISDOL) 1.25 MG (50000 UNIT) CAPS capsule- Last fill 08/09/21 7 DS    Star Rating Drugs: rosuvastatin (CRESTOR) 20 MG tablet- Last fill 07/31/2021 90 DS    Andee Poles, CMA

## 2021-10-16 ENCOUNTER — Telehealth: Payer: Self-pay

## 2021-10-16 NOTE — Telephone Encounter (Signed)
Called [phone] and left a message we tried to reach pt for a follow up call. maw

## 2021-10-16 NOTE — Telephone Encounter (Signed)
  Follow up Call-  Call back number 10/12/2021  Post procedure Call Back phone  # [phone]  Permission to leave phone message Yes  Some recent data might be hidden     Patient questions:  Do you have a fever, pain , or abdominal swelling? No. Pain Score  0 *  Have you tolerated food without any problems? Yes.    Have you been able to return to your normal activities? Yes.    Do you have any questions about your discharge instructions: Diet   No. Medications  No. Follow up visit  No.  Do you have questions or concerns about your Care? No.  Actions: * If pain score is 4 or above: No action needed, pain <4.  Pt. Said she is having the same troubles as prior to the procedure.   Explained that the procedures were diagnostic in nature.  Pt. Is back to how she was prior to the procedures as far as diet and activities.  Awaiting pathology results for further progress with improving S/S pt. Had prior to the procedure.

## 2021-10-18 ENCOUNTER — Other Ambulatory Visit: Payer: Self-pay | Admitting: *Deleted

## 2021-10-18 DIAGNOSIS — G894 Chronic pain syndrome: Secondary | ICD-10-CM | POA: Diagnosis not present

## 2021-10-18 DIAGNOSIS — G500 Trigeminal neuralgia: Secondary | ICD-10-CM | POA: Diagnosis not present

## 2021-10-18 DIAGNOSIS — G501 Atypical facial pain: Secondary | ICD-10-CM | POA: Diagnosis not present

## 2021-10-18 MED ORDER — DROSPIRENONE-ETHINYL ESTRADIOL 3-0.02 MG PO TABS: 1.0000 | ORAL_TABLET | Freq: Every day | ORAL | 3 refills | Status: DC

## 2021-10-19 ENCOUNTER — Telehealth: Payer: Self-pay | Admitting: Pharmacist

## 2021-10-19 ENCOUNTER — Telehealth: Payer: Self-pay | Admitting: Family Medicine

## 2021-10-19 ENCOUNTER — Telehealth: Payer: Medicare Other

## 2021-10-19 NOTE — Telephone Encounter (Signed)
Unsuccessful attempt x2 to contact patient for initial phone call for CCM services with pharmacist.  Will route to schedule team for reschedule.

## 2021-10-19 NOTE — Telephone Encounter (Signed)
Patient would like to reschedule missed appointment 

## 2021-10-20 ENCOUNTER — Telehealth: Payer: Self-pay | Admitting: *Deleted

## 2021-10-20 NOTE — Chronic Care Management (AMB) (Signed)
  Care Management   Note  10/20/2021 Name: Desiree Wilson MRN: [id number] DOB: 16-Aug-1966  Desiree Wilson is a 55 y.o. year old female who is a primary care patient of Hali Marry, MD and is actively engaged with the care management team. I reached out to [name] by phone today to assist with re-scheduling an initial visit with the Pharmacist  Follow up plan: Unsuccessful telephone outreach attempt made. A HIPAA compliant phone message was left for the patient providing contact information and requesting a return call.   Julian Hy, [place] Management  Direct Dial: [phone]

## 2021-10-20 NOTE — Telephone Encounter (Signed)
Appointment has been cancelled to avoid no show fee. AM

## 2021-10-23 NOTE — Chronic Care Management (AMB) (Signed)
  Care Management   Note  10/23/2021 Name: Desiree Wilson MRN: [id number] DOB: March 24, 1966  Desiree Wilson is a 55 y.o. year old female who is a primary care patient of Hali Marry, MD and is actively engaged with the care management team. I reached out to Desiree Wilson by phone today to assist with re-scheduling an initial visit with the Pharmacist  Follow up plan: 2nd Unsuccessful telephone outreach attempt made. A HIPAA compliant phone message was left for the patient providing contact information and requesting a return call.   Julian Hy, [place] Management  Direct Dial: [phone]

## 2021-11-01 ENCOUNTER — Encounter (INDEPENDENT_AMBULATORY_CARE_PROVIDER_SITE_OTHER): Payer: Self-pay | Admitting: Family Medicine

## 2021-11-01 DIAGNOSIS — F325 Major depressive disorder, single episode, in full remission: Secondary | ICD-10-CM

## 2021-11-01 NOTE — Telephone Encounter (Signed)
Last seen Tracey 

## 2021-11-01 NOTE — Telephone Encounter (Signed)
Hey there! Did you talk with her about this? I did not, and so am wondering if you have someone in mind for her. Thank you!

## 2021-11-06 ENCOUNTER — Encounter (INDEPENDENT_AMBULATORY_CARE_PROVIDER_SITE_OTHER): Payer: Self-pay | Admitting: Family Medicine

## 2021-11-06 ENCOUNTER — Other Ambulatory Visit: Payer: Self-pay

## 2021-11-06 ENCOUNTER — Telehealth (INDEPENDENT_AMBULATORY_CARE_PROVIDER_SITE_OTHER): Payer: Medicare Other | Admitting: Family Medicine

## 2021-11-06 DIAGNOSIS — G894 Chronic pain syndrome: Secondary | ICD-10-CM | POA: Diagnosis not present

## 2021-11-06 DIAGNOSIS — R197 Diarrhea, unspecified: Secondary | ICD-10-CM

## 2021-11-06 DIAGNOSIS — E559 Vitamin D deficiency, unspecified: Secondary | ICD-10-CM | POA: Diagnosis not present

## 2021-11-06 DIAGNOSIS — Z6835 Body mass index (BMI) 35.0-35.9, adult: Secondary | ICD-10-CM

## 2021-11-06 DIAGNOSIS — F331 Major depressive disorder, recurrent, moderate: Secondary | ICD-10-CM

## 2021-11-06 MED ORDER — VITAMIN D (ERGOCALCIFEROL) 1.25 MG (50000 UNIT) PO CAPS: 50000.0000 [IU] | ORAL_CAPSULE | ORAL | 0 refills | Status: DC

## 2021-11-06 NOTE — Progress Notes (Signed)
TeleHealth Visit:  Due to the COVID-19 pandemic, this visit was completed with telemedicine (audio/video) technology to reduce patient and provider exposure as well as to preserve personal protective equipment.   Desiree Wilson has verbally consented to this TeleHealth visit. The patient is located at home, the provider is located at the [facility] office. The participants in this visit include the listed provider and patient. The visit was conducted today via MyChart video.  Chief Complaint: OBESITY Desiree Wilson is here to discuss her progress with her obesity treatment plan along with follow-up of her obesity related diagnoses. Desiree Wilson is on FODMAP and states she is following her eating plan approximately 50% of the time. Desiree Wilson states she is not exercising at this time.  Today's visit was #: 8 Starting weight: 216 lbs Starting date: 05/11/2021  Interim History: [name] says she has increased pain, even with infusions.  Pain is rated 8/9.  She is having abdominal pain status post EGD, colonoscopy.  Endorses diarrhea daliy - taking Imodium.  She is drinking protein drinks.  She is not taking Abilify at this time.  She has increased anxiety due to pain and diarrhea.  She is out of ketamine nasal spray.  Assessment/Plan:   1. Vitamin D deficiency Not at goal.  She is taking vitamin D 50,000 IU weekly.  Plan: Continue to take prescription Vitamin D @50 ,000 IU every week as prescribed.  Follow-up for routine testing of Vitamin D, at least 2-3 times per year to avoid over-replacement.  Lab Results  Component Value Date   VD25OH 18.9 (L) 05/11/2021   VD25OH 13 (L) 05/14/2016   VD25OH 27 (L) 08/25/2014   - Refill Vitamin D, Ergocalciferol, (DRISDOL) 1.25 MG (50000 UNIT) CAPS capsule; Take 1 capsule (50,000 Units total) by mouth every 7 (seven) days.  Dispense: 12 capsule; Refill: 0  2. Diarrhea, chronic [place] continues to have diarrhea daily despite FODMAP diet and taking Imodium. The  patient understands monitoring parameters and red flags.   3. Chronic pain syndrome, controlled currently Desiree Wilson endorses increasing pain, even with her ketamine infusions.  She is out of her ketamine nasal spray.  We will continue to monitor symptoms as they relate to her weight loss journey.  4. Moderate episode of recurrent major depressive disorder ([place]) Will place referral to [place], MSW, LCSW. We will continue to monitor symptoms as they relate to her weight loss journey.  5. Class 2 severe obesity with serious comorbidity and body mass index (BMI) of 35.0 to 35.9 in adult, unspecified obesity type [facility])  Desiree Wilson is currently in the action stage of change. As such, her goal is to continue with weight loss efforts. She has agreed to FODMAP.   Exercise goals: No exercise has been prescribed at this time.  Behavioral modification strategies: increasing lean protein intake and increasing water intake.  Desiree Wilson has agreed to follow-up with our clinic in 4 weeks. She was informed of the importance of frequent follow-up visits to maximize her success with intensive lifestyle modifications for her multiple health conditions.  Objective:   VITALS: Per patient if applicable, see vitals. GENERAL: Alert and in no acute distress. CARDIOPULMONARY: No increased WOB. Speaking in clear sentences.  PSYCH: Pleasant and cooperative. Speech normal rate and rhythm. Affect is appropriate. Insight and judgement are appropriate. Attention is focused, linear, and appropriate.  NEURO: Oriented as arrived to appointment on time with no prompting.   Lab Results  Component Value Date   CREATININE  1.09 09/27/2021   BUN 15 09/27/2021   NA 137 09/27/2021   K 4.3 09/27/2021   CL 102 09/27/2021   CO2 25 09/27/2021   Lab Results  Component Value Date   ALT 9 09/27/2021   AST 12 09/27/2021   ALKPHOS 86 09/27/2021   BILITOT 0.3 09/27/2021   Lab Results  Component Value Date    HGBA1C 5.8 (H) 05/11/2021   HGBA1C 5.6 07/15/2017   HGBA1C 5.5 03/27/2013   Lab Results  Component Value Date   INSULIN 17.6 05/11/2021   Lab Results  Component Value Date   TSH 0.933 05/11/2021   Lab Results  Component Value Date   CHOL 203 (H) 03/02/2021   HDL 76 03/02/2021   LDLCALC 93 03/02/2021   TRIG 245 (H) 03/02/2021   CHOLHDL 2.7 03/02/2021   Lab Results  Component Value Date   VD25OH 18.9 (L) 05/11/2021   VD25OH 13 (L) 05/14/2016   VD25OH 27 (L) 08/25/2014   Lab Results  Component Value Date   WBC 11.4 (H) 09/27/2021   HGB 13.6 09/27/2021   HCT 41.5 09/27/2021   MCV 80.4 09/27/2021   PLT 191.0 09/27/2021   Lab Results  Component Value Date   IRON 113 05/11/2021   TIBC 398 05/11/2021   FERRITIN 24 05/11/2021   Attestation Statements:   Reviewed by clinician on day of visit: allergies, medications, problem list, medical history, surgical history, family history, social history, and previous encounter notes.  I, Water quality scientist, CMA, am acting as transcriptionist for Briscoe Deutscher, DO  I have reviewed the above documentation for accuracy and completeness, and I agree with the above. - Briscoe Deutscher, DO, MS, FAAFP, DABOM - Family and Bariatric Medicine.

## 2021-11-06 NOTE — Telephone Encounter (Signed)
Spoke with patient. Referred to [facility].

## 2021-11-09 ENCOUNTER — Ambulatory Visit: Payer: Medicare Other | Admitting: Gastroenterology

## 2021-11-09 ENCOUNTER — Encounter: Payer: Self-pay | Admitting: Gastroenterology

## 2021-11-09 VITALS — BP 120/88 | HR 89 | Ht 65.0 in | Wt 214.0 lb

## 2021-11-09 DIAGNOSIS — K219 Gastro-esophageal reflux disease without esophagitis: Secondary | ICD-10-CM

## 2021-11-09 DIAGNOSIS — K529 Noninfective gastroenteritis and colitis, unspecified: Secondary | ICD-10-CM

## 2021-11-09 DIAGNOSIS — E538 Deficiency of other specified B group vitamins: Secondary | ICD-10-CM

## 2021-11-09 DIAGNOSIS — Z86010 Personal history of colonic polyps: Secondary | ICD-10-CM

## 2021-11-09 DIAGNOSIS — D509 Iron deficiency anemia, unspecified: Secondary | ICD-10-CM

## 2021-11-09 MED ORDER — COLESTIPOL HCL 1 G PO TABS: 1.0000 g | ORAL_TABLET | Freq: Two times a day (BID) | ORAL | 3 refills | Status: DC

## 2021-11-09 MED ORDER — LOPERAMIDE HCL 2 MG PO TABS: 2.0000 mg | ORAL_TABLET | ORAL | 0 refills | Status: DC | PRN

## 2021-11-09 NOTE — Patient Instructions (Addendum)
If you are age 55 or older, your body mass index should be between 23-30. Your Body mass index is 35.61 kg/m. If this is out of the aforementioned range listed, please consider follow up with your Primary Care Provider.  If you are age 31 or younger, your body mass index should be between 19-25. Your Body mass index is 35.61 kg/m. If this is out of the aformentioned range listed, please consider follow up with your Primary Care Provider.   ________________________________________________________  The [facility] providers would like to encourage you to use [facility] to communicate with providers for non-urgent requests or questions.  Due to long hold times on the telephone, sending your provider a message by [facility] may be a faster and more efficient way to get a response.  Please allow 48 business hours for a response.  Please remember that this is for non-urgent requests.  _______________________________________________________  We are giving you a Low-FODMAP diet handout today. FODMAPs are short-chain carbohydrates (sugars) that are highly fermentable, which means that they go through chemical changes in the GI system, and are poorly absorbed during digestion. When FODMAPs reach the colon (large intestine), bacteria ferment these sugars, turning them into gas and chemicals. This stretches the walls of the colon, causing abdominal bloating, distension, cramping, pain, and/or changes in bowel habits in many patients with IBS. FODMAPs are not unhealthy or harmful, but may exacerbate GI symptoms in those with sensitive GI tracts.  We have sent the following medications to your pharmacy for you to pick up at your convenience: Colestid 1 g: Take twice a day (please check with the pharmacy regarding how you may need take your other medications in conjunction with Colestid)  Continue omeprazole Continue Imodium as needed  Please call or send [place] a MyChart message with an update in 2 weeks.  Thank you for  entrusting me with your care and for choosing [facility], Dr. Leona Cellar

## 2021-11-09 NOTE — Progress Notes (Signed)
HPI :  55 year old female here for a follow-up visit for chronic diarrhea, GERD, B12 deficiency, iron deficiency.  Recall the patient has been dealing with ongoing loose stools with urgency for a few months now.  She feels at least for the past 4 months this has been worse than usual.  Prior to the past when she had some loose stools occasionally but not to the severity.  She is having 5-7 bowel movements per day.  Loose urgent stools.  No blood in her stools.  No nocturnal symptoms.  She has having some associated abdominal cramping.  She states eating any salads or fruit can make things much worse for her.  She has a lot of bloating intestinal gas with the symptoms.  She had problems with trigeminal neuralgia on chronic pain regimen.  She states the diarrhea makes her symptoms even worse.  She does not take any NSAIDs.  She has been evaluated by bariatric surgery months ago was told she had deficiencies in iron, B12, and vitamin D.  She is been depressed lately and the symptoms are making this worse as well.  She has a history of C. difficile years ago but that was treated.  She states she has an allergy to azithromycin however the reported allergy is C. difficile, she tolerates the medicine okay in general.  She has been using Imodium as needed, which can help reduce stool frequency slightly.  She otherwise has been taking omeprazole 40 mg once daily for GERD.  She states if she does not take it she feels much worse and feels that the regimen is helping her reflux.  See work-up as below.  Labs normal.  She tested negative for infection and celiac disease.  She had an EGD and colonoscopy with me a few weeks ago which did not show any clear cause for her vitamin deficiencies or her loose stools.   Colonoscopy 12/07/2016: - Non-thrombosed external hemorrhoids found on perianal exam. - One 3 mm polyp at the ileocecal valve, removed with a cold biopsy forceps. Resected and retrieved. - One 6 mm polyp  at the hepatic flexure, removed with a cold snare. Resected and retrieved. - One 5 mm polyp in the descending colon, removed with a cold snare. Resected and retrieved. - Internal hemorrhoids. - 5 year colonoscopy recall - The examination was otherwise normal. No inflammatory changes - Retroflexion not performed due to small size of rectum. - Biopsies were taken with a cold forceps from the right colon  1. Surgical [P], ileocecal valve, hepatic flexure, descending, polyp (3) -TUBULAR ADENOMAS AND BENIGN COLONIC MUCOSA. -NO HIGH GRADE DYSPLASIA OR MALIGNANCY IDENTIFIED. 2. Surgical [P], random right colon -BENIGN COLONIC MUCOSA WITH NO HISTOPATHOLOGIC ABNORMALITY. -NO EVIDENCE OF LYMPHOCYTIC OR COLLAGENOUS COLITIS, INFLAMMATORY BOWEL DISEASE OR MALIGNANCY   CT abdomen / pelvis with contrast 07/28/21 - IMPRESSION: No acute findings or other significant abnormality.   GI pathogen panel negative Hgb 13.6, WBC 11.4 CRP 4.0 TTG and IgA normal  EGD 10/12/21 -  - A 3 cm hiatal hernia was present. - The exam of the esophagus was otherwise normal. No Barrett's. No focal stenosis / stricture. - A few small sessile polyps were found in the gastric body. Biopsies were taken with a cold forceps for histology as a representative sample to rule out adenomatous change. - Diffuse mildly erythematous mucosa was found in the gastric antrum. - The exam of the stomach was otherwise normal. - Biopsies were taken with a cold forceps in the  gastric body, at the incisura and in the gastric antrum for Helicobacter pylori testing. - Patchy mildly erythematous mucosa was found in the second portion of the duodenum. - The exam of the duodenum was otherwise normal. - Biopsies for histology were taken with a cold forceps in the duodenal bulb and in the second portion of the duodenum for evaluation of celiac disease.  Colonoscopy 10/12/21 - The perianal and digital rectal examinations were normal. - The  terminal ileum appeared normal. - Two sessile polyps were found in the ascending colon. The polyps were 3 to 4 mm in size. These polyps were removed with a cold snare. Resection and retrieval were complete. - A single medium-mouthed diverticulum was found in the ascending colon. - Internal hemorrhoids were found during retroflexion. The hemorrhoids were small. - The exam was otherwise without abnormality. Several minutes spent lavaging the colon to achieve adequate views. - Biopsies for histology were taken with a cold forceps from the right colon, left colon and transverse colon for evaluation of microscopic colitis.  Diagnosis 1. Surgical [P], small bowel - DUODENAL MUCOSA WITH FOCAL EROSION AND MILD ASSOCIATED INFLAMMATION. - NO FEATURES OF CELIAC SPRUE OR GRANULOMAS. 2. Surgical [P], gastric antrum and gastric body - ANTRAL AND OXYNTIC MUCOSA WITH MILD CHANGES OF REACTIVE GASTROPATHY. [name] NEGATIVE FOR HELICOBACTER PYLORI. - NO INTESTINAL METAPLASIA, DYSPLASIA OR CARCINOMA. 3. Surgical [P], gastric polyps - FUNDIC GLAND POLYP. - NO INTESTINAL METAPLASIA, ADENOMATOUS CHANGE OR CARCINOMA. 4. Surgical [P], colon, ascending, polyp (2) - TUBULAR ADENOMA(S). - NO HIGH GRADE DYSPLASIA OR CARCINOMA. - INFLAMMATORY POLYP. 5. Surgical [P], random sites, left colon - UNREMARKABLE COLONIC MUCOSA. - NO MICROSCOPIC COLITIS, ACTIVE INFLAMMATION OR CHRONIC CHANGES. Desiree Wilson    Past Medical History:  Diagnosis Date   Allergy    rhinitis   Anemia    Anxiety    Blood transfusion    x2 1983 surgery blood loss   Blood transfusion without reported diagnosis    Depression    Depression    Facial paralysis    right face/ chronic pain syndrome (pain management Dr Sydell Axon)   [id number](981.1)    Hyperlipidemia    Hypotension    Iron deficiency anemia 11/15/2014   Iron malabsorption 11/15/2014   Osteoarthritis    Trigeminal neuralgia    right   Trigeminal neuralgia 2002      Past Surgical History:  Procedure Laterality Date   COLONOSCOPY     ESOPHAGOGASTRODUODENOSCOPY     gamma knife Right 01/2018   MANDIBLE SURGERY     NASAL SINUS SURGERY     OSTEOTOMY     plastic facial surgery     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   Family History  Problem Relation Age of Onset   Breast cancer Mother        lumpectomy and radiation   Obesity Mother    Stroke Father    Diverticulitis Father    Hypertension Father    Hyperlipidemia Father    Depression Father    Alzheimer's disease Father    Colon cancer Neg Hx    Social History   Tobacco Use   Smoking status: Some Days    Years: 6.00    Types: Cigarettes    Last attempt to quit: 12/31/2000    Years since quitting: 20.8   Smokeless tobacco: Never   Tobacco comments:    tobacco infor given  Vaping Use   Vaping Use: Former   [employer]  date: 06/30/2018   Substances: Nicotine  Substance Use Topics   Alcohol use: No    Alcohol/week: 0.0 standard drinks   Drug use: No   Current Outpatient Medications  Medication Sig Dispense Refill   AMBULATORY NON FORMULARY MEDICATION Ketamine nose spray: use 2 sprays prn pain, up to 10 sprays a day     Carboxymethylcellulose Sodium 1 % GEL Apply to eye as needed.     clonazePAM (KLONOPIN) 1 MG tablet Take 1 tablet (1 mg total) by mouth 2 (two) times daily. 30 tablet 2   diphenoxylate-atropine (LOMOTIL) 2.5-0.025 MG tablet Take 1 tablet by mouth 4 (four) times daily as needed for diarrhea or loose stools. 30 tablet 0   drospirenone-ethinyl estradiol (YAZ) 3-0.02 MG tablet Take 1 tablet by mouth daily. 84 tablet 3   ferrous fumarate (HEMOCYTE - 106 MG FE) 325 (106 Fe) MG TABS tablet Take 1 tablet by mouth.     fluticasone (FLONASE) 50 MCG/ACT nasal spray Place 2 sprays into both nostrils daily. 16 g 6   frovatriptan (FROVA) 2.5 MG tablet Take 2.5 mg by mouth daily as needed.     gabapentin (NEURONTIN) 600 MG tablet Take 600 mg by mouth  3 (three) times daily.     IBUPROFEN PO Take by mouth as needed.     ketamine (KETALAR) 10 MG/ML injection Inject into the vein every 3 (three) months.      Multiple Vitamin (MULTIVITAMIN) capsule Take 1 capsule by mouth daily.     omeprazole (PRILOSEC) 40 MG capsule TAKE ONE CAPSULE BY MOUTH DAILY 90 capsule 3   Oxcarbazepine (TRILEPTAL) 300 MG tablet Take by mouth.     Probiotic Product (PROBIOTIC PO) Take by mouth.     promethazine (PHENERGAN) 25 MG tablet Take 1 tablet (25 mg total) by mouth every 8 (eight) hours as needed for nausea or vomiting. 20 tablet 0   Pseudoephedrine-Acetaminophen (SINUS PO) Take by mouth as needed.     rosuvastatin (CRESTOR) 20 MG tablet TAKE 1 TABLET BY MOUTH DAILY 90 tablet 3   sertraline (ZOLOFT) 100 MG tablet TAKE 1 AND 1/2 TABLETS BY MOUTH DAILY 145 tablet 1   tiZANidine (ZANAFLEX) 4 MG tablet Take 4 mg by mouth every 6 (six) hours as needed.     traZODone (DESYREL) 100 MG tablet TAKE 2 TABLETS(200 MG) BY MOUTH AT BEDTIME AS NEEDED FOR SLEEP 180 tablet 1   vitamin B-12 (CYANOCOBALAMIN) 1000 MCG tablet Take 1,000 mcg by mouth daily.     vitamin C (ASCORBIC ACID) 500 MG tablet Take 500 mg by mouth daily.     Vitamin D, Ergocalciferol, (DRISDOL) 1.25 MG (50000 UNIT) CAPS capsule Take 1 capsule (50,000 Units total) by mouth every 7 (seven) days. 12 capsule 0   No current facility-administered medications for this visit.   Allergies  Allergen Reactions   Azithromycin Rash    REACTION: C Diff Other reaction(s): Other (See Comments), Other (See Comments) REACTION: C Diff REACTION: C Diff REACTION: C Diff    Methadone     Other reaction(s): Hallucinations, Other (See Comments), Other (See Comments) Hallucinations Hallucinations Hallucinations    Tapentadol Anxiety    Other reaction(s): Dizziness   Phenobarbital     REACTION: Excited     Review of Systems: All systems reviewed and negative except where noted in HPI.   Lab Results  Component  Value Date   WBC 11.4 (H) 09/27/2021   HGB 13.6 09/27/2021   HCT 41.5 09/27/2021   MCV 80.4  09/27/2021   PLT 191.0 09/27/2021    Lab Results  Component Value Date   CREATININE 1.09 09/27/2021   BUN 15 09/27/2021   NA 137 09/27/2021   K 4.3 09/27/2021   CL 102 09/27/2021   CO2 25 09/27/2021    Lab Results  Component Value Date   ALT 9 09/27/2021   AST 12 09/27/2021   ALKPHOS 86 09/27/2021   BILITOT 0.3 09/27/2021     Physical Exam: BP 120/88   Pulse 89   Ht 5\' 5"  (1.651 m)   Wt 214 lb (97.1 kg)   BMI 35.61 kg/m  Constitutional: Pleasant,well-developed, female in no acute distress. Neurological: Alert and oriented to person place and time. Psychiatric: Normal mood and affect. Behavior is normal.   ASSESSMENT AND PLAN: 55 year old female here for reassessment of following:  Chronic diarrhea GERD B12 deficiency Iron deficiency History of colon polyps  Evaluation with EGD, colonoscopy, CT scan and recent months has failed to show a clear cause for her symptoms or vitamin deficiencies.  She is on B12 and iron and her hemoglobin is normal.  We discussed options to treat this, perhaps she has a functional bowel disorder driving this.  Counseled her on a low FODMAP diet we will see if that helps as she does have some clear food triggers, she should avoid those triggers if possible.  We discussed options for therapy moving forward.  We will try her empirically on Colestid 1 g twice daily to see if she has any component of bile salt diarrhea.  She should also use Imodium in addition to this as needed.  If this regimen does not help her we may consider trial of rifaximin or Viberzi pending her course.  I asked her to contact me in a few weeks to see how she is doing.  She understands risks of long-term use of PPIs which we discussed, she wishes to continue omeprazole as that is helping her reflux.  Unclear if the omeprazole is causing some B12 deficiency.  Reassured her that her  imaging and procedure results looked okay.  She has responded to iron and her CBC is currently normal.,  I think we can hold off on capsule endoscopy at this time unless symptoms persist or iron deficiency persists.  Plan: - Low FODMAP diet both handouts - trial of Colestid 1gm BID  - Immodium PRN - Consider rifaximin or viberzi pending course - contact me in a few weeks for reassessment - Continue omeprazole, discussed risks of PPI - Continue B12 / iron supplementation  Jolly Mango, MD [facility]

## 2021-11-15 NOTE — Chronic Care Management (AMB) (Signed)
  Care Management   Note  11/15/2021 Name: [name] MRN: [id number] DOB: 09-26-1966  Desiree Wilson is a 55 y.o. year old female who is a primary care patient of Hali Marry, MD and is actively engaged with the care management team. I reached out to Desiree Wilson by phone today to assist with re-scheduling an initial visit with the Pharmacist  Follow up plan: Telephone appointment with care management team member scheduled for: 11/21/2021  Julian Hy, [place], [place] [facility]  Direct Dial: [phone]

## 2021-11-20 ENCOUNTER — Encounter: Payer: Self-pay | Admitting: Family Medicine

## 2021-11-20 ENCOUNTER — Telehealth (INDEPENDENT_AMBULATORY_CARE_PROVIDER_SITE_OTHER): Payer: Medicare Other | Admitting: Family Medicine

## 2021-11-20 DIAGNOSIS — K529 Noninfective gastroenteritis and colitis, unspecified: Secondary | ICD-10-CM | POA: Diagnosis not present

## 2021-11-20 DIAGNOSIS — F331 Major depressive disorder, recurrent, moderate: Secondary | ICD-10-CM

## 2021-11-20 MED ORDER — DULOXETINE HCL 30 MG PO CPEP: 30.0000 mg | ORAL_CAPSULE | Freq: Every day | ORAL | 1 refills | Status: DC

## 2021-11-20 NOTE — Assessment & Plan Note (Signed)
Discussed options.  Will start cymbalta after Thanksgiving.  Will start seeing therapist in Feb and I think that will help. If diarrhea is better that should help.

## 2021-11-20 NOTE — Progress Notes (Signed)
    Virtual Visit via Video Note  I connected with Desiree Wilson on 11/20/21 at  1:00 PM EST by a video enabled telemedicine application and verified that I am speaking with the correct person using two identifiers.   I discussed the limitations of evaluation and management by telemedicine and the availability of in person appointments. The patient expressed understanding and agreed to proceed.  Patient location: at home Provider location: in office  Subjective:    CC:  No chief complaint on file.   HPI:  F/U depression - she came off her zoloft for about 2 weeks and says her chronic diarrhea improved.  Her last episode of diarrhea was 2 days ago and was loose.  She does feels like her diarrhea is better.  He does have a follow-up with GI coming up soon.  Had 2 of her worse pain days ever but was out of her ketamine nose sprays. She is starting therapy for chronic pain and her mood. Schedule in mid-February, Leane Call.  She has never had therapy for chronic pain.  She says when she gets sensitive severe pain episodes like that she does feel like it worsens her depression and it just really gets her down.    Past medical history, Surgical history, Family history not pertinant except as noted below, Social history, Allergies, and medications have been entered into the medical record, reviewed, and corrections made.    Objective:    General: Speaking clearly in complete sentences without any shortness of breath.  Alert and oriented x3.  Normal judgment. No apparent acute distress.    Impression and Recommendations:    Problem List Items Addressed This Visit       Digestive   Chronic diarrhea    Somewhat better after coming off of the Zoloft.  She has been on it for probably about 20 years.  Hopefully will continue to improve.  I did encourage her to stay off of the medication for an additional week before starting the new Cymbalta.  Just to make sure that she really is  doing well off of the medication.        Other   Depression - Primary    Discussed options.  Will start cymbalta after Thanksgiving.  Will start seeing therapist in Feb and I think that will help. If diarrhea is better that should help.        Relevant Medications   DULoxetine (CYMBALTA) 30 MG capsule    No orders of the defined types were placed in this encounter.   Meds ordered this encounter  Medications   DULoxetine (CYMBALTA) 30 MG capsule    Sig: Take 1 capsule (30 mg total) by mouth daily.    Dispense:  30 capsule    Refill:  1     I discussed the assessment and treatment plan with the patient. The patient was provided an opportunity to ask questions and all were answered. The patient agreed with the plan and demonstrated an understanding of the instructions.   The patient was advised to call back or seek an in-person evaluation if the symptoms worsen or if the condition fails to improve as anticipated.  I spent 21 minutes on the day of the encounter to include pre-visit record review, face-to-face time with the patient and post visit ordering of test.   [name], MD

## 2021-11-20 NOTE — Progress Notes (Signed)
She stated that she hasn't been on the Sertraline for 2.5 wks. Her stools have been lose and she has been taking Imodium for this which helps, her last episode of diarrhea was today. She also reports being off of the Abilify for the past month.

## 2021-11-20 NOTE — Assessment & Plan Note (Signed)
Somewhat better after coming off of the Zoloft.  She has been on it for probably about 20 years.  Hopefully will continue to improve.  I did encourage her to stay off of the medication for an additional week before starting the new Cymbalta.  Just to make sure that she really is doing well off of the medication.

## 2021-11-20 NOTE — Telephone Encounter (Signed)
Pt last seen by Dr. Wallace.  

## 2021-11-21 ENCOUNTER — Ambulatory Visit (INDEPENDENT_AMBULATORY_CARE_PROVIDER_SITE_OTHER): Payer: Medicare Other | Admitting: Pharmacist

## 2021-11-21 ENCOUNTER — Other Ambulatory Visit: Payer: Self-pay

## 2021-11-21 ENCOUNTER — Encounter: Payer: Self-pay | Admitting: Gastroenterology

## 2021-11-21 DIAGNOSIS — G894 Chronic pain syndrome: Secondary | ICD-10-CM

## 2021-11-21 DIAGNOSIS — E7849 Other hyperlipidemia: Secondary | ICD-10-CM

## 2021-11-21 DIAGNOSIS — F331 Major depressive disorder, recurrent, moderate: Secondary | ICD-10-CM

## 2021-11-21 NOTE — Progress Notes (Signed)
Chronic Care Management Pharmacy Note  11/21/2021 Name:  Desiree Wilson MRN:  [id number] DOB:  March 14, 1966  Summary: addressed HLD mental health, chronic pain  Recommendations/Changes made from today's visit: none, patient already has plan in place for starting new duloxetine, new colestid for diarrhea, and getting connected with pain therapy in February.  Plan: f/u with pharmacist in 1 month for ongoing support with these new medications/changes  Subjective: Desiree Wilson is an 55 y.o. year old female who is a primary patient of Metheney, Rene Kocher, MD.  The CCM team was consulted for assistance with disease management and care coordination needs.    Engaged with patient by telephone for initial visit in response to provider referral for pharmacy case management and/or care coordination services.   Consent to Services:  The patient was given information about Chronic Care Management services, agreed to services, and gave verbal consent prior to initiation of services.  Please see initial visit note for detailed documentation.   Patient Care Team: Hali Marry, MD as PCP - General (Family Medicine) Elmarie Shiley, MD as Consulting Physician (Nephrology) Lucia Bitter., MD as Consulting Physician (Pain Medicine) Darius Bump, [facility] as Pharmacist (Pharmacist)  Recent office visits:  08/11/21 Luetta Nutting DO - Seen for diarrhea - Labs ordered - No medication changes noted - No follow up noted  07/28/21 Iran Planas PA - Seen for lower abdominal pain - Labs ordered - No medication changes noted - No follow up noted  07/17/21 Hali Marry MD - Seen for Need for Zostavax administration - discontinue Abilify and switch to Lybalvi 5-10 MG TABS 1 tablet daily - Follow up in 7 weeks  05/30/21 Hali Marry MD - Seen for Acute non-recurrent sinusitis - Start amoxicillin-clavulanate (AUGMENTIN) 875-125 MG tablet, Take 1 tablet by mouth 2 (two) times daily - No follow  up noted        Recent consult visits:  10/12/21 Yetta Flock MD - Gastroenterology - Seen for procedure for chronic diarrhea - No medication changes noted - No follow up noted  09/27/21 Brendolyn Patty - Gastroenterology - Seen for Iron deficiency anemia - Labs ordered - Started Florastor Probiotic twice a day - Order for colonoscopy - No follow up noted  09/20/21 Abby Potash PA - [place] visit, seen for diarrhea - No medication changes noted - Follow up in 3 weeks  09/05/21  [place] for Facial pain - No medication changes noted - Follow up in 12 weeks  08/30/21 [place] visit, seen for diarrhea - No medication changes noted - Follow up in 4 weeks  08/09/21 [place] visit, seen for vitamin D deficiency - continue vitamin D 1.25 MG (50000 UNIT) CAPS capsule; Take 1 capsule (50,000 Units total) by mouth every 7 (seven) days. - Follow up in 3 weeks  07/19/21 [place] for prediabetes - No medication changes noted - Follow up in 3 weeks    Hospital visits:  None in previous 6 months  Objective:  Lab Results  Component Value Date   CREATININE 1.09 09/27/2021   CREATININE 1.07 (H) 07/28/2021   CREATININE 0.94 05/11/2021    Lab Results  Component Value Date   HGBA1C 5.8 (H) 05/11/2021   Last diabetic  Eye exam: No results found for: HMDIABEYEEXA  Last diabetic Foot exam: No results found for: HMDIABFOOTEX      Component Value Date/Time   CHOL 203 (H) 03/02/2021 0000   TRIG 245 (H) 03/02/2021 0000   HDL 76 03/02/2021 0000   CHOLHDL 2.7 03/02/2021 0000   VLDL 54 (H) 03/05/2017 0916   LDLCALC 93 03/02/2021 0000    Hepatic Function Latest Ref Rng & Units 09/27/2021 07/28/2021 05/11/2021  Total Protein 6.0 - 8.3 g/dL 7.7 7.3 6.8  Albumin 3.5 - 5.2  g/dL 4.0 - 4.4  AST 0 - 37 U/L '12 21 20  ' ALT 0 - 35 U/L '9 18 17  ' Alk Phosphatase 39 - 117 U/L 86 - 86  Total Bilirubin 0.2 - 1.2 mg/dL 0.3 0.3 0.2    Lab Results  Component Value Date/Time   TSH 0.933 05/11/2021 10:26 AM   TSH 1.21 01/19/2020 10:47 AM   FREET4 0.92 05/11/2021 10:26 AM    CBC Latest Ref Rng & Units 09/27/2021 07/28/2021 05/11/2021  WBC 4.0 - 10.5 K/uL 11.4(H) 12.2(H) 11.4(H)  Hemoglobin 12.0 - 15.0 g/dL 13.6 14.7 13.5  Hematocrit 36.0 - 46.0 % 41.5 46.1(H) 42.0  Platelets 150.0 - 400.0 K/uL 191.0 234 216    Lab Results  Component Value Date/Time   VD25OH 18.9 (L) 05/11/2021 10:26 AM   VD25OH 13 (L) 05/14/2016 10:30 AM    Social History   Tobacco Use  Smoking Status Some Days   Years: 6.00   Types: Cigarettes   Last attempt to quit: 12/31/2000   Years since quitting: 20.9  Smokeless Tobacco Never  Tobacco Comments   tobacco infor given   BP Readings from Last 3 Encounters:  11/09/21 120/88  10/12/21 (!) 118/56  09/27/21 120/70   Pulse Readings from Last 3 Encounters:  11/09/21 89  10/12/21 67  09/27/21 70   Wt Readings from Last 3 Encounters:  11/09/21 214 lb (97.1 kg)  10/12/21 212 lb (96.2 kg)  09/27/21 212 lb 2 oz (96.2 kg)    Assessment: Review of patient past medical history, allergies, medications, health status, including review of consultants reports, laboratory and other test data, was performed as part of comprehensive evaluation and provision of chronic care management services.   SDOH:  (Social Determinants of Health) assessments and interventions performed:    CCM Care Plan  Allergies  Allergen Reactions   Azithromycin Rash    REACTION: C Diff Other reaction(s): Other (See Comments), Other (See Comments) REACTION: C Diff REACTION: C Diff REACTION: C Diff    Methadone     Other reaction(s): Hallucinations, Other (See Comments), Other (See Comments) Hallucinations Hallucinations Hallucinations    Tapentadol Anxiety     Other reaction(s): Dizziness   Phenobarbital     REACTION: Excited   Wellbutrin [Bupropion] Other (See Comments)    Not effective.     Medications Reviewed Today     Reviewed by Darius Bump, [facility] (Pharmacist) on 11/21/21 at 1110  Med List Status: <None>   Medication Order Taking? Sig Documenting Provider Last Dose Status Informant  AMBULATORY NON FORMULARY MEDICATION [id number] Yes Ketamine nose spray: use 2 sprays prn pain, up to 10 sprays a day [provider] Taking Active   Carboxymethylcellulose Sodium 1 % GEL [id number] Yes Apply to eye as needed. [provider] Taking Active   clonazePAM (KLONOPIN) 1 MG tablet [id number] Yes Take 1 tablet (1 mg total) by mouth 2 (two) times daily. Bowen, Collene Leyden, DO Taking Active  colestipol (COLESTID) 1 g tablet [id number] Yes Take 1 tablet (1 g total) by mouth 2 (two) times daily. Yetta Flock, MD Taking Active   drospirenone-ethinyl estradiol (YAZ) 3-0.02 MG tablet [id number] Yes Take 1 tablet by mouth daily. Hali Marry, MD Taking Active   DULoxetine (CYMBALTA) 30 MG capsule [id number] No Take 1 capsule (30 mg total) by mouth daily.  Patient not taking: Reported on 11/21/2021   Hali Marry, MD Not Taking Active   ferrous fumarate (HEMOCYTE - 106 MG FE) 325 (106 Fe) MG TABS tablet [id number] Yes Take 1 tablet by mouth every other day. [provider] Taking Active   fluticasone (FLONASE) 50 MCG/ACT nasal spray [id number] Yes Place 2 sprays into both nostrils daily. Hali Marry, MD Taking Active   frovatriptan (FROVA) 2.5 MG tablet [id number] Yes Take 2.5 mg by mouth daily as needed. [provider] Taking Active   gabapentin (NEURONTIN) 600 MG tablet [id number] Yes Take 600 mg by mouth 3 (three) times daily. [provider] Taking Active   IBUPROFEN PO [id number] Yes Take by mouth as needed. [provider] Taking Active   ketamine (KETALAR) 10 MG/ML injection  [id number] Yes Inject into the vein every 3 (three) months.  [provider] Taking Active            Med Note Maryruth Eve, TONYA L   Wed Apr 27, 2020 10:58 AM)    loperamide (IMODIUM A-D) 2 MG tablet [id number] Yes Take 1 tablet (2 mg total) by mouth as needed for diarrhea or loose stools. Yetta Flock, MD Taking Active   Multiple Vitamin (MULTIVITAMIN) capsule [id number] Yes Take 1 capsule by mouth daily. [provider] Taking Active   omeprazole (PRILOSEC) 40 MG capsule [id number] Yes TAKE ONE CAPSULE BY MOUTH DAILY Hali Marry, MD Taking Active   Oxcarbazepine (TRILEPTAL) 300 MG tablet [id number] Yes Take 300 mg by mouth 2 (two) times daily. [provider] Taking Active   Probiotic Product (PROBIOTIC PO) [id number] Yes Take by mouth. [provider] Taking Active   promethazine (PHENERGAN) 25 MG tablet [id number] Yes Take 1 tablet (25 mg total) by mouth every 8 (eight) hours as needed for nausea or vomiting. Hali Marry, MD Taking Active   Pseudoephedrine-Acetaminophen (SINUS PO) [id number] Yes Take by mouth as needed. [provider] Taking Active   rosuvastatin (CRESTOR) 20 MG tablet [id number] Yes TAKE 1 TABLET BY MOUTH DAILY Hali Marry, MD Taking Active   tiZANidine (ZANAFLEX) 4 MG tablet [id number] Yes Take 4 mg by mouth every 6 (six) hours as needed. [provider] Taking Active            Med Note Teddy Spike   Wed Apr 27, 2020 10:58 AM)    traZODone (DESYREL) 100 MG tablet [id number] Yes TAKE 2 TABLETS(200 MG) BY MOUTH AT BEDTIME AS NEEDED FOR SLEEP Hali Marry, MD Taking Active   vitamin B-12 (CYANOCOBALAMIN) 1000 MCG tablet [id number] Yes Take 1,000 mcg by mouth daily. [provider] Taking Active   vitamin C (ASCORBIC ACID) 500 MG tablet [id number] Yes Take 500 mg by mouth daily. [provider] Taking Active   Vitamin D, Ergocalciferol, (DRISDOL) 1.25 MG (50000 UNIT)  CAPS capsule [id number] Yes Take 1 capsule (50,000 Units total) by mouth every 7 (seven) days. Briscoe Deutscher, DO Taking Active   Med List Note Madilyn Fireman, Rene Kocher, MD 01/14/17 1333):  Patient Active Problem List   Diagnosis Date Noted   Diarrhea 08/11/2021   Prediabetes 05/30/2021   Low serum ferritin level 05/30/2021   B12 deficiency 05/30/2021   Chronic pain syndrome 05/30/2021   Recurrent major depressive disorder, in partial remission ([place]) 05/30/2021   Class 2 severe obesity with serious comorbidity and body mass index (BMI) of 36.0 to 36.9 in adult [facility]) 05/30/2021   Primary insomnia 02/02/2021   Snoring 10/26/2020   BMI 38.0-38.9,adult 10/26/2020   Menorrhagia 06/10/2020   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   Chronic diarrhea 07/15/2017   Deafness in right ear 07/12/2016   Gastroesophageal reflux disease with esophagitis 06/11/2016   Vitamin D deficiency 06/06/2016   Myofascial pain 02/29/2016   Abnormal brain MRI 07/18/2015   Chronic insomnia 12/16/2014   Iron deficiency anemia 11/15/2014   Iron malabsorption 11/15/2014   CKD (chronic kidney disease) stage 3, GFR 30-59 ml/min (HCC) 08/26/2014   Chronic migraine without aura, intractable, without status migrainosus 01/25/2014   Trigeminal neuralgia 07/22/2012   ANKLE PAIN, LEFT 03/06/2011   FATIGUE 08/10/2010   HYPERSOMNIA 08/09/2009   Hyperlipidemia 07/05/2009   Depression 07/05/2009   ALLERGIC RHINITIS 07/05/2009   OSTEOARTHRITIS 07/05/2009   HEADACHE 07/05/2009    Immunization History  Administered Date(s) Administered   Influenza Split 11/25/2005, 12/25/2006, 10/20/2007, 09/26/2008   Influenza,inj,Quad PF,6+ Mos 08/17/2014, 08/26/2015, 08/20/2016, 01/15/2018, 10/27/2018, 01/19/2020, 10/26/2020   Influenza-Unspecified 09/30/2013   PFIZER(Purple Top)SARS-COV-2 Vaccination 03/05/2020, 04/05/2020, 10/20/2020   Td 08/10/2010   Tdap 10/26/2020   Zoster Recombinat (Shingrix) 07/17/2021     Conditions to be addressed/monitored: HLD, Anxiety, Depression, and chronic pain  Care Plan : Medication Management  Updates made by Darius Bump, RPH since 11/21/2021 12:00 AM     Problem: HLD, chronic pain, mental health      Long-Range Goal: Disease Progression Prevention   Start Date: 11/21/2021  This Visit's Progress: On track  Priority: High  Note:   Current Barriers:  None at present  Pharmacist Clinical Goal(s):  Over the next 30 days, patient will adhere to plan to optimize therapeutic regimen for chronic conditions as evidenced by report of adherence to recommended medication management changes through collaboration with PharmD and provider.   Interventions: 1:1 collaboration with Hali Marry, MD regarding development and update of comprehensive plan of care as evidenced by provider attestation and co-signature Inter-disciplinary care team collaboration (see longitudinal plan of care) Comprehensive medication review performed; medication list updated in electronic medical record  Hyperlipidemia:  Controlled; current treatment:rosuvastatin 36m daily; LDL 93, TG 245  Recommended continue current regimen Depression/Anxiety:  Controlled; current treatment:klonopin PRN, recently stopped sertraline & abilify, plan to start duloxetine;   [facility]  Recommended continue current regimen, and  Chronic Pain  Controlled; current treatment:oxcarbazepine 3056mBID, klonopin 36m6mID PRN, tizanidine 4mg14mery 6hr PRN; for breakthrough tries gabapentin first, then ketamine nasal spray  Recommended continue current regimen Patient Goals/Self-Care Activities Over the next 30 days, patient will:  take medications as prescribed  Follow Up Plan: Telephone follow up appointment with care management team member scheduled for:  1 month      Medication Assistance: None required.  Patient affirms  current coverage meets needs.  [id number] [place] [id number]: [phone]: [phone] -  [place], [place] [address] Phone: [phone] Fax: [phone]  Uses pill box? No - has a makeup bag & keeps things together in this way Pt endorses 100% compliance  Follow Up:  Patient agrees to Care Plan and Follow-up.  Plan: Telephone follow up appointment with care management team member scheduled for:  1 month  Larinda Buttery, PharmD Clinical Pharmacist [facility] [phone]

## 2021-11-21 NOTE — Patient Instructions (Signed)
Visit Information   Thank you for taking time to visit with me today. Please don't hesitate to contact me if I can be of assistance to you before our next scheduled telephone appointment.  Following are the goals we discussed today:  Patient Goals/Self-Care Activities Over the next 30 days, patient will:  take medications as prescribed  Follow Up Plan: Telephone follow up appointment with care management team member scheduled for:  1 month    Please call the care guide team at [phone] if you need to cancel or reschedule your appointment.   Following is a copy of your full care plan:  Care Plan : Medication Management  Updates made by Darius Bump, RPH since 11/21/2021 12:00 AM     Problem: HLD, chronic pain, mental health      Long-Range Goal: Disease Progression Prevention   Start Date: 11/21/2021  This Visit's Progress: On track  Priority: High  Note:   Current Barriers:  None at present  Pharmacist Clinical Goal(s):  Over the next 30 days, patient will adhere to plan to optimize therapeutic regimen for chronic conditions as evidenced by report of adherence to recommended medication management changes through collaboration with PharmD and provider.   Interventions: 1:1 collaboration with Hali Marry, MD regarding development and update of comprehensive plan of care as evidenced by provider attestation and co-signature Inter-disciplinary care team collaboration (see longitudinal plan of care) Comprehensive medication review performed; medication list updated in electronic medical record  Hyperlipidemia:  Controlled; current treatment:rosuvastatin 39m daily; LDL 93, TG 245  Recommended continue current regimen Depression/Anxiety:  Controlled; current treatment:klonopin PRN, recently stopped sertraline & abilify, plan to start duloxetine;   Connected with [facility] support in February  Recommended continue current  regimen, and  Chronic Pain  Controlled; current treatment:oxcarbazepine 3074mBID, klonopin 61m26mID PRN, tizanidine 4mg4mery 6hr PRN; for breakthrough tries gabapentin first, then ketamine nasal spray  Recommended continue current regimen Patient Goals/Self-Care Activities Over the next 30 days, patient will:  take medications as prescribed  Follow Up Plan: Telephone follow up appointment with care management team member scheduled for:  1 month      Consent to CCM Services: Ms. BrocMossberg given information about Chronic Care Management services including:  CCM service includes personalized support from designated clinical staff supervised by her physician, including individualized plan of care and coordination with other care providers 24/7 contact phone numbers for assistance for urgent and routine care needs. Service will only be billed when office clinical staff spend 20 minutes or more in a month to coordinate care. Only one practitioner may furnish and bill the service in a calendar month. The patient may stop CCM services at any time (effective at the end of the month) by phone call to the office staff. The patient will be responsible for cost sharing (co-pay) of up to 20% of the service fee (after annual deductible is met).  Patient agreed to services and verbal consent obtained.   Patient verbalizes understanding of instructions provided today and agrees to view in MyChPellaTelephone follow up appointment with care management team member scheduled for:1 month  KeesDarius Bump

## 2021-11-28 DIAGNOSIS — G500 Trigeminal neuralgia: Secondary | ICD-10-CM | POA: Diagnosis not present

## 2021-11-28 DIAGNOSIS — G501 Atypical facial pain: Secondary | ICD-10-CM | POA: Diagnosis not present

## 2021-11-28 DIAGNOSIS — G894 Chronic pain syndrome: Secondary | ICD-10-CM | POA: Diagnosis not present

## 2021-11-28 DIAGNOSIS — Z79899 Other long term (current) drug therapy: Secondary | ICD-10-CM | POA: Diagnosis not present

## 2021-11-29 DIAGNOSIS — F17210 Nicotine dependence, cigarettes, uncomplicated: Secondary | ICD-10-CM

## 2021-11-29 DIAGNOSIS — F331 Major depressive disorder, recurrent, moderate: Secondary | ICD-10-CM | POA: Diagnosis not present

## 2021-11-29 DIAGNOSIS — G8929 Other chronic pain: Secondary | ICD-10-CM | POA: Diagnosis not present

## 2021-11-29 DIAGNOSIS — E7849 Other hyperlipidemia: Secondary | ICD-10-CM

## 2021-12-07 ENCOUNTER — Encounter (INDEPENDENT_AMBULATORY_CARE_PROVIDER_SITE_OTHER): Payer: Self-pay | Admitting: Family Medicine

## 2021-12-07 ENCOUNTER — Ambulatory Visit (INDEPENDENT_AMBULATORY_CARE_PROVIDER_SITE_OTHER): Payer: Medicare Other | Admitting: Family Medicine

## 2021-12-07 ENCOUNTER — Other Ambulatory Visit: Payer: Self-pay

## 2021-12-07 VITALS — BP 137/91 | HR 121 | Temp 98.0°F | Ht 65.0 in | Wt 206.0 lb

## 2021-12-07 DIAGNOSIS — Z6836 Body mass index (BMI) 36.0-36.9, adult: Secondary | ICD-10-CM

## 2021-12-07 DIAGNOSIS — F331 Major depressive disorder, recurrent, moderate: Secondary | ICD-10-CM | POA: Diagnosis not present

## 2021-12-07 DIAGNOSIS — R7303 Prediabetes: Secondary | ICD-10-CM

## 2021-12-07 DIAGNOSIS — G894 Chronic pain syndrome: Secondary | ICD-10-CM | POA: Diagnosis not present

## 2021-12-11 NOTE — Progress Notes (Signed)
Chief Complaint:   OBESITY Desiree Wilson is here to discuss her progress with her obesity treatment plan along with follow-up of her obesity related diagnoses. See Medical Weight Management Flowsheet for complete bioelectrical impedance results.  Today's visit was #: 9 Starting weight: 216 lbs Starting date: 05/11/2021 Weight change since last visit: 5 lbs Total lbs lost to date: 10 lbs Total weight loss percentage to date: -4.63%  Nutrition Plan: FODMAP for 50% of the time. Activity: None.  Interim History: Desiree Wilson had frittatas for breakfast.  She stopped breads, still having rice and noodles.  She says that her NP understands that Ketamine management is important.  She started Cymbalta last week and says it is starting to help.  Assessment/Plan:   1. Chronic pain syndrome, controlled currently Pernell continues to have Ketamine infusions.  We will continue to monitor symptoms as they relate to her weight loss journey.  2. Prediabetes Improving, but not optimized. Goal is HgbA1c < 5.7.  Medication: None.    Plan: She will continue to focus on protein-rich, low simple carbohydrate foods. We reviewed the importance of hydration, regular exercise for stress reduction, and restorative sleep.   Lab Results  Component Value Date   HGBA1C 5.8 (H) 05/11/2021   Lab Results  Component Value Date   INSULIN 17.6 05/11/2021   3. Moderate episode of recurrent major depressive disorder (HCC) [name] started taking Cymbalta last week and states she believes it is starting to help.  Plan:  Continue Cymbalta.  Will continue to monitor.  4. Obesity, current BMI 34.4  Course: Desiree Wilson is currently in the action stage of change. As such, her goal is to continue with weight loss efforts.   Nutrition goals: She has agreed to FODMAP.   Exercise goals:  As is.  Behavioral modification strategies: increasing lean protein intake, decreasing simple carbohydrates, increasing vegetables, and increasing  water intake.  Desiree Wilson has agreed to follow-up with our clinic in 6 weeks. She was informed of the importance of frequent follow-up visits to maximize her success with intensive lifestyle modifications for her multiple health conditions.   Objective:   Blood pressure (!) 137/91, pulse (!) 121, temperature 98 F (36.7 C), temperature source Oral, height 5\' 5"  (1.651 m), weight 206 lb (93.4 kg), SpO2 98 %. Body mass index is 34.28 kg/m.  General: Cooperative, alert, well developed, in no acute distress. HEENT: Conjunctivae and lids unremarkable. Cardiovascular: Regular rhythm.  Lungs: Normal work of breathing. Neurologic: No focal deficits.   Lab Results  Component Value Date   CREATININE 1.09 09/27/2021   BUN 15 09/27/2021   NA 137 09/27/2021   K 4.3 09/27/2021   CL 102 09/27/2021   CO2 25 09/27/2021   Lab Results  Component Value Date   ALT 9 09/27/2021   AST 12 09/27/2021   ALKPHOS 86 09/27/2021   BILITOT 0.3 09/27/2021   Lab Results  Component Value Date   HGBA1C 5.8 (H) 05/11/2021   HGBA1C 5.6 07/15/2017   HGBA1C 5.5 03/27/2013   Lab Results  Component Value Date   INSULIN 17.6 05/11/2021   Lab Results  Component Value Date   TSH 0.933 05/11/2021   Lab Results  Component Value Date   CHOL 203 (H) 03/02/2021   HDL 76 03/02/2021   LDLCALC 93 03/02/2021   TRIG 245 (H) 03/02/2021   CHOLHDL 2.7 03/02/2021   Lab Results  Component Value Date   VD25OH 18.9 (L) 05/11/2021   VD25OH 13 (L) 05/14/2016  VD25OH 27 (L) 08/25/2014   Lab Results  Component Value Date   WBC 11.4 (H) 09/27/2021   HGB 13.6 09/27/2021   HCT 41.5 09/27/2021   MCV 80.4 09/27/2021   PLT 191.0 09/27/2021   Lab Results  Component Value Date   IRON 113 05/11/2021   TIBC 398 05/11/2021   FERRITIN 24 05/11/2021   Attestation Statements:   Reviewed by clinician on day of visit: allergies, medications, problem list, medical history, surgical history, family history, social history,  and previous encounter notes.  I, Water quality scientist, CMA, am acting as transcriptionist for Briscoe Deutscher, DO  I have reviewed the above documentation for accuracy and completeness, and I agree with the above. -  Briscoe Deutscher, DO, MS, FAAFP, DABOM - Family and Bariatric Medicine.

## 2021-12-14 ENCOUNTER — Other Ambulatory Visit: Payer: Self-pay

## 2021-12-14 ENCOUNTER — Ambulatory Visit (INDEPENDENT_AMBULATORY_CARE_PROVIDER_SITE_OTHER): Payer: Medicare Other | Admitting: Family Medicine

## 2021-12-14 ENCOUNTER — Encounter: Payer: Self-pay | Admitting: Family Medicine

## 2021-12-14 VITALS — BP 159/95 | HR 102 | Temp 97.7°F | Resp 18 | Ht 65.0 in | Wt 211.0 lb

## 2021-12-14 DIAGNOSIS — M7021 Olecranon bursitis, right elbow: Secondary | ICD-10-CM

## 2021-12-14 DIAGNOSIS — J069 Acute upper respiratory infection, unspecified: Secondary | ICD-10-CM

## 2021-12-14 DIAGNOSIS — Z23 Encounter for immunization: Secondary | ICD-10-CM

## 2021-12-14 DIAGNOSIS — F331 Major depressive disorder, recurrent, moderate: Secondary | ICD-10-CM | POA: Diagnosis not present

## 2021-12-14 NOTE — Assessment & Plan Note (Signed)
Far she has been doing really well with the Cymbalta.  She does feel like it might be making her feel little bit more tired.  So we discussed maybe transitioning the medication to bedtime to see if that makes a difference it may also improve over the next several weeks.  I will see her back in about 8 weeks to make sure that she is still doing well.

## 2021-12-14 NOTE — Progress Notes (Signed)
Acute Office Visit  Subjective:    Patient ID: Desiree Wilson, female    DOB: [birth year], 55 y.o.   MRN: [id number]  Chief Complaint  Patient presents with   Elbow Injury    Injured right elbow 2 years ago after falling in the shower. Elbow is swelling/painful, 3 weeks    Cough    Productive cough, 1 week    Discuss Medication    Cymbalta. Patient complains of fatigue and would like to know if the Cymbalta is causing the fatigue     HPI Patient is in today for   Injured right elbow 2 years ago after falling in the shower.  We did an aspiration of the bursa at that time.  Today she is complaining that her right elbow elbow is swelling/painful, 3 weeks.  This time she does not member any specific trauma or injury but she does tend to rest her elbow on the arm rest in her car.  F/U MDD - Cymbalta.  So far she actually feels like its been really helpful she has been excited about how well it seems to be working.  She is been on it for about 16 days at this point.  Patient complains of fatigue and would like to know if the Cymbalta is causing the fatigue.  She is planning on getting back in to the gym and doing some swimming in January.  Cough x 1 weeks -she has not had any sore throat or ear pain or fever or chills.  She has had a lot of pressure in her left maxillary sinus and she has been coughing up some ground brown-colored sputum.  She actually feels like its been getting just a little bit better she is been using some cough medicine, drops, Flonase and saline.   Past Medical History:  Diagnosis Date   Allergy    rhinitis   Anemia    Anxiety    Blood transfusion    x2 1983 surgery blood loss   Blood transfusion without reported diagnosis    Depression    Depression    Facial paralysis    right face/ chronic pain syndrome (pain management Dr Sydell Axon)   [id number](478.2)    Hyperlipidemia    Hypotension    Iron deficiency anemia 11/15/2014   Iron malabsorption 11/15/2014    Osteoarthritis    Trigeminal neuralgia    right   Trigeminal neuralgia 2002    Past Surgical History:  Procedure Laterality Date   COLONOSCOPY     ESOPHAGOGASTRODUODENOSCOPY     gamma knife Right 01/2018   MANDIBLE SURGERY     NASAL SINUS SURGERY     OSTEOTOMY     plastic facial surgery     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    Family History  Problem Relation Age of Onset   Breast cancer Mother        lumpectomy and radiation   Obesity Mother    Stroke Father    Diverticulitis Father    Hypertension Father    Hyperlipidemia Father    Depression Father    Alzheimer's disease Father    Colon cancer Neg Hx     Social History   Socioeconomic History   Marital status: Significant Other    Spouse name: [name]   Number of children: 0   Years of education: Masters Degree   Highest education level: Master's degree (e.g., MA, MS, MEng, MEd, MSW, MBA)  Occupational History  Occupation: disabled/self employed  Tobacco Use   Smoking status: Some Days    Years: 6.00    Types: Cigarettes    Last attempt to quit: 12/31/2000    Years since quitting: 20.9   Smokeless tobacco: Never   Tobacco comments:    tobacco infor given  Vaping Use   Vaping Use: Former   Start date: 06/30/2018   Substances: Nicotine  Substance and Sexual Activity   Alcohol use: No    Alcohol/week: 0.0 standard drinks   Drug use: No   Sexual activity: Yes    Partners: Male  Other Topics Concern   Not on file  Social History Narrative   Wants to start back exercising. Goal is to loose 20 to 40 lbs in the next year. Coffee in the morning and tea during the day   Social Determinants of Health   Financial Resource Strain: Not on file  Food Insecurity: Not on file  Transportation Needs: Not on file  Physical Activity: Not on file  Stress: Not on file  Social Connections: Not on file  Intimate Partner Violence: Not on file    Outpatient Medications Prior to  Visit  Medication Sig Dispense Refill   AMBULATORY NON FORMULARY MEDICATION Ketamine nose spray: use 2 sprays prn pain, up to 10 sprays a day     Carboxymethylcellulose Sodium 1 % GEL Apply to eye as needed.     clonazePAM (KLONOPIN) 1 MG tablet Take 1 tablet (1 mg total) by mouth 2 (two) times daily. 30 tablet 2   colestipol (COLESTID) 1 g tablet Take 1 tablet (1 g total) by mouth 2 (two) times daily. 60 tablet 3   drospirenone-ethinyl estradiol (YAZ) 3-0.02 MG tablet Take 1 tablet by mouth daily. 84 tablet 3   DULoxetine (CYMBALTA) 30 MG capsule Take 1 capsule (30 mg total) by mouth daily. 30 capsule 1   ferrous fumarate (HEMOCYTE - 106 MG FE) 325 (106 Fe) MG TABS tablet Take 1 tablet by mouth every other day.     fluticasone (FLONASE) 50 MCG/ACT nasal spray Place 2 sprays into both nostrils daily. 16 g 6   frovatriptan (FROVA) 2.5 MG tablet Take 2.5 mg by mouth daily as needed.     gabapentin (NEURONTIN) 600 MG tablet Take 600 mg by mouth 3 (three) times daily.     IBUPROFEN PO Take by mouth as needed.     ketamine (KETALAR) 10 MG/ML injection Inject into the vein every 3 (three) months.      loperamide (IMODIUM A-D) 2 MG tablet Take 1 tablet (2 mg total) by mouth as needed for diarrhea or loose stools. 30 tablet 0   Multiple Vitamin (MULTIVITAMIN) capsule Take 1 capsule by mouth daily.     omeprazole (PRILOSEC) 40 MG capsule TAKE ONE CAPSULE BY MOUTH DAILY 90 capsule 3   Oxcarbazepine (TRILEPTAL) 300 MG tablet Take 300 mg by mouth 2 (two) times daily.     Probiotic Product (PROBIOTIC PO) Take by mouth.     promethazine (PHENERGAN) 25 MG tablet Take 1 tablet (25 mg total) by mouth every 8 (eight) hours as needed for nausea or vomiting. 20 tablet 0   Pseudoephedrine-Acetaminophen (SINUS PO) Take by mouth as needed.     rosuvastatin (CRESTOR) 20 MG tablet TAKE 1 TABLET BY MOUTH DAILY 90 tablet 3   tiZANidine (ZANAFLEX) 4 MG tablet Take 4 mg by mouth every 6 (six) hours as needed.      traZODone (DESYREL) 100 MG tablet TAKE 2  TABLETS(200 MG) BY MOUTH AT BEDTIME AS NEEDED FOR SLEEP 180 tablet 1   vitamin B-12 (CYANOCOBALAMIN) 1000 MCG tablet Take 1,000 mcg by mouth daily.     vitamin C (ASCORBIC ACID) 500 MG tablet Take 500 mg by mouth daily.     Vitamin D, Ergocalciferol, (DRISDOL) 1.25 MG (50000 UNIT) CAPS capsule Take 1 capsule (50,000 Units total) by mouth every 7 (seven) days. 12 capsule 0   No facility-administered medications prior to visit.    Allergies  Allergen Reactions   Azithromycin Rash    REACTION: C Diff Other reaction(s): Other (See Comments), Other (See Comments) REACTION: C Diff REACTION: C Diff REACTION: C Diff    Methadone     Other reaction(s): Hallucinations, Other (See Comments), Other (See Comments) Hallucinations Hallucinations Hallucinations    Tapentadol Anxiety    Other reaction(s): Dizziness   Phenobarbital     REACTION: Excited   Wellbutrin [Bupropion] Other (See Comments)    Not effective.     Review of Systems     Objective:    Physical Exam Constitutional:      Appearance: She is well-developed.  HENT:     Head: Normocephalic and atraumatic.     Right Ear: External ear normal.     Left Ear: External ear normal.     Nose: Nose normal.  Eyes:     Conjunctiva/sclera: Conjunctivae normal.     Pupils: Pupils are equal, round, and reactive to light.  Neck:     Thyroid: No thyromegaly.  Cardiovascular:     Rate and Rhythm: Normal rate and regular rhythm.     Heart sounds: Normal heart sounds.  Pulmonary:     Effort: Pulmonary effort is normal.     Breath sounds: Normal breath sounds. No wheezing.  Musculoskeletal:     Cervical back: Neck supple.  Lymphadenopathy:     Cervical: No cervical adenopathy.  Skin:    General: Skin is warm and dry.     Comments: Large olecranon bursitis.  Neurological:     Mental Status: She is alert and oriented to person, place, and time.    BP (!) 159/95    Pulse (!) 102    Temp  97.7 F (36.5 C)    Resp 18    Ht '5\' 5"'  (1.651 m)    Wt 211 lb (95.7 kg)    SpO2 96%    BMI 35.11 kg/m  Wt Readings from Last 3 Encounters:  12/14/21 211 lb (95.7 kg)  12/07/21 206 lb (93.4 kg)  11/09/21 214 lb (97.1 kg)    Health Maintenance Due  Topic Date Due   HIV Screening  Never done   Hepatitis C Screening  Never done    There are no preventive care reminders to display for this patient.   Lab Results  Component Value Date   TSH 0.933 05/11/2021   Lab Results  Component Value Date   WBC 11.4 (H) 09/27/2021   HGB 13.6 09/27/2021   HCT 41.5 09/27/2021   MCV 80.4 09/27/2021   PLT 191.0 09/27/2021   Lab Results  Component Value Date   NA 137 09/27/2021   K 4.3 09/27/2021   CO2 25 09/27/2021   GLUCOSE 106 (H) 09/27/2021   BUN 15 09/27/2021   CREATININE 1.09 09/27/2021   BILITOT 0.3 09/27/2021   ALKPHOS 86 09/27/2021   AST 12 09/27/2021   ALT 9 09/27/2021   PROT 7.7 09/27/2021   ALBUMIN 4.0 09/27/2021   CALCIUM 9.3 09/27/2021  EGFR 62 07/28/2021   GFR 57.38 (L) 09/27/2021   Lab Results  Component Value Date   CHOL 203 (H) 03/02/2021   Lab Results  Component Value Date   HDL 76 03/02/2021   Lab Results  Component Value Date   LDLCALC 93 03/02/2021   Lab Results  Component Value Date   TRIG 245 (H) 03/02/2021   Lab Results  Component Value Date   CHOLHDL 2.7 03/02/2021   Lab Results  Component Value Date   HGBA1C 5.8 (H) 05/11/2021       Assessment & Plan:   Problem List Items Addressed This Visit       Other   Depression    Far she has been doing really well with the Cymbalta.  She does feel like it might be making her feel little bit more tired.  So we discussed maybe transitioning the medication to bedtime to see if that makes a difference it may also improve over the next several weeks.  I will see her back in about 8 weeks to make sure that she is still doing well.      Other Visit Diagnoses     Need for immunization against  influenza    -  Primary   Relevant Orders   Flu Vaccine QUAD 59moIM (Fluarix, Fluzone & Alfiuria Quad PF) (Completed)   Need for shingles vaccine       Relevant Orders   Varicella-zoster vaccine IM (Shingrix) (Completed)   Olecranon bursitis of right elbow       Viral upper respiratory tract infection          Upper respiratory infection-likely viral she has had symptoms with cough and congestion for 1 week.  But feels like it is getting a little better so I did encourage her to give it through the weekend just to see if she continues to improve.  If but if symptoms persist or worsen please give uKoreaa call back and we will treat for sinusitis at that point in time.  Her chest was clear on exam today.  Right olecranon bursitis-discussed diagnosis she has had this before couple years ago and she would like to have it aspirated today.  Procedure performed and patient tolerated well without any problems or complications.  Recommend compressive wrapping for the next several days and avoiding trauma irritation and pressure on the olecranon.  Bursa Aspiration without Injection Procedure Note  Pre-operative Diagnosis: right Olecranon bursitis  Post-operative Diagnosis: same  Indications: Diagnosis and treatment of symptomatic bursal effusion  Anesthesia: Lidocaine 1% without epinephrine without added sodium bicarbonate  Procedure Details   After a discussion of the risks and benefits with the patient (including the possibility that any manipulation of the bursa could introduce infection, worsening the current situation significantly), verbal consent was obtained for the procedure. The joint was prepped with chlorhexidine and a small wheel of local anesthesia was injected into the subcutaneous tissue. An 18 gauge needle was introduced into the fluid collection and 5 ml of serosanguinous fluid was removed and discarded. No corticosteroids were given. The injection site was cleansed with topical  isopropyl alcohol and a dressing was applied for mild compression.  Complications:  None; patient tolerated the procedure well.   No orders of the defined types were placed in this encounter.    CBeatrice Lecher MD

## 2021-12-21 ENCOUNTER — Telehealth: Payer: Self-pay | Admitting: Family Medicine

## 2021-12-21 NOTE — Telephone Encounter (Signed)
Patient called to cancel appt on 12/29 with Pharmacist and would like a call back to reschedule for another day.

## 2021-12-21 NOTE — Chronic Care Management (AMB) (Signed)
°  Care Management   Note  12/21/2021 Name: Desiree Wilson MRN: [id number] DOB: Jun 02, 1966  Desiree Wilson is a 55 y.o. year old female who is a primary care patient of Hali Marry, MD and is actively engaged with the care management team. I reached out to Desiree Wilson by phone today to assist with re-scheduling a follow up visit with the Pharmacist  Follow up plan: Unsuccessful telephone outreach attempt made. A HIPAA compliant phone message was left for the patient providing contact information and requesting a return call.   Julian Hy, [facility] Management  Direct Dial: [phone]

## 2021-12-28 ENCOUNTER — Telehealth: Payer: Medicare Other

## 2021-12-29 ENCOUNTER — Telehealth (INDEPENDENT_AMBULATORY_CARE_PROVIDER_SITE_OTHER): Payer: Medicare Other | Admitting: Family Medicine

## 2021-12-29 ENCOUNTER — Encounter: Payer: Self-pay | Admitting: Family Medicine

## 2021-12-29 DIAGNOSIS — U071 COVID-19: Secondary | ICD-10-CM

## 2021-12-29 MED ORDER — MOLNUPIRAVIR EUA 200MG CAPSULE: 4.0000 | ORAL_CAPSULE | Freq: Two times a day (BID) | ORAL | 0 refills | Status: AC

## 2021-12-29 MED ORDER — GUAIFENESIN ER 600 MG PO TB12: 1200.0000 mg | ORAL_TABLET | Freq: Two times a day (BID) | ORAL | 2 refills | Status: DC

## 2021-12-29 NOTE — Patient Instructions (Signed)
Molnupiravir patient fact sheet: http://www.craig-choi.com/   Over the counter medications that may be helpful for symptoms:  Guaifenesin 1200 mg extended release tabs twice daily, with plenty of water For cough and congestion Brand name: Mucinex   Pseudoephedrine 30 mg, one or two tabs every 4 to 6 hours For sinus congestion Brand name: Sudafed You must get this from the pharmacy counter.  Oxymetazoline nasal spray each morning, one spray in each nostril, for NO MORE THAN 3 days  For nasal and sinus congestion Brand name: Afrin Saline nasal spray or Saline Nasal Irrigation 3-5 times a day For nasal and sinus congestion Brand names: Ocean or AYR Fluticasone nasal spray, one spray in each nostril, each morning (after oxymetazoline and saline, if used) For nasal and sinus congestion Brand name: Flonase Warm salt water gargles  For sore throat Every few hours as needed Alternate ibuprofen 400-600 mg and acetaminophen 1000 mg every 4-6 hours For fever, body aches, headache Brand names: Motrin or Advil and Tylenol Dextromethorphan 12-hour cough version 30 mg every 12 hours  For cough Brand name: Delsym Stop all other cold medications for now (Nyquil, Dayquil, Tylenol Cold, Theraflu, etc) and other non-prescription cough/cold preparations. Many of these have the same ingredients listed above and could cause an overdose of medication.   Herbal treatments that have been shown to be helpful in some patients include: Vitamin C 1000mg  per day Vitamin D 4000iU per day Zinc 100mg  per day Quercetin 25-500mg  twice a day Melatonin 5-10mg  at bedtime  General Instructions Allow your body to rest Drink PLENTY of fluids Isolate yourself from everyone, even family, until test results have returned  If your COVID-19 test is positive Then you ARE INFECTED and you can pass the virus to others You must quarantine from others for a minimum of  5 days since symptoms started  AND You are fever free for 24 hours WITHOUT any medication to reduce fever AND Your symptoms are improving Wear mask for the next 5 days If not improved by day 5, quarantine for the full 10 days. Do not go to the store or other public areas Do not go around household members who are not known to be infected with COVID-19 If you MUST leave your area of quarantine (example: go to a bathroom you share with others in your home), you must Wear a mask Wash your hands thoroughly Wipe down any surfaces you touch Do not share food, drinks, towels, or other items with other persons Dispose of your own tissues, food containers, etc  Once you have recovered, please continue good preventive care measures, including:  wearing a mask when in public wash your hands frequently avoid touching your face/nose/eyes cover coughs/sneezes with the inside of your elbow stay out of crowds keep a 6 foot distance from others  If you develop severe shortness of breath, uncontrolled fevers, coughing up blood, confusion, chest pain, or signs of dehydration (such as significantly decreased urine amounts or dizziness with standing) please go to the ER.

## 2021-12-29 NOTE — Progress Notes (Signed)
Virtual Video Visit via MyChart Note  I connected with  [name] on 12/29/21 at  8:30 AM EST by the video enabled telemedicine application for MyChart, and verified that I am speaking with the correct person using two identifiers.   I introduced myself as a Designer, jewellery with the practice. We discussed the limitations of evaluation and management by telemedicine and the availability of in person appointments. The patient expressed understanding and agreed to proceed.  Participating parties in this visit include: The patient and the nurse practitioner listed.  The patient is: At home I am: In the office - [facility]  Subjective:    CC:  Chief Complaint  Patient presents with   Covid Positive    HPI: Desiree Wilson is a 55 y.o. year old female presenting today via [place] today for COVID positive.  Patient started feeling bad 2 days ago and had a positive COVID test yesterday. Several relatives that she saw over Christmas also have [place] now. She is experiencing body aches, fatigue, sore throat, ear/sinus pressure, headaches, weakness, cough, nasal congestion, and occasional nausea. She denies any chest pain , dyspnea, wheezing, vomiting, diarrhea, rashes, lethargy. She has been getting minimal relief with OTC medications.     Past medical history, Surgical history, Family history not pertinant except as noted below, Social history, Allergies, and medications have been entered into the medical record, reviewed, and corrections made.   Review of Systems:  All review of systems negative except what is listed in the HPI   Objective:    General:  Speaking clearly in complete sentences. Absent shortness of breath noted.   Alert and oriented x3.   Normal judgment.  Absent acute distress.   Impression and Recommendations:    1. COVID-19 - molnupiravir EUA (LAGEVRIO) 200 mg CAPS capsule; Take 4 capsules (800 mg total) by mouth 2 (two) times daily for 5  days.  Dispense: 40 capsule; Refill: 0 - guaiFENesin (MUCINEX) 600 MG 12 hr tablet; Take 2 tablets (1,200 mg total) by mouth 2 (two) times daily.  Dispense: 30 tablet; Refill: 2   Starting molnupiravir. Link to patient information, OTC meds, and quarantine guidelines added to AVS. Continue supportive measures including rest, hydration, humidifier use, steam showers, warm compresses to sinuses, warm liquids with lemon and honey, and over-the-counter cough, cold, and analgesics as needed. Patient aware of signs/symptoms requiring further/urgent evaluation.   Follow-up if symptoms worsen or fail to improve.    I discussed the assessment and treatment plan with the patient. The patient was provided an opportunity to ask questions and all were answered. The patient agreed with the plan and demonstrated an understanding of the instructions.   The patient was advised to call back or seek an in-person evaluation if the symptoms worsen or if the condition fails to improve as anticipated.  I spent 20 minutes dedicated to the care of this patient on the date of this encounter to include pre-visit chart review of prior notes and results, face-to-face time with the patient, and post-visit ordering of testing as indicated.   Terrilyn Saver, NP

## 2022-01-03 NOTE — Chronic Care Management (AMB) (Addendum)
°  Care Management   Note  01/03/2022 Name: Desiree Wilson MRN: [id number] DOB: Nov 27, 1966  Desiree Wilson is a 56 y.o. year old female who is a primary care patient of Hali Marry, MD and is actively engaged with the care management team. I reached out to [name] by phone today to assist with re-scheduling a follow up visit with the Pharmacist  Follow up plan: Follow up phone call scheduled for 01/05/2022  Julian Hy, [facility] Management  Direct Dial: [phone]

## 2022-01-04 ENCOUNTER — Encounter: Payer: Self-pay | Admitting: Gastroenterology

## 2022-01-04 DIAGNOSIS — G894 Chronic pain syndrome: Secondary | ICD-10-CM | POA: Diagnosis not present

## 2022-01-04 DIAGNOSIS — R110 Nausea: Secondary | ICD-10-CM | POA: Diagnosis not present

## 2022-01-04 DIAGNOSIS — G501 Atypical facial pain: Secondary | ICD-10-CM | POA: Diagnosis not present

## 2022-01-04 DIAGNOSIS — F419 Anxiety disorder, unspecified: Secondary | ICD-10-CM | POA: Diagnosis not present

## 2022-01-04 MED ORDER — COLESTIPOL HCL 1 G PO TABS: 1.0000 g | ORAL_TABLET | Freq: Two times a day (BID) | ORAL | 6 refills | Status: DC

## 2022-01-04 NOTE — Telephone Encounter (Signed)
Colestipol RX refill sent to pharmacy on file. F/U appt with Dr. Havery Moros on Friday, 03/02/22 at 9 am. Pt notified via my chart.

## 2022-01-05 ENCOUNTER — Ambulatory Visit (INDEPENDENT_AMBULATORY_CARE_PROVIDER_SITE_OTHER): Payer: Medicare Other | Admitting: Pharmacist

## 2022-01-05 ENCOUNTER — Other Ambulatory Visit: Payer: Self-pay

## 2022-01-05 DIAGNOSIS — E7849 Other hyperlipidemia: Secondary | ICD-10-CM

## 2022-01-05 DIAGNOSIS — F325 Major depressive disorder, single episode, in full remission: Secondary | ICD-10-CM

## 2022-01-05 DIAGNOSIS — G894 Chronic pain syndrome: Secondary | ICD-10-CM

## 2022-01-05 NOTE — Progress Notes (Signed)
Chronic Care Management Pharmacy Note  01/05/2022 Name:  Desiree Wilson MRN:  [id number] DOB:  03/28/1966  Summary: addressed HLD mental health, chronic pain. Patient is doing really well on her new medications, duloxetine and colestid. Patient experienced drowsiness with duloxetine but collaborated with PCP to adjust to bedtime dosing and this is working Recruitment consultant for her.  Recommendations/Changes made from today's visit: none, doing well.  Plan: f/u with pharmacist in 6 months  Subjective: Desiree Wilson is an 56 y.o. year old female who is a primary patient of Metheney, Rene Kocher, MD.  The CCM team was consulted for assistance with disease management and care coordination needs.    Engaged with patient by telephone for follow up visit in response to provider referral for pharmacy case management and/or care coordination services.   Consent to Services:  The patient was given information about Chronic Care Management services, agreed to services, and gave verbal consent prior to initiation of services.  Please see initial visit note for detailed documentation.   Patient Care Team: Hali Marry, MD as PCP - General (Family Medicine) Elmarie Shiley, MD as Consulting Physician (Nephrology) Lucia Bitter., MD as Consulting Physician (Pain Medicine) Darius Bump, [facility] as Pharmacist (Pharmacist)  Recent office visits:  08/11/21 Luetta Nutting DO - Seen for diarrhea - Labs ordered - No medication changes noted - No follow up noted  07/28/21 Iran Planas PA - Seen for lower abdominal pain - Labs ordered - No medication changes noted - No follow up noted  07/17/21 Hali Marry MD - Seen for Need for Zostavax administration - discontinue Abilify and switch to Lybalvi 5-10 MG TABS 1 tablet daily - Follow up in 7 weeks  05/30/21 Hali Marry MD - Seen for Acute non-recurrent sinusitis - Start amoxicillin-clavulanate (AUGMENTIN) 875-125 MG tablet, Take 1 tablet by  mouth 2 (two) times daily - No follow up noted        Recent consult visits:  10/12/21 Yetta Flock MD - Gastroenterology - Seen for procedure for chronic diarrhea - No medication changes noted - No follow up noted  09/27/21 Brendolyn Patty - Gastroenterology - Seen for Iron deficiency anemia - Labs ordered - Started Florastor Probiotic twice a day - Order for colonoscopy - No follow up noted  09/20/21 Abby Potash PA - [place] visit, seen for diarrhea - No medication changes noted - Follow up in 3 weeks  09/05/21  [place] for Facial pain - No medication changes noted - Follow up in 12 weeks  08/30/21 [place] visit, seen for diarrhea - No medication changes noted - Follow up in 4 weeks  08/09/21 [place] visit, seen for vitamin D deficiency - continue vitamin D 1.25 MG (50000 UNIT) CAPS capsule; Take 1 capsule (50,000 Units total) by mouth every 7 (seven) days. - Follow up in 3 weeks  07/19/21 [place] for prediabetes - No medication changes noted - Follow up in 3 weeks    Hospital visits:  None in previous 6 months  Objective:  Lab Results  Component Value Date   CREATININE 1.09 09/27/2021   CREATININE 1.07 (H) 07/28/2021   CREATININE 0.94 05/11/2021    Lab Results  Component Value Date   HGBA1C  5.8 (H) 05/11/2021   Last diabetic Eye exam: No results found for: HMDIABEYEEXA  Last diabetic Foot exam: No results found for: HMDIABFOOTEX      Component Value Date/Time   CHOL 203 (H) 03/02/2021 0000   TRIG 245 (H) 03/02/2021 0000   HDL 76 03/02/2021 0000   CHOLHDL 2.7 03/02/2021 0000   VLDL 54 (H) 03/05/2017 0916   LDLCALC 93 03/02/2021 0000    Hepatic Function Latest Ref Rng & Units 09/27/2021 07/28/2021 05/11/2021  Total Protein 6.0 -  8.3 g/dL 7.7 7.3 6.8  Albumin 3.5 - 5.2 g/dL 4.0 - 4.4  AST 0 - 37 U/L '12 21 20  ' ALT 0 - 35 U/L '9 18 17  ' Alk Phosphatase 39 - 117 U/L 86 - 86  Total Bilirubin 0.2 - 1.2 mg/dL 0.3 0.3 0.2    Lab Results  Component Value Date/Time   TSH 0.933 05/11/2021 10:26 AM   TSH 1.21 01/19/2020 10:47 AM   FREET4 0.92 05/11/2021 10:26 AM    CBC Latest Ref Rng & Units 09/27/2021 07/28/2021 05/11/2021  WBC 4.0 - 10.5 K/uL 11.4(H) 12.2(H) 11.4(H)  Hemoglobin 12.0 - 15.0 g/dL 13.6 14.7 13.5  Hematocrit 36.0 - 46.0 % 41.5 46.1(H) 42.0  Platelets 150.0 - 400.0 K/uL 191.0 234 216    Lab Results  Component Value Date/Time   VD25OH 18.9 (L) 05/11/2021 10:26 AM   VD25OH 13 (L) 05/14/2016 10:30 AM    Social History   Tobacco Use  Smoking Status Some Days   Years: 6.00   Types: Cigarettes   Last attempt to quit: 12/31/2000   Years since quitting: 21.0  Smokeless Tobacco Never  Tobacco Comments   tobacco infor given   BP Readings from Last 3 Encounters:  12/14/21 (!) 159/95  12/07/21 (!) 137/91  11/09/21 120/88   Pulse Readings from Last 3 Encounters:  12/14/21 (!) 102  12/07/21 (!) 121  11/09/21 89   Wt Readings from Last 3 Encounters:  12/14/21 211 lb (95.7 kg)  12/07/21 206 lb (93.4 kg)  11/09/21 214 lb (97.1 kg)    Assessment: Review of patient past medical history, allergies, medications, health status, including review of consultants reports, laboratory and other test data, was performed as part of comprehensive evaluation and provision of chronic care management services.   SDOH:  (Social Determinants of Health) assessments and interventions performed:    CCM Care Plan  Allergies  Allergen Reactions   Azithromycin Rash    REACTION: C Diff Other reaction(s): Other (See Comments), Other (See Comments) REACTION: C Diff REACTION: C Diff REACTION: C Diff    Methadone     Other reaction(s): Hallucinations, Other (See Comments), Other (See  Comments) Hallucinations Hallucinations Hallucinations    Tapentadol Anxiety    Other reaction(s): Dizziness   Phenobarbital     REACTION: Excited   Wellbutrin [Bupropion] Other (See Comments)    Not effective.     Medications Reviewed Today     Reviewed by Terrilyn Saver, NP (Nurse Practitioner) on 12/29/21 at [place] Status: <None>   Medication Order Taking? Sig Documenting Provider Last Dose Status Informant  AMBULATORY NON FORMULARY MEDICATION [id number] No Ketamine nose spray: use 2 sprays prn pain, up to 10 sprays a day [provider] Taking Active   Carboxymethylcellulose Sodium 1 % GEL [id number] No Apply to eye as needed. [provider] Taking Active   clonazePAM (KLONOPIN) 1 MG tablet [id number] No Take 1 tablet (1 mg total) by mouth 2 (  two) times daily. Bowen, Collene Leyden, DO Taking Active   colestipol (COLESTID) 1 g tablet [id number] No Take 1 tablet (1 g total) by mouth 2 (two) times daily. Yetta Flock, MD Taking Active   drospirenone-ethinyl estradiol (YAZ) 3-0.02 MG tablet [id number] No Take 1 tablet by mouth daily. Hali Marry, MD Taking Active   DULoxetine (CYMBALTA) 30 MG capsule [id number] No Take 1 capsule (30 mg total) by mouth daily. Hali Marry, MD Taking Active   ferrous fumarate (HEMOCYTE - 106 MG FE) 325 (106 Fe) MG TABS tablet [id number] No Take 1 tablet by mouth every other day. [provider] Taking Active   fluticasone (FLONASE) 50 MCG/ACT nasal spray [id number] No Place 2 sprays into both nostrils daily. Hali Marry, MD Taking Active   frovatriptan (FROVA) 2.5 MG tablet [id number] No Take 2.5 mg by mouth daily as needed. [provider] Taking Active   gabapentin (NEURONTIN) 600 MG tablet [id number] No Take 600 mg by mouth 3 (three) times daily. [provider] Taking Active   guaiFENesin (MUCINEX) 600 MG 12 hr tablet [id number] Yes Take 2 tablets (1,200 mg total) by mouth 2 (two)  times daily. Terrilyn Saver, NP  Active   IBUPROFEN PO [id number] No Take by mouth as needed. [provider] Taking Active   ketamine (KETALAR) 10 MG/ML injection [id number] No Inject into the vein every 3 (three) months.  [provider] Taking Active            Med Note Maryruth Eve, TONYA L   Wed Apr 27, 2020 10:58 AM)    loperamide (IMODIUM A-D) 2 MG tablet [id number] No Take 1 tablet (2 mg total) by mouth as needed for diarrhea or loose stools. Yetta Flock, MD Taking Active   molnupiravir EUA (LAGEVRIO) 200 mg CAPS capsule [id number] Yes Take 4 capsules (800 mg total) by mouth 2 (two) times daily for 5 days. Terrilyn Saver, NP  Active   Multiple Vitamin (MULTIVITAMIN) capsule [id number] No Take 1 capsule by mouth daily. [provider] Taking Active   omeprazole (PRILOSEC) 40 MG capsule [id number] No TAKE ONE CAPSULE BY MOUTH DAILY Hali Marry, MD Taking Active   Oxcarbazepine (TRILEPTAL) 300 MG tablet [id number] No Take 300 mg by mouth 2 (two) times daily. [provider] Taking Active   Probiotic Product (PROBIOTIC PO) [id number] No Take by mouth. [provider] Taking Active   promethazine (PHENERGAN) 25 MG tablet [id number] No Take 1 tablet (25 mg total) by mouth every 8 (eight) hours as needed for nausea or vomiting. Hali Marry, MD Taking Active   Pseudoephedrine-Acetaminophen (SINUS PO) [id number] No Take by mouth as needed. [provider] Taking Active   rosuvastatin (CRESTOR) 20 MG tablet [id number] No TAKE 1 TABLET BY MOUTH DAILY Hali Marry, MD Taking Active   tiZANidine (ZANAFLEX) 4 MG tablet [id number] No Take 4 mg by mouth every 6 (six) hours as needed. [provider] Taking Active            Med Note Maryruth Eve, Leanna Battles   Wed Apr 27, 2020 10:58 AM)    traZODone (DESYREL) 100 MG tablet [id number] No TAKE 2 TABLETS(200 MG) BY MOUTH AT BEDTIME AS NEEDED FOR SLEEP Hali Marry, MD Taking  Active   vitamin B-12 (CYANOCOBALAMIN) 1000 MCG tablet [id number] No Take 1,000 mcg by mouth daily. [provider] Taking Active   vitamin C (ASCORBIC ACID) 500 MG tablet [id number] No Take 500 mg  by mouth daily. [provider] Taking Active   Vitamin D, Ergocalciferol, (DRISDOL) 1.25 MG (50000 UNIT) CAPS capsule [id number] No Take 1 capsule (50,000 Units total) by mouth every 7 (seven) days. Briscoe Deutscher, DO Taking Active   Med List Note Hali Marry, MD 01/14/17 1333):              Patient Active Problem List   Diagnosis Date Noted   Diarrhea 08/11/2021   Prediabetes 05/30/2021   Low serum ferritin level 05/30/2021   B12 deficiency 05/30/2021   Chronic pain syndrome 05/30/2021   Recurrent major depressive disorder, in partial remission ([place]) 05/30/2021   Class 2 severe obesity with serious comorbidity and body mass index (BMI) of 36.0 to 36.9 in adult [facility]) 05/30/2021   Primary insomnia 02/02/2021   Snoring 10/26/2020   BMI 38.0-38.9,adult 10/26/2020   Menorrhagia 06/10/2020   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   Chronic diarrhea 07/15/2017   Deafness in right ear 07/12/2016   Gastroesophageal reflux disease with esophagitis 06/11/2016   Vitamin D deficiency 06/06/2016   Myofascial pain 02/29/2016   Abnormal brain MRI 07/18/2015   Chronic insomnia 12/16/2014   Iron deficiency anemia 11/15/2014   Iron malabsorption 11/15/2014   CKD (chronic kidney disease) stage 3, GFR 30-59 ml/min (HCC) 08/26/2014   Chronic migraine without aura, intractable, without status migrainosus 01/25/2014   Trigeminal neuralgia 07/22/2012   ANKLE PAIN, LEFT 03/06/2011   FATIGUE 08/10/2010   HYPERSOMNIA 08/09/2009   Hyperlipidemia 07/05/2009   Depression 07/05/2009   ALLERGIC RHINITIS 07/05/2009   OSTEOARTHRITIS 07/05/2009   HEADACHE 07/05/2009    Immunization History  Administered Date(s) Administered   Influenza Split 11/25/2005, 12/25/2006, 10/20/2007, 09/26/2008    Influenza,inj,Quad PF,6+ Mos 08/17/2014, 08/26/2015, 08/20/2016, 01/15/2018, 10/27/2018, 01/19/2020, 10/26/2020, 12/14/2021   Influenza-Unspecified 09/30/2013   PFIZER(Purple Top)SARS-COV-2 Vaccination 03/05/2020, 04/05/2020, 10/20/2020   Pfizer Covid-19 Vaccine Bivalent Booster 11yr & up 12/01/2021   Td 08/10/2010   Tdap 10/26/2020   Zoster Recombinat (Shingrix) 07/17/2021, 12/14/2021    Conditions to be addressed/monitored: HLD, Anxiety, Depression, and chronic pain  There are no care plans that you recently modified to display for this patient.    Medication Assistance: None required.  Patient affirms current coverage meets needs.  [id number] [place] [place] [facility] [address]Phone: [phone]: [phone] [address]Phone: [phone]: [phone]  Uses pill box? No - has a makeup bag & keeps things together in this way Pt endorses 100% compliance  Follow Up:  Patient agrees to Care Plan and Follow-up.  Plan: Telephone follow up appointment with care management team member scheduled for:  6 month  KLarinda Buttery PharmD Clinical Pharmacist CGold[facility] CenterPrimary [facility] At MCent[phone]

## 2022-01-05 NOTE — Patient Instructions (Addendum)
Visit Information  Thank you for taking time to visit with me today. Please don't hesitate to contact me if I can be of assistance to you before our next scheduled telephone appointment.  Following are the goals we discussed today:  Patient Goals/Self-Care Activities Over the next 180 days, patient will:  take medications as prescribed  Follow Up Plan: Telephone follow up appointment with care management team member scheduled for:  6 months   Please call the care guide team at [phone] if you need to cancel or reschedule your appointment.   If you are experiencing a Mental Health or [place] or need someone to talk to, please call the Suicide and Crisis Lifeline: 988 call 1-800-273-TALK (toll free, 24 hour hotline)   Patient verbalizes understanding of instructions provided today and agrees to view in [place].   Desiree Wilson

## 2022-01-18 ENCOUNTER — Encounter (INDEPENDENT_AMBULATORY_CARE_PROVIDER_SITE_OTHER): Payer: Self-pay | Admitting: Family Medicine

## 2022-01-22 ENCOUNTER — Ambulatory Visit (INDEPENDENT_AMBULATORY_CARE_PROVIDER_SITE_OTHER): Payer: Medicare Other | Admitting: Family Medicine

## 2022-01-25 ENCOUNTER — Other Ambulatory Visit: Payer: Self-pay | Admitting: Family Medicine

## 2022-01-30 DIAGNOSIS — F32A Depression, unspecified: Secondary | ICD-10-CM

## 2022-01-30 DIAGNOSIS — E785 Hyperlipidemia, unspecified: Secondary | ICD-10-CM

## 2022-02-09 ENCOUNTER — Other Ambulatory Visit: Payer: Self-pay

## 2022-02-09 DIAGNOSIS — E7849 Other hyperlipidemia: Secondary | ICD-10-CM

## 2022-02-09 MED ORDER — ROSUVASTATIN CALCIUM 20 MG PO TABS: 20.0000 mg | ORAL_TABLET | Freq: Every day | ORAL | 3 refills | Status: DC

## 2022-02-12 ENCOUNTER — Ambulatory Visit: Payer: Medicare Other | Admitting: Psychology

## 2022-02-12 DIAGNOSIS — F331 Major depressive disorder, recurrent, moderate: Secondary | ICD-10-CM | POA: Diagnosis not present

## 2022-02-12 NOTE — Progress Notes (Signed)
[facility] Counselor Initial Adult Exam  Name: Desiree Wilson Date: 02/12/2022 MRN: [id number] DOB: [id number] PCP: Hali Marry, MD  Time spent: 11:00am-11:55am    55 minutes  Guardian/Payee:  Crista Curb requested: No   Reason for Visit /Presenting Problem: Pt present for face-to-face initial assessment via video Webex.  Pt consents to telehealth video session due to COVID 19 pandemic. Location of pt: home Location of therapist: home office.  Pt has trigeminal neuralgia and suffers with chronic pain.   The pain is better than it was 12 years ago.  She was on many narcatics at that time but has been able to get off of them.   She now get ketamine infusions that help her manage her pain. Pt lives with chronic pain daily.  Her pain stays at a 6 or 7 on the pain scale.  A year ago pt started taking ketamine nose spray for pain. Pt has a lot of fears and anxiety about the weather and driving and going out to social situations.  Pt gets anxious about not being able to take care of herself.  Pt lives with a man she has been with for 10 years.  He is very supportive of pt and her issues.       Pt has been in therapy on and off since age 52 but feels there are still things she has not dealt with.  Pt has a Oceanographer in Marketing executive.   Pt states she has depression and anxiety and chronic pain.   Pt states she is happy with herself and likes her life but she wants to learn to embrace the good things.   She still has anger that she had to move back home in 2002 and go on disability and lose some independence.  Pt wants to mourn the loss of the life she had in [place].  Pt was successful in her career but had to stop working when her chronic pain issues started.  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  Pt has been medicated with anti depressants since then.  Pt has had to have reconstructive  surgeries.  Pt states she has learned a lot through her experience and is a good daughter and sister and aunt and friend.   Pt was able to go to college and get a masters degree in theater.   Pt has been very resilient.  She has a good support system of family and friends.     Mental Status Exam: Appearance:   Casual     Behavior:  Appropriate  Motor:  Normal  Speech/Language:   Normal Rate  Affect:  Appropriate  Mood:  normal  Thought process:  normal  Thought content:    WNL  Sensory/Perceptual disturbances:    WNL  Orientation:  oriented to person, place, time/date, and situation  Attention:  Good  Concentration:  Good  Memory:  WNL  Fund of knowledge:   Good  Insight:    Good  Judgment:   Good  Impulse Control:  Good    Reported Symptoms:  sadness, pain  Risk Assessment: Danger to Self:  No Self-injurious Behavior: No Danger to Others: No Duty to Warn:no Physical Aggression / Violence:No  Access to Firearms a concern: No  Gang Involvement:No  Patient / guardian was educated about steps to take if suicide or homicide risk level increases between  visits: n/a While future psychiatric events cannot be accurately predicted, the patient does not currently require acute inpatient psychiatric care and does not currently meet [facility] involuntary commitment criteria.  Substance Abuse History: Current substance abuse: No     Past Psychiatric History:   Previous psychological history is significant for anxiety and depression Outpatient Providers:several outpt therapists throughout the years.   History of Psych Hospitalization: No  Psychological Testing:  n/a    Abuse History:  Victim of: No.,  n/a    Report needed: No. Victim of Neglect:No. Perpetrator of  n/a   Witness / Exposure to Domestic Violence: No   Protective Services Involvement: No  Witness to [employer] Violence:  No   Family History:  Family History  Problem Relation Age of Onset   Breast cancer  Mother        lumpectomy and radiation   Obesity Mother    Stroke Father    Diverticulitis Father    Hypertension Father    Hyperlipidemia Father    Depression Father    Alzheimer's disease Father    Colon cancer Neg Hx     Living situation: the patient lives with their partner.  Pt has been with her partner [name] for 10 years.   Pt has a service dog named Bo.  Pt was raised with both parents.  She has a younger brother.  Pt's father worked as a Community education officer.  Pt's mother was a substance abuse Social worker.   They did well financially.  Family history of mental health issues:  pt's father has chronic depression.   No family history of substance abuse of child abuse.    Pt states she had a good childhood.   Pt's parents now live at [facility], a continuous level of care community.  Pt's father is in the early stages of alzheimers.  Pt's father is 59 and her mother is 57.   Sexual Orientation: Straight  Relationship Status: single  Name of spouse / other:n/a If a parent, number of children / ages:none  Support Systems: significant other friends  Financial Stress:  Yes   Income/Employment/Disability: Employment.  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  Pt works about 7-10 hours a week as a supplement to her disability income.   Military Service: No   Educational History: Education: post Forensic psychologist work or degree  Religion/Sprituality/World View: Agnostic  Any cultural differences that may affect / interfere with treatment:  not applicable   Recreation/Hobbies: pt likes to nap, spending time with her dog Bo.  Pt likes spending time with [name].  Pt has several friends that she facetimes with in the morning.  Pt tends to stay home a lot.    Stressors: Health problems    Strengths: Supportive Relationships, Hopefulness, Self Advocate, and Able to Communicate Effectively  Barriers:  none   Legal  History: Pending legal issue / charges: The patient has no significant history of legal issues. History of legal issue / charges:  n/a  Medical History/Surgical History: reviewed Past Medical History:  Diagnosis Date   Allergy    rhinitis   Anemia    Anxiety    Blood transfusion    x2 1983 surgery blood loss   Blood transfusion without reported diagnosis    Depression    Depression    Facial paralysis    right face/ chronic pain syndrome (pain management Dr Sydell Axon)   VOJJKKXF(818.2)  Hyperlipidemia    Hypotension    Iron deficiency anemia 11/15/2014   Iron malabsorption 11/15/2014   Osteoarthritis    Trigeminal neuralgia    right   Trigeminal neuralgia 2002    Past Surgical History:  Procedure Laterality Date   COLONOSCOPY     ESOPHAGOGASTRODUODENOSCOPY     gamma knife Right 01/2018   MANDIBLE SURGERY     NASAL SINUS SURGERY     OSTEOTOMY     plastic facial surgery     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     clonazePAM (KLONOPIN) 1 MG tablet Take 1 tablet (1 mg total) by mouth 2 (two) times daily. 30 tablet 2   colestipol (COLESTID) 1 g tablet Take 1 tablet (1 g total) by mouth 2 (two) times daily. 60 tablet 6   drospirenone-ethinyl estradiol (YAZ) 3-0.02 MG tablet Take 1 tablet by mouth daily. 84 tablet 3   DULoxetine (CYMBALTA) 30 MG capsule Take 1 capsule (30 mg total) by mouth daily. Appt for refills 30 capsule 0   ferrous fumarate (HEMOCYTE - 106 MG FE) 325 (106 Fe) MG TABS tablet Take 1 tablet by mouth every other day.     fluticasone (FLONASE) 50 MCG/ACT nasal spray Place 2 sprays into both nostrils daily. 16 g 6   frovatriptan (FROVA) 2.5 MG tablet Take 2.5 mg by mouth daily as  needed.     gabapentin (NEURONTIN) 600 MG tablet Take 600 mg by mouth 3 (three) times daily.     guaiFENesin (MUCINEX) 600 MG 12 hr tablet Take 2 tablets (1,200 mg total) by mouth 2 (two) times daily. 30 tablet 2   IBUPROFEN PO Take by mouth as needed.     ketamine (KETALAR) 10 MG/ML injection Inject into the vein every 3 (three) months.      loperamide (IMODIUM A-D) 2 MG tablet Take 1 tablet (2 mg total) by mouth as needed for diarrhea or loose stools. 30 tablet 0   Multiple Vitamin (MULTIVITAMIN) capsule Take 1 capsule by mouth daily.     omeprazole (PRILOSEC) 40 MG capsule TAKE ONE CAPSULE BY MOUTH DAILY 90 capsule 3   Oxcarbazepine (TRILEPTAL) 300 MG tablet Take 300 mg by mouth 2 (two) times daily.     Probiotic Product (PROBIOTIC PO) Take by mouth.     promethazine (PHENERGAN) 25 MG tablet Take 1 tablet (25 mg total) by mouth every 8 (eight) hours as needed for nausea or vomiting. 20 tablet 0   Pseudoephedrine-Acetaminophen (SINUS PO) Take by mouth as needed.     rosuvastatin (CRESTOR) 20 MG tablet Take 1 tablet (20 mg total) by mouth daily. 90 tablet 3   tiZANidine (ZANAFLEX) 4 MG tablet Take 4 mg by mouth every 6 (six) hours as needed.     traZODone (DESYREL) 100 MG tablet TAKE 2 TABLETS(200 MG) BY MOUTH AT BEDTIME AS NEEDED FOR SLEEP 180 tablet 1   vitamin B-12 (CYANOCOBALAMIN) 1000 MCG tablet Take 1,000 mcg by mouth daily.     vitamin C (ASCORBIC ACID) 500 MG tablet Take 500 mg by mouth daily.     Vitamin D, Ergocalciferol, (DRISDOL) 1.25 MG (50000 UNIT) CAPS capsule Take 1 capsule (50,000 Units total) by mouth every  7 (seven) days. 12 capsule 0   No current facility-administered medications for this visit.    Allergies  Allergen Reactions   Azithromycin Rash    REACTION: C Diff Other reaction(s): Other (See Comments), Other (See Comments) REACTION: C Diff REACTION: C Diff REACTION: C Diff    Methadone     Other reaction(s): Hallucinations, Other (See Comments), Other (See  Comments) Hallucinations Hallucinations Hallucinations    Tapentadol Anxiety    Other reaction(s): Dizziness   Phenobarbital     REACTION: Excited   Wellbutrin [Bupropion] Other (See Comments)    Not effective.     Diagnoses: F33.1  Plan of Care: Recommend ongoing therapy.   Pt participated with setting treatment goals.   Pt states her goals for therapy are to have a place to process her feelings.   Pt wants to improve coping skills.   Plan to meet every two weeks.    Treatment Plan (Treatment Plan Target Date: 02/12/2023) Client Abilities/Strengths  Pt is bright, engaging, and motivated for therapy.  Client Treatment Preferences  Individual therapy.  Client Statement of Needs  Improve copings skills and have a safe place to talk about feelings.   Symptoms  Depressed or irritable mood. Excessive and/or unrealistic worry that is difficult to control occurring more days than not for at least 6 months about a number of events or activities. Hypervigilance (e.g., feeling constantly on edge, experiencing concentration difficulties, having trouble falling or staying asleep, exhibiting a general state of irritability).  Problems Addressed  Unipolar Depression, Anxiety Goals 1. Alleviate depressive symptoms and return to previous level of effective functioning. 2. Appropriately grieve the loss in order to normalize mood and to return to previously adaptive level of functioning. Objective Learn and implement behavioral strategies to overcome depression. Target Date: 2023-02-12 Frequency: Biweekly  Progress: 10 Modality: individual  Related Interventions Assist the client in developing skills that increase the likelihood of deriving pleasure from behavioral activation (e.g., assertiveness skills, developing an exercise plan, less internal/more external focus, increased social involvement); reinforce success. Engage the client in "behavioral activation," increasing his/her activity level  and contact with sources of reward, while identifying processes that inhibit activation. use behavioral techniques such as instruction, rehearsal, role-playing, role reversal, as needed, to facilitate activity in the client's daily life; reinforce success. 3. Develop healthy interpersonal relationships that lead to the alleviation and help prevent the relapse of depression. 4. Develop healthy thinking patterns and beliefs about self, others, and the world that lead to the alleviation and help prevent the relapse of depression. 5. Enhance ability to effectively cope with the full variety of life's worries and anxieties. 6. Learn and implement coping skills that result in a reduction of anxiety and worry, and improved daily functioning. Objective Learn and implement problem-solving strategies for realistically addressing worries. Target Date: 2023-02-12 Frequency: Biweekly  Progress: 10 Modality: individual  Related Interventions Assign the client a homework exercise in which he/she problem-solves a current problem (see Mastery of Your Anxiety and Worry: Workbook by Adora Fridge and Eliot Ford or Generalized Anxiety Disorder by Eather Colas, and Eliot Ford); review, reinforce success, and provide corrective feedback toward improvement. Teach the client problem-solving strategies involving specifically defining a problem, generating options for addressing it, evaluating the pros and cons of each option, selecting and implementing an optional action, and reevaluating and refining the action. Objective Learn and implement calming skills to reduce overall anxiety and manage anxiety symptoms. Target Date: 2023-02-12 Frequency: Biweekly  Progress: 10 Modality: individual  Related Interventions Assign  the client to read about progressive muscle relaxation and other calming strategies in relevant books or treatment manuals (e.g., Progressive Relaxation Training by Gwynneth Aliment and Dani Gobble; Mastery of Your Anxiety and  Worry: Workbook by Beckie Busing). Assign the client homework each session in which he/she practices relaxation exercises daily, gradually applying them progressively from non-anxiety-provoking to anxiety-provoking situations; review and reinforce success while providing corrective feedback toward improvement. Teach the client calming/relaxation skills (e.g., applied relaxation, progressive muscle relaxation, cue controlled relaxation; mindful breathing; biofeedback) and how to discriminate better between relaxation and tension; teach the client how to apply these skills to his/her daily life. 7. Recognize, accept, and cope with feelings of depression. 8. Reduce overall frequency, intensity, and duration of the anxiety so that daily functioning is not impaired. 9. Resolve the core conflict that is the source of anxiety. 10. Stabilize anxiety level while increasing ability to function on a daily basis. Diagnosis Axis none F33.1 Major Depressive Disorder  Axis none F41.1 General Anxiety Disorder  Conditions For Discharge Achievement of treatment goals and objectives    Clint Bolder, LCSW

## 2022-02-14 ENCOUNTER — Other Ambulatory Visit: Payer: Self-pay

## 2022-02-14 ENCOUNTER — Encounter: Payer: Self-pay | Admitting: Family Medicine

## 2022-02-14 ENCOUNTER — Ambulatory Visit (INDEPENDENT_AMBULATORY_CARE_PROVIDER_SITE_OTHER): Payer: Medicare Other | Admitting: Family Medicine

## 2022-02-14 ENCOUNTER — Other Ambulatory Visit: Payer: Self-pay | Admitting: Family Medicine

## 2022-02-14 VITALS — BP 128/87 | HR 102 | Resp 18 | Ht 65.0 in | Wt 202.0 lb

## 2022-02-14 DIAGNOSIS — Z1231 Encounter for screening mammogram for malignant neoplasm of breast: Secondary | ICD-10-CM

## 2022-02-14 DIAGNOSIS — F325 Major depressive disorder, single episode, in full remission: Secondary | ICD-10-CM

## 2022-02-14 DIAGNOSIS — R238 Other skin changes: Secondary | ICD-10-CM | POA: Diagnosis not present

## 2022-02-14 DIAGNOSIS — L0291 Cutaneous abscess, unspecified: Secondary | ICD-10-CM

## 2022-02-14 DIAGNOSIS — R6882 Decreased libido: Secondary | ICD-10-CM | POA: Insufficient documentation

## 2022-02-14 DIAGNOSIS — N952 Postmenopausal atrophic vaginitis: Secondary | ICD-10-CM | POA: Insufficient documentation

## 2022-02-14 MED ORDER — BETAMETHASONE VALERATE 0.12 % EX FOAM: 1.0000 "application " | Freq: Two times a day (BID) | CUTANEOUS | 1 refills | Status: DC

## 2022-02-14 MED ORDER — ESTRADIOL 0.1 MG/GM VA CREA: 1.0000 | TOPICAL_CREAM | Freq: Every day | VAGINAL | 3 refills | Status: DC

## 2022-02-14 MED ORDER — DULOXETINE HCL 60 MG PO CPEP: 60.0000 mg | ORAL_CAPSULE | Freq: Every day | ORAL | 0 refills | Status: DC

## 2022-02-14 MED ORDER — DOXYCYCLINE HYCLATE 100 MG PO TABS: 100.0000 mg | ORAL_TABLET | Freq: Two times a day (BID) | ORAL | 0 refills | Status: DC

## 2022-02-14 NOTE — Progress Notes (Signed)
Established Patient Office Visit  Subjective:  Patient ID: Desiree Wilson, female    DOB: 08-31-1966  Age: 56 y.o. MRN: [id number]  CC:  Chief Complaint  Patient presents with   Depression    Discuss increasing Cymbalta.    scalp concern    Tender/painful, 1 month    Rash    Right side of abdomen, 3 months    Low Libido    Several years     HPI Desiree Wilson presents for   Follow-up depression-overall she really likes the Cymbalta she says since being on it she has noticed a big difference.  But she would like to consider going up on the dose if at all possible.  She just feels like she has a lot going on with right now.  Her father's health is failing.  And she is trying to also with her mother's increased anxiety.  Her brother has not been supportive.  She did start therapy last Monday.  She wants to discuss her scalp.  She has an old incision on the right side of her scalp from facial reconstruction and says for at least the last month it just feels irritated and itchy to the point that she now feels some itching down to her forehead.  It is now starting to get painful and sore she has not noticed any scalp issues such as redness dryness or flaking.  She has been using head and shoulders and applying lavender oil and tea tree oil and it just does not seem to be getting any better.  She also has a skin lesion on her right abdomen that is been there for a couple of months.  She says it drains a little bit almost every day.  She says is not as swollen as it was but it is also just not healing.  Wants to discuss low libido that is been an issue for several years.  She has a commitment ceremony coming up in the fall and wants to discuss what she could do.  She is currently on Yaz but really has some significant vaginal dryness and irritation.  Past Medical History:  Diagnosis Date   Allergy    rhinitis   Anemia    Anxiety    Blood transfusion    x2 1983 surgery blood loss    Blood transfusion without reported diagnosis    Depression    Depression    Facial paralysis    right face/ chronic pain syndrome (pain management Dr Sydell Axon)   ZJQBHALP(379.0)    Hyperlipidemia    Hypotension    Iron deficiency anemia 11/15/2014   Iron malabsorption 11/15/2014   Osteoarthritis    Trigeminal neuralgia    right   Trigeminal neuralgia 2002    Past Surgical History:  Procedure Laterality Date   COLONOSCOPY     ESOPHAGOGASTRODUODENOSCOPY     gamma knife Right 01/2018   MANDIBLE SURGERY     NASAL SINUS SURGERY     OSTEOTOMY     plastic facial surgery     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    Family History  Problem Relation Age of Onset   Breast cancer Mother        lumpectomy and radiation   Obesity Mother    Stroke Father    Diverticulitis Father    Hypertension Father    Hyperlipidemia Father    Depression Father    Alzheimer's disease Father  Colon cancer Neg Hx     Social History   Socioeconomic History   Marital status: Significant Other    Spouse name: Desiree Wilson   Number of children: 0   Years of education: Masters Degree   Highest education level: Master's degree (e.g., MA, MS, MEng, MEd, MSW, MBA)  Occupational History   Occupation: disabled/self employed  Tobacco Use   Smoking status: Some Days    Years: 6.00    Types: Cigarettes    Last attempt to quit: 12/31/2000    Years since quitting: 21.1   Smokeless tobacco: Never   Tobacco comments:    Social smoker   Vaping Use   Vaping Use: Former   Start date: 06/30/2018   Substances: Nicotine  Substance and Sexual Activity   Alcohol use: No    Alcohol/week: 0.0 standard drinks   Drug use: No   Sexual activity: Yes    Partners: Male  Other Topics Concern   Not on file  Social History Narrative   Wants to start back exercising. Goal is to loose 20 to 40 lbs in the next year. Coffee in the morning and tea during the day   Social Determinants of  Health   Financial Resource Strain: Not on file  Food Insecurity: Not on file  Transportation Needs: Not on file  Physical Activity: Not on file  Stress: Not on file  Social Connections: Not on file  Intimate Partner Violence: Not on file    Outpatient Medications Prior to Visit  Medication Sig Dispense Refill   AMBULATORY NON FORMULARY MEDICATION Ketamine nose spray: use 2 sprays prn pain, up to 10 sprays a day     Carboxymethylcellulose Sodium 1 % GEL Apply to eye as needed.     clonazePAM (KLONOPIN) 1 MG tablet Take 1 tablet (1 mg total) by mouth 2 (two) times daily. 30 tablet 2   colestipol (COLESTID) 1 g tablet Take 1 tablet (1 g total) by mouth 2 (two) times daily. 60 tablet 6   ferrous fumarate (HEMOCYTE - 106 MG FE) 325 (106 Fe) MG TABS tablet Take 1 tablet by mouth every other day.     fluticasone (FLONASE) 50 MCG/ACT nasal spray Place 2 sprays into both nostrils daily. 16 g 6   frovatriptan (FROVA) 2.5 MG tablet Take 2.5 mg by mouth daily as needed.     gabapentin (NEURONTIN) 600 MG tablet Take 600 mg by mouth 3 (three) times daily.     guaiFENesin (MUCINEX) 600 MG 12 hr tablet Take 2 tablets (1,200 mg total) by mouth 2 (two) times daily. 30 tablet 2   IBUPROFEN PO Take by mouth as needed.     ketamine (KETALAR) 10 MG/ML injection Inject into the vein every 3 (three) months.      loperamide (IMODIUM A-D) 2 MG tablet Take 1 tablet (2 mg total) by mouth as needed for diarrhea or loose stools. 30 tablet 0   Multiple Vitamin (MULTIVITAMIN) capsule Take 1 capsule by mouth daily.     omeprazole (PRILOSEC) 40 MG capsule TAKE ONE CAPSULE BY MOUTH DAILY 90 capsule 3   Oxcarbazepine (TRILEPTAL) 300 MG tablet Take 300 mg by mouth 2 (two) times daily.     Probiotic Product (PROBIOTIC PO) Take by mouth.     promethazine (PHENERGAN) 25 MG tablet Take 1 tablet (25 mg total) by mouth every 8 (eight) hours as needed for nausea or vomiting. 20 tablet 0   Pseudoephedrine-Acetaminophen (SINUS PO)  Take by mouth as needed.  rosuvastatin (CRESTOR) 20 MG tablet Take 1 tablet (20 mg total) by mouth daily. 90 tablet 3   tiZANidine (ZANAFLEX) 4 MG tablet Take 4 mg by mouth every 6 (six) hours as needed.     traZODone (DESYREL) 100 MG tablet TAKE 2 TABLETS(200 MG) BY MOUTH AT BEDTIME AS NEEDED FOR SLEEP 180 tablet 1   vitamin B-12 (CYANOCOBALAMIN) 1000 MCG tablet Take 1,000 mcg by mouth daily.     vitamin C (ASCORBIC ACID) 500 MG tablet Take 500 mg by mouth daily.     Vitamin D, Ergocalciferol, (DRISDOL) 1.25 MG (50000 UNIT) CAPS capsule Take 1 capsule (50,000 Units total) by mouth every 7 (seven) days. 12 capsule 0   drospirenone-ethinyl estradiol (YAZ) 3-0.02 MG tablet Take 1 tablet by mouth daily. 84 tablet 3   DULoxetine (CYMBALTA) 30 MG capsule Take 1 capsule (30 mg total) by mouth daily. Appt for refills 30 capsule 0   No facility-administered medications prior to visit.    Allergies  Allergen Reactions   Azithromycin Rash    REACTION: C Diff Other reaction(s): Other (See Comments), Other (See Comments) REACTION: C Diff REACTION: C Diff REACTION: C Diff    Methadone     Other reaction(s): Hallucinations, Other (See Comments), Other (See Comments) Hallucinations Hallucinations Hallucinations    Tapentadol Anxiety    Other reaction(s): Dizziness   Phenobarbital     REACTION: Excited   Wellbutrin [Bupropion] Other (See Comments)    Not effective.     ROS Review of Systems    Objective:    Physical Exam Constitutional:      Appearance: Normal appearance. She is well-developed.  HENT:     Head: Normocephalic and atraumatic.  Cardiovascular:     Rate and Rhythm: Normal rate and regular rhythm.     Heart sounds: Normal heart sounds.  Pulmonary:     Effort: Pulmonary effort is normal.     Breath sounds: Normal breath sounds.  Skin:    General: Skin is warm and dry.     Comments: Scalp is cleared no rash or flaking.  She does have a few excoriations and scabbing  from scratching.  No abnormal lesions on the right forehead.  On her right lower abdomen she has an approximately 2 cm erythematous papule.  No active drainage no open wound today.  Neurological:     Mental Status: She is alert and oriented to person, place, and time.  Psychiatric:        Behavior: Behavior normal.    BP 128/87    Pulse (!) 102    Resp 18    Ht _0  (1.651 m)    Wt 202 lb (91.6 kg)    SpO2 97%    BMI 33.61 kg/m  Wt Readings from Last 3 Encounters:  02/14/22 202 lb (91.6 kg)  12/14/21 211 lb (95.7 kg)  12/07/21 206 lb (93.4 kg)     Health Maintenance Due  Topic Date Due   HIV Screening  Never done   Hepatitis C Screening  Never done    There are no preventive care reminders to display for this patient.  Lab Results  Component Value Date   TSH 0.933 05/11/2021   Lab Results  Component Value Date   WBC 11.4 (H) 09/27/2021   HGB 13.6 09/27/2021   HCT 41.5 09/27/2021   MCV 80.4 09/27/2021   PLT 191.0 09/27/2021   Lab Results  Component Value Date   NA 137 09/27/2021   K 4.3  09/27/2021   CO2 25 09/27/2021   GLUCOSE 106 (H) 09/27/2021   BUN 15 09/27/2021   CREATININE 1.09 09/27/2021   BILITOT 0.3 09/27/2021   ALKPHOS 86 09/27/2021   AST 12 09/27/2021   ALT 9 09/27/2021   PROT 7.7 09/27/2021   ALBUMIN 4.0 09/27/2021   CALCIUM 9.3 09/27/2021   EGFR 62 07/28/2021   GFR 57.38 (L) 09/27/2021   Lab Results  Component Value Date   CHOL 203 (H) 03/02/2021   Lab Results  Component Value Date   HDL 76 03/02/2021   Lab Results  Component Value Date   LDLCALC 93 03/02/2021   Lab Results  Component Value Date   TRIG 245 (H) 03/02/2021   Lab Results  Component Value Date   CHOLHDL 2.7 03/02/2021   Lab Results  Component Value Date   HGBA1C 5.8 (H) 05/11/2021      Assessment & Plan:   Problem List Items Addressed This Visit       Genitourinary   Vaginal atrophy    She can go ahead and finish out her current pill pack on the Yaz and  then when she runs out and I have her stop it work on a switch to vaginal estrogen.  Prescription sent to pharmacy she can call me if she has any problems with drug coverage with her insurance plan to use it nightly for a week and then 3 times a week for maintenance.      Relevant Medications   estradiol (ESTRACE) 0.1 MG/GM vaginal cream     Other   Low libido    Discussed t options and also discussed that some her medications actually may be affecting her libido.  Initially had like to work on the vaginal atrophy and see if that is helpful and if it does not then we can consider alternatives.  Or maybe even adding something to it.      Depression    Can certainly increase the Cymbalta to 60 mg.  New prescription sent to pharmacy really excited that she is connected with a therapist.  I think this can be really helpful long-term she definitely has a lot of acute stressors on her and helping take care of her dad and her mother.      Relevant Medications   DULoxetine (CYMBALTA) 60 MG capsule   Other Visit Diagnoses     Scalp irritation    -  Primary   Relevant Medications   Betamethasone Valerate 0.12 % foam   Abscess       Relevant Medications   doxycycline (VIBRA-TABS) 100 MG tablet      Scalp irritation-I do not see any rash etc.  There is a few excoriations from scratching.  We discussed maybe using a topical steroid to reduce the itch scratch cycle and see if that is helpful.  Did warn about avoiding prolonged use of topical steroids.  She has an abscess on her right lower abdomen I suspect it probably started as an infected hair follicle and it is better than it was but it just does not seem to be healing so we can go ahead and treat with a 7-day course of doxycycline.  Call if not better in 1 week.  Meds ordered this encounter  Medications   Betamethasone Valerate 0.12 % foam    Sig: Apply 1 application topically 2 (two) times daily. To scalp area    Dispense:  100 g     Refill:  1  DULoxetine (CYMBALTA) 60 MG capsule    Sig: Take 1 capsule (60 mg total) by mouth daily. Appt for refills    Dispense:  90 capsule    Refill:  0    Pt will call when needed.   estradiol (ESTRACE) 0.1 MG/GM vaginal cream    Sig: Place 1 Applicatorful vaginally at bedtime. X 7 days, then 3 x a week    Dispense:  85 g    Refill:  3   doxycycline (VIBRA-TABS) 100 MG tablet    Sig: Take 1 tablet (100 mg total) by mouth 2 (two) times daily.    Dispense:  14 tablet    Refill:  0    Follow-up: Return in about 3 months (around 05/14/2022) for New start medication, Inc cymbalta.    I spent 35 minutes on the day of the encounter to include pre-visit record review, face-to-face time with the patient and post visit ordering of test.   [name], MD

## 2022-02-14 NOTE — Patient Instructions (Signed)
Once you run out of the Leon please stop it.

## 2022-02-14 NOTE — Assessment & Plan Note (Signed)
Can certainly increase the Cymbalta to 60 mg.  New prescription sent to pharmacy really excited that she is connected with a therapist.  I think this can be really helpful long-term she definitely has a lot of acute stressors on her and helping take care of her dad and her mother.

## 2022-02-14 NOTE — Assessment & Plan Note (Signed)
Discussed t options and also discussed that some her medications actually may be affecting her libido.  Initially had like to work on the vaginal atrophy and see if that is helpful and if it does not then we can consider alternatives.  Or maybe even adding something to it.

## 2022-02-14 NOTE — Assessment & Plan Note (Signed)
She can go ahead and finish out her current pill pack on the Yaz and then when she runs out and I have her stop it work on a switch to vaginal estrogen.  Prescription sent to pharmacy she can call me if she has any problems with drug coverage with her insurance plan to use it nightly for a week and then 3 times a week for maintenance.

## 2022-02-15 ENCOUNTER — Ambulatory Visit (INDEPENDENT_AMBULATORY_CARE_PROVIDER_SITE_OTHER): Payer: Medicare Other

## 2022-02-15 ENCOUNTER — Encounter: Payer: Self-pay | Admitting: Family Medicine

## 2022-02-15 DIAGNOSIS — R238 Other skin changes: Secondary | ICD-10-CM

## 2022-02-15 DIAGNOSIS — Z1231 Encounter for screening mammogram for malignant neoplasm of breast: Secondary | ICD-10-CM | POA: Diagnosis not present

## 2022-02-15 MED ORDER — FLUOCINOLONE ACETONIDE SCALP 0.01 % EX OIL: 1.0000 "application " | TOPICAL_OIL | Freq: Every evening | CUTANEOUS | 1 refills | Status: DC | PRN

## 2022-02-15 NOTE — Telephone Encounter (Signed)
Meds ordered this encounter  Medications   Fluocinolone Acetonide Scalp 0.01 % OIL    Sig: Apply 1 application topically at bedtime as needed.    Dispense:  119 mL    Refill:  1

## 2022-02-15 NOTE — Progress Notes (Signed)
Please call patient. Normal mammogram.  Repeat in 1 year.  

## 2022-02-20 ENCOUNTER — Encounter: Payer: Self-pay | Admitting: Family Medicine

## 2022-02-21 MED ORDER — FLUCONAZOLE 150 MG PO TABS: 150.0000 mg | ORAL_TABLET | Freq: Once | ORAL | 0 refills | Status: AC

## 2022-02-22 ENCOUNTER — Ambulatory Visit: Payer: Medicare Other | Admitting: Psychology

## 2022-02-22 DIAGNOSIS — F411 Generalized anxiety disorder: Secondary | ICD-10-CM

## 2022-02-22 DIAGNOSIS — F331 Major depressive disorder, recurrent, moderate: Secondary | ICD-10-CM

## 2022-02-22 NOTE — Progress Notes (Signed)
[facility] Counselor/Therapist Progress Note  Patient ID: Desiree Wilson, MRN: [id number],    Date: 02/22/2022  Time Spent: 11:00am-11:55am   55 minutes   Treatment Type: Individual Therapy  Reported Symptoms: anxiety, pain  Mental Status Exam: Appearance:  Casual and Neat     Behavior: Appropriate  Motor: Normal  Speech/Language:  Normal Rate  Affect: Appropriate  Mood: normal  Thought process: normal  Thought content:   WNL  Sensory/Perceptual disturbances:   WNL  Orientation: oriented to person, place, time/date, and situation  Attention: Good  Concentration: Good  Memory: WNL  Fund of knowledge:  Good  Insight:   Good  Judgment:  Good  Impulse Control: Good   Risk Assessment: Danger to Self:  No Self-injurious Behavior: No Danger to Others: No Duty to Warn:no Physical Aggression / Violence:No  Access to Firearms a concern: No  Gang Involvement:No   Subjective: Pt present for face-to-face individual therapy.   Pt talked about having more pain yesterday and today.   She feels defeated when her pain increases.  Addressed pt's issues with pain and worked on pain management strategies.   Pt talked about her relationship with her mother.  She states her mother has always been a go getting and very involved in the community.  Pt states her mother has always been her "rock".   Pt admires her mother but is "afraid" of her too.  Pt does not want to be a disappointment to her mother.  Pt was very independent and successful until she got sick in 2002.  At that time she moved back in with her parents and has been financially dependent on them.   Pt states her mother is controlling about money and it makes pt feel like a little girl.   Pt states her mother wants her to have a commitment ceremony with her boyfriend [name].  Pt planned a ceremony and told her mother and she was not excited about it.  Pt's mother did not like that pt wanted to have the ceremony in  [place] and suggested she have a small ceremony in [employer] that family can attend.   Pt feels very hurt that her mother is not very interested in making plans.   Pt thinks one stressor that is impacting things is that her father has alzheimers.    Addressed how pt can have a conversation with her mother about the current dynamics.    Pt talked about complex family dynamics.   She was left out of her grandmother's trust and the money went to her brother.  Pt got her grandmother's house but really needed the money for medical bills.  Helped pt process her feelings and family dynamics.   Worked on self care strategies. Provided supportive therapy.    Interventions: Cognitive Behavioral Therapy and Insight-Oriented  Diagnosis: F33.1 and F41.1   Plan of Care: Recommend ongoing therapy.   Pt participated with setting treatment goals.   Pt states her goals for therapy are to have a place to process her feelings.   Pt wants to improve coping skills.   Plan to continue to meet every two weeks.  Pt is progressing toward treatment goals.    Treatment Plan (Treatment Plan Target Date: 02/12/2023) Client Abilities/Strengths  Pt is bright, engaging, and motivated for therapy.  Client Treatment Preferences  Individual therapy.  Client Statement of Needs  Improve copings skills and have a safe place to talk about feelings.   Symptoms  Depressed or irritable  mood. Excessive and/or unrealistic worry that is difficult to control occurring more days than not for at least 6 months about a number of events or activities. Hypervigilance (e.g., feeling constantly on edge, experiencing concentration difficulties, having trouble falling or staying asleep, exhibiting a general state of irritability).  Problems Addressed  Unipolar Depression, Anxiety Goals 1. Alleviate depressive symptoms and return to previous level of effective functioning. 2. Appropriately grieve the loss in order to normalize mood and to return to  previously adaptive level of functioning. Objective Learn and implement behavioral strategies to overcome depression. Target Date: 2023-02-12 Frequency: Biweekly  Progress: 10 Modality: individual  Related Interventions Assist the client in developing skills that increase the likelihood of deriving pleasure from behavioral activation (e.g., assertiveness skills, developing an exercise plan, less internal/more external focus, increased social involvement); reinforce success. Engage the client in "behavioral activation," increasing his/her activity level and contact with sources of reward, while identifying processes that inhibit activation. use behavioral techniques such as instruction, rehearsal, role-playing, role reversal, as needed, to facilitate activity in the client's daily life; reinforce success. 3. Develop healthy interpersonal relationships that lead to the alleviation and help prevent the relapse of depression. 4. Develop healthy thinking patterns and beliefs about self, others, and the world that lead to the alleviation and help prevent the relapse of depression. 5. Enhance ability to effectively cope with the full variety of life's worries and anxieties. 6. Learn and implement coping skills that result in a reduction of anxiety and worry, and improved daily functioning. Objective Learn and implement problem-solving strategies for realistically addressing worries. Target Date: 2023-02-12 Frequency: Biweekly  Progress: 10 Modality: individual  Related Interventions Assign the client a homework exercise in which he/she problem-solves a current problem (see Mastery of Your Anxiety and Worry: Workbook by Adora Fridge and Eliot Ford or Generalized Anxiety Disorder by Eather Colas, and Eliot Ford); review, reinforce success, and provide corrective feedback toward improvement. Teach the client problem-solving strategies involving specifically defining a problem, generating options for addressing it,  evaluating the pros and cons of each option, selecting and implementing an optional action, and reevaluating and refining the action. Objective Learn and implement calming skills to reduce overall anxiety and manage anxiety symptoms. Target Date: 2023-02-12 Frequency: Biweekly  Progress: 10 Modality: individual  Related Interventions Assign the client to read about progressive muscle relaxation and other calming strategies in relevant books or treatment manuals (e.g., Progressive Relaxation Training by Leroy Kennedy; Mastery of Your Anxiety and Worry: Workbook by Beckie Busing). Assign the client homework each session in which he/she practices relaxation exercises daily, gradually applying them progressively from non-anxiety-provoking to anxiety-provoking situations; review and reinforce success while providing corrective feedback toward improvement. Teach the client calming/relaxation skills (e.g., applied relaxation, progressive muscle relaxation, cue controlled relaxation; mindful breathing; biofeedback) and how to discriminate better between relaxation and tension; teach the client how to apply these skills to his/her daily life. 7. Recognize, accept, and cope with feelings of depression. 8. Reduce overall frequency, intensity, and duration of the anxiety so that daily functioning is not impaired. 9. Resolve the core conflict that is the source of anxiety. 10. Stabilize anxiety level while increasing ability to function on a daily basis. Diagnosis Axis none F33.1 Major Depressive Disorder  Axis none F41.1 General Anxiety Disorder  Conditions For Discharge Achievement of treatment goals and objectives   Clint Bolder, LCSW

## 2022-02-23 ENCOUNTER — Other Ambulatory Visit: Payer: Self-pay | Admitting: Family Medicine

## 2022-02-23 DIAGNOSIS — F5101 Primary insomnia: Secondary | ICD-10-CM

## 2022-02-26 DIAGNOSIS — G894 Chronic pain syndrome: Secondary | ICD-10-CM | POA: Diagnosis not present

## 2022-02-26 DIAGNOSIS — G500 Trigeminal neuralgia: Secondary | ICD-10-CM | POA: Diagnosis not present

## 2022-02-26 DIAGNOSIS — G501 Atypical facial pain: Secondary | ICD-10-CM | POA: Diagnosis not present

## 2022-02-26 DIAGNOSIS — F419 Anxiety disorder, unspecified: Secondary | ICD-10-CM | POA: Diagnosis not present

## 2022-03-02 ENCOUNTER — Ambulatory Visit: Payer: Medicare Other | Admitting: Gastroenterology

## 2022-03-02 ENCOUNTER — Encounter: Payer: Self-pay | Admitting: Gastroenterology

## 2022-03-02 ENCOUNTER — Other Ambulatory Visit (INDEPENDENT_AMBULATORY_CARE_PROVIDER_SITE_OTHER): Payer: Medicare Other

## 2022-03-02 VITALS — BP 144/92 | HR 90 | Ht 65.0 in | Wt 204.0 lb

## 2022-03-02 DIAGNOSIS — K219 Gastro-esophageal reflux disease without esophagitis: Secondary | ICD-10-CM

## 2022-03-02 DIAGNOSIS — D509 Iron deficiency anemia, unspecified: Secondary | ICD-10-CM

## 2022-03-02 DIAGNOSIS — E538 Deficiency of other specified B group vitamins: Secondary | ICD-10-CM

## 2022-03-02 DIAGNOSIS — K529 Noninfective gastroenteritis and colitis, unspecified: Secondary | ICD-10-CM | POA: Diagnosis not present

## 2022-03-02 DIAGNOSIS — Z86010 Personal history of colonic polyps: Secondary | ICD-10-CM

## 2022-03-02 LAB — CBC WITH DIFFERENTIAL/PLATELET
Basophils Absolute: 0 10*3/uL (ref 0.0–0.1)
Basophils Relative: 0.4 % (ref 0.0–3.0)
Eosinophils Absolute: 0.1 10*3/uL (ref 0.0–0.7)
Eosinophils Relative: 1 % (ref 0.0–5.0)
HCT: 41.7 % (ref 36.0–46.0)
Hemoglobin: 14 g/dL (ref 12.0–15.0)
Lymphocytes Relative: 19.9 % (ref 12.0–46.0)
Lymphs Abs: 1.9 10*3/uL (ref 0.7–4.0)
MCHC: 33.6 g/dL (ref 30.0–36.0)
MCV: 80.6 fl (ref 78.0–100.0)
Monocytes Absolute: 0.7 10*3/uL (ref 0.1–1.0)
Monocytes Relative: 7.2 % (ref 3.0–12.0)
Neutro Abs: 6.9 10*3/uL (ref 1.4–7.7)
Neutrophils Relative %: 71.5 % (ref 43.0–77.0)
Platelets: 160 10*3/uL (ref 150.0–400.0)
RBC: 5.17 Mil/uL — ABNORMAL HIGH (ref 3.87–5.11)
RDW: 14.5 % (ref 11.5–15.5)
WBC: 9.6 10*3/uL (ref 4.0–10.5)

## 2022-03-02 LAB — IBC + FERRITIN
Ferritin: 23.8 ng/mL (ref 10.0–291.0)
Iron: 47 ug/dL (ref 42–145)
Saturation Ratios: 11.7 % — ABNORMAL LOW (ref 20.0–50.0)
TIBC: 400.4 ug/dL (ref 250.0–450.0)
Transferrin: 286 mg/dL (ref 212.0–360.0)

## 2022-03-02 LAB — VITAMIN B12: Vitamin B-12: 685 pg/mL (ref 211–911)

## 2022-03-02 MED ORDER — LOPERAMIDE HCL 2 MG PO TABS: 2.0000 mg | ORAL_TABLET | Freq: Every morning | ORAL | 0 refills | Status: DC

## 2022-03-02 MED ORDER — COLESTIPOL HCL 1 G PO TABS: 1.0000 g | ORAL_TABLET | Freq: Two times a day (BID) | ORAL | 3 refills | Status: DC

## 2022-03-02 MED ORDER — DICYCLOMINE HCL 10 MG PO CAPS
10.0000 mg | ORAL_CAPSULE | Freq: Three times a day (TID) | ORAL | 3 refills | Status: DC | PRN
Start: 2022-03-02 — End: 2023-03-15

## 2022-03-02 NOTE — Progress Notes (Signed)
HPI :  56 year old female here for a follow-up visit for chronic diarrhea, GERD, B12 deficiency, iron deficiency.   Recall the patient has been dealing with ongoing loose stools with urgency for several months now, since this past summer.  See prior clinic notes for details of her case.  She has tested negative for celiac disease, had a negative infectious work-up, had an EGD and colonoscopy with me in October 2022, results as below.  Tested negative for microscopic colitis and celiac as well as H. pylori.  No clear cause for her vitamin deficiencies.  At her last visit in November we discussed trying a low FODMAP diet, discussed using Imodium as needed and start her on Colestid 1 g twice daily.  Her anemia resolved on iron supplementation.  We discussed capsule cystoscopy but decided to hold off on that and await her course.  On Colestid she states she is doing much better, upwards of 60% improved.  Her bowel movement frequency has decreased anywhere to 1-4 times per day, stools are soft formed, not as loose.  She has occasional urgency and not nearly as bad.  No nocturnal symptoms.  Generally pretty happy with her response but not yet resolved.  She takes an Imodium 1 to 2 pills perhaps 2 days a week.  She is seeing a weight loss specialist and trying to modify her diet to lose weight but at the same time hard to do that on a low FODMAP diet as well in regards to eating fruits such as apples etc.  She has continued to take her iron and B12 supplement.  She is taking her omeprazole for reflux and it is working pretty well for her.  She does have decreased energy at baseline.  She has not had a menstrual cycle in the past 10 years.  She is on chronic ketamine nasal spray and infusions for pain.  She was recently placed on doxycycline, was previously on Zoloft which was held.  Generally doing better but we discussed her options moving forward in regards to how aggressive she wants to be treated for her  symptoms as she is on other medications, and in regards to other work-up for her history of vitamin deficiencies.   Colonoscopy 12/07/2016: - Non-thrombosed external hemorrhoids found on perianal exam. - One 3 mm polyp at the ileocecal valve, removed with a cold biopsy forceps. Resected and retrieved. - One 6 mm polyp at the hepatic flexure, removed with a cold snare. Resected and retrieved. - One 5 mm polyp in the descending colon, removed with a cold snare. Resected and retrieved. - Internal hemorrhoids. - 5 year colonoscopy recall - The examination was otherwise normal. No inflammatory changes - Retroflexion not performed due to small size of rectum. - Biopsies were taken with a cold forceps from the right colon   1. Surgical [P], ileocecal valve, hepatic flexure, descending, polyp (3) -TUBULAR ADENOMAS AND BENIGN COLONIC MUCOSA. -NO HIGH GRADE DYSPLASIA OR MALIGNANCY IDENTIFIED. 2. Surgical [P], random right colon -BENIGN COLONIC MUCOSA WITH NO HISTOPATHOLOGIC ABNORMALITY. -NO EVIDENCE OF LYMPHOCYTIC OR COLLAGENOUS COLITIS, INFLAMMATORY BOWEL DISEASE OR MALIGNANCY     CT abdomen / pelvis without contrast 07/28/21 - IMPRESSION: No acute findings or other significant abnormality.   GI pathogen panel negative Hgb 13.6, WBC 11.4 CRP 4.0 TTG and IgA normal   EGD 10/12/21 -  - A 3 cm hiatal hernia was present. - The exam of the esophagus was otherwise normal. No Barrett's. No focal stenosis / stricture. -  A few small sessile polyps were found in the gastric body. Biopsies were taken with a cold forceps for histology as a representative sample to rule out adenomatous change. - Diffuse mildly erythematous mucosa was found in the gastric antrum. - The exam of the stomach was otherwise normal. - Biopsies were taken with a cold forceps in the gastric body, at the incisura and in the gastric antrum for Helicobacter pylori testing. - Patchy mildly erythematous mucosa was found in the  second portion of the duodenum. - The exam of the duodenum was otherwise normal. - Biopsies for histology were taken with a cold forceps in the duodenal bulb and in the second portion of the duodenum for evaluation of celiac disease.   Colonoscopy 10/12/21 - The perianal and digital rectal examinations were normal. - The terminal ileum appeared normal. - Two sessile polyps were found in the ascending colon. The polyps were 3 to 4 mm in size. These polyps were removed with a cold snare. Resection and retrieval were complete. - A single medium-mouthed diverticulum was found in the ascending colon. - Internal hemorrhoids were found during retroflexion. The hemorrhoids were small. - The exam was otherwise without abnormality. Several minutes spent lavaging the colon to achieve adequate views. - Biopsies for histology were taken with a cold forceps from the right colon, left colon and transverse colon for evaluation of microscopic colitis.   Diagnosis 1. Surgical [P], small bowel - DUODENAL MUCOSA WITH FOCAL EROSION AND MILD ASSOCIATED INFLAMMATION. - NO FEATURES OF CELIAC SPRUE OR GRANULOMAS. 2. Surgical [P], gastric antrum and gastric body - ANTRAL AND OXYNTIC MUCOSA WITH MILD CHANGES OF REACTIVE GASTROPATHY. [name] NEGATIVE FOR HELICOBACTER PYLORI. - NO INTESTINAL METAPLASIA, DYSPLASIA OR CARCINOMA. 3. Surgical [P], gastric polyps - FUNDIC GLAND POLYP. - NO INTESTINAL METAPLASIA, ADENOMATOUS CHANGE OR CARCINOMA. 4. Surgical [P], colon, ascending, polyp (2) - TUBULAR ADENOMA(S). - NO HIGH GRADE DYSPLASIA OR CARCINOMA. - INFLAMMATORY POLYP. 5. Surgical [P], random sites, left colon - UNREMARKABLE COLONIC MUCOSA. - NO MICROSCOPIC COLITIS, ACTIVE INFLAMMATION OR CHRONIC CHANGES. Claudette Laws   Past Medical History:  Diagnosis Date   Allergy    rhinitis   Anemia    Anxiety    Blood transfusion    x2 1983 surgery blood loss   Blood transfusion without reported diagnosis     Depression    Depression    Facial paralysis    right face/ chronic pain syndrome (pain management Dr Sydell Axon)   [id number](673.4)    Hyperlipidemia    Hypotension    Iron deficiency anemia 11/15/2014   Iron malabsorption 11/15/2014   Osteoarthritis    Trigeminal neuralgia    right   Trigeminal neuralgia 2002     Past Surgical History:  Procedure Laterality Date   COLONOSCOPY     ESOPHAGOGASTRODUODENOSCOPY     gamma knife Right 01/2018   MANDIBLE SURGERY     NASAL SINUS SURGERY     OSTEOTOMY     plastic facial surgery     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   Family History  Problem Relation Age of Onset   Breast cancer Mother        lumpectomy and radiation   Obesity Mother    Stroke Father    Diverticulitis Father    Hypertension Father    Hyperlipidemia Father    Depression Father    Alzheimer's disease Father    Colon cancer Neg Hx    Stomach cancer Neg  Hx    Esophageal cancer Neg Hx    Pancreatic cancer Neg Hx    Social History   Tobacco Use   Smoking status: Some Days    Years: 6.00    Types: Cigarettes    Last attempt to quit: 12/31/2000    Years since quitting: 21.1   Smokeless tobacco: Never   Tobacco comments:    Social smoker   Vaping Use   Vaping Use: Never used  Substance Use Topics   Alcohol use: No    Alcohol/week: 0.0 standard drinks   Drug use: No   Current Outpatient Medications  Medication Sig Dispense Refill   AMBULATORY NON FORMULARY MEDICATION Ketamine nose spray: use 2 sprays prn pain, up to 10 sprays a day     Betamethasone Valerate 0.12 % foam Apply 1 application topically 2 (two) times daily. To scalp area 100 g 1   Carboxymethylcellulose Sodium 1 % GEL Apply to eye as needed.     clonazePAM (KLONOPIN) 1 MG tablet Take 1 tablet (1 mg total) by mouth 2 (two) times daily. 30 tablet 2   colestipol (COLESTID) 1 g tablet Take 1 tablet (1 g total) by mouth 2 (two) times daily. 60 tablet 6    doxycycline (VIBRA-TABS) 100 MG tablet Take 1 tablet (100 mg total) by mouth 2 (two) times daily. 14 tablet 0   DULoxetine (CYMBALTA) 60 MG capsule Take 1 capsule (60 mg total) by mouth daily. Appt for refills 90 capsule 0   estradiol (ESTRACE) 0.1 MG/GM vaginal cream Place 1 Applicatorful vaginally at bedtime. X 7 days, then 3 x a week 85 g 3   ferrous fumarate (HEMOCYTE - 106 MG FE) 325 (106 Fe) MG TABS tablet Take 1 tablet by mouth every other day.     Fluocinolone Acetonide Scalp 0.01 % OIL Apply 1 application topically at bedtime as needed. 119 mL 1   fluticasone (FLONASE) 50 MCG/ACT nasal spray Place 2 sprays into both nostrils daily. 16 g 6   frovatriptan (FROVA) 2.5 MG tablet Take 2.5 mg by mouth daily as needed.     gabapentin (NEURONTIN) 600 MG tablet Take 600 mg by mouth 3 (three) times daily.     guaiFENesin (MUCINEX) 600 MG 12 hr tablet Take 2 tablets (1,200 mg total) by mouth 2 (two) times daily. 30 tablet 2   IBUPROFEN PO Take by mouth as needed.     ketamine (KETALAR) 10 MG/ML injection Inject into the vein every 3 (three) months.      loperamide (IMODIUM A-D) 2 MG tablet Take 1 tablet (2 mg total) by mouth as needed for diarrhea or loose stools. 30 tablet 0   Multiple Vitamin (MULTIVITAMIN) capsule Take 1 capsule by mouth daily.     omeprazole (PRILOSEC) 40 MG capsule TAKE ONE CAPSULE BY MOUTH DAILY 90 capsule 3   Oxcarbazepine (TRILEPTAL) 300 MG tablet Take 300 mg by mouth 2 (two) times daily.     Probiotic Product (PROBIOTIC PO) Take by mouth.     promethazine (PHENERGAN) 25 MG tablet Take 1 tablet (25 mg total) by mouth every 8 (eight) hours as needed for nausea or vomiting. 20 tablet 0   Pseudoephedrine-Acetaminophen (SINUS PO) Take by mouth as needed.     rosuvastatin (CRESTOR) 20 MG tablet Take 1 tablet (20 mg total) by mouth daily. 90 tablet 3   tiZANidine (ZANAFLEX) 4 MG tablet Take 4 mg by mouth every 6 (six) hours as needed.     traZODone (DESYREL) 100  MG tablet TAKE 2  TABLETS(200 MG) BY MOUTH AT BEDTIME AS NEEDED FOR SLEEP 180 tablet 1   vitamin B-12 (CYANOCOBALAMIN) 1000 MCG tablet Take 1,000 mcg by mouth daily.     vitamin C (ASCORBIC ACID) 500 MG tablet Take 500 mg by mouth daily.     Vitamin D, Ergocalciferol, (DRISDOL) 1.25 MG (50000 UNIT) CAPS capsule Take 1 capsule (50,000 Units total) by mouth every 7 (seven) days. 12 capsule 0   No current facility-administered medications for this visit.   Allergies  Allergen Reactions   Azithromycin Rash    REACTION: C Diff Other reaction(s): Other (See Comments), Other (See Comments) REACTION: C Diff REACTION: C Diff REACTION: C Diff    Methadone     Other reaction(s): Hallucinations, Other (See Comments), Other (See Comments) Hallucinations Hallucinations Hallucinations    Tapentadol Anxiety    Other reaction(s): Dizziness   Phenobarbital     REACTION: Excited   Wellbutrin [Bupropion] Other (See Comments)    Not effective.      Review of Systems: All systems reviewed and negative except where noted in HPI.    MM 3D SCREEN BREAST BILATERAL  Result Date: 02/15/2022 CLINICAL DATA:  Screening. EXAM: DIGITAL SCREENING BILATERAL MAMMOGRAM WITH TOMOSYNTHESIS AND CAD TECHNIQUE: Bilateral screening digital craniocaudal and mediolateral oblique mammograms were obtained. Bilateral screening digital breast tomosynthesis was performed. The images were evaluated with computer-aided detection. COMPARISON:  Previous exam(s). ACR Breast Density Category b: There are scattered areas of fibroglandular density. FINDINGS: There are no findings suspicious for malignancy. IMPRESSION: No mammographic evidence of malignancy. A result letter of this screening mammogram will be mailed directly to the patient. RECOMMENDATION: Screening mammogram in one year. (Code:SM-B-01Y) BI-RADS CATEGORY  1: Negative. Electronically Signed   By: Audie Pinto M.D.   On: 02/15/2022 13:22    Lab Results  Component Value Date   WBC  11.4 (H) 09/27/2021   HGB 13.6 09/27/2021   HCT 41.5 09/27/2021   MCV 80.4 09/27/2021   PLT 191.0 09/27/2021    Lab Results  Component Value Date   CREATININE 1.09 09/27/2021   BUN 15 09/27/2021   NA 137 09/27/2021   K 4.3 09/27/2021   CL 102 09/27/2021   CO2 25 09/27/2021   Lab Results  Component Value Date   IRON 113 05/11/2021   TIBC 398 05/11/2021   FERRITIN 24 05/11/2021     Physical Exam: BP (!) 144/92    Pulse 90    Ht 5\' 5"  (1.651 m)    Wt 204 lb (92.5 kg)    SpO2 97%    BMI 33.95 kg/m  Constitutional: Pleasant,well-developed, female in no acute distress. Neurological: Alert and oriented to person place and time. Psychiatric: Normal mood and affect. Behavior is normal.   ASSESSMENT AND PLAN: 56 year old female here for reassessment of the following:  Chronic diarrhea Iron deficiency anemia B12 deficiency GERD History of colon polyps  Work-up as outlined above.  EGD and colonoscopy failed to show a clear cause for her symptoms and her iron deficiency/B12 deficiency, perhaps B12 deficiency due to PPI use.  On supplemental iron her reported iron deficiency although she has never really been anemic.  Since of last seen her we put her on Colestid twice daily and Imodium as needed and she is doing much better on that regimen, still some mild symptoms that bother her forever.  We discussed options.  I like to repeat her CBC with iron studies and B12 to make sure things are  stable.  If she has iron deficiency despite supplemental oral iron then would recommend capsule endoscopy to clear her small bowel.  She previously had a CT scan of her abdomen last year with the symptoms and her small bowel looked normal.  I reassured her I do not think were missing anything concerning however if her loose stools persist and persistent IDA then we may want to consider capsule study.  She wants to think about this and await her labs first before making a decision.  We reviewed her  medication list.  She can try taking Imodium once every morning and titrate up as needed if she wants to reduce her stool frequency a bit.  Alternatively she could increase her Colestid to 2 tabs twice daily if needed.  For occasional abdominal cramping also provided Bentyl 10 mg every 8 hours which also may help her loose stools as well.  Again may consider capsule endoscopy pending her course and lab results.  She will follow-up in 3 months for reassessment.  She agrees  Plan: - lab for CBC, TIBC / ferritin, B12 level - Immodium 1 tab every AM and titrate up as needed - continue colestid 1gm BID - 3 month supply with 3 refills, she can increase to 2 tabs BID if needed - start Bentyl 10mg  every 8 hours PRN #[id number] RF3 - considering capsule endoscopy - will await blood work first, if symptoms persist despite change in regimen she may also consider it in that setting. - f/u 3 months  I spent 40 minutes of time, including in depth chart review, face-to-face time with the patient, coordinating care, and documenting this encounter.  Jolly Mango, MD [facility]

## 2022-03-02 NOTE — Patient Instructions (Addendum)
If you are age 56 or older, your body mass index should be between 23-30. Your Body mass index is 33.95 kg/m?Marland Kitchen If this is out of the aforementioned range listed, please consider follow up with your Primary Care Provider. ? ?If you are age 74 or younger, your body mass index should be between 19-25. Your Body mass index is 33.95 kg/m?Marland Kitchen If this is out of the aformentioned range listed, please consider follow up with your Primary Care Provider.  ? ?________________________________________________________ ? ?The [facility] providers would like to encourage you to use [facility] to communicate with providers for non-urgent requests or questions.  Due to long hold times on the telephone, sending your provider a message by [facility] may be a faster and more efficient way to get a response.  Please allow 48 business hours for a response.  Please remember that this is for non-urgent requests.  ?_______________________________________________________  ? ?Please go to the lab in the basement of our building to have lab work done as you leave today. Hit "B" for basement when you get on the elevator.  When the doors open the lab is on your left.  We will call you with the results. Thank you. ? ?Take Imodium over-the-counter every morning and then titrate as needed. ? ?Continue Colestid 1 g twice a day.  (Note: You can increase to 2 g twice daily as needed) ? ?We have sent the following medications to your pharmacy for you to pick up at your convenience: ?Bentyl 10mg : Take every 8 hours as needed ? ?You have been scheduled for an appointment on Friday, 6-2 at 9:00 am. ? ?Thank you for entrusting me with your care and for choosing [employer], ?Dr. [place] Cellar ? ? ?

## 2022-03-05 ENCOUNTER — Ambulatory Visit (INDEPENDENT_AMBULATORY_CARE_PROVIDER_SITE_OTHER): Payer: Medicare Other | Admitting: Psychology

## 2022-03-05 DIAGNOSIS — F331 Major depressive disorder, recurrent, moderate: Secondary | ICD-10-CM | POA: Diagnosis not present

## 2022-03-05 DIAGNOSIS — F411 Generalized anxiety disorder: Secondary | ICD-10-CM | POA: Diagnosis not present

## 2022-03-05 NOTE — Progress Notes (Signed)
[place] Counselor/Therapist Progress Note  Patient ID: Desiree Wilson, MRN: [id number],    Date: 03/05/2022  Time Spent: 4:00pm-4:55pm   55 minutes   Treatment Type: Individual Therapy  Reported Symptoms: anxiety, pain  Mental Status Exam: Appearance:  Casual and Neat     Behavior: Appropriate  Motor: Normal  Speech/Language:  Normal Rate  Affect: Appropriate  Mood: normal  Thought process: normal  Thought content:   WNL  Sensory/Perceptual disturbances:   WNL  Orientation: oriented to person, place, time/date, and situation  Attention: Good  Concentration: Good  Memory: WNL  Fund of knowledge:  Good  Insight:   Good  Judgment:  Good  Impulse Control: Good   Risk Assessment: Danger to Self:  No Self-injurious Behavior: No Danger to Others: No Duty to Warn:no Physical Aggression / Violence:No  Access to Firearms a concern: No  Gang Involvement:No   Subjective: Pt present for face-to-face individual therapy via video Webex.  Pt consents to telehealth video session due to COVID 19 pandemic. Location of pt: home Location of therapist: home office.  Pt states she is doing well overall.   She had a good weekend with family.  She celebrated her father's 60 birthday and her niece's 18th birthday.    Pt talked about her health.  She had a bad stomach bug last week.   She also had some increased pain.   She felt more anxious and depressed bc of her physical symptoms as well.  Pt saw her pain doctor and has an infusion set up for March 15th.   Pt also saw her GI doctor.   Pt talked about her family relationships.  She wants to talk to her brother and mother about her plans to have a commitment celebration with her partner [name].   She is disappointed about their reactions.  Helped pt process her feelings and family dynamics.  Addressed pt's expectations of her family.  She is realizing she needs to lower her expectations.   Pt talked about the history of her  illness and when she had to go on disability.   She feels lesser than bc she is not able to work full time like she use to.  Pt is working 7 hours a week at a job she likes.  She has been at this job for 2 years.   Pt wants to not rely on her parents financially.  Pt is anxious about not being able to afford her medical bills and prescriptions.   Pt's mother holds pt's money for property taxes and other annual fees so that pt won't spend it.  Pt states it is hard for her to save at times bc she tends to spend money.  Pt states her credit is not very good.   Pt talked about her friendships.  She has very close friendships that pt feels fortunate to have.  Addressed how she contributes to those friendships as well.   Pt states the weeks are harder for her bc she is mostly by herself.  On the weekends [name] is home and they spend a lot of time together.   Pt talked about wanting to work on discipline for herself regarding household management.  She wants to declutter and clean more.  She wants to not feel embarrassed about having people over.  Addressed the barriers to her doing housework.  Pt gets overwhelmed and procrastinates.  Addressed how pt can break down tasks into very small attainable goals.   Worked  on self care strategies. Provided supportive therapy.    Interventions: Cognitive Behavioral Therapy and Insight-Oriented  Diagnosis: F33.1 and F41.1   Plan of Care: Recommend ongoing therapy.   Pt participated with setting treatment goals.   Pt states her goals for therapy are to have a place to process her feelings.   Pt wants to improve coping skills.   Plan to continue to meet every two weeks.  Pt is progressing toward treatment goals.    Treatment Plan (Treatment Plan Target Date: 02/12/2023) Client Abilities/Strengths  Pt is bright, engaging, and motivated for therapy.  Client Treatment Preferences  Individual therapy.  Client Statement of Needs  Improve copings skills and have a safe  place to talk about feelings.   Symptoms  Depressed or irritable mood. Excessive and/or unrealistic worry that is difficult to control occurring more days than not for at least 6 months about a number of events or activities. Hypervigilance (e.g., feeling constantly on edge, experiencing concentration difficulties, having trouble falling or staying asleep, exhibiting a general state of irritability).  Problems Addressed  Unipolar Depression, Anxiety Goals 1. Alleviate depressive symptoms and return to previous level of effective functioning. 2. Appropriately grieve the loss in order to normalize mood and to return to previously adaptive level of functioning. Objective Learn and implement behavioral strategies to overcome depression. Target Date: 2023-02-12 Frequency: Biweekly  Progress: 10 Modality: individual  Related Interventions Assist the client in developing skills that increase the likelihood of deriving pleasure from behavioral activation (e.g., assertiveness skills, developing an exercise plan, less internal/more external focus, increased social involvement); reinforce success. Engage the client in "behavioral activation," increasing his/her activity level and contact with sources of reward, while identifying processes that inhibit activation. use behavioral techniques such as instruction, rehearsal, role-playing, role reversal, as needed, to facilitate activity in the client's daily life; reinforce success. 3. Develop healthy interpersonal relationships that lead to the alleviation and help prevent the relapse of depression. 4. Develop healthy thinking patterns and beliefs about self, others, and the world that lead to the alleviation and help prevent the relapse of depression. 5. Enhance ability to effectively cope with the full variety of life's worries and anxieties. 6. Learn and implement coping skills that result in a reduction of anxiety and worry, and improved daily  functioning. Objective Learn and implement problem-solving strategies for realistically addressing worries. Target Date: 2023-02-12 Frequency: Biweekly  Progress: 10 Modality: individual  Related Interventions Assign the client a homework exercise in which he/she problem-solves a current problem (see Mastery of Your Anxiety and Worry: Workbook by Adora Fridge and Eliot Ford or Generalized Anxiety Disorder by Eather Colas, and Eliot Ford); review, reinforce success, and provide corrective feedback toward improvement. Teach the client problem-solving strategies involving specifically defining a problem, generating options for addressing it, evaluating the pros and cons of each option, selecting and implementing an optional action, and reevaluating and refining the action. Objective Learn and implement calming skills to reduce overall anxiety and manage anxiety symptoms. Target Date: 2023-02-12 Frequency: Biweekly  Progress: 10 Modality: individual  Related Interventions Assign the client to read about progressive muscle relaxation and other calming strategies in relevant books or treatment manuals (e.g., Progressive Relaxation Training by Leroy Kennedy; Mastery of Your Anxiety and Worry: Workbook by Beckie Busing). Assign the client homework each session in which he/she practices relaxation exercises daily, gradually applying them progressively from non-anxiety-provoking to anxiety-provoking situations; review and reinforce success while providing corrective feedback toward improvement. Teach the client calming/relaxation skills (e.g., applied  relaxation, progressive muscle relaxation, cue controlled relaxation; mindful breathing; biofeedback) and how to discriminate better between relaxation and tension; teach the client how to apply these skills to his/her daily life. 7. Recognize, accept, and cope with feelings of depression. 8. Reduce overall frequency, intensity, and duration of the anxiety so  that daily functioning is not impaired. 9. Resolve the core conflict that is the source of anxiety. 10. Stabilize anxiety level while increasing ability to function on a daily basis. Diagnosis Axis none F33.1 Major Depressive Disorder  Axis none F41.1 General Anxiety Disorder  Conditions For Discharge Achievement of treatment goals and objectives   Clint Bolder, LCSW

## 2022-03-07 ENCOUNTER — Other Ambulatory Visit: Payer: Self-pay

## 2022-03-07 DIAGNOSIS — K529 Noninfective gastroenteritis and colitis, unspecified: Secondary | ICD-10-CM

## 2022-03-07 DIAGNOSIS — D509 Iron deficiency anemia, unspecified: Secondary | ICD-10-CM

## 2022-03-14 DIAGNOSIS — G500 Trigeminal neuralgia: Secondary | ICD-10-CM | POA: Diagnosis not present

## 2022-03-14 DIAGNOSIS — G894 Chronic pain syndrome: Secondary | ICD-10-CM | POA: Diagnosis not present

## 2022-03-14 DIAGNOSIS — G501 Atypical facial pain: Secondary | ICD-10-CM | POA: Diagnosis not present

## 2022-03-15 IMAGING — MG MM DIGITAL SCREENING BILAT W/ TOMO AND CAD
8 series · 8 of 24 positions shown · non-contrast
Comparison: Previous exam(s).

CLINICAL DATA: Screening.

EXAM:
DIGITAL SCREENING BILATERAL MAMMOGRAM WITH TOMOSYNTHESIS AND CAD
TECHNIQUE: Bilateral screening digital craniocaudal and mediolateral oblique
mammograms were obtained. Bilateral screening digital breast
tomosynthesis was performed. The images were evaluated with
computer-aided detection.

[L CC synth-2D]
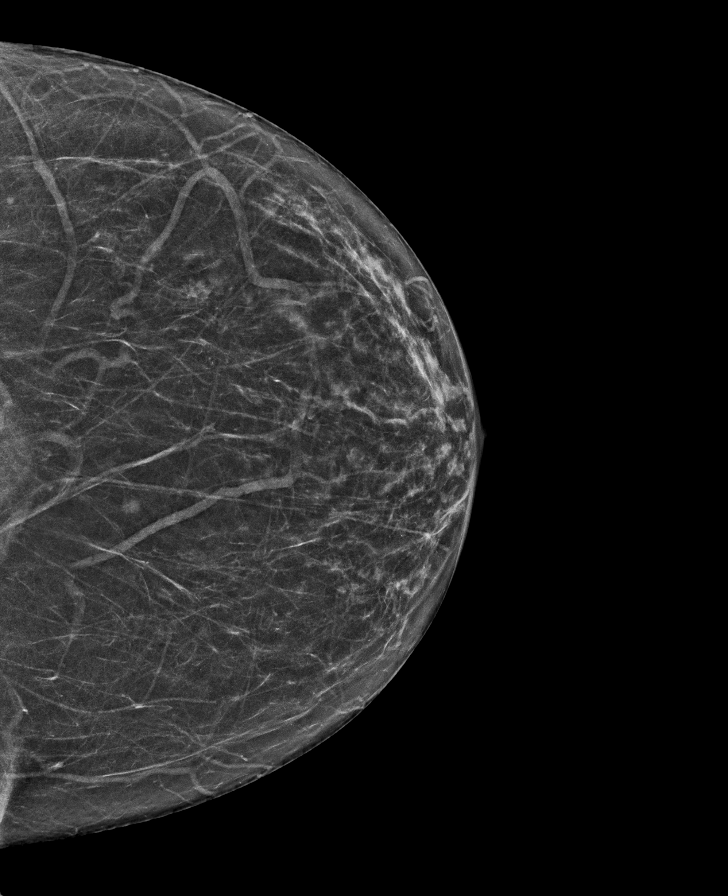

[R MLO synth-2D]
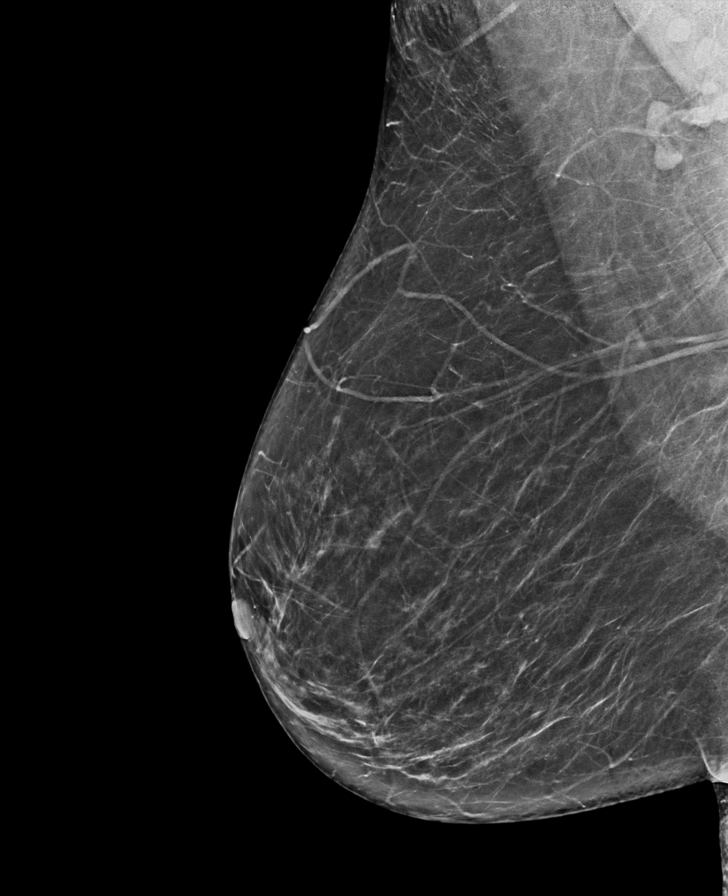

[R CC synth-2D]
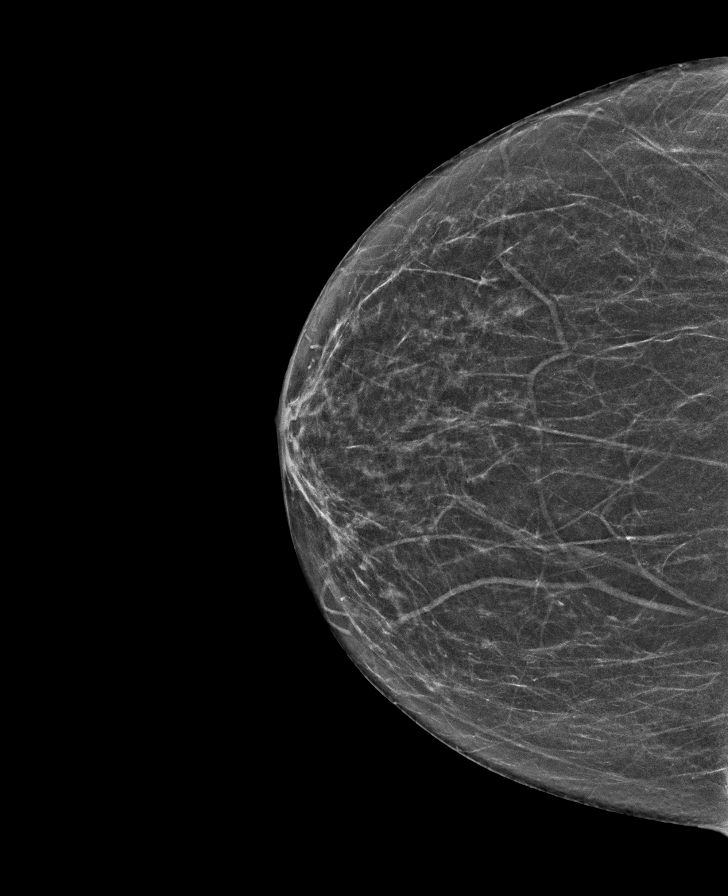

[L MLO synth-2D]
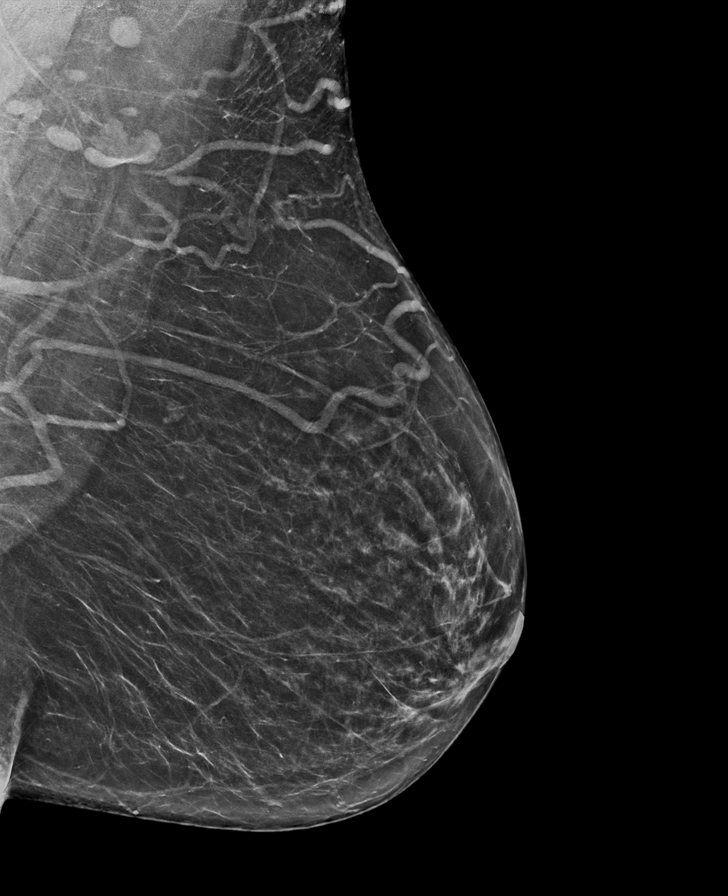

[L MLO tomo · tomo slice 35/70.0]
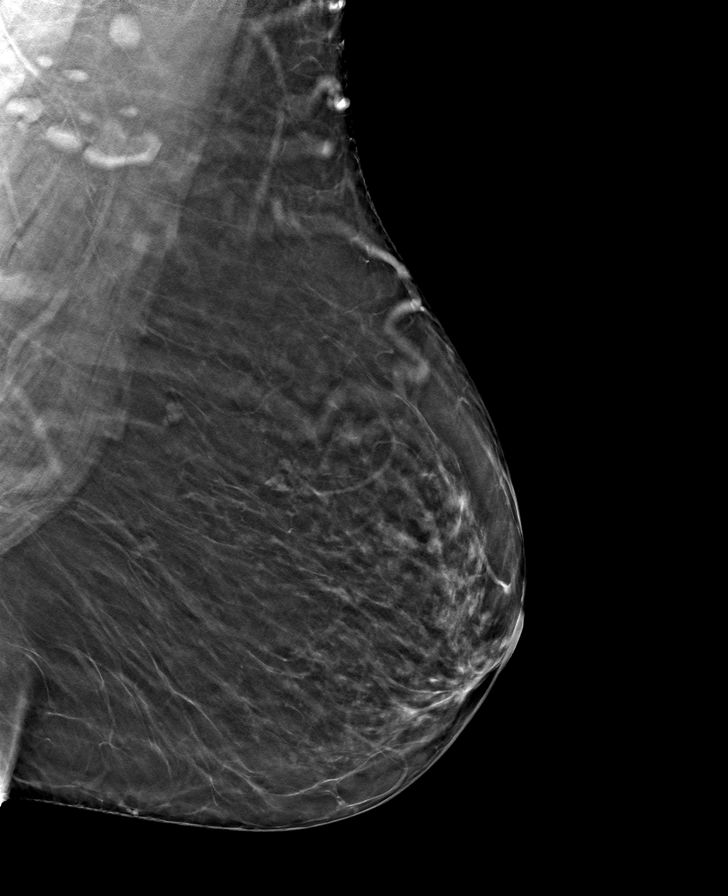

[L CC tomo · tomo slice 30/59.0]
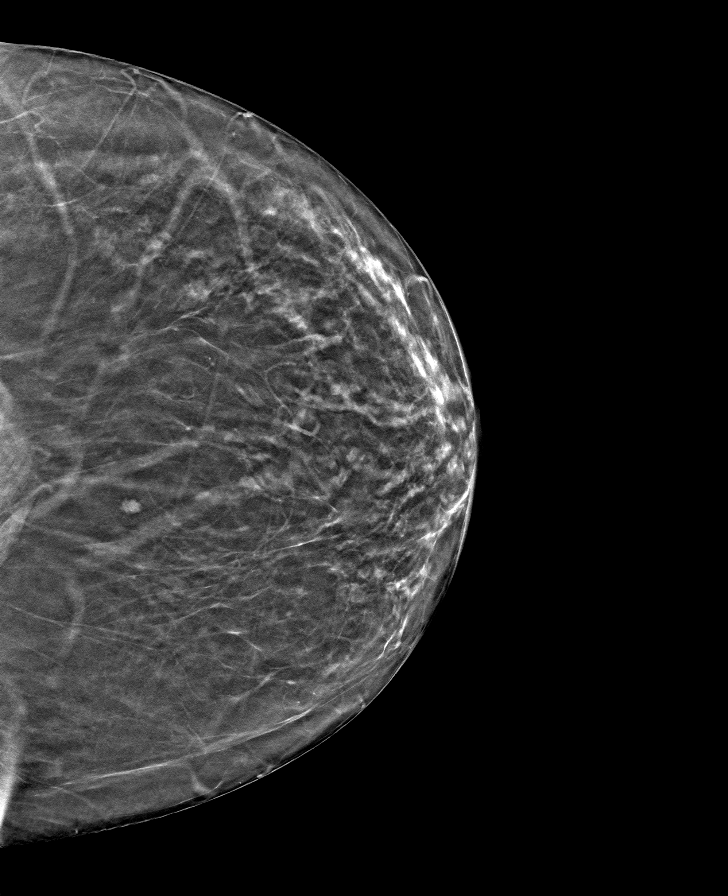

[R MLO tomo · tomo slice 37/72.0]
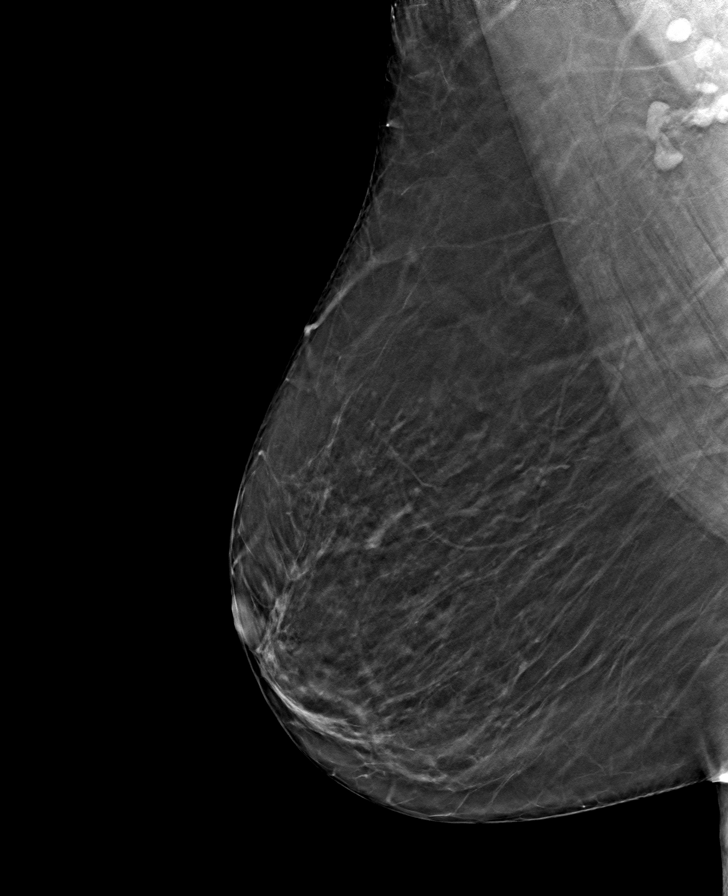

[R CC tomo · tomo slice 30/59.0]
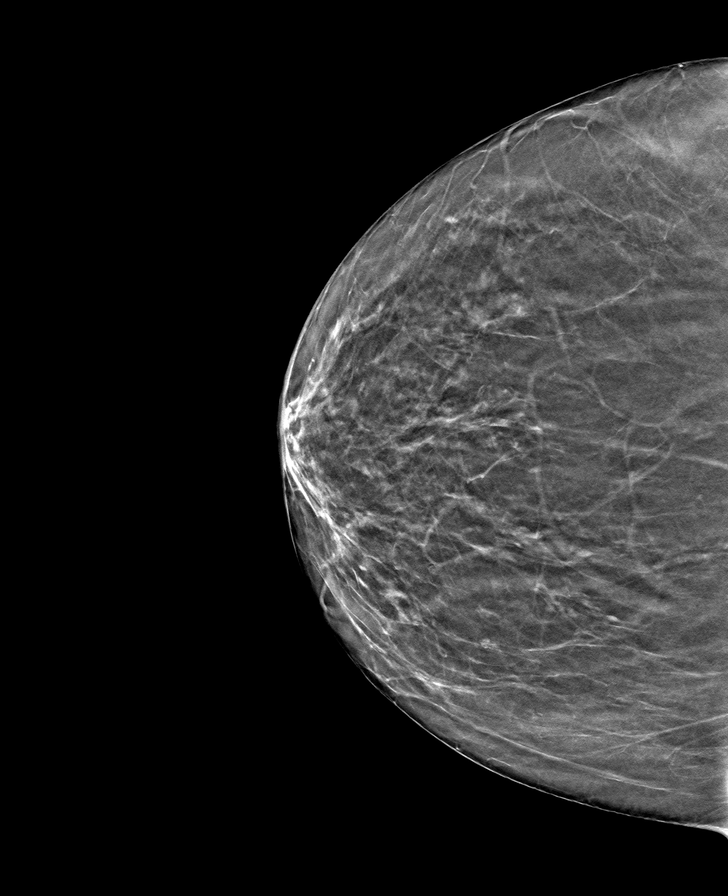

[8 of 24 positions shown; findings below may reference images not displayed]

ACR Breast Density Category b: There are scattered areas of
fibroglandular density.
FINDINGS: There are no findings suspicious for malignancy.
IMPRESSION: No mammographic evidence of malignancy. A result letter of this
screening mammogram will be mailed directly to the patient.

RECOMMENDATION:
Screening mammogram in one year. (Code:51-O-LD2)

BI-RADS CATEGORY  1: Negative.

## 2022-03-17 ENCOUNTER — Encounter: Payer: Self-pay | Admitting: Gastroenterology

## 2022-03-22 ENCOUNTER — Ambulatory Visit (INDEPENDENT_AMBULATORY_CARE_PROVIDER_SITE_OTHER): Payer: Medicare Other | Admitting: Gastroenterology

## 2022-03-22 ENCOUNTER — Encounter: Payer: Self-pay | Admitting: Gastroenterology

## 2022-03-22 DIAGNOSIS — D509 Iron deficiency anemia, unspecified: Secondary | ICD-10-CM | POA: Diagnosis not present

## 2022-03-22 DIAGNOSIS — K529 Noninfective gastroenteritis and colitis, unspecified: Secondary | ICD-10-CM

## 2022-03-22 NOTE — Progress Notes (Signed)
Capsule ID: [id number] ?Exp: 05-07-2023 ?LOT: [id number] ? ?Patient arrived for Capsule Endoscopy. Reported the prep went well. This nurse explained dietary restrictions for the next few hours. Patient verbalized understanding. Opened capsule, ensured capsule was flashing prior to the patient swallowing the capsule. Patient swallowed capsule without difficulty. Patient instructed to return to the office at 4:00 pm today for removal of the recording equipment, to call the office with any questions and if no capsule was visualized after 72 hours. No further questions by the conclusion of the visit. ? ?

## 2022-03-22 NOTE — Patient Instructions (Signed)
You may have a clear liquid diet starting at 10:30 am ? ?You may have a light lunch beginning at 12:30 pm ( ex 1/2 sandwich and a bowl of soup) ? ?You may resume your normal diet at 5:00 pm ? ?If you experience any abdominal pain, nausea ,or vomiting after ingesting the capsule please call [phone] to notify the nurse ? ?You may not have an MRI until you have confirmation that you have passed the capsule.  ? ?Please return to the office today by 4:00 pm for equipment removal.  ? ?Please allow up to 2 two weeks for your provider to review the findings of the capsule endoscopy.  ? ? ?

## 2022-03-29 ENCOUNTER — Telehealth: Payer: Self-pay | Admitting: Gastroenterology

## 2022-03-29 NOTE — Telephone Encounter (Signed)
Capsule study completed / interpreted - 03/22/22: ?Overall, while some suboptimal views due to prep, there are no concerning findings on this exam. Mucosa appears normal. No pathology seen to account for loose stools or vitamin deficiencies. ? ? ?[place] can you please relay this to the patient. ?She should continue colestid 1gm BID, increase to 2 tabs BID If needed ?Continue immodium, titrate up as needed ?Continue bentyl PRN ?I will see her in 3 months in the office for follow up.  ? ?Thanks ? ?

## 2022-03-30 NOTE — Telephone Encounter (Signed)
Lm on vm for patient to return call 

## 2022-04-02 NOTE — Telephone Encounter (Signed)
2nd attempt - phone goes straight to vm. Lm on vm for patient to return call. ?

## 2022-04-03 ENCOUNTER — Ambulatory Visit: Payer: Medicare Other | Admitting: Psychology

## 2022-04-04 NOTE — Telephone Encounter (Signed)
3rd attempt - Lm on vm for patient to return call. ? ?Pt uses my chart. MyChart message sent to patient with results and recommendations. ?

## 2022-04-09 NOTE — Telephone Encounter (Signed)
No return call received and patient has not reviewed MyChart message.  ?Letter mailed to pt with results. ?

## 2022-04-10 ENCOUNTER — Encounter: Payer: Self-pay | Admitting: Family Medicine

## 2022-04-10 NOTE — Telephone Encounter (Signed)
We can put on nurse visit and I can do a wound culture.  ?

## 2022-04-19 ENCOUNTER — Ambulatory Visit: Payer: Medicare Other | Admitting: Psychology

## 2022-04-19 DIAGNOSIS — F331 Major depressive disorder, recurrent, moderate: Secondary | ICD-10-CM

## 2022-04-19 DIAGNOSIS — F411 Generalized anxiety disorder: Secondary | ICD-10-CM | POA: Diagnosis not present
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
## 2022-04-24 DIAGNOSIS — N952 Postmenopausal atrophic vaginitis: Secondary | ICD-10-CM | POA: Diagnosis not present

## 2022-04-24 DIAGNOSIS — N898 Other specified noninflammatory disorders of vagina: Secondary | ICD-10-CM | POA: Diagnosis not present

## 2022-04-24 DIAGNOSIS — N9489 Other specified conditions associated with female genital organs and menstrual cycle: Secondary | ICD-10-CM | POA: Diagnosis not present

## 2022-04-26 DIAGNOSIS — L600 Ingrowing nail: Secondary | ICD-10-CM | POA: Diagnosis not present

## 2022-04-26 DIAGNOSIS — M79672 Pain in left foot: Secondary | ICD-10-CM | POA: Diagnosis not present

## 2022-04-26 DIAGNOSIS — M79671 Pain in right foot: Secondary | ICD-10-CM | POA: Diagnosis not present

## 2022-05-02 ENCOUNTER — Ambulatory Visit (INDEPENDENT_AMBULATORY_CARE_PROVIDER_SITE_OTHER): Payer: Medicare Other | Admitting: Psychology

## 2022-05-02 ENCOUNTER — Encounter: Payer: Self-pay | Admitting: Family Medicine

## 2022-05-02 DIAGNOSIS — F331 Major depressive disorder, recurrent, moderate: Secondary | ICD-10-CM

## 2022-05-02 DIAGNOSIS — F411 Generalized anxiety disorder: Secondary | ICD-10-CM

## 2022-05-02 NOTE — Progress Notes (Signed)
[facility] Counselor/Therapist Progress Note ? ?Patient ID: Desiree Wilson, MRN: [id number],   ? ?Date: 05/02/2022 ? ?Time Spent: 10:00am-10:55am   55 minutes  ? ?Treatment Type: Individual Therapy ? ?Reported Symptoms: tearfulness, pain ? ?Mental Status Exam: ?Appearance:  Casual and Neat     ?Behavior: Appropriate  ?Motor: Normal  ?Speech/Language:  Normal Rate  ?Affect: Appropriate  ?Mood: normal  ?Thought process: normal  ?Thought content:   WNL  ?Sensory/Perceptual disturbances:   WNL  ?Orientation: oriented to person, place, time/date, and situation  ?Attention: Good  ?Concentration: Good  ?Memory: WNL  ?Fund of knowledge:  Good  ?Insight:   Good  ?Judgment:  Good  ?Impulse Control: Good  ? ?Risk Assessment: ?Danger to Self:  No ?Self-injurious Behavior: No ?Danger to Others: No ?Duty to Warn:no ?Physical Aggression / Violence:No  ?Access to Firearms a concern: No  ?Gang Involvement:No  ? ?Subjective:  ?Pt present for face-to-face individual therapy via video Webex.  Pt consents to telehealth video session due to COVID 19 pandemic. ?Location of pt: home ?Location of therapist: home office.  ?Pt talked about her health.  She has been sick the past couple of days and still does not feel well but is recovering.   ?Pt saw a podiatrist last week and she had her big toenails removed.   The procedure was very painful.   ?Pt has been in a lot of pain.   She was tearful as she talked about her chronic pain.  She gets tired of dealing with it.  Worked on pain management and coping strategies.  ?Pt has been alone this week bc [name] is out of town for work.   She feels sad and lonely.  ?Pt went to [place] to be with friends last weekend and had a good time.  Several of her friends offered to help her with plans for her wedding ceremony in September.   ?Pt talked about her worries about finances. Addressed how she and [name] manage money.  Worked on problem solving.   ?Worked on self care  strategies. ?Provided supportive therapy.   ? ?Interventions: Cognitive Behavioral Therapy and Insight-Oriented ? ?Diagnosis: F33.1 and F41.1 ? ? ?Plan of Care: Recommend ongoing therapy.   Pt participated with setting treatment goals.   Pt states her goals for therapy are to have a place to process her feelings.   Pt wants to improve coping skills.   ?Plan to continue to meet every two weeks.  Pt is progressing toward treatment goals.   ? ?Treatment Plan (Treatment Plan Target Date: 02/12/2023) ?Client Abilities/Strengths  ?Pt is bright, engaging, and motivated for therapy.  ?Client Treatment Preferences  ?Individual therapy.  ?Client Statement of Needs  ?Improve copings skills and have a safe place to talk about feelings.   ?Symptoms  ?Depressed or irritable mood. Excessive and/or unrealistic worry that is difficult to control occurring more days than not for at least 6 months about a number of events or activities. Hypervigilance (e.g., feeling constantly on edge, experiencing concentration difficulties, having trouble falling or staying asleep, exhibiting a general state of irritability).  ?Problems Addressed  ?Unipolar Depression, Anxiety ?Goals ?1. Alleviate depressive symptoms and return to previous level of effective functioning. ?2. Appropriately grieve the loss in order to normalize mood and to return to previously adaptive level of functioning. ?Objective ?Learn and implement behavioral strategies to overcome depression. ?Target Date: 2023-02-12 Frequency: Biweekly  ?Progress: 10 Modality: individual  ?Related Interventions ?Assist the client in developing skills that  increase the likelihood of deriving pleasure from behavioral activation (e.g., assertiveness skills, developing an exercise plan, less internal/more external focus, increased social involvement); reinforce success. ?Engage the client in "behavioral activation," increasing his/her activity level and contact with sources of reward, while  identifying processes that inhibit activation. use behavioral techniques such as instruction, rehearsal, role-playing, role reversal, as needed, to facilitate activity in the client's daily life; reinforce success. ?3. Develop healthy interpersonal relationships that lead to the alleviation and help prevent the relapse of depression. ?4. Develop healthy thinking patterns and beliefs about self, others, and the world that lead to the alleviation and help prevent the relapse of depression. ?5. Enhance ability to effectively cope with the full variety of life's worries and anxieties. ?6. Learn and implement coping skills that result in a reduction of anxiety and worry, and improved daily functioning. ?Objective ?Learn and implement problem-solving strategies for realistically addressing worries. ?Target Date: 2023-02-12 Frequency: Biweekly  ?Progress: 10 Modality: individual  ?Related Interventions ?Assign the client a homework exercise in which he/she problem-solves a current problem (see Mastery of Your Anxiety and Worry: Workbook by Adora Fridge and Eliot Ford or Generalized Anxiety Disorder by Eather Colas, and Eliot Ford); review, reinforce success, and provide corrective feedback toward improvement. ?Teach the client problem-solving strategies involving specifically defining a problem, generating options for addressing it, evaluating the pros and cons of each option, selecting and implementing an optional action, and reevaluating and refining the action. ?Objective ?Learn and implement calming skills to reduce overall anxiety and manage anxiety symptoms. ?Target Date: 2023-02-12 Frequency: Biweekly  ?Progress: 10 Modality: individual  ?Related Interventions ?Assign the client to read about progressive muscle relaxation and other calming strategies in relevant books or treatment manuals (e.g., Progressive Relaxation Training by Gwynneth Aliment and Dani Gobble; Mastery of Your Anxiety and Worry: Workbook by Beckie Busing). ?Assign  the client homework each session in which he/she practices relaxation exercises daily, gradually applying them progressively from non-anxiety-provoking to anxiety-provoking situations; review and reinforce success while providing corrective feedback toward improvement. ?Teach the client calming/relaxation skills (e.g., applied relaxation, progressive muscle relaxation, cue controlled relaxation; mindful breathing; biofeedback) and how to discriminate better between relaxation and tension; teach the client how to apply these skills to his/her daily life. ?7. Recognize, accept, and cope with feelings of depression. ?8. Reduce overall frequency, intensity, and duration of the anxiety so that daily functioning is not impaired. ?9. Resolve the core conflict that is the source of anxiety. ?10. Stabilize anxiety level while increasing ability to function on a daily basis. ?Diagnosis ?Axis none F33.1 Major Depressive Disorder  ?Axis none F41.1 General Anxiety Disorder  ?Conditions For Discharge ?Achievement of treatment goals and objectives  ? ?Raenette Sakata, LCSW ? ? ? ?

## 2022-05-03 ENCOUNTER — Ambulatory Visit (INDEPENDENT_AMBULATORY_CARE_PROVIDER_SITE_OTHER): Payer: Medicare Other | Admitting: Family Medicine

## 2022-05-03 ENCOUNTER — Encounter: Payer: Self-pay | Admitting: Family Medicine

## 2022-05-03 DIAGNOSIS — J0100 Acute maxillary sinusitis, unspecified: Secondary | ICD-10-CM | POA: Diagnosis not present

## 2022-05-03 MED ORDER — AMOXICILLIN-POT CLAVULANATE 875-125 MG PO TABS: 1.0000 | ORAL_TABLET | Freq: Two times a day (BID) | ORAL | 0 refills | Status: DC

## 2022-05-03 MED ORDER — FLUTICASONE PROPIONATE 50 MCG/ACT NA SUSP: 2.0000 | Freq: Every day | NASAL | 3 refills | Status: DC

## 2022-05-03 MED ORDER — PREDNISONE 20 MG PO TABS: 40.0000 mg | ORAL_TABLET | Freq: Every day | ORAL | 0 refills | Status: DC

## 2022-05-03 NOTE — Progress Notes (Signed)
? ?  Acute Office Visit ? ?Subjective:  ? ?  ?Patient ID: Desiree Wilson, female    DOB: 1966-04-24, 56 y.o.   MRN: [id number] ? ?Chief Complaint  ?Patient presents with  ? Sinusitis  ? ? ?HPI ?Patient is in today for Pt reports that she began experiencing sxs Friday (6 days)  and they got worse by Monday. She has been taking benadryl,zyrtec-d,flonase.  Feels like it is really triggering and worsening her trigeminal neuralgia.  She says she is having a lot of pain particularly on the right side of her face, right side neck and on the top of her head bilaterally. ?  ?She took a COVID test on yesterday this was negative. She denies any fevers. However, she has had sweats,chills, loss of appetite, cough,headache,body aches,sore throat.  ? ?ROS ? ? ?   ?Objective:  ?  ?BP 129/87   Pulse 93   Ht '5\' 5"'$  (1.651 m)   Wt 202 lb (91.6 kg)   SpO2 95%   BMI 33.61 kg/m?  ? ? ?Physical Exam ?Constitutional:   ?   Appearance: She is well-developed.  ?HENT:  ?   Head: Normocephalic and atraumatic.  ?   Right Ear: External ear normal.  ?   Left Ear: External ear normal.  ?   Nose: Nose normal.  ?Eyes:  ?   Conjunctiva/sclera: Conjunctivae normal.  ?   Pupils: Pupils are equal, round, and reactive to light.  ?Neck:  ?   Thyroid: No thyromegaly.  ?Cardiovascular:  ?   Rate and Rhythm: Normal rate and regular rhythm.  ?   Heart sounds: Normal heart sounds.  ?Pulmonary:  ?   Effort: Pulmonary effort is normal.  ?   Breath sounds: Normal breath sounds. No wheezing.  ?Musculoskeletal:  ?   Cervical back: Neck supple.  ?Lymphadenopathy:  ?   Cervical: No cervical adenopathy.  ?Skin: ?   General: Skin is warm and dry.  ?Neurological:  ?   Mental Status: She is alert and oriented to person, place, and time.  ? ? ?No results found for any visits on 05/03/22. ? ? ?   ?Assessment & Plan:  ? ?Problem List Items Addressed This Visit   ?None ?Visit Diagnoses   ? ? Acute non-recurrent maxillary sinusitis      ? Relevant Medications  ?  terbinafine (LAMISIL) 250 MG tablet  ? predniSONE (DELTASONE) 20 MG tablet  ? amoxicillin-clavulanate (AUGMENTIN) 875-125 MG tablet  ? fluticasone (FLONASE) 50 MCG/ACT nasal spray  ? ?  ? ?Acute sinusitis - tx with abx, pred and needs fill on her nasal steroid spray.  If not improving over the weekend then please let us know. ? ?Meds ordered this encounter  ?Medications  ? predniSONE (DELTASONE) 20 MG tablet  ?  Sig: Take 2 tablets (40 mg total) by mouth daily with breakfast.  ?  Dispense:  10 tablet  ?  Refill:  0  ? amoxicillin-clavulanate (AUGMENTIN) 875-125 MG tablet  ?  Sig: Take 1 tablet by mouth 2 (two) times daily.  ?  Dispense:  14 tablet  ?  Refill:  0  ? fluticasone (FLONASE) 50 MCG/ACT nasal spray  ?  Sig: Place 2 sprays into both nostrils daily.  ?  Dispense:  48 g  ?  Refill:  3  ? ? ?Return if symptoms worsen or fail to improve. ? ?[name], MD ? ? ?

## 2022-05-03 NOTE — Progress Notes (Signed)
Pt reports that she began experiencing sxs Friday and they got worse by Monday. She has been taking benadryl,zyrtec-d,flonase.  ? ?She took a COVID test on yesterday this was negative. She denies any fevers. However, she has had sweats,chills, loss of appetite, cough,headache,body aches,sore throat.  ?

## 2022-05-05 ENCOUNTER — Other Ambulatory Visit: Payer: Self-pay | Admitting: Family Medicine

## 2022-05-07 DIAGNOSIS — G894 Chronic pain syndrome: Secondary | ICD-10-CM | POA: Diagnosis not present

## 2022-05-15 ENCOUNTER — Telehealth: Payer: Medicare Other | Admitting: Family Medicine

## 2022-05-15 DIAGNOSIS — F325 Major depressive disorder, single episode, in full remission: Secondary | ICD-10-CM

## 2022-05-15 DIAGNOSIS — F5101 Primary insomnia: Secondary | ICD-10-CM | POA: Diagnosis not present

## 2022-05-15 DIAGNOSIS — F3341 Major depressive disorder, recurrent, in partial remission: Secondary | ICD-10-CM

## 2022-05-15 DIAGNOSIS — G500 Trigeminal neuralgia: Secondary | ICD-10-CM | POA: Diagnosis not present

## 2022-05-15 DIAGNOSIS — N760 Acute vaginitis: Secondary | ICD-10-CM | POA: Diagnosis not present

## 2022-05-15 MED ORDER — FLUCONAZOLE 150 MG PO TABS: 150.0000 mg | ORAL_TABLET | Freq: Once | ORAL | 0 refills | Status: AC

## 2022-05-15 MED ORDER — DULOXETINE HCL 60 MG PO CPEP: 60.0000 mg | ORAL_CAPSULE | Freq: Every day | ORAL | 1 refills | Status: DC

## 2022-05-15 NOTE — Progress Notes (Signed)
? ? ?Virtual Visit via Video Note ? ?I connected with Desiree Wilson on 05/15/22 at  9:50 AM EDT by a video enabled telemedicine application and verified that I am speaking with the correct person using two identifiers. ?  ?I discussed the limitations of evaluation and management by telemedicine and the availability of in person appointments. The patient expressed understanding and agreed to proceed. ? ?Patient location: at home ?Provider location: in office ? ? ?Established Patient Office Visit ? ?Subjective   ?Patient ID: Desiree Wilson, female    DOB: 03-Jul-1966  Age: 56 y.o. MRN: [id number] ? ?No chief complaint on file. ? ? ?HPI ? ?F/U MDD - Pt reports past 4 days she forgot to take her medication.  She had taken her medication Thursday night and the next morning she left town for a trip to [place] and then realize she had not taken her medication with her.  She says by Saturday night she started to feel bad and could tell that her anxiety was increasing.  Later in the week and she was driving home and started to experience a panic attack.  Trying to drive home from [place] she was assisted by some people at a rest stop that called her brother he came and got her.  She said as soon as she got home she took her daily medications.  Now her trigeminal neuralgia is really flared up.  She does feel a little better today and that she has a little bit more of an appetite but still feels very tired.  She did not go to work yesterday or today. ?  ?She restarted her medication yesterday she hasn't had any panic attacks since however, her Trigeminal neuralgia has been bothering her. She had a f/u with her pain doctor last week but the Ketamine infusion was on backorder and she wasn't able to receive it. Pt hasn't been able to go into work. She has been home resting.  ?  ?She does have a f/u with her therapist. ? ?She also had a hemorrhoid that started bleeding a few days ago.  She says it is better now. ? ?She  also just finished up antibiotics and prednisone and now feels like she has a vaginal infection she has had swelling and itching for 3 days she has been using some over the counter Monistat. ? ? ? ? ?ROS ? ?  ?Objective:  ?  ? ?There were no vitals taken for this visit. ? ? ?Physical Exam ?Vitals and nursing note reviewed.  ?Constitutional:   ?   Appearance: She is well-developed.  ?HENT:  ?   Head: Normocephalic and atraumatic.  ?Cardiovascular:  ?   Rate and Rhythm: Normal rate and regular rhythm.  ?   Heart sounds: Normal heart sounds.  ?Pulmonary:  ?   Effort: Pulmonary effort is normal.  ?   Breath sounds: Normal breath sounds.  ?Skin: ?   General: Skin is warm and dry.  ?Neurological:  ?   Mental Status: She is alert and oriented to person, place, and time.  ?Psychiatric:     ?   Behavior: Behavior normal.  ? ? ? ?No results found for any visits on 05/15/22. ? ? ? ?The 10-year ASCVD risk score (Arnett DK, et al., 2019) is: 4.1% ? ?  ?Assessment & Plan:  ? ?Problem List Items Addressed This Visit   ? ?  ? Nervous and Auditory  ? Trigeminal neuralgia  ?  Unfortunately she is having  a significant flare with her trigeminal neuralgia so she is in a lot of pain today.  Thus we had to convert the in person visit to a virtual visit.  But she is getting back in with her pain specialist.  Think a lot of this was just triggered by being off of all her medications for 4 days. ? ?  ?  ? Relevant Medications  ? DULoxetine (CYMBALTA) 60 MG capsule  ?  ? Other  ? Recurrent major depressive disorder, in partial remission ([place])  ?  She is back on her medication and up until she missed her dose for 4 days she was actually doing really well with it.  She would like to continue her current regimen she feels like its been a good change.  We did discuss that missing several days in a row can definitely cause withdrawal symptoms which can really trigger her anxiety.  She does have a therapy appointment scheduled for tomorrow morning  which is great. ? ?  ?  ? Relevant Medications  ? DULoxetine (CYMBALTA) 60 MG capsule  ? Primary insomnia  ? Depression - Primary  ? Relevant Medications  ? DULoxetine (CYMBALTA) 60 MG capsule  ? ?Other Visit Diagnoses   ? ? Acute vaginitis      ? ?  ? ? ?Acute vaginitis-prescription for Diflucan sent to pharmacy if not better in the next 3 days then please let us know and will need to come in for swab. ? ?Return in about 3 months (around 08/15/2022) for medications and migraines .  ? ? ?I discussed the assessment and treatment plan with the patient. The patient was provided an opportunity to ask questions and all were answered. The patient agreed with the plan and demonstrated an understanding of the instructions. ?  ?The patient was advised to call back or seek an in-person evaluation if the symptoms worsen or if the condition fails to improve as anticipated. ? ? ? ?[name], MD ? ?

## 2022-05-15 NOTE — Assessment & Plan Note (Signed)
She is back on her medication and up until she missed her dose for 4 days she was actually doing really well with it.  She would like to continue her current regimen she feels like its been a good change.  We did discuss that missing several days in a row can definitely cause withdrawal symptoms which can really trigger her anxiety.  She does have a therapy appointment scheduled for tomorrow morning which is great. ?

## 2022-05-15 NOTE — Assessment & Plan Note (Signed)
Unfortunately she is having a significant flare with her trigeminal neuralgia so she is in a lot of pain today.  Thus we had to convert the in person visit to a virtual visit.  But she is getting back in with her pain specialist.  Think a lot of this was just triggered by being off of all her medications for 4 days. ?

## 2022-05-15 NOTE — Progress Notes (Signed)
Pt reports past 4 days she forgot to take her medication Friday morning and she did not have her medication while she was trying to drive home from [place] she was assisted by some people at a rest stop that called her brother he came and got her and  ? ?She restarted her medication yesterday she hasn't had any panic attacks since however, her Trigeminal neuralgia has been bothering her. She had a f/u with her pain doctor last week but the Ketamine infusion was on backorder and she wasn't able to receive it. Pt hasn't been able to go into work. She has been home resting.  ? ?She does have a f/u with her therapist. ?

## 2022-05-16 ENCOUNTER — Ambulatory Visit: Payer: Medicare Other | Admitting: Psychology

## 2022-05-16 DIAGNOSIS — F411 Generalized anxiety disorder: Secondary | ICD-10-CM | POA: Diagnosis not present

## 2022-05-16 DIAGNOSIS — F331 Major depressive disorder, recurrent, moderate: Secondary | ICD-10-CM

## 2022-05-16 NOTE — Progress Notes (Signed)
[place] Counselor/Therapist Progress Note ? ?Patient ID: Desiree Wilson, MRN: [id number],   ? ?Date: 05/16/2022 ? ?Time Spent: 11:00am-11:55am   55 minutes  ? ?Treatment Type: Individual Therapy ? ?Reported Symptoms: anxiety, pain ? ?Mental Status Exam: ?Appearance:  Casual and Neat     ?Behavior: Appropriate  ?Motor: Normal  ?Speech/Language:  Normal Rate  ?Affect: Appropriate  ?Mood: normal  ?Thought process: normal  ?Thought content:   WNL  ?Sensory/Perceptual disturbances:   WNL  ?Orientation: oriented to person, place, time/date, and situation  ?Attention: Good  ?Concentration: Good  ?Memory: WNL  ?Fund of knowledge:  Good  ?Insight:   Good  ?Judgment:  Good  ?Impulse Control: Good  ? ?Risk Assessment: ?Danger to Self:  No ?Self-injurious Behavior: No ?Danger to Others: No ?Duty to Warn:no ?Physical Aggression / Violence:No  ?Access to Firearms a concern: No  ?Gang Involvement:No  ? ?Subjective:  ?Pt present for face-to-face individual therapy via video Webex.  Pt consents to telehealth video session due to COVID 19 pandemic. ?Location of pt: home ?Location of therapist: home office.  ?Pt talked about her health.  She has been sick with a bad cold for a couple of weeks.  She is starting to feel better now.    ?Pt has been in a lot of pain. She gets tired of dealing with it.  Her pain management doctor told her there is a ketamine shortage.  This makes pt anxious.  Worked on pain management and coping strategies.  ?Pt talked about "having a crazy weekend".  She went to [facility] to stay with her friend.  She forgot to take her medicine bag with her.  They went to see a band and theatrical show on Friday night.   Pt had a good time Friday evening but had a rough day on Saturday.  She did not sleep well Friday night bc she did not have her medication.   ?Pt made plans for Sunday with her mother for Mother's Day.   The plans changed on Saturday and pt got angry and said some mean things to her  mother.  She apologized to her mother.  On Saturday pt had an anxiety attack and asked her friend for CBD gummies to help calm her down.  Pt did not eat much on Saturday and could not sleep Saturday night.  She got anxious about not being able to sleep and was worried about having to drive back home on Sunday.   Pt reached out to her friends for support.   When pt was driving home on Sunday she had a panic attack and had to stop driving.  Pt's brother came to get her and took her home.   Addressed how stressful the experience was for pt.   She acknowledged that what she experienced was because of not having her medication for 3 days.   Pt talked with her PCP and made a plan to have extra medication doses to keep in her purse and at a friend's house in [place] so this would not happen again.   ?Worked on self care strategies. ?Provided supportive therapy.   ? ?Interventions: Cognitive Behavioral Therapy and Insight-Oriented ? ?Diagnosis: F33.1 and F41.1 ? ? ?Plan of Care: Recommend ongoing therapy.   Pt participated with setting treatment goals.   Pt states her goals for therapy are to have a place to process her feelings.   Pt wants to improve coping skills.   ?Plan to continue to meet every two  weeks.  Pt is progressing toward treatment goals.   ? ?Treatment Plan (Treatment Plan Target Date: 02/12/2023) ?Client Abilities/Strengths  ?Pt is bright, engaging, and motivated for therapy.  ?Client Treatment Preferences  ?Individual therapy.  ?Client Statement of Needs  ?Improve copings skills and have a safe place to talk about feelings.   ?Symptoms  ?Depressed or irritable mood. Excessive and/or unrealistic worry that is difficult to control occurring more days than not for at least 6 months about a number of events or activities. Hypervigilance (e.g., feeling constantly on edge, experiencing concentration difficulties, having trouble falling or staying asleep, exhibiting a general state of irritability).  ?Problems  Addressed  ?Unipolar Depression, Anxiety ?Goals ?1. Alleviate depressive symptoms and return to previous level of effective functioning. ?2. Appropriately grieve the loss in order to normalize mood and to return to previously adaptive level of functioning. ?Objective ?Learn and implement behavioral strategies to overcome depression. ?Target Date: 2023-02-12 Frequency: Biweekly  ?Progress: 10 Modality: individual  ?Related Interventions ?Assist the client in developing skills that increase the likelihood of deriving pleasure from behavioral activation (e.g., assertiveness skills, developing an exercise plan, less internal/more external focus, increased social involvement); reinforce success. ?Engage the client in "behavioral activation," increasing his/her activity level and contact with sources of reward, while identifying processes that inhibit activation. use behavioral techniques such as instruction, rehearsal, role-playing, role reversal, as needed, to facilitate activity in the client's daily life; reinforce success. ?3. Develop healthy interpersonal relationships that lead to the alleviation and help prevent the relapse of depression. ?4. Develop healthy thinking patterns and beliefs about self, others, and the world that lead to the alleviation and help prevent the relapse of depression. ?5. Enhance ability to effectively cope with the full variety of life's worries and anxieties. ?6. Learn and implement coping skills that result in a reduction of anxiety and worry, and improved daily functioning. ?Objective ?Learn and implement problem-solving strategies for realistically addressing worries. ?Target Date: 2023-02-12 Frequency: Biweekly  ?Progress: 10 Modality: individual  ?Related Interventions ?Assign the client a homework exercise in which he/she problem-solves a current problem (see Mastery of Your Anxiety and Worry: Workbook by Adora Fridge and Eliot Ford or Generalized Anxiety Disorder by Eather Colas, and  Eliot Ford); review, reinforce success, and provide corrective feedback toward improvement. ?Teach the client problem-solving strategies involving specifically defining a problem, generating options for addressing it, evaluating the pros and cons of each option, selecting and implementing an optional action, and reevaluating and refining the action. ?Objective ?Learn and implement calming skills to reduce overall anxiety and manage anxiety symptoms. ?Target Date: 2023-02-12 Frequency: Biweekly  ?Progress: 10 Modality: individual  ?Related Interventions ?Assign the client to read about progressive muscle relaxation and other calming strategies in relevant books or treatment manuals (e.g., Progressive Relaxation Training by Gwynneth Aliment and Dani Gobble; Mastery of Your Anxiety and Worry: Workbook by Beckie Busing). ?Assign the client homework each session in which he/she practices relaxation exercises daily, gradually applying them progressively from non-anxiety-provoking to anxiety-provoking situations; review and reinforce success while providing corrective feedback toward improvement. ?Teach the client calming/relaxation skills (e.g., applied relaxation, progressive muscle relaxation, cue controlled relaxation; mindful breathing; biofeedback) and how to discriminate better between relaxation and tension; teach the client how to apply these skills to his/her daily life. ?7. Recognize, accept, and cope with feelings of depression. ?8. Reduce overall frequency, intensity, and duration of the anxiety so that daily functioning is not impaired. ?9. Resolve the core conflict that is the source of anxiety. ?10. Stabilize anxiety  level while increasing ability to function on a daily basis. ?Diagnosis ?Axis none F33.1 Major Depressive Disorder  ?Axis none F41.1 General Anxiety Disorder  ?Conditions For Discharge ?Achievement of treatment goals and objectives  ? ?Hiep Ollis, LCSW ? ? ?

## 2022-05-23 DIAGNOSIS — G894 Chronic pain syndrome: Secondary | ICD-10-CM | POA: Diagnosis not present

## 2022-05-23 DIAGNOSIS — Z79899 Other long term (current) drug therapy: Secondary | ICD-10-CM | POA: Diagnosis not present

## 2022-05-23 DIAGNOSIS — G501 Atypical facial pain: Secondary | ICD-10-CM | POA: Diagnosis not present

## 2022-05-23 DIAGNOSIS — G500 Trigeminal neuralgia: Secondary | ICD-10-CM | POA: Diagnosis not present

## 2022-05-30 ENCOUNTER — Ambulatory Visit: Payer: Medicare Other | Admitting: Psychology

## 2022-05-30 DIAGNOSIS — G501 Atypical facial pain: Secondary | ICD-10-CM | POA: Diagnosis not present

## 2022-05-30 DIAGNOSIS — G500 Trigeminal neuralgia: Secondary | ICD-10-CM | POA: Diagnosis not present

## 2022-06-01 ENCOUNTER — Ambulatory Visit: Payer: Medicare Other | Admitting: Gastroenterology

## 2022-06-25 ENCOUNTER — Ambulatory Visit (INDEPENDENT_AMBULATORY_CARE_PROVIDER_SITE_OTHER): Payer: Medicare Other | Admitting: Psychology

## 2022-06-25 DIAGNOSIS — F331 Major depressive disorder, recurrent, moderate: Secondary | ICD-10-CM | POA: Diagnosis not present

## 2022-06-25 DIAGNOSIS — F411 Generalized anxiety disorder: Secondary | ICD-10-CM

## 2022-06-25 NOTE — Progress Notes (Signed)
[facility] Counselor/Therapist Progress Note  Patient ID: OWYNN BENENHALEY, MRN: [id number],    Date: 06/25/2022  Time Spent: 9:00am-9:55am   55 minutes   Treatment Type: Individual Therapy  Reported Symptoms: anxiety, pain  Mental Status Exam: Appearance:  Casual and Neat     Behavior: Appropriate  Motor: Normal  Speech/Language:  Normal Rate  Affect: Appropriate  Mood: normal  Thought process: normal  Thought content:   WNL  Sensory/Perceptual disturbances:   WNL  Orientation: oriented to person, place, time/date, and situation  Attention: Good  Concentration: Good  Memory: WNL  Fund of knowledge:  Good  Insight:   Good  Judgment:  Good  Impulse Control: Good   Risk Assessment: Danger to Self:  No Self-injurious Behavior: No Danger to Others: No Duty to Warn:no Physical Aggression / Violence:No  Access to Firearms a concern: No  Gang Involvement:No   Subjective:  Pt present for face-to-face individual therapy via video Webex.  Pt consents to telehealth video session due to COVID 19 pandemic. Location of pt: home Location of therapist: home office.  Pt talked about her health.  She states it was a hard week last week with pain bc of all the rain.    Pt talked about worries about money.   She has had some unexpected expenses that are impacted what she can spend on her upcoming wedding ceremony.   Addressed pt's concerns. Pt talked about her relationship with [name].   She states they have not been doing as well lately.  Pt states [name] had a bad childhood and tends to not be very social.  [name] struggles with anxiety and depression.   He also drinks too much at times.  Pt states [name] has a Research scientist (physical sciences) when drinking and pt gets very upset about it.   Pt expressed her feelings to [facility] and told him what she needs from him.  He responded well but pt states he has historically not been good with follow through so she is not sure that he will follow through this  time.  Helped pt process her feelings and relationship dynamics.  Worked on Sport and exercise psychologist.   Worked on self care strategies. Provided supportive therapy.    Interventions: Cognitive Behavioral Therapy and Insight-Oriented  Diagnosis: F33.1 and F41.1   Plan of Care: Recommend ongoing therapy.   Pt participated with setting treatment goals.   Pt states her goals for therapy are to have a place to process her feelings.   Pt wants to improve coping skills.   Plan to continue to meet every two weeks.  Pt is progressing toward treatment goals.    Treatment Plan (Treatment Plan Target Date: 02/12/2023) Client Abilities/Strengths  Pt is bright, engaging, and motivated for therapy.  Client Treatment Preferences  Individual therapy.  Client Statement of Needs  Improve copings skills and have a safe place to talk about feelings.   Symptoms  Depressed or irritable mood. Excessive and/or unrealistic worry that is difficult to control occurring more days than not for at least 6 months about a number of events or activities. Hypervigilance (e.g., feeling constantly on edge, experiencing concentration difficulties, having trouble falling or staying asleep, exhibiting a general state of irritability).  Problems Addressed  Unipolar Depression, Anxiety Goals 1. Alleviate depressive symptoms and return to previous level of effective functioning. 2. Appropriately grieve the loss in order to normalize mood and to return to previously adaptive level of functioning. Objective Learn and implement behavioral strategies to overcome  depression. Target Date: 2023-02-12 Frequency: Biweekly  Progress: 10 Modality: individual  Related Interventions Assist the client in developing skills that increase the likelihood of deriving pleasure from behavioral activation (e.g., assertiveness skills, developing an exercise plan, less internal/more external focus, increased social involvement); reinforce  success. Engage the client in "behavioral activation," increasing his/her activity level and contact with sources of reward, while identifying processes that inhibit activation. use behavioral techniques such as instruction, rehearsal, role-playing, role reversal, as needed, to facilitate activity in the client's daily life; reinforce success. 3. Develop healthy interpersonal relationships that lead to the alleviation and help prevent the relapse of depression. 4. Develop healthy thinking patterns and beliefs about self, others, and the world that lead to the alleviation and help prevent the relapse of depression. 5. Enhance ability to effectively cope with the full variety of life's worries and anxieties. 6. Learn and implement coping skills that result in a reduction of anxiety and worry, and improved daily functioning. Objective Learn and implement problem-solving strategies for realistically addressing worries. Target Date: 2023-02-12 Frequency: Biweekly  Progress: 10 Modality: individual  Related Interventions Assign the client a homework exercise in which he/she problem-solves a current problem (see Mastery of Your Anxiety and Worry: Workbook by Elenora Fender and Filbert Schilder or Generalized Anxiety Disorder by Elesa Hacker, and Filbert Schilder); review, reinforce success, and provide corrective feedback toward improvement. Teach the client problem-solving strategies involving specifically defining a problem, generating options for addressing it, evaluating the pros and cons of each option, selecting and implementing an optional action, and reevaluating and refining the action. Objective Learn and implement calming skills to reduce overall anxiety and manage anxiety symptoms. Target Date: 2023-02-12 Frequency: Biweekly  Progress: 10 Modality: individual  Related Interventions Assign the client to read about progressive muscle relaxation and other calming strategies in relevant books or treatment manuals (e.g.,  Progressive Relaxation Training by Twana First; Mastery of Your Anxiety and Worry: Workbook by Earlie Counts). Assign the client homework each session in which he/she practices relaxation exercises daily, gradually applying them progressively from non-anxiety-provoking to anxiety-provoking situations; review and reinforce success while providing corrective feedback toward improvement. Teach the client calming/relaxation skills (e.g., applied relaxation, progressive muscle relaxation, cue controlled relaxation; mindful breathing; biofeedback) and how to discriminate better between relaxation and tension; teach the client how to apply these skills to his/her daily life. 7. Recognize, accept, and cope with feelings of depression. 8. Reduce overall frequency, intensity, and duration of the anxiety so that daily functioning is not impaired. 9. Resolve the core conflict that is the source of anxiety. 10. Stabilize anxiety level while increasing ability to function on a daily basis. Diagnosis Axis none F33.1 Major Depressive Disorder  Axis none F41.1 General Anxiety Disorder  Conditions For Discharge Achievement of treatment goals and objectives   Salomon Fick, LCSW

## 2022-06-27 ENCOUNTER — Emergency Department (INDEPENDENT_AMBULATORY_CARE_PROVIDER_SITE_OTHER)
Admission: EM | Admit: 2022-06-27 | Discharge: 2022-06-27 | Disposition: A | Discharge status: Home / Self Care | Payer: Medicare Other | Source: Home / Self Care | Attending: Family Medicine | Admitting: Family Medicine

## 2022-06-27 ENCOUNTER — Emergency Department (INDEPENDENT_AMBULATORY_CARE_PROVIDER_SITE_OTHER): Payer: Medicare Other

## 2022-06-27 DIAGNOSIS — M25532 Pain in left wrist: Secondary | ICD-10-CM

## 2022-06-27 DIAGNOSIS — S63502A Unspecified sprain of left wrist, initial encounter: Secondary | ICD-10-CM

## 2022-06-27 DIAGNOSIS — R030 Elevated blood-pressure reading, without diagnosis of hypertension: Secondary | ICD-10-CM

## 2022-06-27 DIAGNOSIS — W19XXXA Unspecified fall, initial encounter: Secondary | ICD-10-CM | POA: Diagnosis not present

## 2022-06-27 NOTE — ED Triage Notes (Signed)
Pt presents with a left wrist injury after a fall that occurred on Monday night. Pt states she has been taking Advil for comfort q6h.

## 2022-06-27 NOTE — Discharge Instructions (Addendum)
Wear brace at all times May remove to bathe See Dr Dianah Field if you have persistent pain.  He may repeat x rays if this doesn't improve Take ibuprofen as needed pain, 600 - 800 mg three times a day See your PCP about BP

## 2022-06-27 NOTE — ED Provider Notes (Signed)
[facility]    CSN: [id number] Arrival date & time: 06/27/22  1138      History   Chief Complaint Chief Complaint  Patient presents with   Wrist Pain    left    HPI Desiree Wilson is a 56 y.o. female.   HPI Fell on outstretched hand and injured wrist 2-3 days ago Still swollen and painful  Past Medical History:  Diagnosis Date   Allergy    rhinitis   Anemia    Anxiety    Blood transfusion    x2 1983 surgery blood loss   Blood transfusion without reported diagnosis    Depression    Depression    Facial paralysis    right face/ chronic pain syndrome (pain management Dr Sydell Axon)   RFFMBWGY(659.9)    Hyperlipidemia    Hypotension    Iron deficiency anemia 11/15/2014   Iron malabsorption 11/15/2014   Osteoarthritis    Trigeminal neuralgia    right   Trigeminal neuralgia 2002    Patient Active Problem List   Diagnosis Date Noted   Low libido 02/14/2022   Vaginal atrophy 02/14/2022   Diarrhea 08/11/2021   Prediabetes 05/30/2021   Low serum ferritin level 05/30/2021   B12 deficiency 05/30/2021   Chronic pain syndrome 05/30/2021   Recurrent major depressive disorder, in partial remission ([place]) 05/30/2021   Class 2 severe obesity with serious comorbidity and body mass index (BMI) of 36.0 to 36.9 in adult ([place]) 05/30/2021   Primary insomnia 02/02/2021   Snoring 10/26/2020   BMI 38.0-38.9,adult 10/26/2020   Menorrhagia 06/10/2020   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   Chronic diarrhea 07/15/2017   Deafness in right ear 07/12/2016   Gastroesophageal reflux disease with esophagitis 06/11/2016   Vitamin D deficiency 06/06/2016   Myofascial pain 02/29/2016   Abnormal brain MRI 07/18/2015   Chronic insomnia 12/16/2014   Iron deficiency anemia 11/15/2014   Iron malabsorption 11/15/2014   CKD (chronic kidney disease) stage 3, GFR 30-59 ml/min (HCC) 08/26/2014   Chronic migraine without aura, intractable, without status migrainosus 01/25/2014    Trigeminal neuralgia 07/22/2012   ANKLE PAIN, LEFT 03/06/2011   FATIGUE 08/10/2010   HYPERSOMNIA 08/09/2009   Hyperlipidemia 07/05/2009   Depression 07/05/2009   ALLERGIC RHINITIS 07/05/2009   OSTEOARTHRITIS 07/05/2009   HEADACHE 07/05/2009    Past Surgical History:  Procedure Laterality Date   COLONOSCOPY     ESOPHAGOGASTRODUODENOSCOPY     gamma knife Right 01/2018   MANDIBLE SURGERY     NASAL SINUS SURGERY     OSTEOTOMY     plastic facial surgery     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    OB History     Gravida  0   Para  0   Term  0   Preterm  0   AB  0   Living  0      SAB  0   IAB  0   Ectopic  0   Multiple  0   Live Births  0            Home Medications    Prior to Admission medications   Medication Sig Start Date End Date Taking? Authorizing Provider  AMBULATORY NON FORMULARY MEDICATION Ketamine nose spray: use 2 sprays prn pain, up to 10 sprays a day    [provider]  Betamethasone Valerate 0.12 % foam Apply 1 application topically 2 (two) times daily. To scalp area 02/14/22  Hali Marry, MD  Carboxymethylcellulose Sodium 1 % GEL Apply to eye as needed.    [provider]  clonazePAM (KLONOPIN) 1 MG tablet Take 1 tablet (1 mg total) by mouth 2 (two) times daily. 06/11/11   Bowen, Collene Leyden, DO  colestipol (COLESTID) 1 g tablet Take 1 tablet (1 g total) by mouth 2 (two) times daily. 03/02/22   Armbruster, Carlota Raspberry, MD  dicyclomine (BENTYL) 10 MG capsule Take 1 capsule (10 mg total) by mouth every 8 (eight) hours as needed for spasms. 03/02/22   Armbruster, Carlota Raspberry, MD  DULoxetine (CYMBALTA) 60 MG capsule Take 1 capsule (60 mg total) by mouth daily. 05/15/22   Hali Marry, MD  estradiol (ESTRACE) 0.1 MG/GM vaginal cream Place 1 Applicatorful vaginally at bedtime. X 7 days, then 3 x a week 02/14/22   Hali Marry, MD  ferrous fumarate (HEMOCYTE - 106 MG FE) 325 (106 Fe)  MG TABS tablet Take 1 tablet by mouth every other day.    [provider]  Fluocinolone Acetonide Scalp 0.01 % OIL Apply 1 application topically at bedtime as needed. 02/15/22   Hali Marry, MD  fluticasone (FLONASE) 50 MCG/ACT nasal spray Place 2 sprays into both nostrils daily. 05/03/22   Hali Marry, MD  folic acid (FOLVITE) 1 MG tablet Take 1 mg by mouth daily.    [provider]  frovatriptan (FROVA) 2.5 MG tablet Take 2.5 mg by mouth daily as needed.    [provider]  gabapentin (NEURONTIN) 600 MG tablet Take 600 mg by mouth 3 (three) times daily. 04/26/20   [provider]  IBUPROFEN PO Take by mouth as needed.    [provider]  ketamine (KETALAR) 10 MG/ML injection Inject into the vein every 3 (three) months.     [provider]  loperamide (IMODIUM A-D) 2 MG tablet Take 1 tablet (2 mg total) by mouth in the morning. May take again later in the day as needed 03/02/22   Armbruster, Carlota Raspberry, MD  Multiple Vitamin (MULTIVITAMIN) capsule Take 1 capsule by mouth daily.    [provider]  omeprazole (PRILOSEC) 40 MG capsule TAKE 1 CAPSULE BY MOUTH DAILY 05/08/22   Hali Marry, MD  Oxcarbazepine (TRILEPTAL) 300 MG tablet Take 300 mg by mouth 2 (two) times daily. 02/15/21   [provider]  Probiotic Product (PROBIOTIC PO) Take by mouth.    [provider]  promethazine (PHENERGAN) 25 MG tablet Take 1 tablet (25 mg total) by mouth every 8 (eight) hours as needed for nausea or vomiting. 05/06/19   Hali Marry, MD  Pseudoephedrine-Acetaminophen (SINUS PO) Take by mouth as needed.    [provider]  rosuvastatin (CRESTOR) 20 MG tablet Take 1 tablet (20 mg total) by mouth daily. 02/09/22   Hali Marry, MD  terbinafine (LAMISIL) 250 MG tablet Take 250 mg by mouth daily. 04/26/22   [provider]  tiZANidine (ZANAFLEX) 4 MG tablet Take 4 mg by mouth every 6 (six)  hours as needed. 01/28/15   [provider]  traZODone (DESYREL) 100 MG tablet TAKE 2 TABLETS(200 MG) BY MOUTH AT BEDTIME AS NEEDED FOR SLEEP 02/23/22   Hali Marry, MD  vitamin B-12 (CYANOCOBALAMIN) 1000 MCG tablet Take 1,000 mcg by mouth daily.    [provider]  vitamin C (ASCORBIC ACID) 500 MG tablet Take 500 mg by mouth daily.    [provider]  Vitamin D, Ergocalciferol, (  DRISDOL) 1.25 MG (50000 UNIT) CAPS capsule Take 1 capsule (50,000 Units total) by mouth every 7 (seven) days. 11/06/21   Briscoe Deutscher, DO    Family History Family History  Problem Relation Age of Onset   Breast cancer Mother        lumpectomy and radiation   Obesity Mother    Stroke Father    Diverticulitis Father    Hypertension Father    Hyperlipidemia Father    Depression Father    Alzheimer's disease Father    Colon cancer Neg Hx    Stomach cancer Neg Hx    Esophageal cancer Neg Hx    Pancreatic cancer Neg Hx     Social History Social History   Tobacco Use   Smoking status: Some Days    Years: 6.00    Types: Cigarettes    Last attempt to quit: 12/31/2000    Years since quitting: 21.5   Smokeless tobacco: Never   Tobacco comments:    Social smoker   Vaping Use   Vaping Use: Never used  Substance Use Topics   Alcohol use: No    Alcohol/week: 0.0 standard drinks of alcohol   Drug use: No     Allergies   Azithromycin, Methadone, Tapentadol, Phenobarbital, and Wellbutrin [bupropion]   Review of Systems Review of Systems  See HPI Physical Exam Triage Vital Signs ED Triage Vitals  Enc Vitals Group     BP 06/27/22 1228 (!) 167/109     Pulse Rate 06/27/22 1228 75     Resp 06/27/22 1228 16     Temp 06/27/22 1228 98 F (36.7 C)     Temp Source 06/27/22 1228 Oral     SpO2 06/27/22 1228 96 %     Weight --      Height --      Head Circumference --      Peak Flow --      Pain Score 06/27/22 1229 7     Pain Loc --      Pain Edu? --      Excl. in  [place]? --    No data found.  Updated Vital Signs BP (!) 167/109 (BP Location: Right Arm)   Pulse 75   Temp 98 F (36.7 C) (Oral)   Resp 16   SpO2 96%      Physical Exam Constitutional:      General: She is not in acute distress.    Appearance: She is well-developed.     Comments: Face asymmetry  HENT:     Head: Normocephalic and atraumatic.  Eyes:     Conjunctiva/sclera: Conjunctivae normal.     Pupils: Pupils are equal, round, and reactive to light.  Cardiovascular:     Rate and Rhythm: Normal rate.  Pulmonary:     Effort: Pulmonary effort is normal. No respiratory distress.  Abdominal:     General: There is no distension.     Palpations: Abdomen is soft.  Musculoskeletal:        General: Normal range of motion.     Cervical back: Normal range of motion.     Comments: Left wrist has mild STS and tenderness distal radius and snuffbox  Skin:    General: Skin is warm and dry.  Neurological:     Mental Status: She is alert.  Psychiatric:        Mood and Affect: Mood normal.        Behavior: Behavior normal.  UC Treatments / Results  Labs (all labs ordered are listed, but only abnormal results are displayed) Labs Reviewed - No data to display  EKG   Radiology DG Wrist Complete Left  Result Date: 06/27/2022 CLINICAL DATA:  56 year old female with fall and left wrist pain. EXAM: LEFT WRIST - COMPLETE 3+ VIEW COMPARISON:  None Available. FINDINGS: There is no evidence of fracture or dislocation. There is no evidence of arthropathy or other focal bone abnormality. Soft tissues are unremarkable. IMPRESSION: No acute fracture or malalignment. Electronically Signed   By: Ruthann Cancer M.D.   On: 06/27/2022 12:53    Procedures Procedures (including critical care time)  Medications Ordered in UC Medications - No data to display  Initial Impression / Assessment and Plan / UC Course  I have reviewed the triage vital signs and the nursing notes.  Pertinent labs &  imaging results that were available during my care of the patient were reviewed by me and considered in my medical decision making (see chart for details).     Brace.  Ice.  OTC meds. Final Clinical Impressions(s) / UC Diagnoses   Final diagnoses:  Sprain of left wrist, initial encounter  Acute pain of left wrist     Discharge Instructions      Wear brace at all times May remove to bathe See Dr Dianah Field if you have persistent pain.  He may repeat x rays if this doesn't improve Take ibuprofen as needed pain, 600 - 800 mg three times a day    ED Prescriptions   None    PDMP not reviewed this encounter.   Raylene Everts, MD 06/27/22 1356

## 2022-07-10 DIAGNOSIS — G501 Atypical facial pain: Secondary | ICD-10-CM | POA: Diagnosis not present

## 2022-07-10 DIAGNOSIS — F419 Anxiety disorder, unspecified: Secondary | ICD-10-CM | POA: Diagnosis not present

## 2022-07-10 DIAGNOSIS — Z79899 Other long term (current) drug therapy: Secondary | ICD-10-CM | POA: Diagnosis not present

## 2022-07-10 DIAGNOSIS — G500 Trigeminal neuralgia: Secondary | ICD-10-CM | POA: Diagnosis not present

## 2022-07-18 DIAGNOSIS — N898 Other specified noninflammatory disorders of vagina: Secondary | ICD-10-CM | POA: Diagnosis not present

## 2022-07-18 DIAGNOSIS — N952 Postmenopausal atrophic vaginitis: Secondary | ICD-10-CM | POA: Diagnosis not present

## 2022-07-18 DIAGNOSIS — R102 Pelvic and perineal pain: Secondary | ICD-10-CM | POA: Diagnosis not present

## 2022-07-25 ENCOUNTER — Ambulatory Visit (INDEPENDENT_AMBULATORY_CARE_PROVIDER_SITE_OTHER): Payer: Medicare Other

## 2022-07-25 ENCOUNTER — Ambulatory Visit: Payer: Medicare Other | Admitting: Gastroenterology

## 2022-07-25 ENCOUNTER — Ambulatory Visit (INDEPENDENT_AMBULATORY_CARE_PROVIDER_SITE_OTHER): Payer: Medicare Other | Admitting: Sports Medicine

## 2022-07-25 DIAGNOSIS — M654 Radial styloid tenosynovitis [de Quervain]: Secondary | ICD-10-CM

## 2022-07-25 NOTE — Progress Notes (Signed)
    Procedures performed today:    Procedure: Real-time Ultrasound Guided injection of the left first extensor compartment Device: Samsung HS60  Verbal informed consent obtained.  Time-out conducted.  Noted no overlying erythema, induration, or other signs of local infection.  Skin prepped in a sterile fashion.  Local anesthesia: Topical Ethyl chloride.  With sterile technique and under real time ultrasound guidance: Noted tendon sheath effusion, 1 cc lidocaine, 1 cc bupivacaine, 1 cc kenalog 40 injected easily. Completed without difficulty  Advised to call if fevers/chills, erythema, induration, drainage, or persistent bleeding.  Images permanently stored and available for review in PACS.  Impression: Technically successful ultrasound guided injection.  Independent interpretation of notes and tests performed by another provider:   None.  Brief History, Exam, Impression, and Recommendations:    De Quervain's tenosynovitis, left This is a very pleasant 56 year old female, she had a fall about a month ago, ended up with some persistent pain left wrist over the radial aspect. Denies any pain prior to the fall. Was seen in urgent care, she was given a thumb spica brace, x-rays were obtained, and she is referred to me for further evaluation and definitive treatment. She continues to have pain after a month of immobilization, she is taking NSAIDs. She does take gabapentin as well and works with a chronic pain doctor. On exam she has tenderness over the radial styloid process and a positive Finkelstein sign all consistent with a de Quervain's tendinitis, due to failure of conservative treatment and immobilization we did a first extensor compartment injection today, we will add home physical therapy and she can return to see me in about 6 weeks.    ____________________________________________ Gwen Her. Dianah Field, M.D., ABFM., CAQSM., AME. Primary Care and Sports Medicine [place]  [facility]  Adjunct Professor of [employer]  Risk manager

## 2022-07-25 NOTE — Assessment & Plan Note (Signed)
This is a very pleasant 56 year old female, she had a fall about a month ago, ended up with some persistent pain left wrist over the radial aspect. Denies any pain prior to the fall. Was seen in urgent care, she was given a thumb spica brace, x-rays were obtained, and she is referred to me for further evaluation and definitive treatment. She continues to have pain after a month of immobilization, she is taking NSAIDs. She does take gabapentin as well and works with a chronic pain doctor. On exam she has tenderness over the radial styloid process and a positive Finkelstein sign all consistent with a de Quervain's tendinitis, due to failure of conservative treatment and immobilization we did a first extensor compartment injection today, we will add home physical therapy and she can return to see me in about 6 weeks.

## 2022-07-25 NOTE — Progress Notes (Deleted)
HPI :   56 year old female here for a follow-up visit for chronic diarrhea, GERD, B12 deficiency, iron deficiency.   Recall the patient has been dealing with ongoing loose stools with urgency for several months now, since this past summer.  See prior clinic notes for details of her case.  She has tested negative for celiac disease, had a negative infectious work-up, had an EGD and colonoscopy with me in October 2022, results as below.  Tested negative for microscopic colitis and celiac as well as H. pylori.  No clear cause for her vitamin deficiencies.   At her last visit in November we discussed trying a low FODMAP diet, discussed using Imodium as needed and start her on Colestid 1 g twice daily.  Her anemia resolved on iron supplementation.  We discussed capsule cystoscopy but decided to hold off on that and await her course.  On Colestid she states she is doing much better, upwards of 60% improved.  Her bowel movement frequency has decreased anywhere to 1-4 times per day, stools are soft formed, not as loose.  She has occasional urgency and not nearly as bad.  No nocturnal symptoms.  Generally pretty happy with her response but not yet resolved.  She takes an Imodium 1 to 2 pills perhaps 2 days a week.  She is seeing a weight loss specialist and trying to modify her diet to lose weight but at the same time hard to do that on a low FODMAP diet as well in regards to eating fruits such as apples etc.  She has continued to take her iron and B12 supplement.  She is taking her omeprazole for reflux and it is working pretty well for her.  She does have decreased energy at baseline.  She has not had a menstrual cycle in the past 10 years.  She is on chronic ketamine nasal spray and infusions for pain.  She was recently placed on doxycycline, was previously on Zoloft which was held.   Generally doing better but we discussed her options moving forward in regards to how aggressive she wants to be treated for her  symptoms as she is on other medications, and in regards to other work-up for her history of vitamin deficiencies.     Colonoscopy 12/07/2016: - Non-thrombosed external hemorrhoids found on perianal exam. - One 3 mm polyp at the ileocecal valve, removed with a cold biopsy forceps. Resected and retrieved. - One 6 mm polyp at the hepatic flexure, removed with a cold snare. Resected and retrieved. - One 5 mm polyp in the descending colon, removed with a cold snare. Resected and retrieved. - Internal hemorrhoids. - 5 year colonoscopy recall - The examination was otherwise normal. No inflammatory changes - Retroflexion not performed due to small size of rectum. - Biopsies were taken with a cold forceps from the right colon   1. Surgical [P], ileocecal valve, hepatic flexure, descending, polyp (3) -TUBULAR ADENOMAS AND BENIGN COLONIC MUCOSA. -NO HIGH GRADE DYSPLASIA OR MALIGNANCY IDENTIFIED. 2. Surgical [P], random right colon -BENIGN COLONIC MUCOSA WITH NO HISTOPATHOLOGIC ABNORMALITY. -NO EVIDENCE OF LYMPHOCYTIC OR COLLAGENOUS COLITIS, INFLAMMATORY BOWEL DISEASE OR MALIGNANCY     CT abdomen / pelvis without contrast 07/28/21 - IMPRESSION: No acute findings or other significant abnormality.   GI pathogen panel negative Hgb 13.6, WBC 11.4 CRP 4.0 TTG and IgA normal   EGD 10/12/21 -  - A 3 cm hiatal hernia was present. - The exam of the esophagus was otherwise normal. No Barrett's.  No focal stenosis / stricture. - A few small sessile polyps were found in the gastric body. Biopsies were taken with a cold forceps for histology as a representative sample to rule out adenomatous change. - Diffuse mildly erythematous mucosa was found in the gastric antrum. - The exam of the stomach was otherwise normal. - Biopsies were taken with a cold forceps in the gastric body, at the incisura and in the gastric antrum for Helicobacter pylori testing. - Patchy mildly erythematous mucosa was found in the  second portion of the duodenum. - The exam of the duodenum was otherwise normal. - Biopsies for histology were taken with a cold forceps in the duodenal bulb and in the second portion of the duodenum for evaluation of celiac disease.   Colonoscopy 10/12/21 - The perianal and digital rectal examinations were normal. - The terminal ileum appeared normal. - Two sessile polyps were found in the ascending colon. The polyps were 3 to 4 mm in size. These polyps were removed with a cold snare. Resection and retrieval were complete. - A single medium-mouthed diverticulum was found in the ascending colon. - Internal hemorrhoids were found during retroflexion. The hemorrhoids were small. - The exam was otherwise without abnormality. Several minutes spent lavaging the colon to achieve adequate views. - Biopsies for histology were taken with a cold forceps from the right colon, left colon and transverse colon for evaluation of microscopic colitis.   Diagnosis 1. Surgical [P], small bowel - DUODENAL MUCOSA WITH FOCAL EROSION AND MILD ASSOCIATED INFLAMMATION. - NO FEATURES OF CELIAC SPRUE OR GRANULOMAS. 2. Surgical [P], gastric antrum and gastric body - ANTRAL AND OXYNTIC MUCOSA WITH MILD CHANGES OF REACTIVE GASTROPATHY. [name] NEGATIVE FOR HELICOBACTER PYLORI. - NO INTESTINAL METAPLASIA, DYSPLASIA OR CARCINOMA. 3. Surgical [P], gastric polyps - FUNDIC GLAND POLYP. - NO INTESTINAL METAPLASIA, ADENOMATOUS CHANGE OR CARCINOMA. 4. Surgical [P], colon, ascending, polyp (2) - TUBULAR ADENOMA(S). - NO HIGH GRADE DYSPLASIA OR CARCINOMA. - INFLAMMATORY POLYP. 5. Surgical [P], random sites, left colon - UNREMARKABLE COLONIC MUCOSA. - NO MICROSCOPIC COLITIS, ACTIVE INFLAMMATION OR CHRONIC CHANGES. [name]     56 year old female here for reassessment of the following:   Chronic diarrhea Iron deficiency anemia B12 deficiency GERD History of colon polyps   Work-up as outlined above.   EGD and colonoscopy failed to show a clear cause for her symptoms and her iron deficiency/B12 deficiency, perhaps B12 deficiency due to PPI use.  On supplemental iron her reported iron deficiency although she has never really been anemic.  Since of last seen her we put her on Colestid twice daily and Imodium as needed and she is doing much better on that regimen, still some mild symptoms that bother her forever.  We discussed options.  I like to repeat her CBC with iron studies and B12 to make sure things are stable.  If she has iron deficiency despite supplemental oral iron then would recommend capsule endoscopy to clear her small bowel.  She previously had a CT scan of her abdomen last year with the symptoms and her small bowel looked normal.  I reassured her I do not think were missing anything concerning however if her loose stools persist and persistent IDA then we may want to consider capsule study.  She wants to think about this and await her labs first before making a decision.   We reviewed her medication list.  She can try taking Imodium once every morning and titrate up as needed if she wants  to reduce her stool frequency a bit.  Alternatively she could increase her Colestid to 2 tabs twice daily if needed.  For occasional abdominal cramping also provided Bentyl 10 mg every 8 hours which also may help her loose stools as well.  Again may consider capsule endoscopy pending her course and lab results.  She will follow-up in 3 months for reassessment.  She agrees   Plan: - lab for CBC, TIBC / ferritin, B12 level - Immodium 1 tab every AM and titrate up as needed - continue colestid 1gm BID - 3 month supply with 3 refills, she can increase to 2 tabs BID if needed - start Bentyl '10mg'$  every 8 hours PRN #30 RF3 - considering capsule endoscopy - will await blood work first, if symptoms persist despite change in regimen she may also consider it in that setting. - f/u 3 months   I spent 40 minutes of  time, including in depth chart review, face-to-face time with the patient, coordinating care, and documenting this encounter.           Past Medical History:  Diagnosis Date   Allergy    rhinitis   Anemia    Anxiety    Blood transfusion    x2 1983 surgery blood loss   Blood transfusion without reported diagnosis    Depression    Depression    Facial paralysis    right face/ chronic pain syndrome (pain management Dr Sydell Axon)   DDUKGURK(270.6)    Hyperlipidemia    Hypotension    Iron deficiency anemia 11/15/2014   Iron malabsorption 11/15/2014   Osteoarthritis    Trigeminal neuralgia    right   Trigeminal neuralgia 2002     Past Surgical History:  Procedure Laterality Date   COLONOSCOPY     ESOPHAGOGASTRODUODENOSCOPY     gamma knife Right 01/2018   MANDIBLE SURGERY     NASAL SINUS SURGERY     OSTEOTOMY     plastic facial surgery     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   Family History  Problem Relation Age of Onset   Breast cancer Mother        lumpectomy and radiation   Obesity Mother    Stroke Father    Diverticulitis Father    Hypertension Father    Hyperlipidemia Father    Depression Father    Alzheimer's disease Father    Colon cancer Neg Hx    Stomach cancer Neg Hx    Esophageal cancer Neg Hx    Pancreatic cancer Neg Hx    Social History   Tobacco Use   Smoking status: Some Days    Years: 6.00    Types: Cigarettes    Last attempt to quit: 12/31/2000    Years since quitting: 21.5   Smokeless tobacco: Never   Tobacco comments:    Social smoker   Vaping Use   Vaping Use: Never used  Substance Use Topics   Alcohol use: No    Alcohol/week: 0.0 standard drinks of alcohol   Drug use: No   Current Outpatient Medications  Medication Sig Dispense Refill   AMBULATORY NON FORMULARY MEDICATION Ketamine nose spray: use 2 sprays prn pain, up to 10 sprays a day     Betamethasone Valerate 0.12 % foam Apply 1  application topically 2 (two) times daily. To scalp area 100 g 1   Carboxymethylcellulose Sodium 1 % GEL Apply to eye as needed.     clonazePAM (  KLONOPIN) 1 MG tablet Take 1 tablet (1 mg total) by mouth 2 (two) times daily. 30 tablet 2   colestipol (COLESTID) 1 g tablet Take 1 tablet (1 g total) by mouth 2 (two) times daily. 180 tablet 3   dicyclomine (BENTYL) 10 MG capsule Take 1 capsule (10 mg total) by mouth every 8 (eight) hours as needed for spasms. 30 capsule 3   DULoxetine (CYMBALTA) 60 MG capsule Take 1 capsule (60 mg total) by mouth daily. 90 capsule 1   estradiol (ESTRACE) 0.1 MG/GM vaginal cream Place 1 Applicatorful vaginally at bedtime. X 7 days, then 3 x a week 85 g 3   ferrous fumarate (HEMOCYTE - 106 MG FE) 325 (106 Fe) MG TABS tablet Take 1 tablet by mouth every other day.     Fluocinolone Acetonide Scalp 0.01 % OIL Apply 1 application topically at bedtime as needed. 119 mL 1   fluticasone (FLONASE) 50 MCG/ACT nasal spray Place 2 sprays into both nostrils daily. 48 g 3   folic acid (FOLVITE) 1 MG tablet Take 1 mg by mouth daily.     frovatriptan (FROVA) 2.5 MG tablet Take 2.5 mg by mouth daily as needed.     gabapentin (NEURONTIN) 600 MG tablet Take 600 mg by mouth 3 (three) times daily.     IBUPROFEN PO Take by mouth as needed.     ketamine (KETALAR) 10 MG/ML injection Inject into the vein every 3 (three) months.      loperamide (IMODIUM A-D) 2 MG tablet Take 1 tablet (2 mg total) by mouth in the morning. May take again later in the day as needed 30 tablet 0   Multiple Vitamin (MULTIVITAMIN) capsule Take 1 capsule by mouth daily.     omeprazole (PRILOSEC) 40 MG capsule TAKE 1 CAPSULE BY MOUTH DAILY 90 capsule 3   Oxcarbazepine (TRILEPTAL) 300 MG tablet Take 300 mg by mouth 2 (two) times daily.     Probiotic Product (PROBIOTIC PO) Take by mouth.     promethazine (PHENERGAN) 25 MG tablet Take 1 tablet (25 mg total) by mouth every 8 (eight) hours as needed for nausea or vomiting.  20 tablet 0   Pseudoephedrine-Acetaminophen (SINUS PO) Take by mouth as needed.     rosuvastatin (CRESTOR) 20 MG tablet Take 1 tablet (20 mg total) by mouth daily. 90 tablet 3   terbinafine (LAMISIL) 250 MG tablet Take 250 mg by mouth daily.     tiZANidine (ZANAFLEX) 4 MG tablet Take 4 mg by mouth every 6 (six) hours as needed.     traZODone (DESYREL) 100 MG tablet TAKE 2 TABLETS(200 MG) BY MOUTH AT BEDTIME AS NEEDED FOR SLEEP 180 tablet 1   vitamin B-12 (CYANOCOBALAMIN) 1000 MCG tablet Take 1,000 mcg by mouth daily.     vitamin C (ASCORBIC ACID) 500 MG tablet Take 500 mg by mouth daily.     Vitamin D, Ergocalciferol, (DRISDOL) 1.25 MG (50000 UNIT) CAPS capsule Take 1 capsule (50,000 Units total) by mouth every 7 (seven) days. 12 capsule 0   No current facility-administered medications for this visit.   Allergies  Allergen Reactions   Azithromycin Rash    REACTION: C Diff Other reaction(s): Other (See Comments), Other (See Comments) REACTION: C Diff REACTION: C Diff REACTION: C Diff    Methadone     Other reaction(s): Hallucinations, Other (See Comments), Other (See Comments) Hallucinations Hallucinations Hallucinations    Tapentadol Anxiety    Other reaction(s): Dizziness   Phenobarbital  REACTION: Excited   Wellbutrin [Bupropion] Other (See Comments)    Not effective.      Review of Systems: All systems reviewed and negative except where noted in HPI.    DG Wrist Complete Left  Result Date: 06/27/2022 CLINICAL DATA:  56 year old female with fall and left wrist pain. EXAM: LEFT WRIST - COMPLETE 3+ VIEW COMPARISON:  None Available. FINDINGS: There is no evidence of fracture or dislocation. There is no evidence of arthropathy or other focal bone abnormality. Soft tissues are unremarkable. IMPRESSION: No acute fracture or malalignment. Electronically Signed   By: Ruthann Cancer M.D.   On: 06/27/2022 12:53    Physical Exam: There were no vitals taken for this  visit. Constitutional: Pleasant,well-developed, ***female in no acute distress. HEENT: Normocephalic and atraumatic. Conjunctivae are normal. No scleral icterus. Neck supple.  Cardiovascular: Normal rate, regular rhythm.  Pulmonary/chest: Effort normal and breath sounds normal. No wheezing, rales or rhonchi. Abdominal: Soft, nondistended, nontender. Bowel sounds active throughout. There are no masses palpable. No hepatomegaly. Extremities: no edema Lymphadenopathy: No cervical adenopathy noted. Neurological: Alert and oriented to person place and time. Skin: Skin is warm and dry. No rashes noted. Psychiatric: Normal mood and affect. Behavior is normal.   ASSESSMENT AND PLAN:  Hali Marry, *

## 2022-07-31 ENCOUNTER — Ambulatory Visit: Payer: Medicare Other | Admitting: Psychology

## 2022-07-31 DIAGNOSIS — R102 Pelvic and perineal pain: Secondary | ICD-10-CM | POA: Diagnosis not present

## 2022-08-08 ENCOUNTER — Encounter (INDEPENDENT_AMBULATORY_CARE_PROVIDER_SITE_OTHER): Payer: Self-pay

## 2022-08-14 ENCOUNTER — Ambulatory Visit (INDEPENDENT_AMBULATORY_CARE_PROVIDER_SITE_OTHER): Payer: Medicare Other | Admitting: Psychology

## 2022-08-14 DIAGNOSIS — F331 Major depressive disorder, recurrent, moderate: Secondary | ICD-10-CM | POA: Diagnosis not present

## 2022-08-14 DIAGNOSIS — F411 Generalized anxiety disorder: Secondary | ICD-10-CM | POA: Diagnosis not present

## 2022-08-14 NOTE — Progress Notes (Signed)
[facility] Counselor/Therapist Progress Note  Patient ID: Desiree Wilson, MRN: [id number],    Date: 08/14/2022  Time Spent: 9:00am-9:45am   45 minutes   Treatment Type: Individual Therapy  Reported Symptoms: anxiety, pain  Mental Status Exam: Appearance:  Casual and Neat     Behavior: Appropriate  Motor: Normal  Speech/Language:  Normal Rate  Affect: Appropriate  Mood: normal  Thought process: normal  Thought content:   WNL  Sensory/Perceptual disturbances:   WNL  Orientation: oriented to person, place, time/date, and situation  Attention: Good  Concentration: Good  Memory: WNL  Fund of knowledge:  Good  Insight:   Good  Judgment:  Good  Impulse Control: Good   Risk Assessment: Danger to Self:  No Self-injurious Behavior: No Danger to Others: No Duty to Warn:no Physical Aggression / Violence:No  Access to Firearms a concern: No  Gang Involvement:No   Subjective:  Pt present for face-to-face individual therapy via video Webex.  Pt consents to telehealth video session due to COVID 19 pandemic. Location of pt: home Location of therapist: home office.  Pt talked about her health.  She states she has been "nonfunctional for the past 11 days bc of pain".   She has not been able to work or focus bc of the pain.  Pt talked about her pain.  She has extreme pain in her face and neck and head.  Addressed pt's pain and feelings.   Pt is being very hard on herself and expecting herself to push through the pain and feels "worthless".   Worked on coping strategies.    Worked on self care strategies. Provided supportive therapy.    Interventions: Cognitive Behavioral Therapy and Insight-Oriented  Diagnosis: F33.1 and F41.1   Plan of Care: Recommend ongoing therapy.   Pt participated with setting treatment goals.   Pt states her goals for therapy are to have a place to process her feelings.   Pt wants to improve coping skills.   Plan to continue to meet every  two weeks.  Pt is progressing toward treatment goals.    Treatment Plan (Treatment Plan Target Date: 02/12/2023) Client Abilities/Strengths  Pt is bright, engaging, and motivated for therapy.  Client Treatment Preferences  Individual therapy.  Client Statement of Needs  Improve copings skills and have a safe place to talk about feelings.   Symptoms  Depressed or irritable mood. Excessive and/or unrealistic worry that is difficult to control occurring more days than not for at least 6 months about a number of events or activities. Hypervigilance (e.g., feeling constantly on edge, experiencing concentration difficulties, having trouble falling or staying asleep, exhibiting a general state of irritability).  Problems Addressed  Unipolar Depression, Anxiety Goals 1. Alleviate depressive symptoms and return to previous level of effective functioning. 2. Appropriately grieve the loss in order to normalize mood and to return to previously adaptive level of functioning. Objective Learn and implement behavioral strategies to overcome depression. Target Date: 2023-02-12 Frequency: Biweekly  Progress: 10 Modality: individual  Related Interventions Assist the client in developing skills that increase the likelihood of deriving pleasure from behavioral activation (e.g., assertiveness skills, developing an exercise plan, less internal/more external focus, increased social involvement); reinforce success. Engage the client in "behavioral activation," increasing his/her activity level and contact with sources of reward, while identifying processes that inhibit activation. use behavioral techniques such as instruction, rehearsal, role-playing, role reversal, as needed, to facilitate activity in the client's daily life; reinforce success. 3. Develop healthy interpersonal relationships  that lead to the alleviation and help prevent the relapse of depression. 4. Develop healthy thinking patterns and beliefs about  self, others, and the world that lead to the alleviation and help prevent the relapse of depression. 5. Enhance ability to effectively cope with the full variety of life's worries and anxieties. 6. Learn and implement coping skills that result in a reduction of anxiety and worry, and improved daily functioning. Objective Learn and implement problem-solving strategies for realistically addressing worries. Target Date: 2023-02-12 Frequency: Biweekly  Progress: 10 Modality: individual  Related Interventions Assign the client a homework exercise in which he/she problem-solves a current problem (see Mastery of Your Anxiety and Worry: Workbook by Adora Fridge and Eliot Ford or Generalized Anxiety Disorder by Eather Colas, and Eliot Ford); review, reinforce success, and provide corrective feedback toward improvement. Teach the client problem-solving strategies involving specifically defining a problem, generating options for addressing it, evaluating the pros and cons of each option, selecting and implementing an optional action, and reevaluating and refining the action. Objective Learn and implement calming skills to reduce overall anxiety and manage anxiety symptoms. Target Date: 2023-02-12 Frequency: Biweekly  Progress: 10 Modality: individual  Related Interventions Assign the client to read about progressive muscle relaxation and other calming strategies in relevant books or treatment manuals (e.g., Progressive Relaxation Training by Leroy Kennedy; Mastery of Your Anxiety and Worry: Workbook by Beckie Busing). Assign the client homework each session in which he/she practices relaxation exercises daily, gradually applying them progressively from non-anxiety-provoking to anxiety-provoking situations; review and reinforce success while providing corrective feedback toward improvement. Teach the client calming/relaxation skills (e.g., applied relaxation, progressive muscle relaxation, cue controlled  relaxation; mindful breathing; biofeedback) and how to discriminate better between relaxation and tension; teach the client how to apply these skills to his/her daily life. 7. Recognize, accept, and cope with feelings of depression. 8. Reduce overall frequency, intensity, and duration of the anxiety so that daily functioning is not impaired. 9. Resolve the core conflict that is the source of anxiety. 10. Stabilize anxiety level while increasing ability to function on a daily basis. Diagnosis Axis none F33.1 Major Depressive Disorder  Axis none F41.1 General Anxiety Disorder  Conditions For Discharge Achievement of treatment goals and objectives   Clint Bolder, LCSW

## 2022-08-16 ENCOUNTER — Ambulatory Visit (INDEPENDENT_AMBULATORY_CARE_PROVIDER_SITE_OTHER): Payer: Medicare Other | Admitting: Pharmacist

## 2022-08-16 DIAGNOSIS — E7849 Other hyperlipidemia: Secondary | ICD-10-CM

## 2022-08-16 DIAGNOSIS — F3341 Major depressive disorder, recurrent, in partial remission: Secondary | ICD-10-CM

## 2022-08-16 DIAGNOSIS — G894 Chronic pain syndrome: Secondary | ICD-10-CM

## 2022-08-16 NOTE — Progress Notes (Signed)
Chronic Care Management Pharmacy Note  08/16/2022 Name:  Desiree Wilson MRN:  [id number] DOB:  February 15, 1966  Summary: addressed HLD mental health, chronic pain.   Has been bedridden for trigeminal neuralgia for 13 days and is not feeling well, but looks forward to her infusion this coming Wednesday. She also received toradol 67m BID from pain specialist to get her through this flare.  Recommendations/Changes made from today's visit: none, aside from trigeminal neuralgia flare, patient is doing well with medications.  Plan: f/u with pharmacist in 6-8 months  Subjective: Desiree JANSSENSis an 56y.Wilson. year old female who is a primary patient of Desiree Wilson, Wilson Kocher MD.  The CCM team was consulted for assistance with disease management and care coordination needs.    Engaged with patient by telephone for follow up visit in response to provider referral for pharmacy case management and/or care coordination services.   Consent to Services:  The patient was given information about Chronic Care Management services, agreed to services, and gave verbal consent prior to initiation of services.  Please see initial visit note for detailed documentation.   Patient Care Team: MHali Marry MD as PCP - General (Family Medicine) PElmarie Shiley MD as Consulting Physician (Nephrology) RLucia Bitter, MD as Consulting Physician (Pain Medicine) KDarius [facility] (Pharmacist)  Recent office visits:  08/11/21 Wilson NuttingDO - Seen for diarrhea - Labs ordered - No medication changes noted - No follow up noted  07/28/21 JIran PlanasPA - Seen for lower abdominal pain - Labs ordered - No medication changes noted - No follow up noted  07/17/21 CHali MarryMD - Seen for Need for Zostavax administration - discontinue Abilify and switch to Lybalvi 5-10 MG TABS 1 tablet daily - Follow up in 7 weeks  05/30/21 CHali MarryMD - Seen for Acute non-recurrent sinusitis -  Start amoxicillin-clavulanate (AUGMENTIN) 875-125 MG tablet, Take 1 tablet by mouth 2 (two) times daily - No follow up noted        Recent consult visits:  10/12/21 SYetta FlockMD - Gastroenterology - Seen for procedure for chronic diarrhea - No medication changes noted - No follow up noted  09/27/21 CBrendolyn Wilson- Gastroenterology - Seen for Iron deficiency anemia - Labs ordered - Started Florastor Probiotic twice a day - Order for colonoscopy - No follow up noted  09/20/21 TAbby PotashPA - CGeneseevisit, seen for diarrhea - No medication changes noted - Follow up in 3 weeks  09/05/21  [place]for Facial pain - No medication changes noted - Follow up in 12 weeks  08/30/21 EFitzhughvisit, seen for diarrhea - No medication changes noted - Follow up in 4 weeks  08/09/21 DOberlinvisit, seen for vitamin D deficiency - continue vitamin D 1.25 MG (50000 UNIT) CAPS capsule; Take 1 capsule (50,000 Units total) by mouth every 7 (seven) days. - Follow up in 3 weeks  07/19/21 EMidlandfor prediabetes - No medication changes noted - Follow up in 3 weeks    Hospital visits:  None in previous 6 months  Objective:  Lab Results  Component Value Date   CREATININE 1.09 09/27/2021   CREATININE 1.07 (H) 07/28/2021  CREATININE 0.94 05/11/2021    Lab Results  Component Value Date   HGBA1C 5.8 (H) 05/11/2021   Last diabetic Eye exam: No results found for: "HMDIABEYEEXA"  Last diabetic Foot exam: No results found for: "HMDIABFOOTEX"      Component Value Date/Time   CHOL 203 (H) 03/02/2021 0000   TRIG 245 (H) 03/02/2021 0000   HDL 76 03/02/2021 0000   CHOLHDL 2.7 03/02/2021 0000   VLDL 54 (H) 03/05/2017 0916   LDLCALC 93 03/02/2021 0000       Latest  Ref Rng & Units 09/27/2021    9:43 AM 07/28/2021    3:34 PM 05/11/2021   10:26 AM  Hepatic Function  Total Protein 6.0 - 8.3 g/dL 7.7  7.3  6.8   Albumin 3.5 - 5.2 g/dL 4.0   4.4   AST 0 - 37 U/L '12  21  20   ' ALT 0 - 35 U/L '9  18  17   ' Alk Phosphatase 39 - 117 U/L 86   86   Total Bilirubin 0.2 - 1.2 mg/dL 0.3  0.3  0.2     Lab Results  Component Value Date/Time   TSH 0.933 05/11/2021 10:26 AM   TSH 1.21 01/19/2020 10:47 AM   FREET4 0.92 05/11/2021 10:26 AM       Latest Ref Rng & Units 03/02/2022    9:58 AM 09/27/2021    9:43 AM 07/28/2021    3:34 PM  CBC  WBC 4.0 - 10.5 K/uL 9.6  11.4  12.2   Hemoglobin 12.0 - 15.0 g/dL 14.0  13.6  14.7   Hematocrit 36.0 - 46.0 % 41.7  41.5  46.1   Platelets 150.0 - 400.0 K/uL 160.0  191.0  234     Lab Results  Component Value Date/Time   VD25OH 18.9 (L) 05/11/2021 10:26 AM   VD25OH 13 (L) 05/14/2016 10:30 AM    Social History   Tobacco Use  Smoking Status Some Days   Years: 6.00   Types: Cigarettes   Last attempt to quit: 12/31/2000   Years since quitting: 21.6  Smokeless Tobacco Never  Tobacco Comments   Social smoker    BP Readings from Last 3 Encounters:  06/27/22 (!) 167/109  05/03/22 129/87  03/02/22 (!) 144/92   Pulse Readings from Last 3 Encounters:  06/27/22 75  05/03/22 93  03/02/22 90   Wt Readings from Last 3 Encounters:  05/03/22 202 lb (91.6 kg)  03/02/22 204 lb (92.5 kg)  02/14/22 202 lb (91.6 kg)    Assessment: Review of patient past medical history, allergies, medications, health status, including review of consultants reports, laboratory and other test data, was performed as part of comprehensive evaluation and provision of chronic care management services.   SDOH:  (Social Determinants of Health) assessments and interventions performed:    CCM Care Plan  Allergies  Allergen Reactions   Azithromycin Rash    REACTION: C Diff Other reaction(s): Other (See Comments), Other (See Comments) REACTION: C  Diff REACTION: C Diff REACTION: C Diff    Methadone     Other reaction(s): Hallucinations, Other (See Comments), Other (See Comments) Hallucinations Hallucinations Hallucinations    Tapentadol Anxiety    Other reaction(s): Dizziness   Phenobarbital     REACTION: Excited   Wellbutrin [Bupropion] Other (See Comments)    Not effective.     Medications Reviewed Today     Reviewed by Desiree Wilson, [facility] (Pharmacist) on 08/16/22 at 1052  Med List  Status: <None>   Medication Order Taking? Sig Documenting Provider Last Dose Status Informant  AMBULATORY NON FORMULARY MEDICATION [id number] Yes Ketamine nose spray: use 2 sprays prn pain, up to 10 sprays a day [provider] Taking Active   Carboxymethylcellulose Sodium 1 % GEL [id number] Yes Apply to eye as needed. [provider] Taking Active   clonazePAM (KLONOPIN) 1 MG tablet [id number] Yes Take 1 tablet (1 mg total) by mouth 2 (two) times daily. Bowen, Collene Leyden, DO Taking Active   colestipol (COLESTID) 1 g tablet [id number] Yes Take 1 tablet (1 g total) by mouth 2 (two) times daily. Wilson Flock, MD Taking Active   dicyclomine (BENTYL) 10 MG capsule [id number] Yes Take 1 capsule (10 mg total) by mouth every 8 (eight) hours as needed for spasms. Wilson Flock, MD Taking Active   DULoxetine (CYMBALTA) 60 MG capsule [id number] Yes Take 1 capsule (60 mg total) by mouth daily. Desiree Marry, MD Taking Active   estradiol (ESTRACE) 0.1 MG/GM vaginal cream [id number] Yes Place 1 Applicatorful vaginally at bedtime. X 7 days, then 3 x a week Desiree Marry, MD Taking Active   ferrous fumarate (HEMOCYTE - 106 MG FE) 325 (106 Fe) MG TABS tablet [id number] Yes Take 1 tablet by mouth every other day. [provider] Taking Active   fluticasone (FLONASE) 50 MCG/ACT nasal spray [id number] Yes Place 2 sprays into both nostrils daily. Desiree Marry, MD Taking Active   folic acid (FOLVITE) 1 MG tablet  [id number] Yes Take 1 mg by mouth daily. [provider] Taking Active   frovatriptan (FROVA) 2.5 MG tablet [id number] Yes Take 2.5 mg by mouth daily as needed. [provider] Taking Active   gabapentin (NEURONTIN) 600 MG tablet [id number] Yes Take 600 mg by mouth 3 (three) times daily. [provider] Taking Active   IBUPROFEN PO [id number] Yes Take by mouth as needed. [provider] Taking Active   ketamine (KETALAR) 10 MG/ML injection [id number] Yes Inject into the vein every 3 (three) months.  [provider] Taking Active            Med Note Maryruth Eve, TONYA L   Wed Apr 27, 2020 10:58 AM)    ketorolac (TORADOL) 10 MG tablet [id number] Yes Take 10 mg by mouth every 8 (eight) hours as needed for severe pain. [provider] Taking Active   loperamide (IMODIUM A-D) 2 MG tablet [id number] Yes Take 1 tablet (2 mg total) by mouth in the morning. May take again later in the day as needed Armbruster, Carlota Raspberry, MD Taking Active   Multiple Vitamin (MULTIVITAMIN) capsule [id number] Yes Take 1 capsule by mouth daily. [provider] Taking Active   omeprazole (PRILOSEC) 40 MG capsule [id number] Yes TAKE 1 CAPSULE BY MOUTH DAILY Desiree Marry, MD Taking Active   Oxcarbazepine (TRILEPTAL) 300 MG tablet [id number] Yes Take 300 mg by mouth 2 (two) times daily. [provider] Taking Active   Probiotic Product (PROBIOTIC PO) [id number] Yes Take by mouth. [provider] Taking Active   promethazine (PHENERGAN) 25 MG tablet [id number] Yes Take 1 tablet (25 mg total) by mouth every 8 (eight) hours as needed for nausea or vomiting. Desiree Marry, MD Taking Active   Pseudoephedrine-Acetaminophen (SINUS PO) [id number] Yes Take by mouth as needed. [provider] Taking Active   rosuvastatin (CRESTOR) 20 MG tablet [id number] Yes Take 1 tablet (20 mg total) by mouth daily. Desiree Marry, MD Taking Active  tiZANidine  (ZANAFLEX) 4 MG tablet [id number] Yes Take 4 mg by mouth every 6 (six) hours as needed. [provider] Taking Active            Med Note Teddy Spike   Wed Apr 27, 2020 10:58 AM)    traZODone (DESYREL) 100 MG tablet [id number] Yes TAKE 2 TABLETS(200 MG) BY MOUTH AT BEDTIME AS NEEDED FOR SLEEP Desiree Marry, MD Taking Active   vitamin B-12 (CYANOCOBALAMIN) 1000 MCG tablet [id number] Yes Take 1,000 mcg by mouth daily. [provider] Taking Active   vitamin C (ASCORBIC ACID) 500 MG tablet [id number] Yes Take 500 mg by mouth daily. [provider] Taking Active   Vitamin D, Ergocalciferol, (DRISDOL) 1.25 MG (50000 UNIT) CAPS capsule [id number] Yes Take 1 capsule (50,000 Units total) by mouth every 7 (seven) days. Briscoe Deutscher, DO Taking Active   Med List Note Desiree Marry, MD 01/14/17 1333):              Patient Active Problem List   Diagnosis Date Noted   De Quervain's tenosynovitis, left 07/25/2022   Low libido 02/14/2022   Vaginal atrophy 02/14/2022   Diarrhea 08/11/2021   Prediabetes 05/30/2021   Low serum ferritin level 05/30/2021   B12 deficiency 05/30/2021   Chronic pain syndrome 05/30/2021   Recurrent major depressive disorder, in partial remission ([place]) 05/30/2021   Class 2 severe obesity with serious comorbidity and body mass index (BMI) of 36.0 to 36.9 in adult [facility]) 05/30/2021   Primary insomnia 02/02/2021   Snoring 10/26/2020   BMI 38.0-38.9,adult 10/26/2020   Menorrhagia 06/10/2020   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   Chronic diarrhea 07/15/2017   Deafness in right ear 07/12/2016   Gastroesophageal reflux disease with esophagitis 06/11/2016   Vitamin D deficiency 06/06/2016   Myofascial pain 02/29/2016   Abnormal brain MRI 07/18/2015   Chronic insomnia 12/16/2014   Iron deficiency anemia 11/15/2014   Iron malabsorption 11/15/2014   CKD (chronic kidney disease) stage 3, GFR 30-59 ml/min (HCC) 08/26/2014   Chronic  migraine without aura, intractable, without status migrainosus 01/25/2014   Trigeminal neuralgia 07/22/2012   ANKLE PAIN, LEFT 03/06/2011   FATIGUE 08/10/2010   HYPERSOMNIA 08/09/2009   Hyperlipidemia 07/05/2009   Depression 07/05/2009   ALLERGIC RHINITIS 07/05/2009   OSTEOARTHRITIS 07/05/2009   HEADACHE 07/05/2009    Immunization History  Administered Date(s) Administered   Influenza Split 11/25/2005, 12/25/2006, 10/20/2007, 09/26/2008   Influenza,inj,Quad PF,6+ Mos 08/17/2014, 08/26/2015, 08/20/2016, 01/15/2018, 10/27/2018, 01/19/2020, 10/26/2020, 12/14/2021   Influenza-Unspecified 09/30/2013   PFIZER(Purple Top)SARS-COV-2 Vaccination 03/05/2020, 04/05/2020, 10/20/2020   Pfizer Covid-19 Vaccine Bivalent Booster 31yr & up 12/01/2021   Td 08/10/2010   Tdap 10/26/2020   Zoster Recombinat (Shingrix) 07/17/2021, 12/14/2021    Conditions to be addressed/monitored: HLD, Anxiety, Depression, and chronic pain  Care Plan : Medication Management  Updates made by KDarius Wilson RPH since 08/16/2022 12:00 AM     Problem: HLD, chronic pain, mental health      Long-Range Goal: Disease Progression Prevention   Start Date: 11/21/2021  Recent Progress: On track  Priority: High  Note:   Current Barriers:  None at present  Pharmacist Clinical Goal(s):  Over the next 180 days, patient will adhere to plan to optimize therapeutic regimen for chronic conditions as evidenced by report of adherence to recommended medication management changes through collaboration with PharmD and provider.   Interventions: 1:1 collaboration with MHali Marry MD regarding development and update of comprehensive plan of  care as evidenced by provider attestation and co-signature Inter-disciplinary care team collaboration (see longitudinal plan of care) Comprehensive medication review performed; medication list updated in electronic medical record  Hyperlipidemia:  Controlled; current  treatment:rosuvastatin 25m daily; LDL 93, TG 245  Recommended continue current regimen Depression/Anxiety:  Controlled; current treatment:klonopin PRN, duloxetine;   [facility] [place]  Recommended continue current regimen, and  Chronic Pain  Controlled; current treatment:ketamine infusions Q350month oxcarbazepine 30023mID, klonopin 1mg19mD PRN, tizanidine 4mg 38mry 6hr PRN; for breakthrough tries gabapentin first, then ketamine nasal spray  Recommended continue current regimen  Patient Goals/Self-Care Activities Over the next 180 days, patient will:  take medications as prescribed  Follow Up Plan: Telephone follow up appointment with care management team member scheduled for:  6-8 months      Medication Assistance: None required.  Patient affirms current coverage meets needs.  [id number] [place] [address] [id number]: [phone]- [id number]: [phone] pill box? No - has a makeup bag & keeps things together in this way Pt endorses 100% compliance  Follow Up:  Patient agrees to Care Plan and Follow-up.  Plan: Telephone follow up appointment with care management team member scheduled for:  6-8 month  KeeshLarinda ButteryrmD Clinical Pharmacist [phone]

## 2022-08-16 NOTE — Patient Instructions (Signed)
Visit Information  Thank you for taking time to visit with me today. Please don't hesitate to contact me if I can be of assistance to you before our next scheduled telephone appointment.  Following are the goals we discussed today:  Patient Goals/Self-Care Activities Over the next 180 days, patient will:  take medications as prescribed  Follow Up Plan: Telephone follow up appointment with care management team member scheduled for:  6-8 months  Please call the care guide team at [phone] if you need to cancel or reschedule your appointment.     Patient verbalizes understanding of instructions and care plan provided today and agrees to view in [place]. Active MyChart status and patient understanding of how to access instructions and care plan via MyChart confirmed with patient.     Desiree Wilson

## 2022-08-22 ENCOUNTER — Encounter: Payer: Self-pay | Admitting: General Practice

## 2022-08-23 ENCOUNTER — Ambulatory Visit: Payer: Medicare Other | Admitting: Gastroenterology

## 2022-08-28 ENCOUNTER — Ambulatory Visit: Payer: Medicare Other | Admitting: Psychology

## 2022-08-30 DIAGNOSIS — E785 Hyperlipidemia, unspecified: Secondary | ICD-10-CM

## 2022-08-30 DIAGNOSIS — F32A Depression, unspecified: Secondary | ICD-10-CM | POA: Diagnosis not present

## 2022-09-05 ENCOUNTER — Ambulatory Visit: Payer: Medicare Other | Admitting: Sports Medicine

## 2022-09-05 DIAGNOSIS — G500 Trigeminal neuralgia: Secondary | ICD-10-CM | POA: Diagnosis not present

## 2022-09-05 DIAGNOSIS — Z79899 Other long term (current) drug therapy: Secondary | ICD-10-CM | POA: Diagnosis not present

## 2022-09-05 DIAGNOSIS — G894 Chronic pain syndrome: Secondary | ICD-10-CM | POA: Diagnosis not present

## 2022-09-05 DIAGNOSIS — Z5181 Encounter for therapeutic drug level monitoring: Secondary | ICD-10-CM | POA: Diagnosis not present

## 2022-09-05 DIAGNOSIS — G501 Atypical facial pain: Secondary | ICD-10-CM | POA: Diagnosis not present

## 2022-09-10 ENCOUNTER — Ambulatory Visit: Payer: Medicare Other | Admitting: Sports Medicine

## 2022-09-21 ENCOUNTER — Ambulatory Visit (INDEPENDENT_AMBULATORY_CARE_PROVIDER_SITE_OTHER): Payer: Medicare Other | Admitting: Psychology

## 2022-09-21 DIAGNOSIS — F331 Major depressive disorder, recurrent, moderate: Secondary | ICD-10-CM | POA: Diagnosis not present

## 2022-09-21 DIAGNOSIS — F411 Generalized anxiety disorder: Secondary | ICD-10-CM

## 2022-09-21 NOTE — Progress Notes (Signed)
[place] Counselor/Therapist Progress Note  Patient ID: Desiree Wilson, MRN: [id number],    Date: 09/21/2022  Time Spent: 11:00am-11:50am    50 minutes   Treatment Type: Individual Therapy  Reported Symptoms: anxiety, pain  Mental Status Exam: Appearance:  Casual and Neat     Behavior: Appropriate  Motor: Normal  Speech/Language:  Normal Rate  Affect: Appropriate  Mood: normal  Thought process: normal  Thought content:   WNL  Sensory/Perceptual disturbances:   WNL  Orientation: oriented to person, place, time/date, and situation  Attention: Good  Concentration: Good  Memory: WNL  Fund of knowledge:  Good  Insight:   Good  Judgment:  Good  Impulse Control: Good   Risk Assessment: Danger to Self:  No Self-injurious Behavior: No Danger to Others: No Duty to Warn:no Physical Aggression / Violence:No  Access to Firearms a concern: No  Gang Involvement:No   Subjective:  Pt present for face-to-face individual therapy via video Webex.  Pt consents to telehealth video session due to COVID 19 pandemic. Location of pt: home Location of therapist: home office.  Pt talked about her health.  She went through a month of severe pain and had a very hard time.  Addressed how pt coped with that time.  She was very anxious about the pain she was having.   Worked on coping strategies.   Pt is planning to see a new doctor at [facility] who specializes in trigeminal nerve damage.   Pt had her wedding to [name] a couple of weeks ago.  Pt states it was a wonderful ceremony and celebration with friends.   Pt and [name] are very happy to be married and their relationship has been very good lately.   Tomorrow is the family wedding ceremony that she is preparing for.   She is hopeful that ceremony will go well as well.   Pt is in more pain today which is concerning for her.   Pt's boss gave pt and [name] $1,500.00 as a wedding gift.  Pt is very touched and grateful.  Pt feels very loved  and valued at work.   They are very supportive of pt's health issues.   Worked on self care strategies. Provided supportive therapy.    Interventions: Cognitive Behavioral Therapy and Insight-Oriented  Diagnosis: F33.1 and F41.1   Plan of Care: Recommend ongoing therapy.   Pt participated with setting treatment goals.   Pt states her goals for therapy are to have a place to process her feelings.   Pt wants to improve coping skills.   Plan to continue to meet every two weeks.  Pt is progressing toward treatment goals.    Treatment Plan (Treatment Plan Target Date: 02/12/2023) Client Abilities/Strengths  Pt is bright, engaging, and motivated for therapy.  Client Treatment Preferences  Individual therapy.  Client Statement of Needs  Improve copings skills and have a safe place to talk about feelings.   Symptoms  Depressed or irritable mood. Excessive and/or unrealistic worry that is difficult to control occurring more days than not for at least 6 months about a number of events or activities. Hypervigilance (e.g., feeling constantly on edge, experiencing concentration difficulties, having trouble falling or staying asleep, exhibiting a general state of irritability).  Problems Addressed  Unipolar Depression, Anxiety Goals 1. Alleviate depressive symptoms and return to previous level of effective functioning. 2. Appropriately grieve the loss in order to normalize mood and to return to previously adaptive level of functioning. Objective Learn and implement  behavioral strategies to overcome depression. Target Date: 2023-02-12 Frequency: Biweekly  Progress: 10 Modality: individual  Related Interventions Assist the client in developing skills that increase the likelihood of deriving pleasure from behavioral activation (e.g., assertiveness skills, developing an exercise plan, less internal/more external focus, increased social involvement); reinforce success. Engage the client in "behavioral  activation," increasing his/her activity level and contact with sources of reward, while identifying processes that inhibit activation. use behavioral techniques such as instruction, rehearsal, role-playing, role reversal, as needed, to facilitate activity in the client's daily life; reinforce success. 3. Develop healthy interpersonal relationships that lead to the alleviation and help prevent the relapse of depression. 4. Develop healthy thinking patterns and beliefs about self, others, and the world that lead to the alleviation and help prevent the relapse of depression. 5. Enhance ability to effectively cope with the full variety of life's worries and anxieties. 6. Learn and implement coping skills that result in a reduction of anxiety and worry, and improved daily functioning. Objective Learn and implement problem-solving strategies for realistically addressing worries. Target Date: 2023-02-12 Frequency: Biweekly  Progress: 10 Modality: individual  Related Interventions Assign the client a homework exercise in which he/she problem-solves a current problem (see Mastery of Your Anxiety and Worry: Workbook by Adora Fridge and Eliot Ford or Generalized Anxiety Disorder by Eather Colas, and Eliot Ford); review, reinforce success, and provide corrective feedback toward improvement. Teach the client problem-solving strategies involving specifically defining a problem, generating options for addressing it, evaluating the pros and cons of each option, selecting and implementing an optional action, and reevaluating and refining the action. Objective Learn and implement calming skills to reduce overall anxiety and manage anxiety symptoms. Target Date: 2023-02-12 Frequency: Biweekly  Progress: 10 Modality: individual  Related Interventions Assign the client to read about progressive muscle relaxation and other calming strategies in relevant books or treatment manuals (e.g., Progressive Relaxation Training by Leroy Kennedy; Mastery of Your Anxiety and Worry: Workbook by Beckie Busing). Assign the client homework each session in which he/she practices relaxation exercises daily, gradually applying them progressively from non-anxiety-provoking to anxiety-provoking situations; review and reinforce success while providing corrective feedback toward improvement. Teach the client calming/relaxation skills (e.g., applied relaxation, progressive muscle relaxation, cue controlled relaxation; mindful breathing; biofeedback) and how to discriminate better between relaxation and tension; teach the client how to apply these skills to his/her daily life. 7. Recognize, accept, and cope with feelings of depression. 8. Reduce overall frequency, intensity, and duration of the anxiety so that daily functioning is not impaired. 9. Resolve the core conflict that is the source of anxiety. 10. Stabilize anxiety level while increasing ability to function on a daily basis. Diagnosis Axis none F33.1 Major Depressive Disorder  Axis none F41.1 General Anxiety Disorder  Conditions For Discharge Achievement of treatment goals and objectives   Clint Bolder, LCSW

## 2022-09-26 ENCOUNTER — Encounter: Payer: Self-pay | Admitting: Family Medicine

## 2022-09-26 ENCOUNTER — Ambulatory Visit (INDEPENDENT_AMBULATORY_CARE_PROVIDER_SITE_OTHER): Payer: Medicare Other | Admitting: Family Medicine

## 2022-09-26 VITALS — BP 175/99 | HR 91 | Ht 65.0 in | Wt 195.0 lb

## 2022-09-26 DIAGNOSIS — Z23 Encounter for immunization: Secondary | ICD-10-CM | POA: Diagnosis not present

## 2022-09-26 DIAGNOSIS — G500 Trigeminal neuralgia: Secondary | ICD-10-CM | POA: Diagnosis not present

## 2022-09-26 DIAGNOSIS — F329 Major depressive disorder, single episode, unspecified: Secondary | ICD-10-CM

## 2022-09-26 DIAGNOSIS — Z6832 Body mass index (BMI) 32.0-32.9, adult: Secondary | ICD-10-CM

## 2022-09-26 DIAGNOSIS — E6609 Other obesity due to excess calories: Secondary | ICD-10-CM

## 2022-09-26 MED ORDER — DULOXETINE HCL 30 MG PO CPEP: 30.0000 mg | ORAL_CAPSULE | Freq: Every morning | ORAL | 2 refills | Status: DC

## 2022-09-26 MED ORDER — AMBULATORY NON FORMULARY MEDICATION: 11 refills | Status: AC

## 2022-09-26 NOTE — Assessment & Plan Note (Signed)
She has scheduled a consult at [facility] for the TM. She has an MRI schedule in Nov.  She would also like to consider opinion from someone in [place].

## 2022-09-26 NOTE — Assessment & Plan Note (Signed)
Doing well with her weight loss goals.

## 2022-09-26 NOTE — Assessment & Plan Note (Signed)
Discussed options. Most of the inc in sxs is from increased stress. She is really exeperiencing PTSD like symptoms.  Will add 30 mg in AM to her 60 mg at night.  Continue to work with her therapist. Hopefully short term addition.

## 2022-09-26 NOTE — Progress Notes (Signed)
Established Patient Office Visit  Subjective   Patient ID: Desiree Wilson, female    DOB: 1966/12/21  Age: 56 y.o. MRN: [id number]  Chief Complaint  Patient presents with   Depression    HPI  Feels like her pain is worse and that is triggering more depression. Her pain management is having a hard time getting venous access and getting her pain under control.  Currently on Cymbalta 60 mg.  So on clonazepam but this was prescribed by her pain management.  She reports feeling down several days a week and difficulty with sleep and energy and appetite.  She also reports some difficulty concentrating.  Still has difficulty relaxing and becomes easily irritable several days a week.  She feels like her symptoms are currently being triggered mostly by her uncontrolled pain.   Would like a recommendation on a probiotic for her chronic diarrhea.  Has tried one but not helpful.  .     ROS    Objective:     BP (!) 175/99   Pulse 91   Ht '5\' 5"'$  (1.651 m)   Wt 195 lb (88.5 kg)   SpO2 96%   BMI 32.45 kg/m    Physical Exam Vitals reviewed.  Constitutional:      Appearance: She is well-developed.  HENT:     Head: Normocephalic and atraumatic.  Eyes:     Conjunctiva/sclera: Conjunctivae normal.  Cardiovascular:     Rate and Rhythm: Normal rate.  Pulmonary:     Effort: Pulmonary effort is normal.  Skin:    General: Skin is dry.     Coloration: Skin is not pale.  Neurological:     Mental Status: She is alert and oriented to person, place, and time.  Psychiatric:        Behavior: Behavior normal.      No results found for any visits on 09/26/22.    The 10-year ASCVD risk score (Arnett DK, et al., 2019) is: 7.2%    Assessment & Plan:   Problem List Items Addressed This Visit       Nervous and Auditory   Trigeminal neuralgia    She has scheduled a consult at [facility] for the TM. She has an MRI schedule in Nov.  She would also like to consider opinion from someone in [place].         Relevant Medications   DULoxetine (CYMBALTA) 30 MG capsule     Other   Depression - Primary    Discussed options. Most of the inc in sxs is from increased stress. She is really exeperiencing PTSD like symptoms.  Will add 30 mg in AM to her 60 mg at night.  Continue to work with her therapist. Hopefully short term addition.       Relevant Medications   DULoxetine (CYMBALTA) 30 MG capsule   Class 1 obesity due to excess calories with body mass index (BMI) of 32.0 to 32.9 in adult    Doing well with her weight loss goals.       Other Visit Diagnoses     Need for immunization against influenza       Relevant Orders   Flu Vaccine QUAD 15moIM (Fluarix, Fluzone & Alfiuria Quad PF) (Completed)       Return in about 3 months (around 12/26/2022) for Mood.   I spent 30 minutes on the day of the encounter to include pre-visit record review, face-to-face time with the patient and post visit ordering of test.  [name], MD

## 2022-10-04 ENCOUNTER — Ambulatory Visit (INDEPENDENT_AMBULATORY_CARE_PROVIDER_SITE_OTHER): Payer: Medicare Other | Admitting: Psychology

## 2022-10-04 DIAGNOSIS — F411 Generalized anxiety disorder: Secondary | ICD-10-CM

## 2022-10-04 DIAGNOSIS — F331 Major depressive disorder, recurrent, moderate: Secondary | ICD-10-CM

## 2022-10-04 NOTE — Progress Notes (Signed)
[facility] Counselor/Therapist Progress Note  Patient ID: Desiree Wilson, MRN: [id number],    Date: 10/04/2022  Time Spent: 9:00am-9:50am    50 minutes   Treatment Type: Individual Therapy  Reported Symptoms: anxiety, pain  Mental Status Exam: Appearance:  Casual and Neat     Behavior: Appropriate  Motor: Normal  Speech/Language:  Normal Rate  Affect: Appropriate  Mood: normal  Thought process: normal  Thought content:   WNL  Sensory/Perceptual disturbances:   WNL  Orientation: oriented to person, place, time/date, and situation  Attention: Good  Concentration: Good  Memory: WNL  Fund of knowledge:  Good  Insight:   Good  Judgment:  Good  Impulse Control: Good   Risk Assessment: Danger to Self:  No Self-injurious Behavior: No Danger to Others: No Duty to Warn:no Physical Aggression / Violence:No  Access to Firearms a concern: No  Gang Involvement:No   Subjective:  Pt present for face-to-face individual therapy via video Webex.  Pt consents to telehealth video session due to COVID 19 pandemic. Location of pt: home Location of therapist: home office.  Pt talked about her family wedding ceremony.  The ceremony went well and pt enjoyed time with her family.  Pt's whole family attended which was a big show of support for pt.    Pt states that [name] is a lot more loving and attentive since their wedding.  They have had some conversations about eventually moving back to [employer].   Pt's mother had a talk with pt about pt needing to take over her own medical needs.  Pt is use to relying on her parents for support regarding her medical care.   Addressed pt's concerns and feelings.   Pt talked about her health.   Pt is having trouble accessing ketomine which she really needs for pain management.  Helped pt problem solve. Worked on self care strategies. Provided supportive therapy.    Interventions: Cognitive Behavioral Therapy and  Insight-Oriented  Diagnosis: F33.1 and F41.1   Plan of Care: Recommend ongoing therapy.   Pt participated with setting treatment goals.   Pt states her goals for therapy are to have a place to process her feelings.   Pt wants to improve coping skills.   Plan to continue to meet every two weeks.  Pt is progressing toward treatment goals.    Treatment Plan (Treatment Plan Target Date: 02/12/2023) Client Abilities/Strengths  Pt is bright, engaging, and motivated for therapy.  Client Treatment Preferences  Individual therapy.  Client Statement of Needs  Improve copings skills and have a safe place to talk about feelings.   Symptoms  Depressed or irritable mood. Excessive and/or unrealistic worry that is difficult to control occurring more days than not for at least 6 months about a number of events or activities. Hypervigilance (e.g., feeling constantly on edge, experiencing concentration difficulties, having trouble falling or staying asleep, exhibiting a general state of irritability).  Problems Addressed  Unipolar Depression, Anxiety Goals 1. Alleviate depressive symptoms and return to previous level of effective functioning. 2. Appropriately grieve the loss in order to normalize mood and to return to previously adaptive level of functioning. Objective Learn and implement behavioral strategies to overcome depression. Target Date: 2023-02-12 Frequency: Biweekly  Progress: 10 Modality: individual  Related Interventions Assist the client in developing skills that increase the likelihood of deriving pleasure from behavioral activation (e.g., assertiveness skills, developing an exercise plan, less internal/more external focus, increased social involvement); reinforce success. Engage the client  in "behavioral activation," increasing his/her activity level and contact with sources of reward, while identifying processes that inhibit activation. use behavioral techniques such as instruction,  rehearsal, role-playing, role reversal, as needed, to facilitate activity in the client's daily life; reinforce success. 3. Develop healthy interpersonal relationships that lead to the alleviation and help prevent the relapse of depression. 4. Develop healthy thinking patterns and beliefs about self, others, and the world that lead to the alleviation and help prevent the relapse of depression. 5. Enhance ability to effectively cope with the full variety of life's worries and anxieties. 6. Learn and implement coping skills that result in a reduction of anxiety and worry, and improved daily functioning. Objective Learn and implement problem-solving strategies for realistically addressing worries. Target Date: 2023-02-12 Frequency: Biweekly  Progress: 10 Modality: individual  Related Interventions Assign the client a homework exercise in which he/she problem-solves a current problem (see Mastery of Your Anxiety and Worry: Workbook by Adora Fridge and Eliot Ford or Generalized Anxiety Disorder by Eather Colas, and Eliot Ford); review, reinforce success, and provide corrective feedback toward improvement. Teach the client problem-solving strategies involving specifically defining a problem, generating options for addressing it, evaluating the pros and cons of each option, selecting and implementing an optional action, and reevaluating and refining the action. Objective Learn and implement calming skills to reduce overall anxiety and manage anxiety symptoms. Target Date: 2023-02-12 Frequency: Biweekly  Progress: 10 Modality: individual  Related Interventions Assign the client to read about progressive muscle relaxation and other calming strategies in relevant books or treatment manuals (e.g., Progressive Relaxation Training by Leroy Kennedy; Mastery of Your Anxiety and Worry: Workbook by Beckie Busing). Assign the client homework each session in which he/she practices relaxation exercises daily,  gradually applying them progressively from non-anxiety-provoking to anxiety-provoking situations; review and reinforce success while providing corrective feedback toward improvement. Teach the client calming/relaxation skills (e.g., applied relaxation, progressive muscle relaxation, cue controlled relaxation; mindful breathing; biofeedback) and how to discriminate better between relaxation and tension; teach the client how to apply these skills to his/her daily life. 7. Recognize, accept, and cope with feelings of depression. 8. Reduce overall frequency, intensity, and duration of the anxiety so that daily functioning is not impaired. 9. Resolve the core conflict that is the source of anxiety. 10. Stabilize anxiety level while increasing ability to function on a daily basis. Diagnosis Axis none F33.1 Major Depressive Disorder  Axis none F41.1 General Anxiety Disorder  Conditions For Discharge Achievement of treatment goals and objectives   Clint Bolder, LCSW

## 2022-10-10 DIAGNOSIS — Z6832 Body mass index (BMI) 32.0-32.9, adult: Secondary | ICD-10-CM | POA: Diagnosis not present

## 2022-10-10 DIAGNOSIS — G500 Trigeminal neuralgia: Secondary | ICD-10-CM | POA: Diagnosis not present

## 2022-10-13 ENCOUNTER — Encounter: Payer: Self-pay | Admitting: Family Medicine

## 2022-10-15 DIAGNOSIS — H524 Presbyopia: Secondary | ICD-10-CM | POA: Diagnosis not present

## 2022-10-16 ENCOUNTER — Ambulatory Visit: Payer: Medicare Other | Admitting: Gastroenterology

## 2022-10-23 ENCOUNTER — Ambulatory Visit (INDEPENDENT_AMBULATORY_CARE_PROVIDER_SITE_OTHER): Payer: Medicare Other | Admitting: Psychology

## 2022-10-23 DIAGNOSIS — F331 Major depressive disorder, recurrent, moderate: Secondary | ICD-10-CM

## 2022-10-23 DIAGNOSIS — F411 Generalized anxiety disorder: Secondary | ICD-10-CM

## 2022-10-23 NOTE — Progress Notes (Signed)
Desiree Wilson Counselor/Therapist Progress Note  Patient ID: Desiree Wilson, MRN: [id number],    Date: 10/23/2022  Time Spent: 9:00am-9:55am    55 minutes   Treatment Type: Individual Therapy  Reported Symptoms: anxiety, pain  Mental Status Exam: Appearance:  Casual and Neat     Behavior: Appropriate  Motor: Normal  Speech/Language:  Normal Rate  Affect: Appropriate  Mood: normal  Thought process: normal  Thought content:   WNL  Sensory/Perceptual disturbances:   WNL  Orientation: oriented to person, place, time/date, and situation  Attention: Good  Concentration: Good  Memory: WNL  Fund of knowledge:  Good  Insight:   Good  Judgment:  Good  Impulse Control: Good   Risk Assessment: Danger to Self:  No Self-injurious Behavior: No Danger to Others: No Duty to Warn:no Physical Aggression / Violence:No  Access to Firearms a concern: No  Gang Involvement:No   Subjective:  Pt present for face-to-face individual therapy via video Webex.  Pt consents to telehealth video session due to COVID 19 pandemic. Location of pt: home Location of therapist: home office.  Pt talked about her health.   She continues to be in a lot of pain.  She saw her PCP who increased her Cymbalta dosage.   Pt has had trouble getting her pain medication from the pain clinic due to insurance issues.   Pt called her insurance company and they expedited the approval for the compound ketamine but pt's pain clinic has been difficult to deal with in doing their part to submit the claims.   Pt has felt very upset and anxious about how difficult it has been to access care.   She has had so much pain that it has impacted her functioning.  Worked with pt on pain management strategies.   Worked on self care strategies. Pt talked about her relationship with her mother.  Her mother set a limit with pt and told her she does not want to be involved in managing her pain or medical issues.   Pt feels  confused about what she can and can't talk about.  Addressed how pt can have a clarifying conversation with her mother.   Provided supportive therapy.    Interventions: Cognitive Behavioral Therapy and Insight-Oriented  Diagnosis: F33.1 and F41.1   Plan of Care: Recommend ongoing therapy.   Pt participated with setting treatment goals.   Pt states her goals for therapy are to have a place to process her feelings.   Pt wants to improve coping skills.   Plan to continue to meet every two weeks.  Pt is progressing toward treatment goals.    Treatment Plan (Treatment Plan Target Date: 02/12/2023) Client Abilities/Strengths  Pt is bright, engaging, and motivated for therapy.  Client Treatment Preferences  Individual therapy.  Client Statement of Needs  Improve copings skills and have a safe place to talk about feelings.   Symptoms  Depressed or irritable mood. Excessive and/or unrealistic worry that is difficult to control occurring more days than not for at least 6 months about a number of events or activities. Hypervigilance (e.g., feeling constantly on edge, experiencing concentration difficulties, having trouble falling or staying asleep, exhibiting a general state of irritability).  Problems Addressed  Unipolar Depression, Anxiety Goals 1. Alleviate depressive symptoms and return to previous level of effective functioning. 2. Appropriately grieve the loss in order to normalize mood and to return to previously adaptive level of functioning. Objective Learn and implement behavioral strategies to  overcome depression. Target Date: 2023-02-12 Frequency: Biweekly  Progress: 10 Modality: individual  Related Interventions Assist the client in developing skills that increase the likelihood of deriving pleasure from behavioral activation (e.g., assertiveness skills, developing an exercise plan, less internal/more external focus, increased social involvement); reinforce success. Engage the client  in "behavioral activation," increasing his/her activity level and contact with sources of reward, while identifying processes that inhibit activation. use behavioral techniques such as instruction, rehearsal, role-playing, role reversal, as needed, to facilitate activity in the client's daily life; reinforce success. 3. Develop healthy interpersonal relationships that lead to the alleviation and help prevent the relapse of depression. 4. Develop healthy thinking patterns and beliefs about self, others, and the world that lead to the alleviation and help prevent the relapse of depression. 5. Enhance ability to effectively cope with the full variety of life's worries and anxieties. 6. Learn and implement coping skills that result in a reduction of anxiety and worry, and improved daily functioning. Objective Learn and implement problem-solving strategies for realistically addressing worries. Target Date: 2023-02-12 Frequency: Biweekly  Progress: 10 Modality: individual  Related Interventions Assign the client a homework exercise in which he/she problem-solves a current problem (see Mastery of Your Anxiety and Worry: Workbook by Adora Fridge and Eliot Ford or Generalized Anxiety Disorder by Eather Colas, and Eliot Ford); review, reinforce success, and provide corrective feedback toward improvement. Teach the client problem-solving strategies involving specifically defining a problem, generating options for addressing it, evaluating the pros and cons of each option, selecting and implementing an optional action, and reevaluating and refining the action. Objective Learn and implement calming skills to reduce overall anxiety and manage anxiety symptoms. Target Date: 2023-02-12 Frequency: Biweekly  Progress: 10 Modality: individual  Related Interventions Assign the client to read about progressive muscle relaxation and other calming strategies in relevant books or treatment manuals (e.g., Progressive Relaxation  Training by Leroy Kennedy; Mastery of Your Anxiety and Worry: Workbook by Beckie Busing). Assign the client homework each session in which he/she practices relaxation exercises daily, gradually applying them progressively from non-anxiety-provoking to anxiety-provoking situations; review and reinforce success while providing corrective feedback toward improvement. Teach the client calming/relaxation skills (e.g., applied relaxation, progressive muscle relaxation, cue controlled relaxation; mindful breathing; biofeedback) and how to discriminate better between relaxation and tension; teach the client how to apply these skills to his/her daily life. 7. Recognize, accept, and cope with feelings of depression. 8. Reduce overall frequency, intensity, and duration of the anxiety so that daily functioning is not impaired. 9. Resolve the core conflict that is the source of anxiety. 10. Stabilize anxiety level while increasing ability to function on a daily basis. Diagnosis Axis none F33.1 Major Depressive Disorder  Axis none F41.1 General Anxiety Disorder  Conditions For Discharge Achievement of treatment goals and objectives   Clint Bolder, LCSW

## 2022-10-26 ENCOUNTER — Telehealth: Payer: Self-pay | Admitting: Family Medicine

## 2022-10-26 NOTE — Telephone Encounter (Signed)
Called patient regarding AWVS, patient declined. Desiree Wilson

## 2022-11-01 DIAGNOSIS — G501 Atypical facial pain: Secondary | ICD-10-CM | POA: Diagnosis not present

## 2022-11-01 DIAGNOSIS — G894 Chronic pain syndrome: Secondary | ICD-10-CM | POA: Diagnosis not present

## 2022-11-01 DIAGNOSIS — Z79899 Other long term (current) drug therapy: Secondary | ICD-10-CM | POA: Diagnosis not present

## 2022-11-01 DIAGNOSIS — G500 Trigeminal neuralgia: Secondary | ICD-10-CM | POA: Diagnosis not present

## 2022-11-02 ENCOUNTER — Other Ambulatory Visit: Payer: Self-pay | Admitting: Family Medicine

## 2022-11-02 DIAGNOSIS — F325 Major depressive disorder, single episode, in full remission: Secondary | ICD-10-CM

## 2022-11-09 ENCOUNTER — Encounter: Payer: Self-pay | Admitting: Family Medicine

## 2022-11-09 NOTE — Telephone Encounter (Signed)
I agree, I have not written for this in over 3 years.  Do you think she might mean trazodone?  The names are very similar.

## 2022-11-12 ENCOUNTER — Other Ambulatory Visit: Payer: Self-pay

## 2022-11-12 ENCOUNTER — Ambulatory Visit (INDEPENDENT_AMBULATORY_CARE_PROVIDER_SITE_OTHER): Payer: Medicare Other | Admitting: Psychology

## 2022-11-12 DIAGNOSIS — F331 Major depressive disorder, recurrent, moderate: Secondary | ICD-10-CM | POA: Diagnosis not present

## 2022-11-12 DIAGNOSIS — F411 Generalized anxiety disorder: Secondary | ICD-10-CM | POA: Diagnosis not present

## 2022-11-12 DIAGNOSIS — F5101 Primary insomnia: Secondary | ICD-10-CM

## 2022-11-12 MED ORDER — TRAZODONE HCL 100 MG PO TABS: ORAL_TABLET | ORAL | 0 refills | Status: DC

## 2022-11-12 NOTE — Progress Notes (Signed)
[facility] Counselor/Therapist Progress Note  Patient ID: Desiree Wilson, MRN: [id number],    Date: 11/12/2022  Time Spent: 11:00am-11:50am    50 minutes   Treatment Type: Individual Therapy  Reported Symptoms: anxiety, pain  Mental Status Exam: Appearance:  Casual and Neat     Behavior: Appropriate  Motor: Normal  Speech/Language:  Normal Rate  Affect: Appropriate  Mood: normal  Thought process: normal  Thought content:   WNL  Sensory/Perceptual disturbances:   WNL  Orientation: oriented to person, place, time/date, and situation  Attention: Good  Concentration: Good  Memory: WNL  Fund of knowledge:  Good  Insight:   Good  Judgment:  Good  Impulse Control: Good   Risk Assessment: Danger to Self:  No Self-injurious Behavior: No Danger to Others: No Duty to Warn:no Physical Aggression / Violence:No  Access to Firearms a concern: No  Gang Involvement:No   Subjective:  Pt present for face-to-face individual therapy via video Webex.  Pt consents to telehealth video session due to COVID 19 pandemic. Location of pt: home Location of therapist: home office.  Pt talked about her health.   Pt states she is in a lot of pain. Worked with pt on pain management strategies.  Pt is still having trouble accessing care for ketamine infusions.   Pt talked about her relationship with her mother.  She had a talk with her to clarify the boundaries that her mother had set.  The talk went well and it was helpful to pt to get clarification.   Her mother still is interested in hearing about pt's life but doesn't want the phone calls of pt crying in pain. Helped pt process her feelings and relationship dynamics.   Pt talked about a recent death in the family.  Pt's brother's wife's father passed away.  Pt was close to him.  Helped pt process her feelings and grief.  Worked on self care strategies.  Provided supportive therapy.    Interventions: Cognitive Behavioral  Therapy and Insight-Oriented  Diagnosis: F33.1 and F41.1   Plan of Care: Recommend ongoing therapy.   Pt participated with setting treatment goals.   Pt states her goals for therapy are to have a place to process her feelings.   Pt wants to improve coping skills.   Plan to continue to meet every two weeks.  Pt is progressing toward treatment goals.    Treatment Plan (Treatment Plan Target Date: 02/12/2023) Client Abilities/Strengths  Pt is bright, engaging, and motivated for therapy.  Client Treatment Preferences  Individual therapy.  Client Statement of Needs  Improve copings skills and have a safe place to talk about feelings.   Symptoms  Depressed or irritable mood. Excessive and/or unrealistic worry that is difficult to control occurring more days than not for at least 6 months about a number of events or activities. Hypervigilance (e.g., feeling constantly on edge, experiencing concentration difficulties, having trouble falling or staying asleep, exhibiting a general state of irritability).  Problems Addressed  Unipolar Depression, Anxiety Goals 1. Alleviate depressive symptoms and return to previous level of effective functioning. 2. Appropriately grieve the loss in order to normalize mood and to return to previously adaptive level of functioning. Objective Learn and implement behavioral strategies to overcome depression. Target Date: 2023-02-12 Frequency: Biweekly  Progress: 10 Modality: individual  Related Interventions Assist the client in developing skills that increase the likelihood of deriving pleasure from behavioral activation (e.g., assertiveness skills, developing an exercise plan, less internal/more external  focus, increased social involvement); reinforce success. Engage the client in "behavioral activation," increasing his/her activity level and contact with sources of reward, while identifying processes that inhibit activation. use behavioral techniques such as  instruction, rehearsal, role-playing, role reversal, as needed, to facilitate activity in the client's daily life; reinforce success. 3. Develop healthy interpersonal relationships that lead to the alleviation and help prevent the relapse of depression. 4. Develop healthy thinking patterns and beliefs about self, others, and the world that lead to the alleviation and help prevent the relapse of depression. 5. Enhance ability to effectively cope with the full variety of life's worries and anxieties. 6. Learn and implement coping skills that result in a reduction of anxiety and worry, and improved daily functioning. Objective Learn and implement problem-solving strategies for realistically addressing worries. Target Date: 2023-02-12 Frequency: Biweekly  Progress: 10 Modality: individual  Related Interventions Assign the client a homework exercise in which he/she problem-solves a current problem (see Mastery of Your Anxiety and Worry: Workbook by Adora Fridge and Eliot Ford or Generalized Anxiety Disorder by Eather Colas, and Eliot Ford); review, reinforce success, and provide corrective feedback toward improvement. Teach the client problem-solving strategies involving specifically defining a problem, generating options for addressing it, evaluating the pros and cons of each option, selecting and implementing an optional action, and reevaluating and refining the action. Objective Learn and implement calming skills to reduce overall anxiety and manage anxiety symptoms. Target Date: 2023-02-12 Frequency: Biweekly  Progress: 10 Modality: individual  Related Interventions Assign the client to read about progressive muscle relaxation and other calming strategies in relevant books or treatment manuals (e.g., Progressive Relaxation Training by Leroy Kennedy; Mastery of Your Anxiety and Worry: Workbook by Beckie Busing). Assign the client homework each session in which he/she practices relaxation exercises  daily, gradually applying them progressively from non-anxiety-provoking to anxiety-provoking situations; review and reinforce success while providing corrective feedback toward improvement. Teach the client calming/relaxation skills (e.g., applied relaxation, progressive muscle relaxation, cue controlled relaxation; mindful breathing; biofeedback) and how to discriminate better between relaxation and tension; teach the client how to apply these skills to his/her daily life. 7. Recognize, accept, and cope with feelings of depression. 8. Reduce overall frequency, intensity, and duration of the anxiety so that daily functioning is not impaired. 9. Resolve the core conflict that is the source of anxiety. 10. Stabilize anxiety level while increasing ability to function on a daily basis. Diagnosis Axis none F33.1 Major Depressive Disorder  Axis none F41.1 General Anxiety Disorder  Conditions For Discharge Achievement of treatment goals and objectives   Clint Bolder, LCSW

## 2022-11-28 DIAGNOSIS — L820 Inflamed seborrheic keratosis: Secondary | ICD-10-CM | POA: Diagnosis not present

## 2022-11-28 DIAGNOSIS — E669 Obesity, unspecified: Secondary | ICD-10-CM | POA: Diagnosis not present

## 2022-11-28 DIAGNOSIS — D485 Neoplasm of uncertain behavior of skin: Secondary | ICD-10-CM | POA: Diagnosis not present

## 2022-12-07 DIAGNOSIS — G501 Atypical facial pain: Secondary | ICD-10-CM | POA: Diagnosis not present

## 2022-12-18 ENCOUNTER — Ambulatory Visit: Payer: Medicare Other | Admitting: Gastroenterology

## 2022-12-20 ENCOUNTER — Telehealth (INDEPENDENT_AMBULATORY_CARE_PROVIDER_SITE_OTHER): Payer: Medicare Other | Admitting: Family Medicine

## 2022-12-20 DIAGNOSIS — G501 Atypical facial pain: Secondary | ICD-10-CM

## 2022-12-20 DIAGNOSIS — G894 Chronic pain syndrome: Secondary | ICD-10-CM

## 2022-12-20 DIAGNOSIS — R7303 Prediabetes: Secondary | ICD-10-CM | POA: Diagnosis not present

## 2022-12-20 DIAGNOSIS — F329 Major depressive disorder, single episode, unspecified: Secondary | ICD-10-CM | POA: Diagnosis not present

## 2022-12-20 DIAGNOSIS — F5101 Primary insomnia: Secondary | ICD-10-CM

## 2022-12-20 DIAGNOSIS — G500 Trigeminal neuralgia: Secondary | ICD-10-CM

## 2022-12-20 DIAGNOSIS — E785 Hyperlipidemia, unspecified: Secondary | ICD-10-CM

## 2022-12-20 MED ORDER — TRAZODONE HCL 100 MG PO TABS: ORAL_TABLET | ORAL | 1 refills | Status: DC

## 2022-12-20 MED ORDER — DULOXETINE HCL 30 MG PO CPEP: 30.0000 mg | ORAL_CAPSULE | Freq: Every morning | ORAL | 1 refills | Status: DC

## 2022-12-20 NOTE — Assessment & Plan Note (Signed)
You for A1c encouraged her to come by the lab in the next couple of weeks.

## 2022-12-20 NOTE — Assessment & Plan Note (Signed)
Due to recheck lipids. 

## 2022-12-20 NOTE — Assessment & Plan Note (Addendum)
Decrease in PHQ-9 score by 2 points and decreasing GAD-7 score by 3 points.  Happy with her current regimen she has noticed improvement by upping the dose and feels like it has really helped her get through the last month.

## 2022-12-20 NOTE — Progress Notes (Signed)
Established Patient Office Visit  Subjective   Patient ID: Desiree Wilson, female    DOB: [birth year]  Age: 56 y.o. MRN: [id number]  No chief complaint on file.   HPI  Here today to follow-up for depression/anxiety-when I last saw her she was having significant event difficulty with her providers being able to get ketamine for pain management.  She was having a lot of anxiety around increased pain.  We decided to increase her Cymbalta from 60 mg daily to a total of 90 mg daily. She has felt it was really helpful.  Denies side effects.  Did have her consultation at [facility] for pain management.  In hopes that they would be able to either provide an alternative or be able to provide ketamine.  They referred her to a separate clinic in their local area that does provide ketamine infusions for her pain control.  Impaired fasting glucose-no increased thirst or urination. No symptoms consistent with hypoglycemia.     12/20/2022    1:11 PM 09/26/2022   11:49 AM 05/15/2022    9:31 AM  PHQ9 SCORE ONLY  PHQ-9 Total Score '10 12 21      '$ 12/20/2022    1:13 PM 09/26/2022   11:50 AM 05/15/2022    9:29 AM 11/20/2021   12:57 PM  GAD 7 : Generalized Anxiety Score  Nervous, Anxious, on Edge 0 '1 3 2  '$ Control/stop worrying 1 0 3 1  Worry too much - different things 0 0 3 0  Trouble relaxing 0 '1 3 1  '$ Restless 0 '1 3 1  '$ Easily annoyed or irritable 0 '1 2 1  '$ Afraid - awful might happen 0 0 2 0  Total GAD 7 Score '1 4 19 6  '$ Anxiety Difficulty Not difficult at all Very difficult Not difficult at all Not difficult at all         ROS    Objective:     There were no vitals taken for this visit.   Physical Exam Vitals and nursing note reviewed.  Constitutional:      Appearance: She is well-developed.  HENT:     Head: Normocephalic and atraumatic.  Cardiovascular:     Rate and Rhythm: Normal rate and regular rhythm.     Heart sounds: Normal heart sounds.  Pulmonary:     Effort: Pulmonary  effort is normal.     Breath sounds: Normal breath sounds.  Skin:    General: Skin is warm and dry.  Neurological:     Mental Status: She is alert and oriented to person, place, and time.  Psychiatric:        Behavior: Behavior normal.     No results found for any visits on 12/20/22.    The 10-year ASCVD risk score (Arnett DK, et al., 2019) is: 5.7%    Assessment & Plan:   Problem List Items Addressed This Visit       Nervous and Auditory   Trigeminal neuralgia    Hopefully she will be able to restart ketamine infusions.  Were more than happy to also place a referral to see if we can get this expedited.  When she called the office for which she was referred they said they did not have the referral from [facility].  Though I can see in the note where the neurologist did order a referral.      Relevant Medications   DULoxetine (CYMBALTA) 30 MG capsule   traZODone (DESYREL) 100 MG tablet  Other Relevant Orders   Ambulatory referral to Pain Clinic     Other   Primary insomnia    Filled trazodone.      Relevant Medications   traZODone (DESYREL) 100 MG tablet   Prediabetes    You for A1c encouraged her to come by the lab in the next couple of weeks.      Relevant Orders   Lipid Panel w/reflex Direct LDL   COMPLETE METABOLIC PANEL WITH GFR   CBC   HIV antibody (with reflex)   Hepatitis C Antibody   Hyperlipidemia    Due to recheck lipids.      Depression - Primary    Decrease in PHQ-9 score by 2 points and decreasing GAD-7 score by 3 points.  Happy with her current regimen she has noticed improvement by upping the dose and feels like it has really helped her get through the last month.      Relevant Medications   DULoxetine (CYMBALTA) 30 MG capsule   traZODone (DESYREL) 100 MG tablet   Chronic pain syndrome   Relevant Medications   DULoxetine (CYMBALTA) 30 MG capsule   traZODone (DESYREL) 100 MG tablet   Other Relevant Orders   Ambulatory referral to Pain Clinic    Other Visit Diagnoses     Atypical facial pain       Relevant Orders   Ambulatory referral to Pain Clinic       No follow-ups on file.    [name], MD

## 2022-12-20 NOTE — Assessment & Plan Note (Signed)
Filled trazodone.

## 2022-12-20 NOTE — Assessment & Plan Note (Signed)
Hopefully she will be able to restart ketamine infusions.  Were more than happy to also place a referral to see if we can get this expedited.  When she called the office for which she was referred they said they did not have the referral from [facility]  Though I can see in the note where the neurologist did order a referral.

## 2023-01-02 ENCOUNTER — Encounter: Payer: Self-pay | Admitting: Hematology & Oncology

## 2023-01-07 ENCOUNTER — Other Ambulatory Visit: Payer: Self-pay | Admitting: Family Medicine

## 2023-01-07 DIAGNOSIS — F329 Major depressive disorder, single episode, unspecified: Secondary | ICD-10-CM

## 2023-01-09 ENCOUNTER — Encounter: Payer: Self-pay | Admitting: Hematology & Oncology

## 2023-01-09 ENCOUNTER — Ambulatory Visit (INDEPENDENT_AMBULATORY_CARE_PROVIDER_SITE_OTHER): Payer: Self-pay | Admitting: Psychology

## 2023-01-09 DIAGNOSIS — F331 Major depressive disorder, recurrent, moderate: Secondary | ICD-10-CM

## 2023-01-09 DIAGNOSIS — F411 Generalized anxiety disorder: Secondary | ICD-10-CM

## 2023-01-09 NOTE — Progress Notes (Signed)
[place] Counselor/Therapist Progress Note  Patient ID: Desiree Wilson, MRN: [id number],    Date: 01/09/2023  Time Spent: 11:00am-11:50am    50 minutes   Treatment Type: Individual Therapy  Reported Symptoms: anxiety, pain  Mental Status Exam: Appearance:  Casual and Neat     Behavior: Appropriate  Motor: Normal  Speech/Language:  Normal Rate  Affect: Appropriate  Mood: normal  Thought process: normal  Thought content:   WNL  Sensory/Perceptual disturbances:   WNL  Orientation: oriented to person, place, time/date, and situation  Attention: Good  Concentration: Good  Memory: WNL  Fund of knowledge:  Good  Insight:   Good  Judgment:  Good  Impulse Control: Good   Risk Assessment: Danger to Self:  No Self-injurious Behavior: No Danger to Others: No Duty to Warn:no Physical Aggression / Violence:No  Access to Firearms a concern: No  Gang Involvement:No   Subjective:  Pt present for face-to-face individual therapy via video Webex.  Pt consents to telehealth video session due to COVID 19 pandemic. Location of pt: home Location of therapist: home office.  Pt talked about her health.   She has been in a lot of pain and "feels useless" bc she lays in bed a lot bc of the pain.   She can't drive bc of being overly medicated.  She states she is very dependent on her husband [name] who is very supportive. Pt's friend Jenny Reichmann is also very supportive.    Pt feels like she does not have enough pain medication.   She will see her pain management doctor the 25th of January.   Pt states she is tired of not having energy and having so much pain.  Helped pt process her feelings.   In a few weeks pt will be going to the [facility] to be assessed for a stimulator to be placed in her spine or brain that could help with pain.   Worked on self care strategies.  Provided supportive therapy.    Interventions: Cognitive Behavioral Therapy and  Insight-Oriented  Diagnosis: F33.1 and F41.1   Plan of Care: Recommend ongoing therapy.   Pt participated with setting treatment goals.   Pt states her goals for therapy are to have a place to process her feelings.   Pt wants to improve coping skills.   Plan to continue to meet every two weeks.  Pt is progressing toward treatment goals.    Treatment Plan (Treatment Plan Target Date: 02/12/2023) Client Abilities/Strengths  Pt is bright, engaging, and motivated for therapy.  Client Treatment Preferences  Individual therapy.  Client Statement of Needs  Improve copings skills and have a safe place to talk about feelings.   Symptoms  Depressed or irritable mood. Excessive and/or unrealistic worry that is difficult to control occurring more days than not for at least 6 months about a number of events or activities. Hypervigilance (e.g., feeling constantly on edge, experiencing concentration difficulties, having trouble falling or staying asleep, exhibiting a general state of irritability).  Problems Addressed  Unipolar Depression, Anxiety Goals 1. Alleviate depressive symptoms and return to previous level of effective functioning. 2. Appropriately grieve the loss in order to normalize mood and to return to previously adaptive level of functioning. Objective Learn and implement behavioral strategies to overcome depression. Target Date: 2023-02-12 Frequency: Biweekly  Progress: 10 Modality: individual  Related Interventions Assist the client in developing skills that increase the likelihood of deriving pleasure from behavioral activation (e.g., assertiveness skills, developing  an exercise plan, less internal/more external focus, increased social involvement); reinforce success. Engage the client in "behavioral activation," increasing his/her activity level and contact with sources of reward, while identifying processes that inhibit activation. use behavioral techniques such as instruction,  rehearsal, role-playing, role reversal, as needed, to facilitate activity in the client's daily life; reinforce success. 3. Develop healthy interpersonal relationships that lead to the alleviation and help prevent the relapse of depression. 4. Develop healthy thinking patterns and beliefs about self, others, and the world that lead to the alleviation and help prevent the relapse of depression. 5. Enhance ability to effectively cope with the full variety of life's worries and anxieties. 6. Learn and implement coping skills that result in a reduction of anxiety and worry, and improved daily functioning. Objective Learn and implement problem-solving strategies for realistically addressing worries. Target Date: 2023-02-12 Frequency: Biweekly  Progress: 10 Modality: individual  Related Interventions Assign the client a homework exercise in which he/she problem-solves a current problem (see Mastery of Your Anxiety and Worry: Workbook by Adora Fridge and Eliot Ford or Generalized Anxiety Disorder by Eather Colas, and Eliot Ford); review, reinforce success, and provide corrective feedback toward improvement. Teach the client problem-solving strategies involving specifically defining a problem, generating options for addressing it, evaluating the pros and cons of each option, selecting and implementing an optional action, and reevaluating and refining the action. Objective Learn and implement calming skills to reduce overall anxiety and manage anxiety symptoms. Target Date: 2023-02-12 Frequency: Biweekly  Progress: 10 Modality: individual  Related Interventions Assign the client to read about progressive muscle relaxation and other calming strategies in relevant books or treatment manuals (e.g., Progressive Relaxation Training by Leroy Kennedy; Mastery of Your Anxiety and Worry: Workbook by Beckie Busing). Assign the client homework each session in which he/she practices relaxation exercises daily,  gradually applying them progressively from non-anxiety-provoking to anxiety-provoking situations; review and reinforce success while providing corrective feedback toward improvement. Teach the client calming/relaxation skills (e.g., applied relaxation, progressive muscle relaxation, cue controlled relaxation; mindful breathing; biofeedback) and how to discriminate better between relaxation and tension; teach the client how to apply these skills to his/her daily life. 7. Recognize, accept, and cope with feelings of depression. 8. Reduce overall frequency, intensity, and duration of the anxiety so that daily functioning is not impaired. 9. Resolve the core conflict that is the source of anxiety. 10. Stabilize anxiety level while increasing ability to function on a daily basis. Diagnosis Axis none F33.1 Major Depressive Disorder  Axis none F41.1 General Anxiety Disorder  Conditions For Discharge Achievement of treatment goals and objectives   Clint Bolder, LCSW

## 2023-01-24 DIAGNOSIS — F419 Anxiety disorder, unspecified: Secondary | ICD-10-CM | POA: Diagnosis not present

## 2023-01-24 DIAGNOSIS — G500 Trigeminal neuralgia: Secondary | ICD-10-CM | POA: Diagnosis not present

## 2023-01-24 DIAGNOSIS — Z79899 Other long term (current) drug therapy: Secondary | ICD-10-CM | POA: Diagnosis not present

## 2023-01-24 DIAGNOSIS — G501 Atypical facial pain: Secondary | ICD-10-CM | POA: Diagnosis not present

## 2023-01-29 ENCOUNTER — Ambulatory Visit (INDEPENDENT_AMBULATORY_CARE_PROVIDER_SITE_OTHER): Payer: Self-pay | Admitting: Psychology

## 2023-01-29 DIAGNOSIS — F411 Generalized anxiety disorder: Secondary | ICD-10-CM

## 2023-01-29 DIAGNOSIS — F331 Major depressive disorder, recurrent, moderate: Secondary | ICD-10-CM

## 2023-01-29 NOTE — Progress Notes (Signed)
[place] Counselor/Therapist Progress Note  Patient ID: Desiree Wilson, MRN: [id number],    Date: 01/29/2023  Time Spent: 9:00am-9:50am    50 minutes   Treatment Type: Individual Therapy  Reported Symptoms: anxiety, pain  Mental Status Exam: Appearance:  Casual and Neat     Behavior: Appropriate  Motor: Normal  Speech/Language:  Normal Rate  Affect: Appropriate  Mood: normal  Thought process: normal  Thought content:   WNL  Sensory/Perceptual disturbances:   WNL  Orientation: oriented to person, place, time/date, and situation  Attention: Good  Concentration: Good  Memory: WNL  Fund of knowledge:  Good  Insight:   Good  Judgment:  Good  Impulse Control: Good   Risk Assessment: Danger to Self:  No Self-injurious Behavior: No Danger to Others: No Duty to Warn:no Physical Aggression / Violence:No  Access to Firearms a concern: No  Gang Involvement:No   Subjective:  Pt present for face-to-face individual therapy via video Webex.  Pt consents to telehealth video session due to COVID 19 pandemic. Location of pt: home Location of therapist: home office.  Pt talked about her health.   She saw her pain medicine doctor last week.   She talked to them about the ketamine infusions.   She has one scheduled for February 7th.   Pt had a bad pain flare up at 3:00 this morning.  She has had a rough morning.   Worked on coping strategies and provided support.  Pt goes to the [facility] in mid February. Pt talked about helping her friend next week who is having a knee replacement.   Pt talked about her relationship with her mother.  Her mother took pt to her pain medicine appointment and pt and her mother had a good talk about pt's pain and medical issues.  Pt's mother was very supportive.  They talked about pt's father who may need to go to assisted living in the next year. Pt worries about her father bc of his cognitive decline.   Worked on self care  strategies.  Provided supportive therapy.    Interventions: Cognitive Behavioral Therapy and Insight-Oriented  Diagnosis: F33.1 and F41.1   Plan of Care: Recommend ongoing therapy.   Pt participated with setting treatment goals.   Pt states her goals for therapy are to have a place to process her feelings.   Pt wants to improve coping skills.   Plan to continue to meet every two weeks.  Pt is progressing toward treatment goals.    Treatment Plan (Treatment Plan Target Date: 02/12/2023) Client Abilities/Strengths  Pt is bright, engaging, and motivated for therapy.  Client Treatment Preferences  Individual therapy.  Client Statement of Needs  Improve copings skills and have a safe place to talk about feelings.   Symptoms  Depressed or irritable mood. Excessive and/or unrealistic worry that is difficult to control occurring more days than not for at least 6 months about a number of events or activities. Hypervigilance (e.g., feeling constantly on edge, experiencing concentration difficulties, having trouble falling or staying asleep, exhibiting a general state of irritability).  Problems Addressed  Unipolar Depression, Anxiety Goals 1. Alleviate depressive symptoms and return to previous level of effective functioning. 2. Appropriately grieve the loss in order to normalize mood and to return to previously adaptive level of functioning. Objective Learn and implement behavioral strategies to overcome depression. Target Date: 2023-02-12 Frequency: Biweekly  Progress: 10 Modality: individual  Related Interventions Assist the client in developing skills  that increase the likelihood of deriving pleasure from behavioral activation (e.g., assertiveness skills, developing an exercise plan, less internal/more external focus, increased social involvement); reinforce success. Engage the client in "behavioral activation," increasing his/her activity level and contact with sources of reward, while  identifying processes that inhibit activation. use behavioral techniques such as instruction, rehearsal, role-playing, role reversal, as needed, to facilitate activity in the client's daily life; reinforce success. 3. Develop healthy interpersonal relationships that lead to the alleviation and help prevent the relapse of depression. 4. Develop healthy thinking patterns and beliefs about self, others, and the world that lead to the alleviation and help prevent the relapse of depression. 5. Enhance ability to effectively cope with the full variety of life's worries and anxieties. 6. Learn and implement coping skills that result in a reduction of anxiety and worry, and improved daily functioning. Objective Learn and implement problem-solving strategies for realistically addressing worries. Target Date: 2023-02-12 Frequency: Biweekly  Progress: 10 Modality: individual  Related Interventions Assign the client a homework exercise in which he/she problem-solves a current problem (see Mastery of Your Anxiety and Worry: Workbook by Adora Fridge and Eliot Ford or Generalized Anxiety Disorder by Eather Colas, and Eliot Ford); review, reinforce success, and provide corrective feedback toward improvement. Teach the client problem-solving strategies involving specifically defining a problem, generating options for addressing it, evaluating the pros and cons of each option, selecting and implementing an optional action, and reevaluating and refining the action. Objective Learn and implement calming skills to reduce overall anxiety and manage anxiety symptoms. Target Date: 2023-02-12 Frequency: Biweekly  Progress: 10 Modality: individual  Related Interventions Assign the client to read about progressive muscle relaxation and other calming strategies in relevant books or treatment manuals (e.g., Progressive Relaxation Training by Leroy Kennedy; Mastery of Your Anxiety and Worry: Workbook by Beckie Busing). Assign  the client homework each session in which he/she practices relaxation exercises daily, gradually applying them progressively from non-anxiety-provoking to anxiety-provoking situations; review and reinforce success while providing corrective feedback toward improvement. Teach the client calming/relaxation skills (e.g., applied relaxation, progressive muscle relaxation, cue controlled relaxation; mindful breathing; biofeedback) and how to discriminate better between relaxation and tension; teach the client how to apply these skills to his/her daily life. 7. Recognize, accept, and cope with feelings of depression. 8. Reduce overall frequency, intensity, and duration of the anxiety so that daily functioning is not impaired. 9. Resolve the core conflict that is the source of anxiety. 10. Stabilize anxiety level while increasing ability to function on a daily basis. Diagnosis Axis none F33.1 Major Depressive Disorder  Axis none F41.1 General Anxiety Disorder  Conditions For Discharge Achievement of treatment goals and objectives   Clint Bolder, LCSW

## 2023-02-01 ENCOUNTER — Encounter: Payer: Self-pay | Admitting: Hematology & Oncology

## 2023-02-05 ENCOUNTER — Ambulatory Visit: Payer: Medicare Other | Admitting: Gastroenterology

## 2023-02-05 ENCOUNTER — Encounter: Payer: Self-pay | Admitting: Family Medicine

## 2023-02-06 DIAGNOSIS — G501 Atypical facial pain: Secondary | ICD-10-CM | POA: Diagnosis not present

## 2023-02-06 DIAGNOSIS — G500 Trigeminal neuralgia: Secondary | ICD-10-CM | POA: Diagnosis not present

## 2023-02-08 ENCOUNTER — Other Ambulatory Visit: Payer: Self-pay | Admitting: *Deleted

## 2023-02-08 DIAGNOSIS — E7849 Other hyperlipidemia: Secondary | ICD-10-CM

## 2023-02-08 MED ORDER — ROSUVASTATIN CALCIUM 20 MG PO TABS: 20.0000 mg | ORAL_TABLET | Freq: Every day | ORAL | 3 refills | Status: DC

## 2023-02-11 ENCOUNTER — Ambulatory Visit (INDEPENDENT_AMBULATORY_CARE_PROVIDER_SITE_OTHER): Payer: Medicare Other | Admitting: Sports Medicine

## 2023-02-11 DIAGNOSIS — M7711 Lateral epicondylitis, right elbow: Secondary | ICD-10-CM | POA: Diagnosis not present

## 2023-02-11 MED ORDER — CELECOXIB 200 MG PO CAPS: ORAL_CAPSULE | ORAL | 2 refills | Status: DC

## 2023-02-11 NOTE — Assessment & Plan Note (Signed)
Pleasant 57 year old female, several months of pain right lateral elbow, she has been doing a lot of scrolling on the cell phone. We will add a tennis elbow brace, Celebrex, home physical therapy, return in 6 weeks.

## 2023-02-11 NOTE — Progress Notes (Signed)
    Procedures performed today:    None.  Independent interpretation of notes and tests performed by another provider:   None.  Brief History, Exam, Impression, and Recommendations:    Lateral epicondylitis, right elbow Pleasant 57 year old female, several months of pain right lateral elbow, she has been doing a lot of scrolling on the cell phone. We will add a tennis elbow brace, Celebrex, home physical therapy, return in 6 weeks.  Chronic process with exacerbation and pharmacologic invention  ____________________________________________ Gwen Her. Dianah Field, M.D., ABFM., CAQSM., AME. Primary Care and Sports Medicine [facility]  Adjunct Professor of [employer]  Risk manager

## 2023-02-14 ENCOUNTER — Ambulatory Visit: Payer: Medicare Other | Admitting: Psychology

## 2023-02-15 DIAGNOSIS — G509 Disorder of trigeminal nerve, unspecified: Secondary | ICD-10-CM | POA: Diagnosis not present

## 2023-02-19 ENCOUNTER — Other Ambulatory Visit: Payer: Self-pay

## 2023-02-19 ENCOUNTER — Encounter: Payer: Self-pay | Admitting: Family Medicine

## 2023-02-19 DIAGNOSIS — F5101 Primary insomnia: Secondary | ICD-10-CM

## 2023-02-19 MED ORDER — TRAZODONE HCL 100 MG PO TABS: ORAL_TABLET | ORAL | 1 refills | Status: DC

## 2023-02-28 ENCOUNTER — Encounter: Payer: Self-pay | Admitting: Family Medicine

## 2023-02-28 ENCOUNTER — Ambulatory Visit (INDEPENDENT_AMBULATORY_CARE_PROVIDER_SITE_OTHER): Payer: Medicare Other | Admitting: Psychology

## 2023-02-28 DIAGNOSIS — F411 Generalized anxiety disorder: Secondary | ICD-10-CM

## 2023-02-28 DIAGNOSIS — F331 Major depressive disorder, recurrent, moderate: Secondary | ICD-10-CM

## 2023-02-28 NOTE — Progress Notes (Signed)
[facility] Counselor Initial Adult Exam  Name: Desiree Wilson Date: 02/28/2023 MRN: [id number] DOB: Nov 06, 1966 PCP: Hali Marry, MD  Time spent: 9:00am-9:55am    55 minutes  Guardian/Payee:  Crista Curb requested: No   Reason for Visit /Presenting Problem: Pt present for face-to-face initial assessment update via video Webex.  Pt consents to telehealth video session due to COVID 19 pandemic. Location of pt: home Location of therapist: home office.  Pt has trigeminal neuralgia and suffers with chronic pain.    Pt lives with chronic pain daily.  Her pain stays at a 6 or 7 on the pain scale.  A couple of years ago pt started taking ketamine nose spray for pain.  She is treated at a pain clinic. Pt states she continues to have depression and anxiety and chronic pain.  Pt has a lot of fears and anxiety about the weather and driving and going out to social situations.  Pt gets anxious about not being able to take care of herself.  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  Pt has been medicated with anti depressants since then.  Pt has had to have reconstructive surgeries.   Reviewed pt's treatment plan for annual update.  Updated pt's treatment plan and IA.  Pt participated in setting treatment goals.  Plan to meet monthly.    Mental Status Exam: Appearance:   Casual     Behavior:  Appropriate  Motor:  Normal  Speech/Language:   Normal Rate  Affect:  Appropriate  Mood:  normal  Thought process:  normal  Thought content:    WNL  Sensory/Perceptual disturbances:    WNL  Orientation:  oriented to person, place, time/date, and situation  Attention:  Good  Concentration:  Good  Memory:  WNL  Fund of knowledge:   Good  Insight:    Good  Judgment:   Good  Impulse Control:  Good    Reported Symptoms:  sadness, pain  Risk Assessment: Danger to Self:  No Self-injurious  Behavior: No Danger to Others: No Duty to Warn:no Physical Aggression / Violence:No  Access to Firearms a concern: No  Gang Involvement:No  Patient / guardian was educated about steps to take if suicide or homicide risk level increases between visits: n/a While future psychiatric events cannot be accurately predicted, the patient does not currently require acute inpatient psychiatric care and does not currently meet [facility] involuntary commitment criteria.  Substance Abuse History: Current substance abuse: No     Past Psychiatric History:   Previous psychological history is significant for anxiety and depression Outpatient Providers:several outpt therapists throughout the years.   History of Psych Hospitalization: No  Psychological Testing:  n/a    Abuse History:  Victim of: No.,  n/a    Report needed: No. Victim of Neglect:No. Perpetrator of  n/a   Witness / Exposure to Domestic Violence: No   Protective Services Involvement: No  Witness to [employer] Violence:  No   Family History:  Family History  Problem Relation Age of Onset   Breast cancer Mother        lumpectomy and radiation   Obesity Mother    Stroke Father    Diverticulitis Father    Hypertension Father    Hyperlipidemia Father    Depression Father    Alzheimer's disease Father    Colon cancer Neg Hx  Stomach cancer Neg Hx    Esophageal cancer Neg Hx    Pancreatic cancer Neg Hx     Living situation: the patient lives with her partner.   Pt has been with her partner [name] for 11 years.   Pt has a service dog named Bo.  Pt was raised with both parents.  She has a younger brother.  Pt's father worked as a Community education officer.  Pt's mother was a substance abuse Social worker.   They did well financially.  Family history of mental health issues:  pt's father has chronic depression.   No family history of substance abuse of child abuse.    Pt states she had a good childhood.   Pt's parents now live at  [facility], a continuous level of care community.  Pt's father is in the early stages of alzheimers.  Pt's father is 69 and her mother is 73.   Sexual Orientation: Straight  Relationship Status: single  Name of spouse / other:n/a If a parent, number of children / ages:none  Support Systems: significant other friends  Financial Stress:  Yes   Income/Employment/Disability: Employment.  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  Pt works about 7-10 hours a week as a supplement to her disability income.   Military Service: No   Educational History: Education: post Forensic psychologist work or degree  Religion/Sprituality/World View: Agnostic  Any cultural differences that may affect / interfere with treatment:  not applicable   Recreation/Hobbies: pt likes to nap, spending time with her dog Bo.  Pt likes spending time with [name].  Pt has several friends that she facetimes with in the morning.  Pt tends to stay home a lot.    Stressors: Health problems    Strengths: Supportive Relationships, Hopefulness, Self Advocate, and Able to Communicate Effectively  Barriers:  none   Legal History: Pending legal issue / charges: The patient has no significant history of legal issues. History of legal issue / charges:  n/a  Medical History/Surgical History: reviewed Past Medical History:  Diagnosis Date   Allergy    rhinitis   Anemia    Anxiety    Blood transfusion    x2 1983 surgery blood loss   Blood transfusion without reported diagnosis    Depression    Depression    Facial paralysis    right face/ chronic pain syndrome (pain management Dr Sydell Axon)   [id number])    Hyperlipidemia    Hypotension    Iron deficiency anemia 11/15/2014   Iron malabsorption 11/15/2014   Osteoarthritis    Trigeminal neuralgia    right   Trigeminal neuralgia 2002    Past Surgical History:  Procedure Laterality Date   COLONOSCOPY      ESOPHAGOGASTRODUODENOSCOPY     gamma knife Right 01/2018   MANDIBLE SURGERY     NASAL SINUS SURGERY     OSTEOTOMY     plastic facial surgery     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     celecoxib (CELEBREX) 200 MG capsule One to 2 tablets by mouth daily  as needed for pain. 60 capsule 2   clonazePAM (KLONOPIN) 1 MG tablet Take 1 tablet (1 mg total) by mouth 2 (two) times daily. 30 tablet 2   colestipol (COLESTID) 1 g tablet Take 1 tablet (1 g total) by mouth 2 (two) times daily. 180 tablet 3   dicyclomine (BENTYL) 10 MG capsule Take 1 capsule (10 mg total) by mouth every 8 (eight) hours as needed for spasms. 30 capsule 3   DULoxetine (CYMBALTA) 30 MG capsule TAKE 1 CAPSULE(30 MG) BY MOUTH IN THE MORNING 30 capsule 0   DULoxetine (CYMBALTA) 60 MG capsule TAKE 1 CAPSULE(60 MG) BY MOUTH DAILY 90 capsule 1   estradiol (ESTRACE) 0.1 MG/GM vaginal cream Place 1 Applicatorful vaginally at bedtime. X 7 days, then 3 x a week 85 g 3   ferrous fumarate (HEMOCYTE - 106 MG FE) 325 (106 Fe) MG TABS tablet Take 1 tablet by mouth every other day.     fluticasone (FLONASE) 50 MCG/ACT nasal spray Place 2 sprays into both nostrils daily. 48 g 3   folic acid (FOLVITE) 1 MG tablet Take 1 mg by mouth daily.     frovatriptan (FROVA) 2.5 MG tablet Take 2.5 mg by mouth daily as needed.     gabapentin (NEURONTIN) 600 MG tablet Take 600 mg by mouth 3 (three) times daily.     ketamine (KETALAR) 10 MG/ML injection Inject into the vein every 3 (three) months.      ketorolac (TORADOL) 10 MG tablet Take 10 mg by mouth every 8 (eight) hours as needed for  severe pain.     loperamide (IMODIUM A-D) 2 MG tablet Take 1 tablet (2 mg total) by mouth in the morning. May take again later in the day as needed 30 tablet 0   Multiple Vitamin (MULTIVITAMIN) capsule Take 1 capsule by mouth daily.     omeprazole (PRILOSEC) 40 MG capsule TAKE 1 CAPSULE BY MOUTH DAILY 90 capsule 3   Oxcarbazepine (TRILEPTAL) 300 MG tablet Take 300 mg by mouth 2 (two) times daily.     Probiotic Product (PROBIOTIC PO) Take by mouth.     promethazine (PHENERGAN) 25 MG tablet Take 1 tablet (25 mg total) by mouth every 8 (eight) hours as needed for nausea or vomiting. 20 tablet 0   rosuvastatin (CRESTOR) 20 MG tablet Take 1 tablet (20 mg total) by mouth daily. 90 tablet 3   tiZANidine (ZANAFLEX) 4 MG tablet Take 4 mg by mouth every 6 (six) hours as needed.     traZODone (DESYREL) 100 MG tablet TAKE 2 TABLETS(200 MG) BY MOUTH AT BEDTIME AS NEEDED FOR SLEEP 180 tablet 1   vitamin B-12 (CYANOCOBALAMIN) 1000 MCG tablet Take 1,000 mcg by mouth daily.     vitamin C (ASCORBIC ACID) 500 MG tablet Take 500 mg by mouth daily.     No current facility-administered medications for this visit.    Allergies  Allergen Reactions   Azithromycin Rash    REACTION: C Diff Other reaction(s): Other (See Comments), Other (See Comments) REACTION: C Diff REACTION: C Diff REACTION: C Diff    Methadone     Other reaction(s): Hallucinations, Other (See Comments), Other (See Comments) Hallucinations Hallucinations Hallucinations    Tapentadol Anxiety    Other reaction(s): Dizziness   Phenobarbital     REACTION: Excited   Wellbutrin [Bupropion] Other (See Comments)    Not effective.     Diagnoses: F33.1  Plan of Care: Recommend ongoing therapy.   Pt participated with setting  treatment goals.   Pt states her goals for therapy are to have a place to process her feelings.   Pt wants to improve coping skills.   Plan to meet every month.    Treatment Plan (Treatment Plan Target Date:  2/29/2025) Client Abilities/Strengths  Pt is bright, engaging, and motivated for therapy.  Client Treatment Preferences  Individual therapy.  Client Statement of Needs  Improve copings skills and have a safe place to talk about feelings.   Symptoms  Depressed or irritable mood. Excessive and/or unrealistic worry that is difficult to control occurring more days than not for at least 6 months about a number of events or activities. Hypervigilance (e.g., feeling constantly on edge, experiencing concentration difficulties, having trouble falling or staying asleep, exhibiting a general state of irritability).  Problems Addressed  Unipolar Depression, Anxiety Goals 1. Alleviate depressive symptoms and return to previous level of effective functioning. 2. Appropriately grieve the loss in order to normalize mood and to return to previously adaptive level of functioning. Objective Learn and implement behavioral strategies to overcome depression. Target Date: 2025-02-29 Frequency: Monthly  Progress: 40 Modality: individual  Related Interventions Assist the client in developing skills that increase the likelihood of deriving pleasure from behavioral activation (e.g., assertiveness skills, developing an exercise plan, less internal/more external focus, increased social involvement); reinforce success. Engage the client in "behavioral activation," increasing his/her activity level and contact with sources of reward, while identifying processes that inhibit activation. use behavioral techniques such as instruction, rehearsal, role-playing, role reversal, as needed, to facilitate activity in the client's daily life; reinforce success. 3. Develop healthy interpersonal relationships that lead to the alleviation and help prevent the relapse of depression. 4. Develop healthy thinking patterns and beliefs about self, others, and the world that lead to the alleviation and help prevent the relapse of depression. 5.  Enhance ability to effectively cope with the full variety of life's worries and anxieties. 6. Learn and implement coping skills that result in a reduction of anxiety and worry, and improved daily functioning. Objective Learn and implement problem-solving strategies for realistically addressing worries. Target Date: 2025-02-29 Frequency: Monthly  Progress: 40 Modality: individual  Related Interventions Assign the client a homework exercise in which he/she problem-solves a current problem (see Mastery of Your Anxiety and Worry: Workbook by Adora Fridge and Eliot Ford or Generalized Anxiety Disorder by Eather Colas, and Eliot Ford); review, reinforce success, and provide corrective feedback toward improvement. Teach the client problem-solving strategies involving specifically defining a problem, generating options for addressing it, evaluating the pros and cons of each option, selecting and implementing an optional action, and reevaluating and refining the action. Objective Learn and implement calming skills to reduce overall anxiety and manage anxiety symptoms. Target Date: 2025-02-29 Frequency: Monthly  Progress: 40 Modality: individual  Related Interventions Assign the client to read about progressive muscle relaxation and other calming strategies in relevant books or treatment manuals (e.g., Progressive Relaxation Training by Leroy Kennedy; Mastery of Your Anxiety and Worry: Workbook by Beckie Busing). Assign the client homework each session in which he/she practices relaxation exercises daily, gradually applying them progressively from non-anxiety-provoking to anxiety-provoking situations; review and reinforce success while providing corrective feedback toward improvement. Teach the client calming/relaxation skills (e.g., applied relaxation, progressive muscle relaxation, cue controlled relaxation; mindful breathing; biofeedback) and how to discriminate better between relaxation and tension; teach  the client how to apply these skills to his/her daily life. 7. Recognize, accept, and cope with feelings of depression. 8. Reduce overall frequency,  intensity, and duration of the anxiety so that daily functioning is not impaired. 9. Resolve the core conflict that is the source of anxiety. 10. Stabilize anxiety level while increasing ability to function on a daily basis. Diagnosis Axis none F33.1 Major Depressive Disorder  Axis none F41.1 General Anxiety Disorder  Conditions For Discharge Achievement of treatment goals and objectives    Clint Bolder, LCSW

## 2023-03-01 ENCOUNTER — Encounter: Payer: Self-pay | Admitting: Sports Medicine

## 2023-03-05 DIAGNOSIS — Z5181 Encounter for therapeutic drug level monitoring: Secondary | ICD-10-CM | POA: Diagnosis not present

## 2023-03-05 DIAGNOSIS — F4542 Pain disorder with related psychological factors: Secondary | ICD-10-CM | POA: Diagnosis not present

## 2023-03-05 DIAGNOSIS — Z79899 Other long term (current) drug therapy: Secondary | ICD-10-CM | POA: Diagnosis not present

## 2023-03-05 DIAGNOSIS — G501 Atypical facial pain: Secondary | ICD-10-CM | POA: Diagnosis not present

## 2023-03-05 DIAGNOSIS — M7918 Myalgia, other site: Secondary | ICD-10-CM | POA: Diagnosis not present

## 2023-03-05 DIAGNOSIS — G894 Chronic pain syndrome: Secondary | ICD-10-CM | POA: Diagnosis not present

## 2023-03-06 ENCOUNTER — Ambulatory Visit (INDEPENDENT_AMBULATORY_CARE_PROVIDER_SITE_OTHER): Payer: Medicare Other | Admitting: Family Medicine

## 2023-03-06 ENCOUNTER — Encounter: Payer: Self-pay | Admitting: Family Medicine

## 2023-03-06 VITALS — BP 141/94 | HR 97 | Ht 65.0 in | Wt 199.2 lb

## 2023-03-06 DIAGNOSIS — L723 Sebaceous cyst: Secondary | ICD-10-CM | POA: Diagnosis not present

## 2023-03-06 DIAGNOSIS — G510 Bell's palsy: Secondary | ICD-10-CM | POA: Diagnosis not present

## 2023-03-06 MED ORDER — DOXYCYCLINE HYCLATE 100 MG PO TABS: 100.0000 mg | ORAL_TABLET | Freq: Two times a day (BID) | ORAL | 0 refills | Status: DC

## 2023-03-06 NOTE — Assessment & Plan Note (Signed)
Will scan in the note from Dr. Lebron Quam.  She is contemplating having the procedure done but it is not FDA approved and is quite risky and likely will be quite expensive.  She is hoping she could still just get authorization for the ketamine injections which have worked for her for years.

## 2023-03-06 NOTE — Progress Notes (Signed)
Acute Office Visit  Subjective:     Patient ID: Desiree Wilson, female    DOB: 1966/09/24, 57 y.o.   MRN: [id number]  Chief Complaint  Patient presents with   Cyst    Right labia    HPI Patient is in today for cyst on her labia and felt like it was the size of a marble. It is getting smaller.  She has been doing some hot compresses she noticed a little clear serous drainage maybe a week ago.  She also had a neurosurgical consultation done with Dr. Cleophus Molt on February 15, 2023.  In particular regards to the trigeminal neuropathy.  They discussed interventional options and Dr. Lebron Quam recommended proceeding with a staged trial implant of motor cortex stimulation as it was felt to be extremely effective for numbness and pain experienced by the neuropathic pain.  They are recommending a pain psychology evaluation in addition to a brain MRI before proceeding.  She also finally had a consultation with a physician in [place] who does do the ketamine injections but he does not do them with insurance so she is still trying to work with Dr. Joylene John and Dr. Mechele Dawley.  She just feels like they office is not spotting she is left for different messages and nobody will call her back in regards to the process of getting the prior authorization with the insurance company.  ROS      Objective:    BP (!) 141/94 (BP Location: Left Arm, Patient Position: Sitting, Cuff Size: Large)   Pulse 97   Ht '5\' 5"'$  (1.651 m)   Wt 199 lb 3.2 oz (90.4 kg)   SpO2 98%   BMI 33.15 kg/m    Physical Exam Vitals reviewed.  Constitutional:      Appearance: She is well-developed.  HENT:     Head: Normocephalic and atraumatic.  Eyes:     Conjunctiva/sclera: Conjunctivae normal.  Cardiovascular:     Rate and Rhythm: Normal rate.  Pulmonary:     Effort: Pulmonary effort is normal.  Genitourinary:    Comments: At the base of the right labia going towards the rectum she has a fairly large approximately 4 x 3 and  half centimeter sebaceous cyst.  There is a fine pustule on the inner part of the labia where it looks like it might drain.  But no active drainage.  I do feel a line record going up towards the mons pubis laterally on the right and with what feels like a right anterior groin swollen lymph node measuring little greater than a centimeter. Skin:    General: Skin is dry.     Coloration: Skin is not pale.  Neurological:     Mental Status: She is alert and oriented to person, place, and time.  Psychiatric:        Behavior: Behavior normal.     No results found for any visits on 03/06/23.      Assessment & Plan:   Problem List Items Addressed This Visit       Nervous and Auditory   Facial nerve palsy    Will scan in the note from Dr. Lebron Quam.  She is contemplating having the procedure done but it is not FDA approved and is quite risky and likely will be quite expensive.  She is hoping she could still just get authorization for the ketamine injections which have worked for her for years.      Other Visit Diagnoses  Sebaceous cyst    -  Primary   Relevant Orders   Ambulatory referral to Obstetrics / Gynecology      Inflamed sebaceous cyst-she says it has gotten a lot smaller in the last couple of days.  She has not noticed any active drainage.  I am going to go ahead and place her on doxycycline to continue with hot warm compresses and hot soaks.  It may drain a little since there is a small pustule present.  It still like her to get in with GYN because I am concerned that it may have a track.  Referral placed today.   Meds ordered this encounter  Medications   doxycycline (VIBRA-TABS) 100 MG tablet    Sig: Take 1 tablet (100 mg total) by mouth 2 (two) times daily.    Dispense:  14 tablet    Refill:  0    No follow-ups on file.  [name], MD

## 2023-03-07 ENCOUNTER — Ambulatory Visit (INDEPENDENT_AMBULATORY_CARE_PROVIDER_SITE_OTHER): Payer: Medicare Other | Admitting: Sports Medicine

## 2023-03-07 ENCOUNTER — Ambulatory Visit (INDEPENDENT_AMBULATORY_CARE_PROVIDER_SITE_OTHER): Payer: Medicare Other

## 2023-03-07 DIAGNOSIS — M7711 Lateral epicondylitis, right elbow: Secondary | ICD-10-CM | POA: Diagnosis not present

## 2023-03-07 DIAGNOSIS — M180 Bilateral primary osteoarthritis of first carpometacarpal joints: Secondary | ICD-10-CM | POA: Insufficient documentation

## 2023-03-07 DIAGNOSIS — E785 Hyperlipidemia, unspecified: Secondary | ICD-10-CM | POA: Diagnosis not present

## 2023-03-07 DIAGNOSIS — R7303 Prediabetes: Secondary | ICD-10-CM | POA: Diagnosis not present

## 2023-03-07 DIAGNOSIS — E6609 Other obesity due to excess calories: Secondary | ICD-10-CM | POA: Diagnosis not present

## 2023-03-07 DIAGNOSIS — M1811 Unilateral primary osteoarthritis of first carpometacarpal joint, right hand: Secondary | ICD-10-CM

## 2023-03-07 MED ORDER — TRIAMCINOLONE ACETONIDE 40 MG/ML IJ SUSP
40.0000 mg | Freq: Once | INTRAMUSCULAR | Status: AC
  Administered 2023-03-07: 40 mg via INTRAMUSCULAR

## 2023-03-07 NOTE — Assessment & Plan Note (Signed)
Increasing pain thumb basal joint right side negative Finkelstein sign. Injection as above, return to see me as needed.

## 2023-03-07 NOTE — Assessment & Plan Note (Signed)
We have been doing counterforce brace, Celebrex, home physical therapy, the tennis elbow has improved considerably, no longer tender over the lateral epicondyle. Continue home conditioning for now.

## 2023-03-07 NOTE — Progress Notes (Signed)
    Procedures performed today:    Procedure: Real-time Ultrasound Guided injection of the right thumb basal joint Device: Samsung [id number]  Verbal informed consent obtained.  Time-out conducted.  Noted no overlying erythema, induration, or other signs of local infection.  Skin prepped in a sterile fashion.  Local anesthesia: Topical Ethyl chloride.  With sterile technique and under real time ultrasound guidance: Arthritic joint noted, 1/2 cc lidocaine, 1/2 cc kenalog 40 injected easily. Completed without difficulty  Advised to call if fevers/chills, erythema, induration, drainage, or persistent bleeding.  Images permanently stored and available for review in PACS.  Impression: Technically successful ultrasound guided injection.  Independent interpretation of notes and tests performed by another provider:   None.  Brief History, Exam, Impression, and Recommendations:    Primary osteoarthritis of first carpometacarpal joint of one hand, right Increasing pain thumb basal joint right side negative Finkelstein sign. Injection as above, return to see me as needed.   Lateral epicondylitis, right elbow We have been doing counterforce brace, Celebrex, home physical therapy, the tennis elbow has improved considerably, no longer tender over the lateral epicondyle. Continue home conditioning for now.    ____________________________________________ Gwen Her. Dianah Field, M.D., ABFM., CAQSM., AME. Primary Care and Sports Medicine [facility]  Adjunct Professor of [employer]  Risk manager

## 2023-03-08 LAB — COMPLETE METABOLIC PANEL WITH GFR
AG Ratio: 1.7 (calc) (ref 1.0–2.5)
ALT: 19 U/L (ref 6–29)
AST: 20 U/L (ref 10–35)
Albumin: 4.3 g/dL (ref 3.6–5.1)
Alkaline phosphatase (APISO): 108 U/L (ref 37–153)
BUN: 14 mg/dL (ref 7–25)
CO2: 27 mmol/L (ref 20–32)
Calcium: 9.4 mg/dL (ref 8.6–10.4)
Chloride: 99 mmol/L (ref 98–110)
Creat: 0.97 mg/dL (ref 0.50–1.03)
Globulin: 2.6 g/dL (calc) (ref 1.9–3.7)
Glucose, Bld: 105 mg/dL — ABNORMAL HIGH (ref 65–99)
Potassium: 5 mmol/L (ref 3.5–5.3)
Sodium: 136 mmol/L (ref 135–146)
Total Bilirubin: 0.4 mg/dL (ref 0.2–1.2)
Total Protein: 6.9 g/dL (ref 6.1–8.1)
eGFR: 69 mL/min/{1.73_m2} (ref >=60)

## 2023-03-08 LAB — CBC
HCT: 42 % (ref 35.0–45.0)
Hemoglobin: 14 g/dL (ref 11.7–15.5)
MCH: 27.5 pg (ref 27.0–33.0)
MCHC: 33.3 g/dL (ref 32.0–36.0)
MCV: 82.4 fL (ref 80.0–100.0)
MPV: 11.7 fL (ref 7.5–12.5)
Platelets: 202 10*3/uL (ref 140–400)
RBC: 5.1 10*6/uL (ref 3.80–5.10)
RDW: 14.3 % (ref 11.0–15.0)
WBC: 7.1 10*3/uL (ref 3.8–10.8)

## 2023-03-08 LAB — LIPID PANEL W/REFLEX DIRECT LDL
Cholesterol: 183 mg/dL (ref <200)
HDL: 74 mg/dL (ref >=50)
LDL Cholesterol (Calc): 88 mg/dL (calc)
Non-HDL Cholesterol (Calc): 109 mg/dL (calc) (ref <130)
Total CHOL/HDL Ratio: 2.5 (calc) (ref <5.0)
Triglycerides: 110 mg/dL (ref <150)

## 2023-03-08 LAB — HIV ANTIBODY (ROUTINE TESTING W REFLEX): HIV 1&2 Ab, 4th Generation: NONREACTIVE

## 2023-03-08 LAB — HEPATITIS C ANTIBODY: Hepatitis C Ab: NONREACTIVE

## 2023-03-08 NOTE — Progress Notes (Signed)
HI [name],  Overall your labs look great.  We did do a hepatitis C and HIV screen as well because we did not have one on file for you and those are also negative.  Your cholesterol looks great.

## 2023-03-13 ENCOUNTER — Encounter: Payer: Self-pay | Admitting: Family Medicine

## 2023-03-13 NOTE — Telephone Encounter (Signed)
Referral was already placed on 3 6.  But I had placed it for [facility] she did request a specific location.  Will need to update referral for [facility] location.

## 2023-03-14 NOTE — Telephone Encounter (Signed)
Referral and clinical notes have been faxed to [facility] through [facility]. Office will contact patient to schedule referral appointment.

## 2023-03-15 ENCOUNTER — Ambulatory Visit: Payer: Medicare Other | Admitting: Gastroenterology

## 2023-03-15 ENCOUNTER — Encounter: Payer: Self-pay | Admitting: Gastroenterology

## 2023-03-15 VITALS — BP 142/92 | HR 90 | Ht 65.0 in | Wt 199.0 lb

## 2023-03-15 DIAGNOSIS — Z79899 Other long term (current) drug therapy: Secondary | ICD-10-CM | POA: Diagnosis not present

## 2023-03-15 DIAGNOSIS — K529 Noninfective gastroenteritis and colitis, unspecified: Secondary | ICD-10-CM

## 2023-03-15 DIAGNOSIS — D509 Iron deficiency anemia, unspecified: Secondary | ICD-10-CM | POA: Diagnosis not present

## 2023-03-15 DIAGNOSIS — K219 Gastro-esophageal reflux disease without esophagitis: Secondary | ICD-10-CM

## 2023-03-15 MED ORDER — COLESTIPOL HCL 1 G PO TABS: 1.0000 g | ORAL_TABLET | Freq: Two times a day (BID) | ORAL | 7 refills | Status: DC

## 2023-03-15 NOTE — Patient Instructions (Signed)
   _______________________________________________________  If your blood pressure at your visit was 140/90 or greater, please contact your primary care physician to follow up on this.  _______________________________________________________  If you are age 57 or older, your body mass index should be between 23-30. Your Body mass index is 33.12 kg/m. If this is out of the aforementioned range listed, please consider follow up with your Primary Care Provider.  If you are age 68 or younger, your body mass index should be between 19-25. Your Body mass index is 33.12 kg/m. If this is out of the aformentioned range listed, please consider follow up with your Primary Care Provider.   ________________________________________________________  The [facility] providers would like to encourage you to use [facility] to communicate with providers for non-urgent requests or questions.  Due to long hold times on the telephone, sending your provider a message by [facility] may be a faster and more efficient way to get a response.  Please allow 48 business hours for a response.  Please remember that this is for non-urgent requests.  _______________________________________________________  It was a pleasure to see you today!  Thank you for trusting me with your gastrointestinal care!

## 2023-03-15 NOTE — Progress Notes (Signed)
HPI :  57 year old female here for a follow-up visit for chronic diarrhea, GERD, B12 deficiency, iron deficiency. She also has a history of trigeminal neuralgia and chronic pain.   Recall we have been following her for history of significant loose stools with urgency, for which she has undergone a workup in recent years. She has tested negative for celiac disease, had a negative infectious work-up, had an EGD and colonoscopy with me in October 2022, results as below.  Tested negative for microscopic colitis and celiac as well as H. pylori.  No clear cause for her vitamin deficiencies.  We have done a capsule endoscopy since her last visit in March of last year, that was normal.  We had discussed options in the past with her.  Placed her on Colestid 1 g twice daily a few years ago.  This really did help reduce her stool frequency and generally she has been doing really well on the regimen and wanted to continue it.  She was also taking Imodium as needed.  I gave her Bentyl at the last visit for abdominal cramps and worsening stools, she does not recall taking it and has not thought she is needed it.  She is taking B12 and iron supplementation.  Her labs look good.  She does take omeprazole 40 mg daily for history of GERD and upset stomach.  She states this was working really well for her.  She has been hesitant to stop this in the past.  As suspected that potentially could be causing her B12 deficiency.  She normally does not take much NSAIDs.  She was given a course of Celebrex for some elbow discomfort.  She has Toradol to use as needed for severe pain for her trigeminal neuralgia but she tries to manage this with other modalities and does not take it much, has been on ketamine infusions for it.  On her current regimen she is having about 4 bowel movements per day.  Stools are soft, they are formed, no loose or watery stools.  She has been constipated in the past and does not like having that either.   She will use an Imodium if she is out of the house just to make sure she does not have to use the bathroom and that does help her when she takes it.  She is also found that drinking protein supplements if she uses them routinely can cause her to be constipated.  She usually drinks that every other day.  Between these things her bowels remain fairly well-controlled.  Prior workup: Colonoscopy 12/07/2016: - Non-thrombosed external hemorrhoids found on perianal exam. - One 3 mm polyp at the ileocecal valve, removed with a cold biopsy forceps. Resected and retrieved. - One 6 mm polyp at the hepatic flexure, removed with a cold snare. Resected and retrieved. - One 5 mm polyp in the descending colon, removed with a cold snare. Resected and retrieved. - Internal hemorrhoids. - 5 year colonoscopy recall - The examination was otherwise normal. No inflammatory changes - Retroflexion not performed due to small size of rectum. - Biopsies were taken with a cold forceps from the right colon   1. Surgical [P], ileocecal valve, hepatic flexure, descending, polyp (3) -TUBULAR ADENOMAS AND BENIGN COLONIC MUCOSA. -NO HIGH GRADE DYSPLASIA OR MALIGNANCY IDENTIFIED. 2. Surgical [P], random right colon -BENIGN COLONIC MUCOSA WITH NO HISTOPATHOLOGIC ABNORMALITY. -NO EVIDENCE OF LYMPHOCYTIC OR COLLAGENOUS COLITIS, INFLAMMATORY BOWEL DISEASE OR MALIGNANCY     CT abdomen / pelvis without contrast  07/28/21 - IMPRESSION: No acute findings or other significant abnormality.   GI pathogen panel negative Hgb 13.6, WBC 11.4 CRP 4.0 TTG and IgA normal   EGD 10/12/21 -  - A 3 cm hiatal hernia was present. - The exam of the esophagus was otherwise normal. No Barrett's. No focal stenosis / stricture. - A few small sessile polyps were found in the gastric body. Biopsies were taken with a cold forceps for histology as a representative sample to rule out adenomatous change. - Diffuse mildly erythematous mucosa was found  in the gastric antrum. - The exam of the stomach was otherwise normal. - Biopsies were taken with a cold forceps in the gastric body, at the incisura and in the gastric antrum for Helicobacter pylori testing. - Patchy mildly erythematous mucosa was found in the second portion of the duodenum. - The exam of the duodenum was otherwise normal. - Biopsies for histology were taken with a cold forceps in the duodenal bulb and in the second portion of the duodenum for evaluation of celiac disease.   Colonoscopy 10/12/21 - The perianal and digital rectal examinations were normal. - The terminal ileum appeared normal. - Two sessile polyps were found in the ascending colon. The polyps were 3 to 4 mm in size. These polyps were removed with a cold snare. Resection and retrieval were complete. - A single medium-mouthed diverticulum was found in the ascending colon. - Internal hemorrhoids were found during retroflexion. The hemorrhoids were small. - The exam was otherwise without abnormality. Several minutes spent lavaging the colon to achieve adequate views. - Biopsies for histology were taken with a cold forceps from the right colon, left colon and transverse colon for evaluation of microscopic colitis.   1. Surgical [P], small bowel - DUODENAL MUCOSA WITH FOCAL EROSION AND MILD ASSOCIATED INFLAMMATION. - NO FEATURES OF CELIAC SPRUE OR GRANULOMAS. 2. Surgical [P], gastric antrum and gastric body - ANTRAL AND OXYNTIC MUCOSA WITH MILD CHANGES OF REACTIVE GASTROPATHY. Desiree Wilson NEGATIVE FOR HELICOBACTER PYLORI. - NO INTESTINAL METAPLASIA, DYSPLASIA OR CARCINOMA. 3. Surgical [P], gastric polyps - FUNDIC GLAND POLYP. - NO INTESTINAL METAPLASIA, ADENOMATOUS CHANGE OR CARCINOMA. 4. Surgical [P], colon, ascending, polyp (2) - TUBULAR ADENOMA(S). - NO HIGH GRADE DYSPLASIA OR CARCINOMA. - INFLAMMATORY POLYP. 5. Surgical [P], random sites, left colon - UNREMARKABLE COLONIC MUCOSA. - NO  MICROSCOPIC COLITIS, ACTIVE INFLAMMATION OR CHRONIC CHANGES. Desiree Wilson     Capsule endoscopy 03/22/22: normal   Past Medical History:  Diagnosis Date   Allergy    rhinitis   Anemia    Anxiety    Blood transfusion    x2 1983 surgery blood loss   Blood transfusion without reported diagnosis    Depression    Depression    Facial paralysis    right face/ chronic pain syndrome (pain management Dr Sydell Axon)   [id number])    Hyperlipidemia    Hypotension    Iron deficiency anemia 11/15/2014   Iron malabsorption 11/15/2014   Osteoarthritis    Trigeminal neuralgia    right   Trigeminal neuralgia 2002     Past Surgical History:  Procedure Laterality Date   COLONOSCOPY     ESOPHAGOGASTRODUODENOSCOPY     gamma knife Right 01/2018   MANDIBLE SURGERY     NASAL SINUS SURGERY     OSTEOTOMY     plastic facial surgery     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   Family History  Problem Relation Age of  Onset   Breast cancer Mother        lumpectomy and radiation   Obesity Mother    Stroke Father    Diverticulitis Father    Hypertension Father    Hyperlipidemia Father    Depression Father    Alzheimer's disease Father    Colon cancer Neg Hx    Stomach cancer Neg Hx    Esophageal cancer Neg Hx    Pancreatic cancer Neg Hx    Social History   Tobacco Use   Smoking status: Some Days    Years: 6    Types: Cigarettes    Last attempt to quit: 12/31/2000    Years since quitting: 22.2   Smokeless tobacco: Never   Tobacco comments:    Social smoker   Vaping Use   Vaping Use: Never used  Substance Use Topics   Alcohol use: No    Alcohol/week: 0.0 standard drinks of alcohol   Drug use: No   Current Outpatient Medications  Medication Sig Dispense Refill   AMBULATORY NON FORMULARY MEDICATION Ketamine nose spray: use 2 sprays prn pain, up to 10 sprays a day     AMBULATORY NON FORMULARY MEDICATION Medication Name: Align Probiotic 1 Bottle  11   Carboxymethylcellulose Sodium 1 % GEL Apply to eye as needed.     celecoxib (CELEBREX) 200 MG capsule One to 2 tablets by mouth daily as needed for pain. 60 capsule 2   clonazePAM (KLONOPIN) 1 MG tablet Take 1 tablet (1 mg total) by mouth 2 (two) times daily. 30 tablet 2   colestipol (COLESTID) 1 g tablet Take 1 tablet (1 g total) by mouth 2 (two) times daily. 180 tablet 3   dicyclomine (BENTYL) 10 MG capsule Take 1 capsule (10 mg total) by mouth every 8 (eight) hours as needed for spasms. 30 capsule 3   DULoxetine (CYMBALTA) 30 MG capsule TAKE 1 CAPSULE(30 MG) BY MOUTH IN THE MORNING 30 capsule 0   DULoxetine (CYMBALTA) 60 MG capsule TAKE 1 CAPSULE(60 MG) BY MOUTH DAILY 90 capsule 1   estradiol (ESTRACE) 0.1 MG/GM vaginal cream Place 1 Applicatorful vaginally at bedtime. X 7 days, then 3 x a week 85 g 3   ferrous fumarate (HEMOCYTE - 106 MG FE) 325 (106 Fe) MG TABS tablet Take 1 tablet by mouth every other day.     fluticasone (FLONASE) 50 MCG/ACT nasal spray Place 2 sprays into both nostrils daily. 48 g 3   folic acid (FOLVITE) 1 MG tablet Take 1 mg by mouth daily.     frovatriptan (FROVA) 2.5 MG tablet Take 2.5 mg by mouth daily as needed.     gabapentin (NEURONTIN) 600 MG tablet Take 600 mg by mouth 3 (three) times daily.     ketamine (KETALAR) 10 MG/ML injection Inject into the vein every 3 (three) months.      ketorolac (TORADOL) 10 MG tablet Take 10 mg by mouth every 8 (eight) hours as needed for severe pain.     loperamide (IMODIUM A-D) 2 MG tablet Take 1 tablet (2 mg total) by mouth in the morning. May take again later in the day as needed 30 tablet 0   Multiple Vitamin (MULTIVITAMIN) capsule Take 1 capsule by mouth daily.     omeprazole (PRILOSEC) 40 MG capsule TAKE 1 CAPSULE BY MOUTH DAILY 90 capsule 3   Oxcarbazepine (TRILEPTAL) 300 MG tablet Take 300 mg by mouth 2 (two) times daily.     Probiotic Product (PROBIOTIC PO) Take by  mouth.     promethazine (PHENERGAN) 25 MG tablet  Take 1 tablet (25 mg total) by mouth every 8 (eight) hours as needed for nausea or vomiting. 20 tablet 0   rosuvastatin (CRESTOR) 20 MG tablet Take 1 tablet (20 mg total) by mouth daily. 90 tablet 3   tiZANidine (ZANAFLEX) 4 MG tablet Take 4 mg by mouth every 6 (six) hours as needed.     traZODone (DESYREL) 100 MG tablet TAKE 2 TABLETS(200 MG) BY MOUTH AT BEDTIME AS NEEDED FOR SLEEP 180 tablet 1   vitamin B-12 (CYANOCOBALAMIN) 1000 MCG tablet Take 1,000 mcg by mouth daily.     vitamin C (ASCORBIC ACID) 500 MG tablet Take 500 mg by mouth daily.     No current facility-administered medications for this visit.   Allergies  Allergen Reactions   Azithromycin Rash    REACTION: C Diff Other reaction(s): Other (See Comments), Other (See Comments) REACTION: C Diff REACTION: C Diff REACTION: C Diff    Methadone     Other reaction(s): Hallucinations, Other (See Comments), Other (See Comments) Hallucinations Hallucinations Hallucinations    Tapentadol Anxiety    Other reaction(s): Dizziness   Phenobarbital     REACTION: Excited   Wellbutrin [Bupropion] Other (See Comments)    Not effective.      Review of Systems: All systems reviewed and negative except where noted in HPI.   Lab Results  Component Value Date   WBC 7.1 03/07/2023   HGB 14.0 03/07/2023   HCT 42.0 03/07/2023   MCV 82.4 03/07/2023   PLT 202 03/07/2023    Lab Results  Component Value Date   CREATININE 0.97 03/07/2023   BUN 14 03/07/2023   NA 136 03/07/2023   K 5.0 03/07/2023   CL 99 03/07/2023   CO2 27 03/07/2023    Lab Results  Component Value Date   ALT 19 03/07/2023   AST 20 03/07/2023   ALKPHOS 86 09/27/2021   BILITOT 0.4 03/07/2023     Physical Exam: BP (!) 156/84   Pulse 90   Ht 5\' 5"  (1.651 m)   Wt 199 lb (90.3 kg)   BMI 33.12 kg/m  Constitutional: Pleasant,well-developed, female in no acute distress. Neurological: Alert and oriented to person place and time. Psychiatric: Normal mood  and affect. Behavior is normal.   ASSESSMENT: 57 y.o. female here for assessment of the following  1. Chronic diarrhea   2. Iron deficiency anemia, unspecified iron deficiency anemia type   3. Gastroesophageal reflux disease, unspecified whether esophagitis present   4. Long-term current use of proton pump inhibitor therapy    Overall she is really happy with Colestid.  This regimen has worked with her in combination with Imodium as needed and some modifications to her diet.  Her lipid panel shows no problems with triglycerides, she is tolerating the Colestid well.  We will continue that for now.  She will continue to avoid trigger foods, and she does like to drink protein shakes for her nutrition and this also does slow down her bowels which works well for her.  We again reviewed her vitamin deficiencies, she has had an extensive workup for iron deficiency without any concerning findings.  B12 deficiency could be due to PPI.  She will continue omeprazole at present dosing for now.  We did discuss long-term risk benefits, she understands, she wishes to continue understanding those risks, it is working well for her symptoms.  She should minimize her NSAID use moving forward.  She  should follow-up in 1 year or sooner with any problems.   PLAN: - continue colestid 1gm BID - continue immodium PRN - using protein shakes PRN which slow down bowels as well - reassured her of prior workup - B12 deficiency could be due to PPI - continue omeprazole 40mg  / day - counseled on risks / benefits - minimize NSAID use - f/u one year or sooner with issues  Desiree Mango, MD [facility]

## 2023-03-18 ENCOUNTER — Ambulatory Visit: Payer: Medicare Other | Admitting: Family Medicine

## 2023-03-19 ENCOUNTER — Ambulatory Visit: Payer: Medicare Other | Admitting: Psychology

## 2023-03-27 ENCOUNTER — Other Ambulatory Visit: Payer: Self-pay | Admitting: Family Medicine

## 2023-03-27 DIAGNOSIS — Z1231 Encounter for screening mammogram for malignant neoplasm of breast: Secondary | ICD-10-CM

## 2023-04-04 ENCOUNTER — Ambulatory Visit: Payer: Medicare Other | Admitting: Family Medicine

## 2023-04-08 DIAGNOSIS — G501 Atypical facial pain: Secondary | ICD-10-CM | POA: Diagnosis not present

## 2023-04-08 DIAGNOSIS — Z5181 Encounter for therapeutic drug level monitoring: Secondary | ICD-10-CM | POA: Diagnosis not present

## 2023-04-08 DIAGNOSIS — Z79899 Other long term (current) drug therapy: Secondary | ICD-10-CM | POA: Diagnosis not present

## 2023-04-08 DIAGNOSIS — G894 Chronic pain syndrome: Secondary | ICD-10-CM | POA: Diagnosis not present

## 2023-04-08 DIAGNOSIS — Z79891 Long term (current) use of opiate analgesic: Secondary | ICD-10-CM | POA: Diagnosis not present

## 2023-04-08 DIAGNOSIS — G500 Trigeminal neuralgia: Secondary | ICD-10-CM | POA: Diagnosis not present

## 2023-04-16 ENCOUNTER — Ambulatory Visit (INDEPENDENT_AMBULATORY_CARE_PROVIDER_SITE_OTHER): Payer: Medicare Other | Admitting: Psychology

## 2023-04-16 DIAGNOSIS — F411 Generalized anxiety disorder: Secondary | ICD-10-CM | POA: Diagnosis not present

## 2023-04-16 DIAGNOSIS — F331 Major depressive disorder, recurrent, moderate: Secondary | ICD-10-CM

## 2023-04-16 NOTE — Progress Notes (Signed)
[facility] Counselor/Therapist Progress Note  Patient ID: LAYZA SUMMA, MRN: [id number],    Date: 04/16/2023  Time Spent: 9:00am-9:50am   50 minutes   Treatment Type: Individual Therapy  Reported Symptoms: stress, pain  Mental Status Exam: Appearance:  Casual     Behavior: Appropriate  Motor: Normal  Speech/Language:  Normal Rate  Affect: Appropriate  Mood: normal  Thought process: normal  Thought content:   WNL  Sensory/Perceptual disturbances:   WNL  Orientation: oriented to person, place, time/date, and situation  Attention: Good  Concentration: Good  Memory: WNL  Fund of knowledge:  Good  Insight:   Good  Judgment:  Good  Impulse Control: Good   Risk Assessment: Danger to Self:  No Self-injurious Behavior: No Danger to Others: No Duty to Warn:no Physical Aggression / Violence:No  Access to Firearms a concern: No  Gang Involvement:No   Subjective: Pt present for face-to-face individual therapy via video Webex.  Pt consents to telehealth video session due to COVID 19 pandemic. Location of pt: home Location of therapist: home office.   Pt talked about her pain.   She is scheduled for a ketamine infusion this week.  Pt is hoping that this will help control her pain.  Pt is feeling badly that she does not "feel useful" bc she is in so much pain and has to be in bed a lot when the pain is bad. Helped pt process her feelings. Worked on thought reframing.  Pt talked about her brother inviting her to his beach house.  Pt got a speeding ticket driving to the beach and when she got to the beach house she fell on the stairs.  Addressed how frustrating and stressful this was for pt.  Pt was anxious about the drive home.   Worked on self care strategies. Provided supportive therapy.    Interventions: Cognitive Behavioral Therapy and Insight-Oriented  Diagnosis:  F33.1 and F41.1  Plan: Plan of Care: Recommend ongoing therapy.   Pt participated with setting  treatment goals.   Pt states her goals for therapy are to have a place to process her feelings.   Pt wants to improve coping skills.   Plan to meet every month.    Treatment Plan (Treatment Plan Target Date: 2/29/2025) Client Abilities/Strengths  Pt is bright, engaging, and motivated for therapy.  Client Treatment Preferences  Individual therapy.  Client Statement of Needs  Improve copings skills and have a safe place to talk about feelings.   Symptoms  Depressed or irritable mood. Excessive and/or unrealistic worry that is difficult to control occurring more days than not for at least 6 months about a number of events or activities. Hypervigilance (e.g., feeling constantly on edge, experiencing concentration difficulties, having trouble falling or staying asleep, exhibiting a general state of irritability).  Problems Addressed  Unipolar Depression, Anxiety Goals 1. Alleviate depressive symptoms and return to previous level of effective functioning. 2. Appropriately grieve the loss in order to normalize mood and to return to previously adaptive level of functioning. Objective Learn and implement behavioral strategies to overcome depression. Target Date: 2025-02-29 Frequency: Monthly  Progress: 40 Modality: individual  Related Interventions Assist the client in developing skills that increase the likelihood of deriving pleasure from behavioral activation (e.g., assertiveness skills, developing an exercise plan, less internal/more external focus, increased social involvement); reinforce success. Engage the client in "behavioral activation," increasing his/her activity level and contact with sources of reward, while identifying processes that inhibit activation. use behavioral techniques such  as instruction, rehearsal, role-playing, role reversal, as needed, to facilitate activity in the client's daily life; reinforce success. 3. Develop healthy interpersonal relationships that lead to the  alleviation and help prevent the relapse of depression. 4. Develop healthy thinking patterns and beliefs about self, others, and the world that lead to the alleviation and help prevent the relapse of depression. 5. Enhance ability to effectively cope with the full variety of life's worries and anxieties. 6. Learn and implement coping skills that result in a reduction of anxiety and worry, and improved daily functioning. Objective Learn and implement problem-solving strategies for realistically addressing worries. Target Date: 2025-02-29 Frequency: Monthly  Progress: 40 Modality: individual  Related Interventions Assign the client a homework exercise in which he/she problem-solves a current problem (see Mastery of Your Anxiety and Worry: Workbook by Elenora Fender and Filbert Schilder or Generalized Anxiety Disorder by Elesa Hacker, and Filbert Schilder); review, reinforce success, and provide corrective feedback toward improvement. Teach the client problem-solving strategies involving specifically defining a problem, generating options for addressing it, evaluating the pros and cons of each option, selecting and implementing an optional action, and reevaluating and refining the action. Objective Learn and implement calming skills to reduce overall anxiety and manage anxiety symptoms. Target Date: 2025-02-29 Frequency: Monthly  Progress: 40 Modality: individual  Related Interventions Assign the client to read about progressive muscle relaxation and other calming strategies in relevant books or treatment manuals (e.g., Progressive Relaxation Training by Twana First; Mastery of Your Anxiety and Worry: Workbook by Earlie Counts). Assign the client homework each session in which he/she practices relaxation exercises daily, gradually applying them progressively from non-anxiety-provoking to anxiety-provoking situations; review and reinforce success while providing corrective feedback toward improvement. Teach the  client calming/relaxation skills (e.g., applied relaxation, progressive muscle relaxation, cue controlled relaxation; mindful breathing; biofeedback) and how to discriminate better between relaxation and tension; teach the client how to apply these skills to his/her daily life. 7. Recognize, accept, and cope with feelings of depression. 8. Reduce overall frequency, intensity, and duration of the anxiety so that daily functioning is not impaired. 9. Resolve the core conflict that is the source of anxiety. 10. Stabilize anxiety level while increasing ability to function on a daily basis. Diagnosis Axis none F33.1 Major Depressive Disorder  Axis none F41.1 General Anxiety Disorder  Conditions For Discharge Achievement of treatment goals and objectives   Salomon Fick, LCSW

## 2023-04-17 ENCOUNTER — Other Ambulatory Visit: Payer: Self-pay | Admitting: Family Medicine

## 2023-04-17 ENCOUNTER — Other Ambulatory Visit: Payer: Self-pay | Admitting: Pharmacist

## 2023-04-17 ENCOUNTER — Telehealth: Payer: Medicare Other

## 2023-04-17 DIAGNOSIS — J0100 Acute maxillary sinusitis, unspecified: Secondary | ICD-10-CM

## 2023-04-17 NOTE — Progress Notes (Signed)
04/17/2023 Name: Desiree Wilson MRN: [id number] DOB: 09-Aug-1966  No chief complaint on file.   Desiree Wilson is a 57 y.o. year old female who presented for a telephone visit.   They were referred to the pharmacist by  [facility]  for assistance in managing  HLD . Patient also brings up concerns about blood pressure.     Subjective:  Care Team: Primary Care Provider: Agapito Games, MD ; Next Scheduled Visit: 04/30/23   Medication Access/Adherence  Current Pharmacy:  [id number] - [place], [place] - [address] [place]e Wilson [id number] Phone: [phone] Fax: [phone]  [facility] - [place], [place] - [address] [place] [address] Phone: [phone] Fax: [phone]    Hypertension:  Current medications: none at present, this is not a formal diagnosis for her yet  Patient does not have a validated, automated, upper arm home BP cuff  Patient denies hypotensive s/sx including dizziness, lightheadedness.  Patient denies hypertensive symptoms including headache, chest pain, shortness of breath   Hyperlipidemia/ASCVD Risk Reduction  Current lipid lowering medications: rosuvastatin  daily   Objective:  Lab Results  Component Value Date   HGBA1C 5.8 (H) 05/11/2021    Lab Results  Component Value Date   CREATININE 0.97 03/07/2023   BUN 14 03/07/2023   NA 136 03/07/2023   K 5.0 03/07/2023   CL 99 03/07/2023   CO2 27 03/07/2023    Lab Results  Component Value Date   CHOL 183 03/07/2023   HDL 74 03/07/2023   LDLCALC 88 03/07/2023   TRIG 110 03/07/2023   CHOLHDL 2.5 03/07/2023    Medications Reviewed Today     Reviewed by Gabriel Carina, RPH (Pharmacist) on 04/17/23 at 1126  Med List Status: <None>   Medication Order Taking? Sig Documenting Provider Last Dose Status Informant  AMBULATORY NON FORMULARY MEDICATION  [id number]  Ketamine nose spray: use 2 sprays prn pain, up to 10 sprays a day [provider]  Active   Kipp Laurence MEDICATION [id number]  Medication Name: Align Probiotic Agapito Games, MD  Active   Carboxymethylcellulose Sodium 1 % GEL [id number]  Apply to eye as needed. [provider]  Active   celecoxib (CELEBREX) 200 MG capsule [id number]  One to 2 tablets by mouth daily as needed for pain. Monica Becton, MD  Active   clonazePAM Scarlette Calico) 1 MG tablet [id number]  Take 1 tablet (1 mg total) by mouth 2 (two) times daily. Bowen, Scot Jun, DO  Active   colestipol (COLESTID) 1 g tablet [id number]  Take 1 tablet (1 g total) by mouth 2 (two) times daily. Benancio Deeds, MD  Active   DULoxetine (CYMBALTA) 30 MG capsule [id number]  TAKE 1 CAPSULE(30 MG) BY MOUTH IN THE MORNING Agapito Games, MD  Active   DULoxetine (CYMBALTA) 60 MG capsule [id number]  TAKE 1 CAPSULE(60 MG) BY MOUTH DAILY Agapito Games, MD  Active   estradiol (ESTRACE) 0.1 MG/GM vaginal cream [id number]  Place 1 Applicatorful vaginally at bedtime. X 7 days, then 3 x a week Agapito Games, MD  Active   ferrous fumarate (HEMOCYTE - 106 MG FE) 325 (106 Fe) MG TABS tablet [id number]  Take 1 tablet by mouth every other day. [provider]  Active   fluticasone (FLONASE) 50 MCG/ACT nasal spray [id number]  Place 2 sprays into both nostrils daily. Agapito Games, MD  Active   folic acid (FOLVITE) 1 MG tablet [id number]  Take 1 mg by mouth daily. [provider]  Active   frovatriptan (FROVA) 2.5 MG tablet [id number]  Take 2.5 mg by mouth daily as needed. [provider]  Active   gabapentin (NEURONTIN) 600 MG tablet [id number]  Take 600 mg by mouth 3 (three) times daily. [provider]  Active   ketamine (KETALAR) 10 MG/ML injection [id number]  Inject into the vein every 3 (three) months.  [provider]  Active            Med Note  Laureen Ochs, TONYA L   Wed Apr 27, 2020 10:58 AM)    ketorolac (TORADOL) 10 MG tablet [id number]  Take 10 mg by mouth every 8 (eight) hours as needed for severe pain. [provider]  Active   loperamide (IMODIUM A-D) 2 MG tablet [id number]  Take 1 tablet (2 mg total) by mouth in the morning. May take again later in the day as needed Armbruster, Willaim Rayas, MD  Active   Multiple Vitamin (MULTIVITAMIN) capsule [id number]  Take 1 capsule by mouth daily. [provider]  Active   omeprazole (PRILOSEC) 40 MG capsule [id number]  TAKE 1 CAPSULE BY MOUTH DAILY Agapito Games, MD  Active   Oxcarbazepine (TRILEPTAL) 300 MG tablet [id number]  Take 300 mg by mouth 2 (two) times daily. [provider]  Active   Probiotic Product (PROBIOTIC PO) [id number]  Take by mouth. [provider]  Active   promethazine (PHENERGAN) 25 MG tablet [id number]  Take 1 tablet (25 mg total) by mouth every 8 (eight) hours as needed for nausea or vomiting. Agapito Games, MD  Active   rosuvastatin (CRESTOR) 20 MG tablet [id number] Yes Take 1 tablet (20 mg total) by mouth daily. Agapito Games, MD Taking Active   tiZANidine (ZANAFLEX) 4 MG tablet [id number]  Take 4 mg by mouth every 6 (six) hours as needed. [provider]  Active            Med Note Laureen Ochs, Barbie Haggis   Wed Apr 27, 2020 10:58 AM)    traZODone (DESYREL) 100 MG tablet [id number]  TAKE 2 TABLETS(200 MG) BY MOUTH AT BEDTIME AS NEEDED FOR SLEEP Agapito Games, MD  Active   vitamin B-12 (CYANOCOBALAMIN) 1000 MCG tablet [id number]  Take 1,000 mcg by mouth daily. [provider]  Active   vitamin C (ASCORBIC ACID) 500 MG tablet [id number]  Take 500 mg by mouth daily. [provider]  Active   Med List Note Linford Arnold, Barbarann Ehlers, MD 01/14/17 1333):                Assessment/Plan:   Hypertension: - Currently unknown control, though patient has noticed elevated readings in office visits and  states this is concerning for her. She does relate elevated BP to her pain. She wants to be proactive and she states she will be buying a home BP cuff. Counseled patient to obtain upper arm BP cuff, omron brand, and check pressures at home prior to upcoming PCP visit. - Reviewed long term cardiovascular and renal outcomes of uncontrolled blood pressure - Reviewed appropriate blood pressure monitoring technique and reviewed goal blood pressure. Recommended to check home blood pressure and heart rate 2-3x per week - Recommend to obtain home BP cuff, document readings, and discuss at upcoming PCP visit.    Hyperlipidemia/ASCVD Risk Reduction: -  Currently controlled.  - Recommend to continue current regimen    Follow Up Plan: PRN  Lynnda Shields, PharmD, BCPS Clinical Pharmacist [facility]

## 2023-04-17 NOTE — Patient Instructions (Signed)
Desiree Wilson,  Thank you for speaking with me today. As discussed, if you are interested in getting the upper arm blood pressure cuff from [facility], choose the "omron" brand, the one that is ~$39.99. (You do not need the $140 one!)  Here are some helpful tips on checking blood pressure at home:  Check your blood pressure twice weekly, and any time you have concerning symptoms like headache, chest pain, dizziness, shortness of breath, or vision changes. It may also be helpful to check for any differences between when your pain is more bothersome, versus when it is better controlled.  Our goal is less than 140/90.  To appropriately check your blood pressure, make sure you do the following:  1) Avoid caffeine, exercise, or tobacco products for 30 minutes before checking. Empty your bladder. 2) Sit with your back supported in a flat-backed chair. Rest your arm on something flat (arm of the chair, table, etc). 3) Sit still with your feet flat on the floor, resting, for at least 5 minutes.  4) Check your blood pressure. Take 1-2 readings.  5) Write down these readings and bring with you to any provider appointments.  Bring your home blood pressure machine with you to a provider's office for accuracy comparison at least once a year.   Take care, Desiree Wilson, PharmD, BCPS Clinical Pharmacist [facility]

## 2023-04-18 ENCOUNTER — Ambulatory Visit: Payer: Medicare Other | Admitting: Sports Medicine

## 2023-04-24 DIAGNOSIS — G500 Trigeminal neuralgia: Secondary | ICD-10-CM | POA: Diagnosis not present

## 2023-04-24 DIAGNOSIS — Z79891 Long term (current) use of opiate analgesic: Secondary | ICD-10-CM | POA: Diagnosis not present

## 2023-04-24 DIAGNOSIS — G501 Atypical facial pain: Secondary | ICD-10-CM | POA: Diagnosis not present

## 2023-04-24 DIAGNOSIS — G894 Chronic pain syndrome: Secondary | ICD-10-CM | POA: Diagnosis not present

## 2023-04-25 ENCOUNTER — Encounter: Payer: Medicare Other | Admitting: Family Medicine

## 2023-04-30 ENCOUNTER — Ambulatory Visit: Payer: Medicare Other | Admitting: Family Medicine

## 2023-05-01 ENCOUNTER — Ambulatory Visit (INDEPENDENT_AMBULATORY_CARE_PROVIDER_SITE_OTHER): Payer: Medicare Other

## 2023-05-01 DIAGNOSIS — Z1231 Encounter for screening mammogram for malignant neoplasm of breast: Secondary | ICD-10-CM | POA: Diagnosis not present

## 2023-05-02 NOTE — Progress Notes (Signed)
Please call patient. Normal mammogram.  Repeat in 1 year.  

## 2023-05-03 ENCOUNTER — Other Ambulatory Visit (INDEPENDENT_AMBULATORY_CARE_PROVIDER_SITE_OTHER): Payer: Medicare Other

## 2023-05-03 ENCOUNTER — Other Ambulatory Visit: Payer: Self-pay | Admitting: Family Medicine

## 2023-05-03 ENCOUNTER — Ambulatory Visit (INDEPENDENT_AMBULATORY_CARE_PROVIDER_SITE_OTHER): Payer: Medicare Other | Admitting: Sports Medicine

## 2023-05-03 DIAGNOSIS — M1811 Unilateral primary osteoarthritis of first carpometacarpal joint, right hand: Secondary | ICD-10-CM

## 2023-05-03 DIAGNOSIS — M65311 Trigger thumb, right thumb: Secondary | ICD-10-CM

## 2023-05-03 DIAGNOSIS — M7711 Lateral epicondylitis, right elbow: Secondary | ICD-10-CM | POA: Diagnosis not present

## 2023-05-03 DIAGNOSIS — Z1231 Encounter for screening mammogram for malignant neoplasm of breast: Secondary | ICD-10-CM

## 2023-05-03 NOTE — Assessment & Plan Note (Signed)
New onset triggering right thumb, historically we treated her thumb basal joint arthritis with a CMC injection back in March, as she is having triggering today we will do a FPL tendon sheath injection, home physical therapy given, return to see me in 6 weeks.

## 2023-05-03 NOTE — Progress Notes (Signed)
    Procedures performed today:    Procedure: Real-time Ultrasound Guided injection of the right flexor pollicis longus tendon sheath Device: Samsung HS60  Verbal informed consent obtained.  Time-out conducted.  Noted no overlying erythema, induration, or other signs of local infection.  Skin prepped in a sterile fashion.  Local anesthesia: Topical Ethyl chloride.  With sterile technique and under real time ultrasound guidance: Noted FPL tendon sheath nodule, 1/2 cc lidocaine, 1/2 cc kenalog 40 injected easily. Completed without difficulty  Advised to call if fevers/chills, erythema, induration, drainage, or persistent bleeding.  Images permanently stored and available for review in PACS.  Impression: Technically successful ultrasound guided injection.  Independent interpretation of notes and tests performed by another provider:   None.  Brief History, Exam, Impression, and Recommendations:    Trigger thumb, right thumb New onset triggering right thumb, historically we treated her thumb basal joint arthritis with a CMC injection back in March, as she is having triggering today we will do a FPL tendon sheath injection, home physical therapy given, return to see me in 6 weeks.  Primary osteoarthritis of first carpometacarpal joint of one hand, right Pain resolved after first CMC injection in March.  Lateral epicondylitis, right elbow Resolved with conservative treatment, counterforce brace, Celebrex and home PT. Return to see me as needed for this.    ____________________________________________ Ihor Austin. Benjamin Stain, M.D., ABFM., CAQSM., AME. Primary Care and Sports Medicine [facility]  Adjunct Professor of Family Medicine  [employer]  Restaurant manager, fast food

## 2023-05-03 NOTE — Assessment & Plan Note (Signed)
Pain resolved after first CMC injection in March.

## 2023-05-03 NOTE — Assessment & Plan Note (Signed)
Resolved with conservative treatment, counterforce brace, Celebrex and home PT. Return to see me as needed for this.

## 2023-05-06 ENCOUNTER — Encounter: Payer: Self-pay | Admitting: Family Medicine

## 2023-05-06 ENCOUNTER — Ambulatory Visit (INDEPENDENT_AMBULATORY_CARE_PROVIDER_SITE_OTHER): Payer: Medicare Other | Admitting: Family Medicine

## 2023-05-06 VITALS — BP 170/101 | HR 84 | Ht 65.0 in | Wt 183.0 lb

## 2023-05-06 DIAGNOSIS — F325 Major depressive disorder, single episode, in full remission: Secondary | ICD-10-CM

## 2023-05-06 DIAGNOSIS — K648 Other hemorrhoids: Secondary | ICD-10-CM

## 2023-05-06 DIAGNOSIS — L723 Sebaceous cyst: Secondary | ICD-10-CM | POA: Diagnosis not present

## 2023-05-06 DIAGNOSIS — R030 Elevated blood-pressure reading, without diagnosis of hypertension: Secondary | ICD-10-CM | POA: Diagnosis not present

## 2023-05-06 DIAGNOSIS — N182 Chronic kidney disease, stage 2 (mild): Secondary | ICD-10-CM | POA: Diagnosis not present

## 2023-05-06 DIAGNOSIS — F329 Major depressive disorder, single episode, unspecified: Secondary | ICD-10-CM

## 2023-05-06 DIAGNOSIS — J3489 Other specified disorders of nose and nasal sinuses: Secondary | ICD-10-CM

## 2023-05-06 DIAGNOSIS — G500 Trigeminal neuralgia: Secondary | ICD-10-CM

## 2023-05-06 DIAGNOSIS — F331 Major depressive disorder, recurrent, moderate: Secondary | ICD-10-CM | POA: Insufficient documentation

## 2023-05-06 MED ORDER — DULOXETINE HCL 30 MG PO CPEP: ORAL_CAPSULE | ORAL | 1 refills | Status: DC

## 2023-05-06 MED ORDER — DULOXETINE HCL 60 MG PO CPEP: 60.0000 mg | ORAL_CAPSULE | Freq: Every evening | ORAL | 1 refills | Status: DC

## 2023-05-06 NOTE — Patient Instructions (Addendum)
Please stop the decongestant.   2 week follow up with nurse for BP check. 

## 2023-05-06 NOTE — Assessment & Plan Note (Signed)
Continue to follow renal function Q 6 mo.   

## 2023-05-06 NOTE — Assessment & Plan Note (Signed)
Please stop the decongestant.   2 week follow up with nurse for BP check.

## 2023-05-06 NOTE — Progress Notes (Signed)
Established Patient Office Visit  Subjective   Patient ID: Desiree Wilson, female    DOB: [birth year]  Age: 57 y.o. MRN: [id number]  Chief Complaint  Patient presents with   Hypertension    HPI  She has had a bulging hemorrhoids. It was painful and bleeding, but treated with preparation H and witch hazel. It is no longer painful but still a little swollen.  Has been using a stool softener.   Having a lot of nasal congestion with her seasonal allergies  Having bouts of diarrhea and constipation. Rarely take a narcotic.    Migraines are infrequent.   They really have gotten better.   Had injection on her thumb for trigger finger and her pain is better so she si off the celebrex.   Feels like the right side of her sinuses are blocked up. She has been usign her nasal steroid spray. Has ENT appt coming up.   Plans on joining the [facility].     ROS    Objective:     BP (!) 170/101   Pulse 84   Ht 5\' 5"  (1.651 m)   Wt 183 lb (83 kg)   SpO2 99%   BMI 30.45 kg/m    Physical Exam Vitals and nursing note reviewed.  Constitutional:      Appearance: She is well-developed.  HENT:     Head: Normocephalic and atraumatic.  Cardiovascular:     Rate and Rhythm: Normal rate and regular rhythm.     Heart sounds: Normal heart sounds.  Pulmonary:     Effort: Pulmonary effort is normal.     Breath sounds: Normal breath sounds.  Skin:    General: Skin is warm and dry.  Neurological:     Mental Status: She is alert and oriented to person, place, and time.  Psychiatric:        Behavior: Behavior normal.      No results found for any visits on 05/06/23.    The 10-year ASCVD risk score (Arnett DK, et al., 2019) is: 6.3%    Assessment & Plan:   Problem List Items Addressed This Visit       Nervous and Auditory   Trigeminal neuralgia    Back on Ketamine infusions and doing well. Was finally able to get approved with the insurance.      Relevant Medications    DULoxetine (CYMBALTA) 30 MG capsule   DULoxetine (CYMBALTA) 60 MG capsule     Genitourinary   CKD (chronic kidney disease) stage 2, GFR 60-89 ml/min    Continue to follow renal function Q 6 mo .         Other   Moderate episode of recurrent major depressive disorder (HCC)    Happy with current regimen of Cymbalta. Hoping she can go back down if she is still doing well at follow-up. Meds refilled.       Relevant Medications   DULoxetine (CYMBALTA) 30 MG capsule   DULoxetine (CYMBALTA) 60 MG capsule   Elevated BP without diagnosis of hypertension    Please stop the decongestant.   2 week follow up with nurse for BP check.      Depression   Relevant Medications   DULoxetine (CYMBALTA) 30 MG capsule   DULoxetine (CYMBALTA) 60 MG capsule   Other Visit Diagnoses     Sebaceous cyst    -  Primary   Hemorrhoid prolapse       Sinus pressure  Hemorrhoid - Additional info provided. It sounds like it is healing well.   Sinus pressure - has f/u with ENT soon. Continue nasal steroid spray.   Return in about 25 weeks (around 10/28/2023).    Nani Gasser, MD

## 2023-05-06 NOTE — Assessment & Plan Note (Signed)
Happy with current regimen of Cymbalta. Hoping she can go back down if she is still doing well at follow-up. Meds refilled.

## 2023-05-06 NOTE — Assessment & Plan Note (Signed)
Back on Ketamine infusions and doing well. Was finally able to get approved with the insurance.

## 2023-05-07 ENCOUNTER — Telehealth: Payer: Self-pay | Admitting: Family Medicine

## 2023-05-07 NOTE — Telephone Encounter (Signed)
Called patient to schedule Medicare Annual Wellness Visit (AWV). Left message for patient to call back and schedule Medicare Annual Wellness Visit (AWV).  Last date of AWV: 2019  Please schedule an appointment at any time with [facility].  If any questions, please contact me at [phone].  Thank you ,  Morgan Jessup Patient Access Advocate II Direct Dial: [phone]   

## 2023-05-16 ENCOUNTER — Ambulatory Visit: Payer: Medicare Other | Admitting: Psychology

## 2023-06-04 ENCOUNTER — Other Ambulatory Visit: Payer: Self-pay | Admitting: Family Medicine

## 2023-06-06 ENCOUNTER — Encounter: Payer: Medicare Other | Admitting: Family Medicine

## 2023-06-07 DIAGNOSIS — Z79899 Other long term (current) drug therapy: Secondary | ICD-10-CM | POA: Diagnosis not present

## 2023-06-07 DIAGNOSIS — Z5181 Encounter for therapeutic drug level monitoring: Secondary | ICD-10-CM | POA: Diagnosis not present

## 2023-06-10 DIAGNOSIS — G501 Atypical facial pain: Secondary | ICD-10-CM | POA: Diagnosis not present

## 2023-06-10 DIAGNOSIS — G894 Chronic pain syndrome: Secondary | ICD-10-CM | POA: Diagnosis not present

## 2023-06-10 DIAGNOSIS — G43009 Migraine without aura, not intractable, without status migrainosus: Secondary | ICD-10-CM | POA: Diagnosis not present

## 2023-06-10 DIAGNOSIS — G500 Trigeminal neuralgia: Secondary | ICD-10-CM | POA: Diagnosis not present

## 2023-06-12 DIAGNOSIS — I878 Other specified disorders of veins: Secondary | ICD-10-CM | POA: Diagnosis not present

## 2023-06-12 DIAGNOSIS — Z87891 Personal history of nicotine dependence: Secondary | ICD-10-CM | POA: Diagnosis not present

## 2023-06-12 DIAGNOSIS — Z452 Encounter for adjustment and management of vascular access device: Secondary | ICD-10-CM | POA: Diagnosis not present

## 2023-06-12 DIAGNOSIS — G500 Trigeminal neuralgia: Secondary | ICD-10-CM | POA: Diagnosis not present

## 2023-06-14 ENCOUNTER — Ambulatory Visit (INDEPENDENT_AMBULATORY_CARE_PROVIDER_SITE_OTHER): Payer: Medicare Other | Admitting: Sports Medicine

## 2023-06-14 ENCOUNTER — Other Ambulatory Visit (INDEPENDENT_AMBULATORY_CARE_PROVIDER_SITE_OTHER): Payer: Medicare Other

## 2023-06-14 DIAGNOSIS — M1811 Unilateral primary osteoarthritis of first carpometacarpal joint, right hand: Secondary | ICD-10-CM

## 2023-06-14 DIAGNOSIS — M180 Bilateral primary osteoarthritis of first carpometacarpal joints: Secondary | ICD-10-CM

## 2023-06-14 NOTE — Progress Notes (Signed)
    Procedures performed today:    Procedure: Real-time Ultrasound Guided injection of the left first [facility] Device: Samsung HS60  Verbal informed consent obtained.  Time-out conducted.  Noted no overlying erythema, induration, or other signs of local infection.  Skin prepped in a sterile fashion.  Local anesthesia: Topical Ethyl chloride.  With sterile technique and under real time ultrasound guidance: Arthritic joint noted, 1/2 cc lidocaine, 1/2 cc kenalog 40 injected easily. Completed without difficulty  Advised to call if fevers/chills, erythema, induration, drainage, or persistent bleeding.  Images permanently stored and available for review in PACS.  Impression: Technically successful ultrasound guided injection.  Independent interpretation of notes and tests performed by another provider:   None.  Brief History, Exam, Impression, and Recommendations:    Primary osteoarthritis of both first carpometacarpal joints This is a pleasant 57 year old female, she has bilateral first CMC osteoarthritis, last injected her right side in March 2024, left side hurting today, we injected her left first [facility] today, return to see me as needed. She will continue her home conditioning.    ____________________________________________ Ihor Austin. Benjamin Stain, M.D., ABFM., CAQSM., AME. Primary Care and Sports Medicine [facility]  Adjunct Professor of Family Medicine  [employer]  Restaurant manager, fast food

## 2023-06-14 NOTE — Assessment & Plan Note (Signed)
This is a pleasant 57 year old female, she has bilateral first CMC osteoarthritis, last injected her right side in March 2024, left side hurting today, we injected her left first [facility] today, return to see me as needed. She will continue her home conditioning.

## 2023-06-19 ENCOUNTER — Ambulatory Visit (INDEPENDENT_AMBULATORY_CARE_PROVIDER_SITE_OTHER): Payer: Medicare Other | Admitting: Psychology

## 2023-06-19 DIAGNOSIS — F411 Generalized anxiety disorder: Secondary | ICD-10-CM | POA: Diagnosis not present

## 2023-06-19 DIAGNOSIS — F331 Major depressive disorder, recurrent, moderate: Secondary | ICD-10-CM

## 2023-06-19 NOTE — Progress Notes (Signed)
[facility] Counselor/Therapist Progress Note  Patient ID: Desiree Wilson, MRN: [id number],    Date: 06/19/2023  Time Spent: 10:00am-10:50am   50 minutes   Treatment Type: Individual Therapy  Reported Symptoms: stress, pain  Mental Status Exam: Appearance:  Casual     Behavior: Appropriate  Motor: Normal  Speech/Language:  Normal Rate  Affect: Appropriate  Mood: normal  Thought process: normal  Thought content:   WNL  Sensory/Perceptual disturbances:   WNL  Orientation: oriented to person, place, time/date, and situation  Attention: Good  Concentration: Good  Memory: WNL  Fund of knowledge:  Good  Insight:   Good  Judgment:  Good  Impulse Control: Good   Risk Assessment: Danger to Self:  No Self-injurious Behavior: No Danger to Others: No Duty to Warn:no Physical Aggression / Violence:No  Access to Firearms a concern: No  Gang Involvement:No   Subjective: Pt present for face-to-face individual therapy via video.  Pt consents to telehealth video session and is aware of limitations of virtual sessions. Location of pt: home Location of therapist: home office.   Pt talked about being in a lot of pain today.   She got her infusion at the end of April and is scared that her pain has gotten so bad again.  Her next infusion is not scheduled yet which makes pt worried and anxious.   Pt has not been getting much sleep.   Pt talked about an issue with a friend who was very mean to her about pt's pain.  Addressed the interaction and helped pt process her feelings and relationship dynamics.  Pt talked about financial stress and her car needing to be replaced.   Pt has also been worried about her parents.  Her mother had to go to the hospital bc of anxiety and high blood pressure.  Pt had to take care of her father while her mother was in the hospital.   Worked on self care strategies. Provided supportive therapy.    Interventions: Cognitive Behavioral Therapy and  Insight-Oriented  Diagnosis:  F33.1 and F41.1  Plan: Plan of Care: Recommend ongoing therapy.   Pt participated with setting treatment goals.   Pt states her goals for therapy are to have a place to process her feelings.   Pt wants to improve coping skills.   Plan to meet every month.    Treatment Plan (Treatment Plan Target Date: 2/29/2025) Client Abilities/Strengths  Pt is bright, engaging, and motivated for therapy.  Client Treatment Preferences  Individual therapy.  Client Statement of Needs  Improve copings skills and have a safe place to talk about feelings.   Symptoms  Depressed or irritable mood. Excessive and/or unrealistic worry that is difficult to control occurring more days than not for at least 6 months about a number of events or activities. Hypervigilance (e.g., feeling constantly on edge, experiencing concentration difficulties, having trouble falling or staying asleep, exhibiting a general state of irritability).  Problems Addressed  Unipolar Depression, Anxiety Goals 1. Alleviate depressive symptoms and return to previous level of effective functioning. 2. Appropriately grieve the loss in order to normalize mood and to return to previously adaptive level of functioning. Objective Learn and implement behavioral strategies to overcome depression. Target Date: 2025-02-29 Frequency: Monthly  Progress: 40 Modality: individual  Related Interventions Assist the client in developing skills that increase the likelihood of deriving pleasure from behavioral activation (e.g., assertiveness skills, developing an exercise plan, less internal/more external focus, increased social involvement); reinforce success. Engage the  client in "behavioral activation," increasing his/her activity level and contact with sources of reward, while identifying processes that inhibit activation. use behavioral techniques such as instruction, rehearsal, role-playing, role reversal, as needed, to  facilitate activity in the client's daily life; reinforce success. 3. Develop healthy interpersonal relationships that lead to the alleviation and help prevent the relapse of depression. 4. Develop healthy thinking patterns and beliefs about self, others, and the world that lead to the alleviation and help prevent the relapse of depression. 5. Enhance ability to effectively cope with the full variety of life's worries and anxieties. 6. Learn and implement coping skills that result in a reduction of anxiety and worry, and improved daily functioning. Objective Learn and implement problem-solving strategies for realistically addressing worries. Target Date: 2025-02-29 Frequency: Monthly  Progress: 40 Modality: individual  Related Interventions Assign the client a homework exercise in which he/she problem-solves a current problem (see Mastery of Your Anxiety and Worry: Workbook by Elenora Fender and Filbert Schilder or Generalized Anxiety Disorder by Elesa Hacker, and Filbert Schilder); review, reinforce success, and provide corrective feedback toward improvement. Teach the client problem-solving strategies involving specifically defining a problem, generating options for addressing it, evaluating the pros and cons of each option, selecting and implementing an optional action, and reevaluating and refining the action. Objective Learn and implement calming skills to reduce overall anxiety and manage anxiety symptoms. Target Date: 2025-02-29 Frequency: Monthly  Progress: 40 Modality: individual  Related Interventions Assign the client to read about progressive muscle relaxation and other calming strategies in relevant books or treatment manuals (e.g., Progressive Relaxation Training by Twana First; Mastery of Your Anxiety and Worry: Workbook by Earlie Counts). Assign the client homework each session in which he/she practices relaxation exercises daily, gradually applying them progressively from  non-anxiety-provoking to anxiety-provoking situations; review and reinforce success while providing corrective feedback toward improvement. Teach the client calming/relaxation skills (e.g., applied relaxation, progressive muscle relaxation, cue controlled relaxation; mindful breathing; biofeedback) and how to discriminate better between relaxation and tension; teach the client how to apply these skills to his/her daily life. 7. Recognize, accept, and cope with feelings of depression. 8. Reduce overall frequency, intensity, and duration of the anxiety so that daily functioning is not impaired. 9. Resolve the core conflict that is the source of anxiety. 10. Stabilize anxiety level while increasing ability to function on a daily basis. Diagnosis Axis none F33.1 Major Depressive Disorder  Axis none F41.1 General Anxiety Disorder  Conditions For Discharge Achievement of treatment goals and objectives   Salomon Fick, LCSW

## 2023-07-02 ENCOUNTER — Other Ambulatory Visit: Payer: Self-pay | Admitting: Family Medicine

## 2023-07-02 ENCOUNTER — Ambulatory Visit (INDEPENDENT_AMBULATORY_CARE_PROVIDER_SITE_OTHER): Payer: Medicare Other | Admitting: Family Medicine

## 2023-07-02 ENCOUNTER — Encounter: Payer: Self-pay | Admitting: Family Medicine

## 2023-07-02 VITALS — BP 187/114 | HR 81 | Resp 18 | Ht 65.0 in | Wt 200.2 lb

## 2023-07-02 DIAGNOSIS — I10 Essential (primary) hypertension: Secondary | ICD-10-CM

## 2023-07-02 MED ORDER — AMLODIPINE BESYLATE 5 MG PO TABS: 5.0000 mg | ORAL_TABLET | Freq: Every day | ORAL | 1 refills | Status: DC

## 2023-07-02 NOTE — Assessment & Plan Note (Signed)
-   pt has history of elevated blood pressure for a few months. At this point with BP being elevated in clinic today and pt noting headaches I will go ahead and start her on amlodipine. Amlodipine has good vasodilation which I am hoping would help the vasoconstriction from her migraines.  -will obtain blood work and Designer, television/film set ratio to assess for macroalbumniuria secondary to uncontrolled htn - pt tearful on exam today because she doesn't want to feel like she has another problem to worry about. Reassured patient that we will do our best to get her through this tough time.

## 2023-07-02 NOTE — Progress Notes (Signed)
Acute Office Visit  Subjective:     Patient ID: Desiree Wilson, female    DOB: 04-08-1966, 58 y.o.   MRN: [id number]  Chief Complaint  Patient presents with   Hypertension   Headache    HPI Patient is in today for elevated blood pressure. Pt has had elevated bp for about a year. She has a hx of chronic trigeminal neuralgia and gets ketamine infusions. BP today in clinic is 184/114. She gets her infusions every 2 1/2-3 months. For the past month, she has been waking up with terrible headaches and notes an increase in migraines. The time in between infusions are her worst time.    Review of Systems  Constitutional:  Negative for chills and fever.  Respiratory:  Negative for cough and shortness of breath.   Cardiovascular:  Negative for chest pain.  Neurological:  Negative for headaches.        Objective:    BP (!) 187/114 (BP Location: Left Arm, Patient Position: Sitting, Cuff Size: Normal)   Pulse 81   Resp 18   Ht 5\' 5"  (1.651 m)   Wt 200 lb 4 oz (90.8 kg)   SpO2 99%   BMI 33.32 kg/m    Physical Exam Vitals and nursing note reviewed.  Constitutional:      General: She is not in acute distress.    Appearance: Normal appearance.  HENT:     Head: Normocephalic and atraumatic.     Right Ear: External ear normal.     Left Ear: External ear normal.     Nose: Nose normal.  Eyes:     Conjunctiva/sclera: Conjunctivae normal.  Cardiovascular:     Rate and Rhythm: Normal rate and regular rhythm.  Pulmonary:     Effort: Pulmonary effort is normal.     Breath sounds: Normal breath sounds.  Neurological:     General: No focal deficit present.     Mental Status: She is alert and oriented to person, place, and time.  Psychiatric:        Mood and Affect: Mood normal.        Behavior: Behavior normal.        Thought Content: Thought content normal.        Judgment: Judgment normal.     No results found for any visits on 07/02/23.      Assessment & Plan:    Problem List Items Addressed This Visit       Cardiovascular and Mediastinum   Primary hypertension - Primary    - pt has history of elevated blood pressure for a few months. At this point with BP being elevated in clinic today and pt noting headaches I will go ahead and start her on amlodipine. Amlodipine has good vasodilation which I am hoping would help the vasoconstriction from her migraines.  -will obtain blood work and Designer, television/film set ratio to assess for macroalbumniuria secondary to uncontrolled htn - pt tearful on exam today because she doesn't want to feel like she has another problem to worry about. Reassured patient that we will do our best to get her through this tough time.       Relevant Medications   amLODipine (NORVASC) 5 MG tablet   heparin lock flush 100 UNIT/ML SOLN injection   Other Relevant Orders   Microalbumin / creatinine urine ratio   BASIC METABOLIC PANEL WITH GFR    Meds ordered this encounter  Medications   amLODipine (NORVASC) 5 MG tablet  Sig: Take 1 tablet (5 mg total) by mouth daily.    Dispense:  30 tablet    Refill:  1    Return in about 2 weeks (around 07/16/2023) for nurse visit bp check.  Charlton Amor, DO

## 2023-07-03 LAB — BASIC METABOLIC PANEL WITH GFR
BUN: 11 mg/dL (ref 7–25)
CO2: 27 mmol/L (ref 20–32)
Calcium: 9.4 mg/dL (ref 8.6–10.4)
Chloride: 92 mmol/L — ABNORMAL LOW (ref 98–110)
Creat: 0.97 mg/dL (ref 0.50–1.03)
Glucose, Bld: 101 mg/dL — ABNORMAL HIGH (ref 65–99)
Potassium: 4.6 mmol/L (ref 3.5–5.3)
Sodium: 129 mmol/L — ABNORMAL LOW (ref 135–146)
eGFR: 69 mL/min/{1.73_m2} (ref >=60)

## 2023-07-03 LAB — MICROALBUMIN / CREATININE URINE RATIO
Creatinine, Urine: 46 mg/dL (ref 20–275)
Microalb Creat Ratio: 43 mg/g creat — ABNORMAL HIGH (ref <30)
Microalb, Ur: 2 mg/dL

## 2023-07-16 ENCOUNTER — Ambulatory Visit: Payer: Medicare Other

## 2023-07-17 DIAGNOSIS — G501 Atypical facial pain: Secondary | ICD-10-CM | POA: Diagnosis not present

## 2023-07-17 DIAGNOSIS — G500 Trigeminal neuralgia: Secondary | ICD-10-CM | POA: Diagnosis not present

## 2023-07-17 DIAGNOSIS — G894 Chronic pain syndrome: Secondary | ICD-10-CM | POA: Diagnosis not present

## 2023-07-17 DIAGNOSIS — Z5181 Encounter for therapeutic drug level monitoring: Secondary | ICD-10-CM | POA: Diagnosis not present

## 2023-07-17 DIAGNOSIS — Z79899 Other long term (current) drug therapy: Secondary | ICD-10-CM | POA: Diagnosis not present

## 2023-07-19 ENCOUNTER — Ambulatory Visit: Payer: Medicare Other

## 2023-07-24 ENCOUNTER — Ambulatory Visit (INDEPENDENT_AMBULATORY_CARE_PROVIDER_SITE_OTHER): Payer: Medicare Other | Admitting: Family Medicine

## 2023-07-24 VITALS — BP 131/86 | HR 95

## 2023-07-24 DIAGNOSIS — N1831 Chronic kidney disease, stage 3a: Secondary | ICD-10-CM

## 2023-07-24 DIAGNOSIS — I10 Essential (primary) hypertension: Secondary | ICD-10-CM | POA: Diagnosis not present

## 2023-07-24 MED ORDER — AMLODIPINE BESYLATE 10 MG PO TABS
10.0000 mg | ORAL_TABLET | Freq: Every day | ORAL | 1 refills | Status: DC
Start: 2023-07-24 — End: 2023-08-15

## 2023-07-24 NOTE — Progress Notes (Signed)
Inc amlodipine.    Meds ordered this encounter  Medications   amLODipine (NORVASC) 10 MG tablet    Sig: Take 1 tablet (10 mg total) by mouth daily.    Dispense:  30 tablet    Refill:  1   Almost at goal.   F/U in 2-3 weeks.

## 2023-07-24 NOTE — Progress Notes (Signed)
   Established Patient Office Visit  Subjective   Patient ID: Desiree Wilson, female    DOB: 15-May-1966  Age: 56 y.o. MRN: [id number]  Chief Complaint  Patient presents with   Hypertension    HPI  Desiree Wilson is here for blood pressure check. Denies chest pain, shortness of breath or medication problems.   ROS    Objective:     BP 131/86   Pulse 95   SpO2 98%    Physical Exam   No results found for any visits on 07/24/23.    The 10-year ASCVD risk score (Arnett DK, et al., 2019) is: 5.1%    Assessment & Plan:  BP check - Blood pressure still slightly elevated. Per Dr Linford Arnold patient advised to increase to amlodipine 10 mg once daily. Follow up in 2 weeks for nurse visit blood pressure check.   Advised patient to schedule a Medicare Wellness visit.   Problem List Items Addressed This Visit       Unprioritized   Primary hypertension - Primary    Return in about 2 weeks (around 08/07/2023) for nurse visit BP check. Earna Coder, Janalyn Harder, CMA

## 2023-07-25 ENCOUNTER — Ambulatory Visit: Payer: Medicare Other | Admitting: Psychology

## 2023-08-08 ENCOUNTER — Ambulatory Visit: Payer: Medicare Other

## 2023-08-15 ENCOUNTER — Telehealth (INDEPENDENT_AMBULATORY_CARE_PROVIDER_SITE_OTHER): Payer: Medicare Other | Admitting: Family Medicine

## 2023-08-15 DIAGNOSIS — G500 Trigeminal neuralgia: Secondary | ICD-10-CM | POA: Diagnosis not present

## 2023-08-15 DIAGNOSIS — I10 Essential (primary) hypertension: Secondary | ICD-10-CM | POA: Diagnosis not present

## 2023-08-15 DIAGNOSIS — R52 Pain, unspecified: Secondary | ICD-10-CM | POA: Diagnosis not present

## 2023-08-15 MED ORDER — PREGABALIN 50 MG PO CAPS: ORAL_CAPSULE | ORAL | 0 refills | Status: DC

## 2023-08-15 MED ORDER — PREDNISONE 20 MG PO TABS: ORAL_TABLET | ORAL | 0 refills | Status: AC

## 2023-08-15 MED ORDER — AMLODIPINE BESYLATE 10 MG PO TABS: 10.0000 mg | ORAL_TABLET | Freq: Every day | ORAL | 1 refills | Status: DC

## 2023-08-15 NOTE — Progress Notes (Signed)
Virtual Visit via Video Note  I connected with Desiree Wilson on 08/15/23 at  1:00 PM EDT by a video enabled telemedicine application and verified that I am speaking with the correct person using two identifiers.   I discussed the limitations of evaluation and management by telemedicine and the availability of in person appointments. The patient expressed understanding and agreed to proceed.  Patient location: at home Provider location: in office  Acute Office Visit  Subjective:     Patient ID: Desiree Wilson, female    DOB: March 29, 1966, 57 y.o.   MRN: [id number]  No chief complaint on file.   HPI Patient is in today for flaring with fibromyalgia.  She is on 90 mg of Cymbalta. She had her port placed this summer and had her Ketamine infusion last month. Supposed to be Q3 mo.  But didn't really help. Was told by pain mgt to double up on her muscle relaxer. Has called them several times bc her pain is not well controlled.   She is vomiting from the pain.  She does have some Phenergan which she is using as needed.  She just feels really disappointed that she did not get a lot of relief like she has in the past.  They did say they would be able to move up the next ketamine injection to September instead of August.  She just does not know what to do because her pain is quite severe she knows if she goes to the ED they will just send her back home after giving her pain meds and then it will come right back within a few hours.  She needs something that can help provide relief over the next several weeks while she is waiting for that next injection.  ROS      Objective:    There were no vitals taken for this visit.   Physical Exam Neurological:     Mental Status: She is alert.  Psychiatric:     Comments: tearful     No results found for any visits on 08/15/23.      Assessment & Plan:   Problem List Items Addressed This Visit       Cardiovascular and Mediastinum    Primary hypertension   Relevant Medications   amLODipine (NORVASC) 10 MG tablet     Nervous and Auditory   Trigeminal neuralgia - Primary   Relevant Medications   pregabalin (LYRICA) 50 MG capsule   Other Visit Diagnoses     Uncontrolled pain          Gust some options to get her pain under better control.  Working to try to do a prednisone taper to see if that helps with inflammation working to start pregabalin she has taken gabapentin in the past and did well with it in regards to side effects but says it was not super helpful so eventually quit taking it.  But I think doing the Lyrica would at least be an option that we could try.  Will also increase Cymbalta 220 mg total daily recommend 60 mg twice a day I like for her to try it at least for about 10 days to see if that is helpful if it is I can always adjust the prescription.  Meds ordered this encounter  Medications   predniSONE (DELTASONE) 20 MG tablet    Sig: Take 2 tablets (40 mg total) by mouth daily with breakfast for 4 days, THEN 1 tablet (20 mg total)  daily with breakfast for 4 days, THEN 0.5 tablets (10 mg total) daily with breakfast for 4 days.    Dispense:  10 tablet    Refill:  0   pregabalin (LYRICA) 50 MG capsule    Sig: Take 1 capsule (50 mg total) by mouth 2 (two) times daily for 6 days, THEN 1 capsule (50 mg total) 3 (three) times daily for 6 days.    Dispense:  90 capsule    Refill:  0   amLODipine (NORVASC) 10 MG tablet    Sig: Take 1 tablet (10 mg total) by mouth daily.    Dispense:  90 tablet    Refill:  1    No follow-ups on file.  I spent 20 minutes on the day of the encounter to include pre-visit record review, face-to-face time with the patient and post visit ordering of test.   Nani Gasser, MD

## 2023-08-15 NOTE — Progress Notes (Signed)
Pt reports that she got a port in June that didn't take she stated that the same day she woke up during the night in pain. She called the pain clinic the next day and spoke with the PA and was instructed to double up on her muscle relaxer and continue taking her other pain medications as directed until she gets relief. Pt states that she has not been able to manage her pain and hasn't been able to get in touch with her pain doctor since. She stated that the last time she tried reaching out was towards the end of last week and still hasn't heard back from anyone.   She has been nauseated, dry heaving and continuous pain. She wanted to know if Dr. Linford Arnold could possible could get her into a hospital setting to manage her pain.

## 2023-08-16 ENCOUNTER — Encounter (HOSPITAL_BASED_OUTPATIENT_CLINIC_OR_DEPARTMENT_OTHER): Payer: Self-pay

## 2023-08-16 ENCOUNTER — Emergency Department (HOSPITAL_BASED_OUTPATIENT_CLINIC_OR_DEPARTMENT_OTHER)
Admission: EM | Admit: 2023-08-16 | Discharge: 2023-08-16 | Disposition: A | Discharge status: Home / Self Care | Payer: Medicare Other | Attending: Emergency Medicine | Admitting: Emergency Medicine

## 2023-08-16 ENCOUNTER — Other Ambulatory Visit: Payer: Self-pay

## 2023-08-16 DIAGNOSIS — G501 Atypical facial pain: Secondary | ICD-10-CM | POA: Insufficient documentation

## 2023-08-16 DIAGNOSIS — R519 Headache, unspecified: Secondary | ICD-10-CM

## 2023-08-16 MED ORDER — HYDROMORPHONE HCL 1 MG/ML IJ SOLN: 0.5000 mg | Freq: Once | INTRAMUSCULAR | Status: DC

## 2023-08-16 MED ORDER — HYDROMORPHONE HCL 1 MG/ML IJ SOLN
0.5000 mg | Freq: Once | INTRAMUSCULAR | Status: AC
  Administered 2023-08-16: 0.5 mg via INTRAMUSCULAR
  Filled 2023-08-16: qty 1

## 2023-08-16 NOTE — ED Notes (Signed)
Pt. Up to restroom with no difficulty.  Pt. Husband at bedside.

## 2023-08-16 NOTE — Discharge Instructions (Addendum)
It was a pleasure taking care of you today!  It is important that you call your pain management specialist to set up a follow-up appointment regarding today's ED visit.  Continue to take your medications as prescribed.  Return to Emergency Department if experiencing increasing/worsening symptoms.

## 2023-08-16 NOTE — ED Triage Notes (Signed)
The patient stated she take ketamine infusions every three months for pain. She stated she got a port one month ago and had her infusion. She stated the medication has not helped with the pain since last month. She stated she is in increased pain that moves into her neck.

## 2023-08-16 NOTE — ED Provider Notes (Signed)
[facility] EMERGENCY DEPARTMENT AT [facility] [place] Provider Note   CSN: [id number] Arrival date & time: 08/16/23  1424     History  Chief Complaint  Patient presents with  . Facial Pain    Desiree Wilson is a 57 y.o. female with a PMHx of fibromyalgia, trigeminal neuralgia, *** who presents to the ED with concerns with ***. Notes that she received ketamine injections with her last one being July.  Notes that she is in pain management and they have been utilizing ketamine injections.  She had a port placed in June prior to her ketamine injection in July.  She got a pain management specialist and is able to move up her next ketamine infusion to July.  She was to get these every 3 months.  She notes that she has been in pain since her last infusion in July.  Notes when the pain gets so bad she has nausea and vomiting.  She saw her primary care provider yesterday via telehealth and set up a follow-up appointment for next week health visit and they started her on prednisone, pregabalin, and increase her Cymbalta dose.  They recommended seeing her in the office in 1 week.  Patient notes that she was instructed by her provider at the pain management clinic to double up on her muscle relaxant to continue take her other back pain medications as prescribed.  PDMP review, patient was sent 15 tablets of hydrocodone/acetaminophen on 07/25/2023.  The history is provided by the patient. No language interpreter was used.       Home Medications Prior to Admission medications   Medication Sig Start Date End Date Taking? Authorizing Provider  AMBULATORY NON FORMULARY MEDICATION Ketamine nose spray: use 2 sprays prn pain, up to 10 sprays a day    [provider]  AMBULATORY NON FORMULARY MEDICATION Medication Name: Align Probiotic 09/26/22   Agapito Games, MD  amLODipine (NORVASC) 10 MG tablet Take 1 tablet (10 mg total) by mouth daily. 08/15/23   Agapito Games, MD   Carboxymethylcellulose Sodium 1 % GEL Apply to eye as needed.    [provider]  clonazePAM (KLONOPIN) 1 MG tablet Take 1 tablet (1 mg total) by mouth 2 (two) times daily. 06/11/11   Bowen, Scot Jun, DO  colestipol (COLESTID) 1 g tablet Take 1 tablet (1 g total) by mouth 2 (two) times daily. 03/15/23   Armbruster, Willaim Rayas, MD  DULoxetine (CYMBALTA) 30 MG capsule TAKE 1 CAPSULE(30 MG) BY MOUTH IN THE MORNING 05/06/23   Agapito Games, MD  DULoxetine (CYMBALTA) 60 MG capsule Take 1 capsule (60 mg total) by mouth at bedtime. 05/06/23   Agapito Games, MD  estradiol (ESTRACE) 0.1 MG/GM vaginal cream Place 1 Applicatorful vaginally at bedtime. X 7 days, then 3 x a week 02/14/22   Agapito Games, MD  fluticasone [facility]) 50 MCG/ACT nasal spray SHAKE LIQUID AND USE 2 SPRAYS IN [facility] NOSTRIL DAILY 04/17/23   Agapito Games, MD  folic acid (FOLVITE) 1 MG tablet Take 1 mg by mouth daily.    [provider]  frovatriptan (FROVA) 2.5 MG tablet Take 2.5 mg by mouth daily as needed.    [provider]  HYDROcodone-acetaminophen (NORCO/VICODIN) 5-325 MG tablet Take by mouth. 07/25/23 08/24/23  [provider]  ketamine (KETALAR) 10 MG/ML injection Inject into the vein every 3 (three) months.     [provider]  Misc. Devices MISC Dispense central line dressing kit includes  One pair sterile gloves One ChloraPrep  One-Step applicator, 3mL One isopropyl alcohol swabstick One 2" x 2" gauze dressing One 4" x 4" gauze dressing One roll medical tape 06/20/23   [provider]  Multiple Vitamin (MULTIVITAMIN) capsule Take 1 capsule by mouth daily.    [provider]  Needle, Disp, (HUBER NEEDLE 22GX1") 22G X 1" MISC by Does not apply route. 06/20/23 09/18/23  [provider]  omeprazole (PRILOSEC) 40 MG capsule TAKE 1 CAPSULE BY MOUTH DAILY 06/04/23   Agapito Games, MD  Oxcarbazepine (TRILEPTAL) 300 MG tablet Take 300 mg by  mouth 2 (two) times daily. 02/15/21   [provider]  predniSONE (DELTASONE) 20 MG tablet Take 2 tablets (40 mg total) by mouth daily with breakfast for 4 days, THEN 1 tablet (20 mg total) daily with breakfast for 4 days, THEN 0.5 tablets (10 mg total) daily with breakfast for 4 days. 08/15/23 08/27/23  Agapito Games, MD  pregabalin (LYRICA) 50 MG capsule Take 1 capsule (50 mg total) by mouth 2 (two) times daily for 6 days, THEN 1 capsule (50 mg total) 3 (three) times daily for 6 days. 08/15/23 08/27/23  Agapito Games, MD  Probiotic Product (PROBIOTIC PO) Take by mouth.    [provider]  promethazine (PHENERGAN) 25 MG tablet Take 1 tablet (25 mg total) by mouth every 8 (eight) hours as needed for nausea or vomiting. 05/06/19   Agapito Games, MD  rosuvastatin (CRESTOR) 20 MG tablet Take 1 tablet (20 mg total) by mouth daily. 02/08/23   Agapito Games, MD  tiZANidine (ZANAFLEX) 4 MG tablet Take 4 mg by mouth every 6 (six) hours as needed. 01/28/15   [provider]  traZODone (DESYREL) 100 MG tablet TAKE 2 TABLETS(200 MG) BY MOUTH AT BEDTIME AS NEEDED FOR SLEEP 02/19/23   Agapito Games, MD  vitamin B-12 (CYANOCOBALAMIN) 1000 MCG tablet Take 1,000 mcg by mouth daily.    [provider]  vitamin C (ASCORBIC ACID) 500 MG tablet Take 500 mg by mouth daily.    [provider]      Allergies    Azithromycin, Methadone, Tapentadol, Phenobarbital, and Wellbutrin [bupropion]    Review of Systems   Review of Systems  All other systems reviewed and are negative.   Physical Exam Updated Vital Signs BP (!) 140/95 (BP Location: Left Arm)   Pulse 97   Temp 98 F (36.7 C)   Resp 20   Ht 5\' 5"  (1.651 m)   Wt 90 kg   SpO2 96%   BMI 33.02 kg/m  Physical Exam Vitals and nursing note reviewed.  Constitutional:      General: She is not in acute distress.    Appearance: Normal appearance.  HENT:     Mouth/Throat:     Comments:  Uvula midline without swelling. No posterior pharyngeal erythema or tonsillar exudate noted. Patent airway. Pt able to speak in clear complete sentences. Tolerating oral secretions. Eyes:     General: No scleral icterus.    Extraocular Movements: Extraocular movements intact.  Cardiovascular:     Rate and Rhythm: Normal rate and regular rhythm.     Pulses: Normal pulses.     Heart sounds: Normal heart sounds.  Pulmonary:     Effort: Pulmonary effort is normal. No respiratory distress.  Abdominal:     Palpations: Abdomen is soft. There is no mass.     Tenderness: There is no abdominal tenderness.  Musculoskeletal:  General: Normal range of motion.     Cervical back: Neck supple.  Skin:    General: Skin is warm and dry.     Findings: No rash.  Neurological:     Mental Status: She is alert.     Sensory: Sensation is intact.     Motor: Motor function is intact.  Psychiatric:        Behavior: Behavior normal.     ED Results / Procedures / Treatments   Labs (all labs ordered are listed, but only abnormal results are displayed) Labs Reviewed - No data to display  EKG None  Radiology No results found.  Procedures Procedures  {Document cardiac monitor, telemetry assessment procedure when appropriate:1}  Medications Ordered in ED Medications - No data to display  ED Course/ Medical Decision Making/ A&P Clinical Course as of 08/16/23 1741  Fri Aug 16, 2023  1735 6 months of chronic facial pain.  Has been evaluated extensively follows with pain control for her fibromyalgia and TMJ and trigeminal neuralgia gets ketamine infusions.  Vital signs stable today.  Breakthrough pain medication per PA will plan for outpatient follow-up with specialist. [CC]    Clinical Course User Index [CC] Glyn Ade, MD   {   Click here for ABCD2, HEART and other calculatorsREFRESH Note before signing :1}                              Medical Decision Making  ***  {Document  critical care time when appropriate:1} {Document review of labs and clinical decision tools ie heart score, Chads2Vasc2 etc:1}  {Document your independent review of radiology images, and any outside records:1} {Document your discussion with family members, caretakers, and with consultants:1} {Document social determinants of health affecting pt's care:1} {Document your decision making why or why not admission, treatments were needed:1} Final Clinical Impression(s) / ED Diagnoses Final diagnoses:  None    Rx / DC Orders ED Discharge Orders     None

## 2023-08-19 DIAGNOSIS — G501 Atypical facial pain: Secondary | ICD-10-CM | POA: Diagnosis not present

## 2023-08-19 DIAGNOSIS — G500 Trigeminal neuralgia: Secondary | ICD-10-CM | POA: Diagnosis not present

## 2023-08-19 DIAGNOSIS — Z95828 Presence of other vascular implants and grafts: Secondary | ICD-10-CM | POA: Diagnosis not present

## 2023-08-19 DIAGNOSIS — Z5181 Encounter for therapeutic drug level monitoring: Secondary | ICD-10-CM | POA: Diagnosis not present

## 2023-08-19 DIAGNOSIS — Z79899 Other long term (current) drug therapy: Secondary | ICD-10-CM | POA: Diagnosis not present

## 2023-08-21 ENCOUNTER — Encounter: Payer: Self-pay | Admitting: Family Medicine

## 2023-08-30 ENCOUNTER — Ambulatory Visit (INDEPENDENT_AMBULATORY_CARE_PROVIDER_SITE_OTHER): Payer: Medicare Other | Admitting: Psychology

## 2023-08-30 DIAGNOSIS — F331 Major depressive disorder, recurrent, moderate: Secondary | ICD-10-CM | POA: Diagnosis not present

## 2023-08-30 DIAGNOSIS — F411 Generalized anxiety disorder: Secondary | ICD-10-CM | POA: Diagnosis not present

## 2023-08-30 NOTE — Progress Notes (Signed)
[facility] Counselor/Therapist Progress Note  Patient ID: Desiree Wilson, MRN: [id number],    Date: 08/30/2023  Time Spent: 9:00am-9:50am   50 minutes   Treatment Type: Individual Therapy  Reported Symptoms: stress, pain  Mental Status Exam: Appearance:  Casual     Behavior: Appropriate  Motor: Normal  Speech/Language:  Normal Rate  Affect: Appropriate  Mood: normal  Thought process: normal  Thought content:   WNL  Sensory/Perceptual disturbances:   WNL  Orientation: oriented to person, place, time/date, and situation  Attention: Good  Concentration: Good  Memory: WNL  Fund of knowledge:  Good  Insight:   Good  Judgment:  Good  Impulse Control: Good   Risk Assessment: Danger to Self:  No Self-injurious Behavior: No Danger to Others: No Duty to Warn:no Physical Aggression / Violence:No  Access to Firearms a concern: No  Gang Involvement:No   Subjective: Pt present for face-to-face individual therapy via video.  Pt consents to telehealth video session and is aware of limitations and benefits of virtual sessions. Location of pt: home Location of therapist: home office.     Pt talked about having a very difficult couple of months with pain.   Things got so bad that she ended up in the ER.   Pt had to advocate for herself with the pain clinic and a couple of weeks ago they finally responded and are trying to help her.  She is scheduled for monthly infusions beginning in September. Pt talked about feeling badly that she has not been able to help her parents or husband [name] bc her pain has been so debilitating.  Pt has had conversations with Mark bc the pain was affecting their relationship.   Addressed the issues and helped pt process her feelings and relationship dynamics. Worked on self care strategies. Provided supportive therapy.    Interventions: Cognitive Behavioral Therapy and Insight-Oriented  Diagnosis:  F33.1 and F41.1  Plan: Plan of Care:  Recommend ongoing therapy.   Pt participated with setting treatment goals.   Pt states her goals for therapy are to have a place to process her feelings.   Pt wants to improve coping skills.   Plan to meet every month.    Treatment Plan (Treatment Plan Target Date: 2/29/2025) Client Abilities/Strengths  Pt is bright, engaging, and motivated for therapy.  Client Treatment Preferences  Individual therapy.  Client Statement of Needs  Improve copings skills and have a safe place to talk about feelings.   Symptoms  Depressed or irritable mood. Excessive and/or unrealistic worry that is difficult to control occurring more days than not for at least 6 months about a number of events or activities. Hypervigilance (e.g., feeling constantly on edge, experiencing concentration difficulties, having trouble falling or staying asleep, exhibiting a general state of irritability).  Problems Addressed  Unipolar Depression, Anxiety Goals 1. Alleviate depressive symptoms and return to previous level of effective functioning. 2. Appropriately grieve the loss in order to normalize mood and to return to previously adaptive level of functioning. Objective Learn and implement behavioral strategies to overcome depression. Target Date: 2025-02-29 Frequency: Monthly  Progress: 40 Modality: individual  Related Interventions Assist the client in developing skills that increase the likelihood of deriving pleasure from behavioral activation (e.g., assertiveness skills, developing an exercise plan, less internal/more external focus, increased social involvement); reinforce success. Engage the client in "behavioral activation," increasing his/her activity level and contact with sources of reward, while identifying processes that inhibit activation. use behavioral techniques such as  instruction, rehearsal, role-playing, role reversal, as needed, to facilitate activity in the client's daily life; reinforce success. 3. Develop  healthy interpersonal relationships that lead to the alleviation and help prevent the relapse of depression. 4. Develop healthy thinking patterns and beliefs about self, others, and the world that lead to the alleviation and help prevent the relapse of depression. 5. Enhance ability to effectively cope with the full variety of life's worries and anxieties. 6. Learn and implement coping skills that result in a reduction of anxiety and worry, and improved daily functioning. Objective Learn and implement problem-solving strategies for realistically addressing worries. Target Date: 2025-02-29 Frequency: Monthly  Progress: 40 Modality: individual  Related Interventions Assign the client a homework exercise in which he/she problem-solves a current problem (see Mastery of Your Anxiety and Worry: Workbook by Elenora Fender and Filbert Schilder or Generalized Anxiety Disorder by Elesa Hacker, and Filbert Schilder); review, reinforce success, and provide corrective feedback toward improvement. Teach the client problem-solving strategies involving specifically defining a problem, generating options for addressing it, evaluating the pros and cons of each option, selecting and implementing an optional action, and reevaluating and refining the action. Objective Learn and implement calming skills to reduce overall anxiety and manage anxiety symptoms. Target Date: 2025-02-29 Frequency: Monthly  Progress: 40 Modality: individual  Related Interventions Assign the client to read about progressive muscle relaxation and other calming strategies in relevant books or treatment manuals (e.g., Progressive Relaxation Training by Twana First; Mastery of Your Anxiety and Worry: Workbook by Earlie Counts). Assign the client homework each session in which he/she practices relaxation exercises daily, gradually applying them progressively from non-anxiety-provoking to anxiety-provoking situations; review and reinforce success while providing  corrective feedback toward improvement. Teach the client calming/relaxation skills (e.g., applied relaxation, progressive muscle relaxation, cue controlled relaxation; mindful breathing; biofeedback) and how to discriminate better between relaxation and tension; teach the client how to apply these skills to his/her daily life. 7. Recognize, accept, and cope with feelings of depression. 8. Reduce overall frequency, intensity, and duration of the anxiety so that daily functioning is not impaired. 9. Resolve the core conflict that is the source of anxiety. 10. Stabilize anxiety level while increasing ability to function on a daily basis. Diagnosis Axis none F33.1 Major Depressive Disorder  Axis none F41.1 General Anxiety Disorder  Conditions For Discharge Achievement of treatment goals and objectives   Salomon Fick, LCSW

## 2023-09-03 DIAGNOSIS — G500 Trigeminal neuralgia: Secondary | ICD-10-CM | POA: Diagnosis not present

## 2023-09-03 DIAGNOSIS — Z95828 Presence of other vascular implants and grafts: Secondary | ICD-10-CM | POA: Diagnosis not present

## 2023-09-03 DIAGNOSIS — G501 Atypical facial pain: Secondary | ICD-10-CM | POA: Diagnosis not present

## 2023-09-03 DIAGNOSIS — G894 Chronic pain syndrome: Secondary | ICD-10-CM | POA: Diagnosis not present

## 2023-09-09 ENCOUNTER — Encounter: Payer: Self-pay | Admitting: Family Medicine

## 2023-09-09 DIAGNOSIS — F325 Major depressive disorder, single episode, in full remission: Secondary | ICD-10-CM

## 2023-09-10 DIAGNOSIS — H16011 Central corneal ulcer, right eye: Secondary | ICD-10-CM | POA: Diagnosis not present

## 2023-09-10 MED ORDER — DULOXETINE HCL 60 MG PO CPEP
60.0000 mg | ORAL_CAPSULE | Freq: Two times a day (BID) | ORAL | 1 refills | Status: DC
Start: 2023-09-10 — End: 2024-04-27

## 2023-09-10 NOTE — Telephone Encounter (Signed)
Meds ordered this encounter  Medications   DULoxetine (CYMBALTA) 60 MG capsule    Sig: Take 1 capsule (60 mg total) by mouth 2 (two) times daily.    Dispense:  180 capsule    Refill:  1

## 2023-09-11 DIAGNOSIS — Z5181 Encounter for therapeutic drug level monitoring: Secondary | ICD-10-CM | POA: Diagnosis not present

## 2023-09-11 DIAGNOSIS — G501 Atypical facial pain: Secondary | ICD-10-CM | POA: Diagnosis not present

## 2023-09-11 DIAGNOSIS — Z79899 Other long term (current) drug therapy: Secondary | ICD-10-CM | POA: Diagnosis not present

## 2023-09-12 DIAGNOSIS — H16041 Marginal corneal ulcer, right eye: Secondary | ICD-10-CM | POA: Diagnosis not present

## 2023-09-17 ENCOUNTER — Ambulatory Visit (INDEPENDENT_AMBULATORY_CARE_PROVIDER_SITE_OTHER): Payer: Medicare Other | Admitting: Psychology

## 2023-09-17 DIAGNOSIS — F331 Major depressive disorder, recurrent, moderate: Secondary | ICD-10-CM

## 2023-09-17 DIAGNOSIS — F411 Generalized anxiety disorder: Secondary | ICD-10-CM

## 2023-09-17 DIAGNOSIS — H16011 Central corneal ulcer, right eye: Secondary | ICD-10-CM | POA: Diagnosis not present

## 2023-09-17 NOTE — Progress Notes (Signed)
[facility] Counselor/Therapist Progress Note  Patient ID: Desiree Wilson, MRN: [id number],    Date: 09/17/2023  Time Spent: 9:00am-9:45am   45 minutes   Treatment Type: Individual Therapy  Reported Symptoms: stress, pain  Mental Status Exam: Appearance:  Casual     Behavior: Appropriate  Motor: Normal  Speech/Language:  Normal Rate  Affect: Appropriate  Mood: normal  Thought process: normal  Thought content:   WNL  Sensory/Perceptual disturbances:   WNL  Orientation: oriented to person, place, time/date, and situation  Attention: Good  Concentration: Good  Memory: WNL  Fund of knowledge:  Good  Insight:   Good  Judgment:  Good  Impulse Control: Good   Risk Assessment: Danger to Self:  No Self-injurious Behavior: No Danger to Others: No Duty to Warn:no Physical Aggression / Violence:No  Access to Firearms a concern: No  Gang Involvement:No   Subjective: Pt present for face-to-face individual therapy via video.  Pt consents to telehealth video session and is aware of limitations and benefits of virtual sessions. Location of pt: home Location of therapist: home office.   Pt talked about her health.  She has an eye infection that she has needed to get treatment for.   She has had infusion for pain and increased her pain patch which has helped make pt's pain more manageable.   Pt talked about her relationship with [name].   He has been unhappy with his life.  He hates his job and has been drinking more and has anxiety.  He has withdrawn from pt and said some hurtful things.   Helped pt process her feelings and relationship dynamics.   Addressed how she can effectively communicate her needs to [facility].  Worked on self care strategies. Provided supportive therapy.    Interventions: Cognitive Behavioral Therapy and Insight-Oriented  Diagnosis:  F33.1 and F41.1  Plan: Plan of Care: Recommend ongoing therapy.   Pt participated with setting treatment  goals.   Pt states her goals for therapy are to have a place to process her feelings.   Pt wants to improve coping skills.   Plan to meet every month.    Treatment Plan (Treatment Plan Target Date: 2/29/2025) Client Abilities/Strengths  Pt is bright, engaging, and motivated for therapy.  Client Treatment Preferences  Individual therapy.  Client Statement of Needs  Improve copings skills and have a safe place to talk about feelings.   Symptoms  Depressed or irritable mood. Excessive and/or unrealistic worry that is difficult to control occurring more days than not for at least 6 months about a number of events or activities. Hypervigilance (e.g., feeling constantly on edge, experiencing concentration difficulties, having trouble falling or staying asleep, exhibiting a general state of irritability).  Problems Addressed  Unipolar Depression, Anxiety Goals 1. Alleviate depressive symptoms and return to previous level of effective functioning. 2. Appropriately grieve the loss in order to normalize mood and to return to previously adaptive level of functioning. Objective Learn and implement behavioral strategies to overcome depression. Target Date: 2025-02-29 Frequency: Monthly  Progress: 40 Modality: individual  Related Interventions Assist the client in developing skills that increase the likelihood of deriving pleasure from behavioral activation (e.g., assertiveness skills, developing an exercise plan, less internal/more external focus, increased social involvement); reinforce success. Engage the client in "behavioral activation," increasing his/her activity level and contact with sources of reward, while identifying processes that inhibit activation. use behavioral techniques such as instruction, rehearsal, role-playing, role reversal, as needed, to facilitate activity  in the client's daily life; reinforce success. 3. Develop healthy interpersonal relationships that lead to the alleviation and  help prevent the relapse of depression. 4. Develop healthy thinking patterns and beliefs about self, others, and the world that lead to the alleviation and help prevent the relapse of depression. 5. Enhance ability to effectively cope with the full variety of life's worries and anxieties. 6. Learn and implement coping skills that result in a reduction of anxiety and worry, and improved daily functioning. Objective Learn and implement problem-solving strategies for realistically addressing worries. Target Date: 2025-02-29 Frequency: Monthly  Progress: 40 Modality: individual  Related Interventions Assign the client a homework exercise in which he/she problem-solves a current problem (see Mastery of Your Anxiety and Worry: Workbook by Elenora Fender and Filbert Schilder or Generalized Anxiety Disorder by Elesa Hacker, and Filbert Schilder); review, reinforce success, and provide corrective feedback toward improvement. Teach the client problem-solving strategies involving specifically defining a problem, generating options for addressing it, evaluating the pros and cons of each option, selecting and implementing an optional action, and reevaluating and refining the action. Objective Learn and implement calming skills to reduce overall anxiety and manage anxiety symptoms. Target Date: 2025-02-29 Frequency: Monthly  Progress: 40 Modality: individual  Related Interventions Assign the client to read about progressive muscle relaxation and other calming strategies in relevant books or treatment manuals (e.g., Progressive Relaxation Training by Twana First; Mastery of Your Anxiety and Worry: Workbook by Earlie Counts). Assign the client homework each session in which he/she practices relaxation exercises daily, gradually applying them progressively from non-anxiety-provoking to anxiety-provoking situations; review and reinforce success while providing corrective feedback toward improvement. Teach the client  calming/relaxation skills (e.g., applied relaxation, progressive muscle relaxation, cue controlled relaxation; mindful breathing; biofeedback) and how to discriminate better between relaxation and tension; teach the client how to apply these skills to his/her daily life. 7. Recognize, accept, and cope with feelings of depression. 8. Reduce overall frequency, intensity, and duration of the anxiety so that daily functioning is not impaired. 9. Resolve the core conflict that is the source of anxiety. 10. Stabilize anxiety level while increasing ability to function on a daily basis. Diagnosis Axis none F33.1 Major Depressive Disorder  Axis none F41.1 General Anxiety Disorder  Conditions For Discharge Achievement of treatment goals and objectives   Desiree Fick, LCSW

## 2023-09-20 ENCOUNTER — Other Ambulatory Visit: Payer: Self-pay | Admitting: Family Medicine

## 2023-09-23 ENCOUNTER — Ambulatory Visit: Payer: Medicare Other | Admitting: Sports Medicine

## 2023-09-23 MED ORDER — PREGABALIN 50 MG PO CAPS: 50.0000 mg | ORAL_CAPSULE | Freq: Three times a day (TID) | ORAL | 2 refills | Status: DC

## 2023-09-23 NOTE — Telephone Encounter (Signed)
Requesting rx rf of Pregabalin. Last written 08/15/2023 Last ov 08/15/2023 video visit Upcoming appt 10/24/2023

## 2023-09-25 ENCOUNTER — Encounter: Payer: Self-pay | Admitting: Family Medicine

## 2023-09-26 ENCOUNTER — Ambulatory Visit: Payer: Medicare Other | Admitting: Sports Medicine

## 2023-09-30 DIAGNOSIS — H02231 Paralytic lagophthalmos right upper eyelid: Secondary | ICD-10-CM | POA: Diagnosis not present

## 2023-10-08 ENCOUNTER — Ambulatory Visit: Payer: Medicare Other

## 2023-10-08 ENCOUNTER — Encounter: Payer: Self-pay | Admitting: Sports Medicine

## 2023-10-08 ENCOUNTER — Ambulatory Visit (INDEPENDENT_AMBULATORY_CARE_PROVIDER_SITE_OTHER): Payer: Medicare Other | Admitting: Sports Medicine

## 2023-10-08 DIAGNOSIS — M25532 Pain in left wrist: Secondary | ICD-10-CM

## 2023-10-08 MED ORDER — CELECOXIB 200 MG PO CAPS: ORAL_CAPSULE | ORAL | 2 refills | Status: DC

## 2023-10-08 NOTE — Progress Notes (Signed)
    Procedures performed today:    None.  Independent interpretation of notes and tests performed by another provider:   None.  Brief History, Exam, Impression, and Recommendations:    Left wrist pain Pleasant 57 year old female, she gets ketamine infusions, she is on Butrans. She was somewhat sedated from a ketamine infusion and fell off of the toilet about a month ago. She then had some pain dorsal lateral left wrist, on exam it does appear a bit swollen, she has good motion, good strength, tenderness at the radial styloid and dorsal distal radius. No tenderness at the thumb basal joint. We did a Velcro brace, I would like x-rays, she can do some Celebrex as long as she monitors her blood pressure. No narcotics. Return to see me in 2 weeks.    ____________________________________________ Ihor Austin. Benjamin Stain, M.D., ABFM., CAQSM., AME. Primary Care and Sports Medicine [facility]  Adjunct Professor of Family Medicine  [employer] [facility] [employer] [facility] [employer]  Restaurant manager, fast food

## 2023-10-08 NOTE — Assessment & Plan Note (Signed)
Pleasant 57 year old female, she gets ketamine infusions, she is on Butrans. She was somewhat sedated from a ketamine infusion and fell off of the toilet about a month ago. She then had some pain dorsal lateral left wrist, on exam it does appear a bit swollen, she has good motion, good strength, tenderness at the radial styloid and dorsal distal radius. No tenderness at the thumb basal joint. We did a Velcro brace, I would like x-rays, she can do some Celebrex as long as she monitors her blood pressure. No narcotics. Return to see me in 2 weeks.

## 2023-10-16 DIAGNOSIS — G894 Chronic pain syndrome: Secondary | ICD-10-CM | POA: Diagnosis not present

## 2023-10-16 DIAGNOSIS — G501 Atypical facial pain: Secondary | ICD-10-CM | POA: Diagnosis not present

## 2023-10-16 DIAGNOSIS — Z79899 Other long term (current) drug therapy: Secondary | ICD-10-CM | POA: Diagnosis not present

## 2023-10-16 DIAGNOSIS — Z5181 Encounter for therapeutic drug level monitoring: Secondary | ICD-10-CM | POA: Diagnosis not present

## 2023-10-16 DIAGNOSIS — G500 Trigeminal neuralgia: Secondary | ICD-10-CM | POA: Diagnosis not present

## 2023-10-22 ENCOUNTER — Ambulatory Visit (INDEPENDENT_AMBULATORY_CARE_PROVIDER_SITE_OTHER): Payer: Medicare Other | Admitting: Sports Medicine

## 2023-10-22 ENCOUNTER — Encounter: Payer: Self-pay | Admitting: Sports Medicine

## 2023-10-22 VITALS — BP 147/94

## 2023-10-22 DIAGNOSIS — M25532 Pain in left wrist: Secondary | ICD-10-CM

## 2023-10-22 NOTE — Progress Notes (Signed)
    Procedures performed today:    Short arm cast placed today.  Independent interpretation of notes and tests performed by another provider:   I did personally review the x-rays, they look normal with the exception of some thumb basal joint arthritis and potentially a radial articular surface impaction noted only on the oblique view.  Brief History, Exam, Impression, and Recommendations:    Left wrist pain Shawnte returns, she is a 57 year old female, she does have chronic pain, she gets ketamine infusions and she is on Butrans. Historically in early September she was sedated from a ketamine infusion and fell off the toilet, she then had some pain dorsal lateral wrist over the distal radius and radial styloid, it appeared swollen on my exam, x-rays were obtained, official report is not back yet. They overall look normal on my personal review however on the oblique there is potentially an articular impaction type fracture. She has been immobilized in a Velcro brace but unfortunately is not improving, at this point she is now greater than 6 weeks status post the injury. Today we placed a short arm cast, Larrissa did note that it was comfortable and helped her pain. Will continue this for 4 weeks and if insufficient improvement at the 4-week point we take the cast off we will consider MRI.    ____________________________________________ Ihor Austin. Benjamin Stain, M.D., ABFM., CAQSM., AME. Primary Care and Sports Medicine [facility]  Adjunct Professor of Family Medicine  [place] [employer] [facility] [employer]  Restaurant manager, fast food

## 2023-10-22 NOTE — Assessment & Plan Note (Signed)
Sache returns, she is a 57 year old female, she does have chronic pain, she gets ketamine infusions and she is on Butrans. Historically in early September she was sedated from a ketamine infusion and fell off the toilet, she then had some pain dorsal lateral wrist over the distal radius and radial styloid, it appeared swollen on my exam, x-rays were obtained, official report is not back yet. They overall look normal on my personal review however on the oblique there is potentially an articular impaction type fracture. She has been immobilized in a Velcro brace but unfortunately is not improving, at this point she is now greater than 6 weeks status post the injury. Today we placed a short arm cast, Ailine did note that it was comfortable and helped her pain. Will continue this for 4 weeks and if insufficient improvement at the 4-week point we take the cast off we will consider MRI.

## 2023-10-24 ENCOUNTER — Ambulatory Visit: Payer: Medicare Other | Admitting: Psychology

## 2023-10-24 DIAGNOSIS — F411 Generalized anxiety disorder: Secondary | ICD-10-CM | POA: Diagnosis not present

## 2023-10-24 DIAGNOSIS — F331 Major depressive disorder, recurrent, moderate: Secondary | ICD-10-CM | POA: Diagnosis not present

## 2023-10-24 NOTE — Progress Notes (Signed)
[facility] Counselor/Therapist Progress Note  Patient ID: Desiree Wilson, MRN: [id number],    Date: 10/24/2023  Time Spent: 10:00am-10:50am   50 minutes   Treatment Type: Individual Therapy  Reported Symptoms: stress, pain  Mental Status Exam: Appearance:  Casual     Behavior: Appropriate  Motor: Normal  Speech/Language:  Normal Rate  Affect: Appropriate  Mood: normal  Thought process: normal  Thought content:   WNL  Sensory/Perceptual disturbances:   WNL  Orientation: oriented to person, place, time/date, and situation  Attention: Good  Concentration: Good  Memory: WNL  Fund of knowledge:  Good  Insight:   Good  Judgment:  Good  Impulse Control: Good   Risk Assessment: Danger to Self:  No Self-injurious Behavior: No Danger to Others: No Duty to Warn:no Physical Aggression / Violence:No  Access to Firearms a concern: No  Gang Involvement:No   Subjective: Pt present for face-to-face individual therapy via video.  Pt consents to telehealth video session and is aware of limitations and benefits of virtual sessions. Location of pt: home Location of therapist: home office.   Pt talked about her health.  She is working with her providers on trying to improve her pain management.   She is having ketamine infusions monthly and has had her pain patch dosage increased.   Pt realizes she needs to pace herself when she has a good day so she doesn't "pay for it" later regarding increased pain.   Pt talked about her relationship with her husband Desiree Wilson.  They have been working on communication and trying to enjoy each other.  Desiree Wilson is looking for a therapist.   Helped pt process her feelings and relationship dynamics.   Addressed how she can effectively communicate her needs to [facility].  Worked on self care strategies. Provided supportive therapy.    Interventions: Cognitive Behavioral Therapy and Insight-Oriented  Diagnosis:  F33.1 and F41.1  Plan: Plan of Care:  Recommend ongoing therapy.   Pt participated with setting treatment goals.   Pt states her goals for therapy are to have a place to process her feelings.   Pt wants to improve coping skills.   Plan to meet every month.    Treatment Plan (Treatment Plan Target Date: 2/29/2025) Client Abilities/Strengths  Pt is bright, engaging, and motivated for therapy.  Client Treatment Preferences  Individual therapy.  Client Statement of Needs  Improve copings skills and have a safe place to talk about feelings.   Symptoms  Depressed or irritable mood. Excessive and/or unrealistic worry that is difficult to control occurring more days than not for at least 6 months about a number of events or activities. Hypervigilance (e.g., feeling constantly on edge, experiencing concentration difficulties, having trouble falling or staying asleep, exhibiting a general state of irritability).  Problems Addressed  Unipolar Depression, Anxiety Goals 1. Alleviate depressive symptoms and return to previous level of effective functioning. 2. Appropriately grieve the loss in order to normalize mood and to return to previously adaptive level of functioning. Objective Learn and implement behavioral strategies to overcome depression. Target Date: 2025-02-29 Frequency: Monthly  Progress: 40 Modality: individual  Related Interventions Assist the client in developing skills that increase the likelihood of deriving pleasure from behavioral activation (e.g., assertiveness skills, developing an exercise plan, less internal/more external focus, increased social involvement); reinforce success. Engage the client in "behavioral activation," increasing his/her activity level and contact with sources of reward, while identifying processes that inhibit activation. use behavioral techniques such as instruction, rehearsal, role-playing,  role reversal, as needed, to facilitate activity in the client's daily life; reinforce success. 3. Develop  healthy interpersonal relationships that lead to the alleviation and help prevent the relapse of depression. 4. Develop healthy thinking patterns and beliefs about self, others, and the world that lead to the alleviation and help prevent the relapse of depression. 5. Enhance ability to effectively cope with the full variety of life's worries and anxieties. 6. Learn and implement coping skills that result in a reduction of anxiety and worry, and improved daily functioning. Objective Learn and implement problem-solving strategies for realistically addressing worries. Target Date: 2025-02-29 Frequency: Monthly  Progress: 40 Modality: individual  Related Interventions Assign the client a homework exercise in which he/she problem-solves a current problem (see Mastery of Your Anxiety and Worry: Workbook by Elenora Fender and Filbert Schilder or Generalized Anxiety Disorder by Elesa Hacker, and Filbert Schilder); review, reinforce success, and provide corrective feedback toward improvement. Teach the client problem-solving strategies involving specifically defining a problem, generating options for addressing it, evaluating the pros and cons of each option, selecting and implementing an optional action, and reevaluating and refining the action. Objective Learn and implement calming skills to reduce overall anxiety and manage anxiety symptoms. Target Date: 2025-02-29 Frequency: Monthly  Progress: 40 Modality: individual  Related Interventions Assign the client to read about progressive muscle relaxation and other calming strategies in relevant books or treatment manuals (e.g., Progressive Relaxation Training by Twana First; Mastery of Your Anxiety and Worry: Workbook by Earlie Counts). Assign the client homework each session in which he/she practices relaxation exercises daily, gradually applying them progressively from non-anxiety-provoking to anxiety-provoking situations; review and reinforce success while providing  corrective feedback toward improvement. Teach the client calming/relaxation skills (e.g., applied relaxation, progressive muscle relaxation, cue controlled relaxation; mindful breathing; biofeedback) and how to discriminate better between relaxation and tension; teach the client how to apply these skills to his/her daily life. 7. Recognize, accept, and cope with feelings of depression. 8. Reduce overall frequency, intensity, and duration of the anxiety so that daily functioning is not impaired. 9. Resolve the core conflict that is the source of anxiety. 10. Stabilize anxiety level while increasing ability to function on a daily basis. Diagnosis Axis none F33.1 Major Depressive Disorder  Axis none F41.1 General Anxiety Disorder  Conditions For Discharge Achievement of treatment goals and objectives   Salomon Fick, LCSW

## 2023-10-28 ENCOUNTER — Encounter: Payer: Self-pay | Admitting: Family Medicine

## 2023-10-28 ENCOUNTER — Ambulatory Visit (INDEPENDENT_AMBULATORY_CARE_PROVIDER_SITE_OTHER): Payer: Medicare Other | Admitting: Family Medicine

## 2023-10-28 VITALS — BP 132/78 | HR 70 | Ht 65.0 in | Wt 208.0 lb

## 2023-10-28 DIAGNOSIS — R7301 Impaired fasting glucose: Secondary | ICD-10-CM | POA: Insufficient documentation

## 2023-10-28 DIAGNOSIS — E785 Hyperlipidemia, unspecified: Secondary | ICD-10-CM

## 2023-10-28 DIAGNOSIS — N182 Chronic kidney disease, stage 2 (mild): Secondary | ICD-10-CM | POA: Diagnosis not present

## 2023-10-28 DIAGNOSIS — F331 Major depressive disorder, recurrent, moderate: Secondary | ICD-10-CM | POA: Diagnosis not present

## 2023-10-28 DIAGNOSIS — I10 Essential (primary) hypertension: Secondary | ICD-10-CM

## 2023-10-28 DIAGNOSIS — G500 Trigeminal neuralgia: Secondary | ICD-10-CM

## 2023-10-28 DIAGNOSIS — Z23 Encounter for immunization: Secondary | ICD-10-CM

## 2023-10-28 DIAGNOSIS — R790 Abnormal level of blood mineral: Secondary | ICD-10-CM

## 2023-10-28 DIAGNOSIS — J0100 Acute maxillary sinusitis, unspecified: Secondary | ICD-10-CM

## 2023-10-28 DIAGNOSIS — N952 Postmenopausal atrophic vaginitis: Secondary | ICD-10-CM

## 2023-10-28 MED ORDER — ESTRADIOL 0.1 MG/GM VA CREA: 1.0000 | TOPICAL_CREAM | Freq: Every day | VAGINAL | 3 refills | Status: AC

## 2023-10-28 MED ORDER — FLUTICASONE PROPIONATE 50 MCG/ACT NA SUSP
1.0000 | Freq: Every day | NASAL | 1 refills | Status: DC
Start: 2023-10-28 — End: 2024-04-03

## 2023-10-28 NOTE — Assessment & Plan Note (Addendum)
Initial BP high. Getting 120-130s at home.  Well controlled. Continue current regimen. Follow up in  53mo

## 2023-10-28 NOTE — Assessment & Plan Note (Signed)
Getting some improvement with the Ketamine infusions.she feels the Lyrica has been helpful as well.

## 2023-10-28 NOTE — Assessment & Plan Note (Signed)
F/U renal function Q 6 mo

## 2023-10-28 NOTE — Assessment & Plan Note (Signed)
Happy with inc dose of Cymbalta. Will continue for now and reconsider decreasing after the Holidays.

## 2023-10-28 NOTE — Progress Notes (Signed)
Established Patient Office Visit  Subjective   Patient ID: Desiree Wilson, female    DOB: 1966/05/22  Age: 57 y.o. MRN: [id number]  Chief Complaint  Patient presents with   Hypertension    HPI  Hypertension- Pt denies chest pain, SOB, dizziness, or heart palpitations.  Taking meds as directed w/o problems.  Denies medication side effects.    F/U MDD -overall she actually feels like the bump in the Cymbalta which was originally for pain has actually been helpful for her mood she would like to stay on the higher dose at least through the holidays and then maybe consider going back down after January.  F/U CKD 3 -no recent changes.   Trigeminal neuralgia-she is back on her ketamine infusions she did not get a lot of relief after the first 1 but feels like she is already feeling some better after her second 1 so she is hopeful after the third 1 that she will feel even better she has been able to at least do some light housecleaning and has been able to get out of the house to meet with friends.    ROS    Objective:     BP 132/78   Pulse 70   Ht 5\' 5"  (1.651 m)   Wt 208 lb (94.3 kg)   SpO2 100%   BMI 34.61 kg/m    Physical Exam Vitals and nursing note reviewed.  Constitutional:      Appearance: Normal appearance.  HENT:     Head: Normocephalic and atraumatic.  Eyes:     Conjunctiva/sclera: Conjunctivae normal.  Cardiovascular:     Rate and Rhythm: Normal rate and regular rhythm.  Pulmonary:     Effort: Pulmonary effort is normal.     Breath sounds: Normal breath sounds.  Skin:    General: Skin is warm and dry.  Neurological:     Mental Status: She is alert.  Psychiatric:        Mood and Affect: Mood normal.     No results found for any visits on 10/28/23.    The 10-year ASCVD risk score (Arnett DK, et al., 2019) is: 5.7%    Assessment & Plan:   Problem List Items Addressed This Visit       Cardiovascular and Mediastinum   Primary hypertension -  Primary    Initial BP high. Getting 120-130s at home.  Well controlled. Continue current regimen. Follow up in  97mo       Relevant Orders   CMP14+EGFR   Urine Microalbumin w/creat. ratio     Endocrine   IFG (impaired fasting glucose)    Lab Results  Component Value Date   HGBA1C 5.8 (H) 05/11/2021  Continue to monitor.        Relevant Orders   Hemoglobin A1c     Nervous and Auditory   Trigeminal neuralgia    Getting some improvement with the Ketamine infusions.she feels the Lyrica has been helpful as well.       Relevant Medications   clonazePAM (KLONOPIN) 1 MG tablet     Genitourinary   Vaginal atrophy   Relevant Medications   estradiol (ESTRACE) 0.1 MG/GM vaginal cream   CKD (chronic kidney disease) stage 2, GFR 60-89 ml/min    F/U renal function Q 6 mo       Relevant Orders   CMP14+EGFR   Urine Microalbumin w/creat. ratio     Other   Moderate episode of recurrent major depressive disorder (HCC)  Happy with inc dose of Cymbalta. Will continue for now and reconsider decreasing after the Holidays.        Hyperlipidemia   Other Visit Diagnoses     Low ferritin       Relevant Orders   Ferritin   Encounter for immunization       Relevant Orders   Flu vaccine trivalent PF, 6mos and older(Flulaval,Afluria,Fluarix,Fluzone) (Completed)   Acute non-recurrent maxillary sinusitis       Relevant Medications   fluticasone (FLONASE) 50 MCG/ACT nasal spray       Return in about 6 months (around 04/27/2024) for Hypertension.    Nani Gasser, MD

## 2023-10-28 NOTE — Assessment & Plan Note (Signed)
Lab Results  Component Value Date   HGBA1C 5.8 (H) 05/11/2021  Continue to monitor.

## 2023-11-12 DIAGNOSIS — G894 Chronic pain syndrome: Secondary | ICD-10-CM | POA: Diagnosis not present

## 2023-11-12 DIAGNOSIS — G500 Trigeminal neuralgia: Secondary | ICD-10-CM | POA: Diagnosis not present

## 2023-11-12 DIAGNOSIS — F32A Depression, unspecified: Secondary | ICD-10-CM | POA: Diagnosis not present

## 2023-11-12 DIAGNOSIS — G501 Atypical facial pain: Secondary | ICD-10-CM | POA: Diagnosis not present

## 2023-11-17 ENCOUNTER — Encounter: Payer: Self-pay | Admitting: Gastroenterology

## 2023-11-18 ENCOUNTER — Ambulatory Visit (INDEPENDENT_AMBULATORY_CARE_PROVIDER_SITE_OTHER): Payer: Medicare Other | Admitting: Psychology

## 2023-11-18 DIAGNOSIS — F331 Major depressive disorder, recurrent, moderate: Secondary | ICD-10-CM

## 2023-11-18 DIAGNOSIS — F411 Generalized anxiety disorder: Secondary | ICD-10-CM | POA: Diagnosis not present

## 2023-11-18 MED ORDER — DICYCLOMINE HCL 10 MG PO CAPS: 10.0000 mg | ORAL_CAPSULE | Freq: Three times a day (TID) | ORAL | 1 refills | Status: AC | PRN

## 2023-11-18 NOTE — Progress Notes (Signed)
[facility] Counselor/Therapist Progress Note  Patient ID: Desiree Wilson, MRN: [id number],    Date: 11/18/2023  Time Spent: 10:00am-10:55am   55 minutes   Treatment Type: Individual Therapy  Reported Symptoms: stress, pain  Mental Status Exam: Appearance:  Casual     Behavior: Appropriate  Motor: Normal  Speech/Language:  Normal Rate  Affect: Appropriate  Mood: normal  Thought process: normal  Thought content:   WNL  Sensory/Perceptual disturbances:   WNL  Orientation: oriented to person, place, time/date, and situation  Attention: Good  Concentration: Good  Memory: WNL  Fund of knowledge:  Good  Insight:   Good  Judgment:  Good  Impulse Control: Good   Risk Assessment: Danger to Self:  No Self-injurious Behavior: No Danger to Others: No Duty to Warn:no Physical Aggression / Violence:No  Access to Firearms a concern: No  Gang Involvement:No   Subjective: Pt present for face-to-face individual therapy via video.  Pt consents to telehealth video session and is aware of limitations and benefits of virtual sessions. Location of pt: home Location of therapist: home office.   Pt talked about her health.   Pt saw her pain medicine doctor last week.  She is getting ketamine infusions.   The infusions are not helping as much as pt would like.  Pt states she has been depressed bc of her pain and at times feels like she does not want to live anymore. Pt was feeling hopeless.  Her pain doctor increased her pain patch which has helped.  Pt is not suicidal now.   She states she feels better. Worked with pt on healthy coping strategies. Pt talked about her relationship with [name].  He did not take her seriously when she was feeling suicidal.   Addressed how pt can have a talk with him about this.  Helped pt process her feelings and relationship dynamics.   Addressed how she can effectively communicate her needs to [facility].  Pt talked about the election and  feeling worried and scared since Trump won the presidential election.   Helped pt process her feelings and worked on present moment mindfulness.   Pt is doing crafts with a friend and is planning to make a lot of her holiday gifts.  This is a positive activity for pt.  Worked on self care strategies. Provided supportive therapy.    Interventions: Cognitive Behavioral Therapy and Insight-Oriented  Diagnosis:  F33.1 and F41.1  Plan: Plan of Care: Recommend ongoing therapy.   Pt participated with setting treatment goals.   Pt states her goals for therapy are to have a place to process her feelings.   Pt wants to improve coping skills.   Plan to meet every month.    Treatment Plan (Treatment Plan Target Date: 2/29/2025) Client Abilities/Strengths  Pt is bright, engaging, and motivated for therapy.  Client Treatment Preferences  Individual therapy.  Client Statement of Needs  Improve copings skills and have a safe place to talk about feelings.   Symptoms  Depressed or irritable mood. Excessive and/or unrealistic worry that is difficult to control occurring more days than not for at least 6 months about a number of events or activities. Hypervigilance (e.g., feeling constantly on edge, experiencing concentration difficulties, having trouble falling or staying asleep, exhibiting a general state of irritability).  Problems Addressed  Unipolar Depression, Anxiety Goals 1. Alleviate depressive symptoms and return to previous level of effective functioning. 2. Appropriately grieve the loss in order to normalize mood and  to return to previously adaptive level of functioning. Objective Learn and implement behavioral strategies to overcome depression. Target Date: 2025-02-29 Frequency: Monthly  Progress: 40 Modality: individual  Related Interventions Assist the client in developing skills that increase the likelihood of deriving pleasure from behavioral activation (e.g., assertiveness skills,  developing an exercise plan, less internal/more external focus, increased social involvement); reinforce success. Engage the client in "behavioral activation," increasing his/her activity level and contact with sources of reward, while identifying processes that inhibit activation. use behavioral techniques such as instruction, rehearsal, role-playing, role reversal, as needed, to facilitate activity in the client's daily life; reinforce success. 3. Develop healthy interpersonal relationships that lead to the alleviation and help prevent the relapse of depression. 4. Develop healthy thinking patterns and beliefs about self, others, and the world that lead to the alleviation and help prevent the relapse of depression. 5. Enhance ability to effectively cope with the full variety of life's worries and anxieties. 6. Learn and implement coping skills that result in a reduction of anxiety and worry, and improved daily functioning. Objective Learn and implement problem-solving strategies for realistically addressing worries. Target Date: 2025-02-29 Frequency: Monthly  Progress: 40 Modality: individual  Related Interventions Assign the client a homework exercise in which he/she problem-solves a current problem (see Mastery of Your Anxiety and Worry: Workbook by Elenora Fender and Filbert Schilder or Generalized Anxiety Disorder by Elesa Hacker, and Filbert Schilder); review, reinforce success, and provide corrective feedback toward improvement. Teach the client problem-solving strategies involving specifically defining a problem, generating options for addressing it, evaluating the pros and cons of each option, selecting and implementing an optional action, and reevaluating and refining the action. Objective Learn and implement calming skills to reduce overall anxiety and manage anxiety symptoms. Target Date: 2025-02-29 Frequency: Monthly  Progress: 40 Modality: individual  Related Interventions Assign the client to read about  progressive muscle relaxation and other calming strategies in relevant books or treatment manuals (e.g., Progressive Relaxation Training by Twana First; Mastery of Your Anxiety and Worry: Workbook by Earlie Counts). Assign the client homework each session in which he/she practices relaxation exercises daily, gradually applying them progressively from non-anxiety-provoking to anxiety-provoking situations; review and reinforce success while providing corrective feedback toward improvement. Teach the client calming/relaxation skills (e.g., applied relaxation, progressive muscle relaxation, cue controlled relaxation; mindful breathing; biofeedback) and how to discriminate better between relaxation and tension; teach the client how to apply these skills to his/her daily life. 7. Recognize, accept, and cope with feelings of depression. 8. Reduce overall frequency, intensity, and duration of the anxiety so that daily functioning is not impaired. 9. Resolve the core conflict that is the source of anxiety. 10. Stabilize anxiety level while increasing ability to function on a daily basis. Diagnosis Axis none F33.1 Major Depressive Disorder  Axis none F41.1 General Anxiety Disorder  Conditions For Discharge Achievement of treatment goals and objectives   Salomon Fick, LCSW

## 2023-11-19 ENCOUNTER — Ambulatory Visit (INDEPENDENT_AMBULATORY_CARE_PROVIDER_SITE_OTHER): Payer: Medicare Other | Admitting: Sports Medicine

## 2023-11-19 ENCOUNTER — Encounter: Payer: Self-pay | Admitting: Sports Medicine

## 2023-11-19 DIAGNOSIS — R790 Abnormal level of blood mineral: Secondary | ICD-10-CM | POA: Diagnosis not present

## 2023-11-19 DIAGNOSIS — R7301 Impaired fasting glucose: Secondary | ICD-10-CM | POA: Diagnosis not present

## 2023-11-19 DIAGNOSIS — M65311 Trigger thumb, right thumb: Secondary | ICD-10-CM | POA: Diagnosis not present

## 2023-11-19 DIAGNOSIS — M25532 Pain in left wrist: Secondary | ICD-10-CM

## 2023-11-19 DIAGNOSIS — N182 Chronic kidney disease, stage 2 (mild): Secondary | ICD-10-CM | POA: Diagnosis not present

## 2023-11-19 DIAGNOSIS — I10 Essential (primary) hypertension: Secondary | ICD-10-CM | POA: Diagnosis not present

## 2023-11-19 NOTE — Progress Notes (Signed)
    Procedures performed today:    None.  Independent interpretation of notes and tests performed by another provider:   None.  Brief History, Exam, Impression, and Recommendations:    Left wrist pain Tieraney returns, she is a very pleasant 57 year old female, chronic pain, she gets ketamine infusions and is on a Butrans patch. She had a fall early September, dorsal lateral wrist pain over the distal radius and radial styloid that appeared swollen, x-rays at the time based on my interpretation did show potentially an articular type impaction fracture on the oblique view only. It was read as negative by radiology. Was not improving in a Velcro brace so we placed a short arm cast, now today 4 weeks later the cast is removed, she is doing a lot better, swelling is a lot better. I would still like her to minimize her lifting to no more than 5 pounds, and adding home physical therapy, return to see me in a month.  Trigger thumb, right thumb New onset trigger thumb, we last did an injection in May, since we just took her cast off I would like another month before considering additional steroids, but in a month if still having painful triggering we will do a right flexor pollicis longus tendon sheath injection with ultrasound guidance.    ____________________________________________ Ihor Austin. Benjamin Stain, M.D., ABFM., CAQSM., AME. Primary Care and Sports Medicine [facility]  Adjunct Professor of Family Medicine  [employer]  Restaurant manager, fast food

## 2023-11-19 NOTE — Assessment & Plan Note (Signed)
New onset trigger thumb, we last did an injection in May, since we just took her cast off I would like another month before considering additional steroids, but in a month if still having painful triggering we will do a right flexor pollicis longus tendon sheath injection with ultrasound guidance.

## 2023-11-19 NOTE — Assessment & Plan Note (Signed)
Cadey returns, she is a very pleasant 57 year old female, chronic pain, she gets ketamine infusions and is on a Butrans patch. She had a fall early September, dorsal lateral wrist pain over the distal radius and radial styloid that appeared swollen, x-rays at the time based on my interpretation did show potentially an articular type impaction fracture on the oblique view only. It was read as negative by radiology. Was not improving in a Velcro brace so we placed a short arm cast, now today 4 weeks later the cast is removed, she is doing a lot better, swelling is a lot better. I would still like her to minimize her lifting to no more than 5 pounds, and adding home physical therapy, return to see me in a month.

## 2023-11-20 DIAGNOSIS — Z79899 Other long term (current) drug therapy: Secondary | ICD-10-CM | POA: Diagnosis not present

## 2023-11-20 DIAGNOSIS — Z5181 Encounter for therapeutic drug level monitoring: Secondary | ICD-10-CM | POA: Diagnosis not present

## 2023-11-20 LAB — CMP14+EGFR
ALT: 14 [IU]/L (ref 0–32)
AST: 16 [IU]/L (ref 0–40)
Albumin: 4.7 g/dL (ref 3.8–4.9)
Alkaline Phosphatase: 127 [IU]/L — ABNORMAL HIGH (ref 44–121)
BUN/Creatinine Ratio: 17 (ref 9–23)
BUN: 18 mg/dL (ref 6–24)
Bilirubin Total: 0.4 mg/dL (ref 0.0–1.2)
CO2: 23 mmol/L (ref 20–29)
Calcium: 9.6 mg/dL (ref 8.7–10.2)
Chloride: 93 mmol/L — ABNORMAL LOW (ref 96–106)
Creatinine, Ser: 1.03 mg/dL — ABNORMAL HIGH (ref 0.57–1.00)
Globulin, Total: 2.4 g/dL (ref 1.5–4.5)
Glucose: 96 mg/dL (ref 70–99)
Potassium: 4.7 mmol/L (ref 3.5–5.2)
Sodium: 131 mmol/L — ABNORMAL LOW (ref 134–144)
Total Protein: 7.1 g/dL (ref 6.0–8.5)
eGFR: 63 mL/min/{1.73_m2} (ref >59)

## 2023-11-20 LAB — HEMOGLOBIN A1C
Est. average glucose Bld gHb Est-mCnc: 114 mg/dL
Hgb A1c MFr Bld: 5.6 % (ref 4.8–5.6)

## 2023-11-20 LAB — MICROALBUMIN / CREATININE URINE RATIO
Creatinine, Urine: 78.1 mg/dL
Microalb/Creat Ratio: 34 mg/g{creat} — ABNORMAL HIGH (ref 0–29)
Microalbumin, Urine: 26.5 ug/mL

## 2023-11-20 LAB — FERRITIN: Ferritin: 17 ng/mL (ref 15–150)

## 2023-11-20 NOTE — Progress Notes (Signed)
Hi Keshayla, kidney function is fairly stable usually bounce between 0.9 and 1.0.  Sodium level still low but it does look better than it did 4 months ago so we will keep an eye on this.  Alkaline phosphatase which is a type of liver enzymes up just a couple of points nothing in a worrisome range but we will keep an eye on it.  Your iron stores are low again.  Are you taking any extra iron right now?  Your A1c looks good.

## 2023-11-21 NOTE — Progress Notes (Signed)
Hi Desiree Wilson, k still just a little protein in the urine but it does look better it is almost back to normal which is reassuring so maybe lets plan to check this again in 6 months.  Sure to pick up some iron and start taking it if you are not already and we will plan to recheck your iron again in about 2 months.

## 2023-11-21 NOTE — Progress Notes (Signed)
See if she would be able to increase her iron to 1 twice a day.  She might need to take a stool softener with it if it is too constipating and she can still stick with once a day but lets plan to recheck levels again in 6 to 8 weeks to make sure it is improving.

## 2023-11-22 ENCOUNTER — Ambulatory Visit: Payer: Self-pay | Admitting: Family Medicine

## 2023-11-22 NOTE — Telephone Encounter (Signed)
Copied from CRM [id number]. Topic: Clinical - Medical Advice >> Nov 22, 2023  8:29 AM Fonda Kinder J wrote: Reason for CRM: Patient had an infusion on 11/19 after appointment and received a beta blocker for the first time due to blood pressure and wanted to follow up with provider for advice, patient requested a callback  Chief Complaint: patient called to make appt with pcp due to elevated bp during infusions. Symptoms: blood pressure only elevates during infusion Frequency: monthly Pertinent Negatives: Patient denies any other symptoms, headache Disposition: [] ED /[] Urgent Care (no appt availability in office) / [x] Appointment(In office/virtual)/ []  [facility]/ [] Home Care/ [] Refused Recommended Disposition /[] [facility]/ []  Follow-up with PCP Additional Notes: patient called to make appt with pcp due to elevated bp during infusion.  States she does not have hx of high blood pressure and they only have her a beta blocker one time via iv during her infusion.  Would like to discuss need for beta blockers with infusion.   Reason for Disposition  Systolic BP  >= 160 OR Diastolic >= 100  Answer Assessment - Initial Assessment Questions 1. BLOOD PRESSURE: "What is the blood pressure?" "Did you take at least two measurements 5 minutes apart?"     States that during infusion her bp elevates to 200 or "something"2 2. ONSET: "When did you take your blood pressure?"     Last month during infusion 4. HISTORY: "Do you have a history of high blood pressure?"     No, only during infusion 5. MEDICINES: "Are you taking any medicines for blood pressure?" "Have you missed any doses recently?"     Gave me a beta blocker through my IV for elevated bp one time during infusion.  6. OTHER SYMPTOMS: "Do you have any symptoms?" (e.g., blurred vision, chest pain, difficulty breathing, headache, weakness)     no  Protocols used: Blood Pressure - High-A-AH

## 2023-11-22 NOTE — Telephone Encounter (Signed)
Copied from CRM [id number]. Topic: Clinical - Medical Advice >> Nov 22, 2023  8:29 AM Fonda Kinder J wrote: Reason for CRM: Patient had an infusion on 11/19 after appointment and received a beta blocker for the first time due to blood pressure and wanted to follow up with provider for advice, patient requested a callback  LVM to patient for call back for clarification to forward message to PCP.

## 2023-11-27 ENCOUNTER — Other Ambulatory Visit: Payer: Self-pay | Admitting: *Deleted

## 2023-11-27 DIAGNOSIS — R790 Abnormal level of blood mineral: Secondary | ICD-10-CM

## 2023-12-12 ENCOUNTER — Encounter: Payer: Self-pay | Admitting: Family Medicine

## 2023-12-16 ENCOUNTER — Ambulatory Visit: Payer: Medicare Other | Admitting: Psychology

## 2023-12-16 DIAGNOSIS — F411 Generalized anxiety disorder: Secondary | ICD-10-CM | POA: Diagnosis not present

## 2023-12-16 DIAGNOSIS — F331 Major depressive disorder, recurrent, moderate: Secondary | ICD-10-CM

## 2023-12-16 NOTE — Progress Notes (Signed)
[facility] Counselor/Therapist Progress Note  Patient ID: SANAY MAHIN, MRN: [id number],    Date: 12/16/2023  Time Spent: 10:00am-10:55am   55 minutes   Treatment Type: Individual Therapy  Reported Symptoms: stress, pain  Mental Status Exam: Appearance:  Casual     Behavior: Appropriate  Motor: Normal  Speech/Language:  Normal Rate  Affect: Appropriate  Mood: normal  Thought process: normal  Thought content:   WNL  Sensory/Perceptual disturbances:   WNL  Orientation: oriented to person, place, time/date, and situation  Attention: Good  Concentration: Good  Memory: WNL  Fund of knowledge:  Good  Insight:   Good  Judgment:  Good  Impulse Control: Good   Risk Assessment: Danger to Self:  No Self-injurious Behavior: No Danger to Others: No Duty to Warn:no Physical Aggression / Violence:No  Access to Firearms a concern: No  Gang Involvement:No   Subjective: Pt present for face-to-face individual therapy via video.  Pt consents to telehealth video session and is aware of limitations and benefits of virtual sessions. Location of pt: home Location of therapist: home office.   Pt talked about  feeling depressed.  She was tearful as she talked about being in increased pain.  She is also upset about finances bc of medical bills and vet bills. Pt talked about her best friend Brett Canales who is in the hospital.  Pt is concerned about him.  They confide in each other and understand each other's medical challenges. Pt talked about losing a friend bc of her pain.  The friend wrote a mean text to pt telling her she is sick of hearing about pt's pain.  Addressed how this impacted pt.   Helped pt process her feelings.   Pt talked about her relationship with [name].   He is trying to be more understanding.  His mother will be with them for the holidays for a couple of weeks.  Pt gets along well with her. Worked on self care strategies. Provided supportive therapy.     Interventions: Cognitive Behavioral Therapy and Insight-Oriented  Diagnosis:  F33.1 and F41.1  Plan: Plan of Care: Recommend ongoing therapy.   Pt participated with setting treatment goals.   Pt states her goals for therapy are to have a place to process her feelings.   Pt wants to improve coping skills.   Plan to meet every month.    Treatment Plan (Treatment Plan Target Date: 2/29/2025) Client Abilities/Strengths  Pt is bright, engaging, and motivated for therapy.  Client Treatment Preferences  Individual therapy.  Client Statement of Needs  Improve copings skills and have a safe place to talk about feelings.   Symptoms  Depressed or irritable mood. Excessive and/or unrealistic worry that is difficult to control occurring more days than not for at least 6 months about a number of events or activities. Hypervigilance (e.g., feeling constantly on edge, experiencing concentration difficulties, having trouble falling or staying asleep, exhibiting a general state of irritability).  Problems Addressed  Unipolar Depression, Anxiety Goals 1. Alleviate depressive symptoms and return to previous level of effective functioning. 2. Appropriately grieve the loss in order to normalize mood and to return to previously adaptive level of functioning. Objective Learn and implement behavioral strategies to overcome depression. Target Date: 2025-02-29 Frequency: Monthly  Progress: 40 Modality: individual  Related Interventions Assist the client in developing skills that increase the likelihood of deriving pleasure from behavioral activation (e.g., assertiveness skills, developing an exercise plan, less internal/more external focus, increased social involvement);  reinforce success. Engage the client in "behavioral activation," increasing his/her activity level and contact with sources of reward, while identifying processes that inhibit activation. use behavioral techniques such as instruction,  rehearsal, role-playing, role reversal, as needed, to facilitate activity in the client's daily life; reinforce success. 3. Develop healthy interpersonal relationships that lead to the alleviation and help prevent the relapse of depression. 4. Develop healthy thinking patterns and beliefs about self, others, and the world that lead to the alleviation and help prevent the relapse of depression. 5. Enhance ability to effectively cope with the full variety of life's worries and anxieties. 6. Learn and implement coping skills that result in a reduction of anxiety and worry, and improved daily functioning. Objective Learn and implement problem-solving strategies for realistically addressing worries. Target Date: 2025-02-29 Frequency: Monthly  Progress: 40 Modality: individual  Related Interventions Assign the client a homework exercise in which he/she problem-solves a current problem (see Mastery of Your Anxiety and Worry: Workbook by Elenora Fender and Filbert Schilder or Generalized Anxiety Disorder by Elesa Hacker, and Filbert Schilder); review, reinforce success, and provide corrective feedback toward improvement. Teach the client problem-solving strategies involving specifically defining a problem, generating options for addressing it, evaluating the pros and cons of each option, selecting and implementing an optional action, and reevaluating and refining the action. Objective Learn and implement calming skills to reduce overall anxiety and manage anxiety symptoms. Target Date: 2025-02-29 Frequency: Monthly  Progress: 40 Modality: individual  Related Interventions Assign the client to read about progressive muscle relaxation and other calming strategies in relevant books or treatment manuals (e.g., Progressive Relaxation Training by Twana First; Mastery of Your Anxiety and Worry: Workbook by Earlie Counts). Assign the client homework each session in which he/she practices relaxation exercises daily, gradually  applying them progressively from non-anxiety-provoking to anxiety-provoking situations; review and reinforce success while providing corrective feedback toward improvement. Teach the client calming/relaxation skills (e.g., applied relaxation, progressive muscle relaxation, cue controlled relaxation; mindful breathing; biofeedback) and how to discriminate better between relaxation and tension; teach the client how to apply these skills to his/her daily life. 7. Recognize, accept, and cope with feelings of depression. 8. Reduce overall frequency, intensity, and duration of the anxiety so that daily functioning is not impaired. 9. Resolve the core conflict that is the source of anxiety. 10. Stabilize anxiety level while increasing ability to function on a daily basis. Diagnosis Axis none F33.1 Major Depressive Disorder  Axis none F41.1 General Anxiety Disorder  Conditions For Discharge Achievement of treatment goals and objectives   Salomon Fick, LCSW

## 2023-12-17 ENCOUNTER — Ambulatory Visit: Payer: Medicare Other | Admitting: Sports Medicine

## 2023-12-17 ENCOUNTER — Ambulatory Visit: Payer: Self-pay | Admitting: Family Medicine

## 2023-12-17 NOTE — Telephone Encounter (Signed)
Chief Complaint: pain Symptoms: right ear/right jaw pain Frequency: x 7 days Pertinent Negatives: Patient denies fever, rash/redness/swelling of face, SOB, chest pain, changes in speech/vision/balance, chest congestion, runny nose, headaches  Disposition: [] ED /[] Urgent Care (no appt availability in office) / [] Appointment(In office/virtual)/ []  [facility]/ [] Home Care/ [] Refused Recommended Disposition /[] [facility]/ [x]  Follow-up with Specialist Additional Notes: Patient c/o flare up of her atypical trigeminal neuralgia x 7 days. Pt states she has been taking her prescribed pain medication but states pain is a  10/10. Pt requesting to cancel her appt with PCP this morning so she can call [facility] to follow up/make and appt. Pt refused offer for virtual visit with HCP.   Copied from CRM [id number]. Topic: Appointments - Appointment Cancel/Reschedule >> Dec 17, 2023  8:09 AM Dimitri Ped wrote: Patient/patient representative is calling to cancel or reschedule an appointment. Refer to attachments for appointment information. Patient crying and needing to cancel appointment and she is hurting bad Reason for Disposition  [1] SEVERE pain (e.g., excruciating) AND [2] not improved after 2 hours of pain medicine  Answer Assessment - Initial Assessment Questions 1. ONSET: "When did the pain start?" (e.g., minutes, hours, days)     Pt states she is having a flare up of her atypical trigeminal neuralgia, Dec 11th is when this flare up started.  2. ONSET: "Does the pain come and go, or has it been constant since it started?" (e.g., constant, intermittent, fleeting)     Pt states if she takes her medication and falls asleep, the pain improves. "I'll wake up and its subsided a little bit". Pt states she has been taking "ketamine nose spray, and I have toradol and klonopin. And I also take tizanidine. I wear a pain patch too".  3. SEVERITY: "How bad is the pain?"   (Scale 1-10; mild,  moderate or severe)   - MILD (1-3): doesn't interfere with normal activities    - MODERATE (4-7): interferes with normal activities or awakens from sleep    - SEVERE (8-10): excruciating pain, unable to do any normal activities      10/10.  4. LOCATION: "Where does it hurt?"      Right ear and right jaw.  5. RASH: "Is there any redness, rash, or swelling of the face?"     Denies.  6. FEVER: "Do you have a fever?" If Yes, ask: "What is it, how was it measured, and when did it start?"      Denies.  7. OTHER SYMPTOMS: "Do you have any other symptoms?" (e.g., fever, toothache, nasal discharge, nasal congestion, clicking sensation in jaw joint)     Denies.  Protocols used: Face Pain-A-AH

## 2023-12-19 ENCOUNTER — Encounter: Payer: Self-pay | Admitting: Family Medicine

## 2023-12-19 NOTE — Telephone Encounter (Signed)
OK for jury duty letter.

## 2023-12-30 ENCOUNTER — Other Ambulatory Visit: Payer: Self-pay

## 2023-12-30 MED ORDER — COLESTIPOL HCL 1 G PO TABS: 1.0000 g | ORAL_TABLET | Freq: Two times a day (BID) | ORAL | 1 refills | Status: DC

## 2023-12-30 NOTE — Telephone Encounter (Signed)
Colestipol refilled as pharmacy requested. Hendel is up to date on her office visits.

## 2024-01-10 ENCOUNTER — Other Ambulatory Visit: Payer: Self-pay | Admitting: *Deleted

## 2024-01-10 DIAGNOSIS — M25532 Pain in left wrist: Secondary | ICD-10-CM

## 2024-01-10 MED ORDER — CELECOXIB 200 MG PO CAPS: ORAL_CAPSULE | ORAL | 2 refills | Status: DC

## 2024-01-14 ENCOUNTER — Ambulatory Visit: Payer: Medicare Other | Admitting: Psychology

## 2024-01-15 ENCOUNTER — Encounter: Payer: Self-pay | Admitting: Family Medicine

## 2024-01-15 DIAGNOSIS — G501 Atypical facial pain: Secondary | ICD-10-CM | POA: Diagnosis not present

## 2024-01-15 DIAGNOSIS — G500 Trigeminal neuralgia: Secondary | ICD-10-CM | POA: Diagnosis not present

## 2024-01-15 DIAGNOSIS — Z79899 Other long term (current) drug therapy: Secondary | ICD-10-CM | POA: Diagnosis not present

## 2024-01-15 DIAGNOSIS — G894 Chronic pain syndrome: Secondary | ICD-10-CM | POA: Diagnosis not present

## 2024-01-15 MED ORDER — PREGABALIN 50 MG PO CAPS: 50.0000 mg | ORAL_CAPSULE | Freq: Three times a day (TID) | ORAL | 2 refills | Status: DC

## 2024-01-23 ENCOUNTER — Encounter: Payer: Self-pay | Admitting: Neurology

## 2024-01-23 ENCOUNTER — Other Ambulatory Visit (INDEPENDENT_AMBULATORY_CARE_PROVIDER_SITE_OTHER): Payer: Medicare Other

## 2024-01-23 ENCOUNTER — Ambulatory Visit (INDEPENDENT_AMBULATORY_CARE_PROVIDER_SITE_OTHER): Payer: Medicare Other | Admitting: Sports Medicine

## 2024-01-23 DIAGNOSIS — R202 Paresthesia of skin: Secondary | ICD-10-CM | POA: Diagnosis not present

## 2024-01-23 DIAGNOSIS — M180 Bilateral primary osteoarthritis of first carpometacarpal joints: Secondary | ICD-10-CM | POA: Diagnosis not present

## 2024-01-23 DIAGNOSIS — R200 Anesthesia of skin: Secondary | ICD-10-CM

## 2024-01-23 NOTE — Assessment & Plan Note (Signed)
In addition to the orthopedic discomfort Desiree Wilson is also having symptoms of numbness and tingling of both hands, both feet. She tends to drop things, she had negative Tinel's and Phalen signs on exam today. Unclear etiology though she may have a peripheral type neuropathy. She is also on multiple sedating medications. I would like neurology opinion with both a consult and nerve conduction and EMG of both upper and lower extremities.

## 2024-01-23 NOTE — Progress Notes (Signed)
    Procedures performed today:    Procedure: Real-time Ultrasound Guided injection of the right first [facility] Device: Samsung HS60  Verbal informed consent obtained.  Time-out conducted.  Noted no overlying erythema, induration, or other signs of local infection.  Skin prepped in a sterile fashion.  Local anesthesia: Topical Ethyl chloride.  With sterile technique and under real time ultrasound guidance: Arthritic joint noted, 0.5 cc lidocaine, 0.5 cc kenalog 40 injected easily. Completed without difficulty  Advised to call if fevers/chills, erythema, induration, drainage, or persistent bleeding.  Images permanently stored and available for review in PACS.  Impression: Technically successful ultrasound guided injection.  Independent interpretation of notes and tests performed by another provider:   None.  Brief History, Exam, Impression, and Recommendations:    Numbness and tingling of upper and lower extremities of both sides In addition to the orthopedic discomfort Raynelle Fanning is also having symptoms of numbness and tingling of both hands, both feet. She tends to drop things, she had negative Tinel's and Phalen signs on exam today. Unclear etiology though she may have a peripheral type neuropathy. She is also on multiple sedating medications. I would like neurology opinion with both a consult and nerve conduction and EMG of both upper and lower extremities.    ____________________________________________ Ihor Austin. Benjamin Stain, M.D., ABFM., CAQSM., AME. Primary Care and Sports Medicine [facility]  Adjunct Professor of Family Medicine  [employer]  Restaurant manager, fast food

## 2024-01-27 ENCOUNTER — Encounter: Payer: Self-pay | Admitting: Gastroenterology

## 2024-01-27 DIAGNOSIS — F411 Generalized anxiety disorder: Secondary | ICD-10-CM | POA: Diagnosis not present

## 2024-01-27 DIAGNOSIS — F1290 Cannabis use, unspecified, uncomplicated: Secondary | ICD-10-CM | POA: Diagnosis not present

## 2024-01-27 DIAGNOSIS — F339 Major depressive disorder, recurrent, unspecified: Secondary | ICD-10-CM | POA: Diagnosis not present

## 2024-01-27 DIAGNOSIS — F4310 Post-traumatic stress disorder, unspecified: Secondary | ICD-10-CM | POA: Diagnosis not present

## 2024-02-10 ENCOUNTER — Ambulatory Visit (INDEPENDENT_AMBULATORY_CARE_PROVIDER_SITE_OTHER): Payer: Medicare Other | Admitting: Family Medicine

## 2024-02-10 VITALS — BP 127/84 | HR 76 | Ht 65.0 in | Wt 210.0 lb

## 2024-02-10 DIAGNOSIS — R790 Abnormal level of blood mineral: Secondary | ICD-10-CM

## 2024-02-10 DIAGNOSIS — F3341 Major depressive disorder, recurrent, in partial remission: Secondary | ICD-10-CM

## 2024-02-10 DIAGNOSIS — N182 Chronic kidney disease, stage 2 (mild): Secondary | ICD-10-CM

## 2024-02-10 DIAGNOSIS — F5101 Primary insomnia: Secondary | ICD-10-CM

## 2024-02-10 DIAGNOSIS — I10 Essential (primary) hypertension: Secondary | ICD-10-CM

## 2024-02-10 DIAGNOSIS — E876 Hypokalemia: Secondary | ICD-10-CM

## 2024-02-10 MED ORDER — AMLODIPINE BESYLATE 10 MG PO TABS
10.0000 mg | ORAL_TABLET | Freq: Every day | ORAL | 1 refills | Status: DC
Start: 2024-02-10 — End: 2024-09-15

## 2024-02-10 MED ORDER — TRAZODONE HCL 100 MG PO TABS
ORAL_TABLET | ORAL | 1 refills | Status: DC
Start: 2024-02-10 — End: 2024-09-14

## 2024-02-10 NOTE — Progress Notes (Signed)
Established Patient Office Visit  Subjective  Patient ID: Desiree Wilson, female    DOB: [birth year]  Age: 58 y.o. MRN: [id number]  Chief Complaint  Patient presents with   Depression    HPI  She wanted to let me know that she is working with psychiatry to taper down her clonazepam since she is also on pain medication.  They have decreased her clonazepam down to a half a tab in the morning and a whole in the evening.  She has been working via video visits with a psychiatrist.  They also recently started her on Seroquel to help her sleep.  She is not sure that it is really working great at this point in time.  Also recently saw our sports med doc for some problems with her wrist and thumbs.  She has been noticing some intermittent weakness and easily dropping things.  She does have an appointment in March with neurology for further workup as well.  She is also troubled with some back pain that is been radiating up her spine and down her spine at times.  Really like to get back into water aerobics if she could start feeling a little better.       ROS    Objective:     Ht 5\' 5"  (1.651 m)   Wt 210 lb (95.3 kg)   BMI 34.95 kg/m    Physical Exam Vitals and nursing note reviewed.  Constitutional:      Appearance: Normal appearance.  HENT:     Head: Normocephalic and atraumatic.  Eyes:     Conjunctiva/sclera: Conjunctivae normal.  Cardiovascular:     Rate and Rhythm: Normal rate and regular rhythm.  Pulmonary:     Effort: Pulmonary effort is normal.     Breath sounds: Normal breath sounds.  Skin:    General: Skin is warm and dry.  Neurological:     Mental Status: She is alert.  Psychiatric:        Mood and Affect: Mood normal.      Results for orders placed or performed in visit on 02/10/24  Ferritin  Result Value Ref Range   Ferritin 35 15 - 150 ng/mL  Basic Metabolic Panel (BMET)  Result Value Ref Range   Glucose 90 70 - 99 mg/dL   BUN 20 6 - 24 mg/dL    Creatinine, Ser 4.09 0.57 - 1.00 mg/dL   eGFR 78 >81 XB/JYN/8.29   BUN/Creatinine Ratio 23 9 - 23   Sodium 132 (L) 134 - 144 mmol/L   Potassium 5.0 3.5 - 5.2 mmol/L   Chloride 95 (L) 96 - 106 mmol/L   CO2 23 20 - 29 mmol/L   Calcium 9.3 8.7 - 10.2 mg/dL  CBC with Differential/Platelet  Result Value Ref Range   WBC 7.9 3.4 - 10.8 x10E3/uL   RBC 5.01 3.77 - 5.28 x10E6/uL   Hemoglobin 13.2 11.1 - 15.9 g/dL   Hematocrit 56.2 13.0 - 46.6 %   MCV 82 79 - 97 fL   MCH 26.3 (L) 26.6 - 33.0 pg   MCHC 32.2 31.5 - 35.7 g/dL   RDW 86.5 78.4 - 69.6 %   Platelets 190 150 - 450 x10E3/uL   Neutrophils 62 Not Estab. %   Lymphs 29 Not Estab. %   Monocytes 9 Not Estab. %   Eos 0 Not Estab. %   Basos 0 Not Estab. %   Neutrophils Absolute 4.8 1.4 - 7.0 x10E3/uL   Lymphocytes Absolute 2.3  0.7 - 3.1 x10E3/uL   Monocytes Absolute 0.7 0.1 - 0.9 x10E3/uL   EOS (ABSOLUTE) 0.0 0.0 - 0.4 x10E3/uL   Basophils Absolute 0.0 0.0 - 0.2 x10E3/uL   Immature Granulocytes 0 Not Estab. %   Immature Grans (Abs) 0.0 0.0 - 0.1 x10E3/uL      The 10-year ASCVD risk score (Arnett DK, et al., 2019) is: 2.1%    Assessment & Plan:   Problem List Items Addressed This Visit       Cardiovascular and Mediastinum   Primary hypertension   Doing well overall will refill amlodipine today.  Due for updated labs.      Relevant Medications   amLODipine (NORVASC) 10 MG tablet   Other Relevant Orders   Ferritin (Completed)   Basic Metabolic Panel (BMET) (Completed)   CBC with Differential/Platelet (Completed)     Genitourinary   CKD (chronic kidney disease) stage 2, GFR 60-89 ml/min   Continue to monitor renal function every 6 months.      Relevant Orders   Ferritin (Completed)   Basic Metabolic Panel (BMET) (Completed)   CBC with Differential/Platelet (Completed)     Other   Recurrent major depressive disorder, in partial remission (HCC)   PHQ-9 score of 10 today and GAD-7 score of 12.  Again working with  psychiatry on some medication management and try to find the right combination of meds.      Relevant Medications   traZODone (DESYREL) 100 MG tablet   Primary insomnia   Trazodone  still seems to be helpful so we will refill medication today.      Relevant Medications   traZODone (DESYREL) 100 MG tablet   Other Visit Diagnoses       Hypokalemia    -  Primary   Relevant Orders   Ferritin (Completed)   Basic Metabolic Panel (BMET) (Completed)   CBC with Differential/Platelet (Completed)     Low ferritin       Relevant Orders   Ferritin (Completed)   Basic Metabolic Panel (BMET) (Completed)   CBC with Differential/Platelet (Completed)       Return in about 6 months (around 08/09/2024) for Hypertension, sleep .    Nani Gasser, MD

## 2024-02-11 ENCOUNTER — Ambulatory Visit: Payer: Medicare Other | Admitting: Psychology

## 2024-02-11 ENCOUNTER — Encounter: Payer: Self-pay | Admitting: Family Medicine

## 2024-02-11 DIAGNOSIS — F411 Generalized anxiety disorder: Secondary | ICD-10-CM | POA: Diagnosis not present

## 2024-02-11 DIAGNOSIS — F331 Major depressive disorder, recurrent, moderate: Secondary | ICD-10-CM | POA: Diagnosis not present

## 2024-02-11 LAB — CBC WITH DIFFERENTIAL/PLATELET
Basophils Absolute: 0 10*3/uL (ref 0.0–0.2)
Basos: 0 %
EOS (ABSOLUTE): 0 10*3/uL (ref 0.0–0.4)
Eos: 0 %
Hematocrit: 41 % (ref 34.0–46.6)
Hemoglobin: 13.2 g/dL (ref 11.1–15.9)
Immature Grans (Abs): 0 10*3/uL (ref 0.0–0.1)
Immature Granulocytes: 0 %
Lymphocytes Absolute: 2.3 10*3/uL (ref 0.7–3.1)
Lymphs: 29 %
MCH: 26.3 pg — ABNORMAL LOW (ref 26.6–33.0)
MCHC: 32.2 g/dL (ref 31.5–35.7)
MCV: 82 fL (ref 79–97)
Monocytes Absolute: 0.7 10*3/uL (ref 0.1–0.9)
Monocytes: 9 %
Neutrophils Absolute: 4.8 10*3/uL (ref 1.4–7.0)
Neutrophils: 62 %
Platelets: 190 10*3/uL (ref 150–450)
RBC: 5.01 x10E6/uL (ref 3.77–5.28)
RDW: 14.5 % (ref 11.7–15.4)
WBC: 7.9 10*3/uL (ref 3.4–10.8)

## 2024-02-11 LAB — BASIC METABOLIC PANEL
BUN/Creatinine Ratio: 23 (ref 9–23)
BUN: 20 mg/dL (ref 6–24)
CO2: 23 mmol/L (ref 20–29)
Calcium: 9.3 mg/dL (ref 8.7–10.2)
Chloride: 95 mmol/L — ABNORMAL LOW (ref 96–106)
Creatinine, Ser: 0.87 mg/dL (ref 0.57–1.00)
Glucose: 90 mg/dL (ref 70–99)
Potassium: 5 mmol/L (ref 3.5–5.2)
Sodium: 132 mmol/L — ABNORMAL LOW (ref 134–144)
eGFR: 78 mL/min/{1.73_m2} (ref >59)

## 2024-02-11 LAB — FERRITIN: Ferritin: 35 ng/mL (ref 15–150)

## 2024-02-11 NOTE — Progress Notes (Signed)
[name], kidney function looks better this time compared to 2 months ago when it was up a little bit.  Sodium level is still low.  Blood count looks great.  Ferritin levels are coming up nicely it was 17 2 months ago it is now up to 35 which is 1 to make sure we get it above 40 so please continue with your extra iron that you are taking.  The low sodium could also be coming from the Cymbalta that is one of the side effects.  We could always try adjusting your dose down and seeing if that makes a difference.

## 2024-02-11 NOTE — Progress Notes (Signed)
[facility] Counselor/Therapist Progress Note  Patient ID: CERYS WINGET, MRN: [id number],    Date: 02/11/2024  Time Spent: 9:00am-9:50am   50 minutes   Treatment Type: Individual Therapy  Reported Symptoms: stress, pain  Mental Status Exam: Appearance:  Casual     Behavior: Appropriate  Motor: Normal  Speech/Language:  Normal Rate  Affect: Appropriate  Mood: normal  Thought process: normal  Thought content:   WNL  Sensory/Perceptual disturbances:   WNL  Orientation: oriented to person, place, time/date, and situation  Attention: Good  Concentration: Good  Memory: WNL  Fund of knowledge:  Good  Insight:   Good  Judgment:  Good  Impulse Control: Good   Risk Assessment: Danger to Self:  No Self-injurious Behavior: No Danger to Others: No Duty to Warn:no Physical Aggression / Violence:No  Access to Firearms a concern: No  Gang Involvement:No   Subjective: Pt present for face-to-face individual therapy via video.  Pt consents to telehealth video session and is aware of limitations and benefits of virtual sessions. Location of pt: home Location of therapist: home office.   Pt talked about being in a lot of pain.  She states her whole body is in pain.  She gets her infusion tomorrow.  Pt has even had trouble walking.    Pt saw a psychiatrist at the [facility] a couple of weeks ago.  She was prescribed Serequil which seems to be improving her mood. Pt talked about her relationship with [name].   Pt states things have been good lately.   They have been communicating more and pt feels loved.   Pt talked about her anxiety.   She tends to worry a lot.  Addressed pt's worries.  She worries about finances and her health.  She thinks of worst case scenarios.  Worked on thought reframing.    Worked on self care strategies. Provided supportive therapy.    Interventions: Cognitive Behavioral Therapy and Insight-Oriented  Diagnosis:  F33.1 and  F41.1  Plan: Plan of Care: Recommend ongoing therapy.   Pt participated with setting treatment goals.   Pt states her goals for therapy are to have a place to process her feelings.   Pt wants to improve coping skills.   Plan to meet every month.    Treatment Plan (Treatment Plan Target Date: 2/29/2025) Client Abilities/Strengths  Pt is bright, engaging, and motivated for therapy.  Client Treatment Preferences  Individual therapy.  Client Statement of Needs  Improve copings skills and have a safe place to talk about feelings.   Symptoms  Depressed or irritable mood. Excessive and/or unrealistic worry that is difficult to control occurring more days than not for at least 6 months about a number of events or activities. Hypervigilance (e.g., feeling constantly on edge, experiencing concentration difficulties, having trouble falling or staying asleep, exhibiting a general state of irritability).  Problems Addressed  Unipolar Depression, Anxiety Goals 1. Alleviate depressive symptoms and return to previous level of effective functioning. 2. Appropriately grieve the loss in order to normalize mood and to return to previously adaptive level of functioning. Objective Learn and implement behavioral strategies to overcome depression. Target Date: 2025-02-29 Frequency: Monthly  Progress: 40 Modality: individual  Related Interventions Assist the client in developing skills that increase the likelihood of deriving pleasure from behavioral activation (e.g., assertiveness skills, developing an exercise plan, less internal/more external focus, increased social involvement); reinforce success. Engage the client in "behavioral activation," increasing his/her activity level and contact with  sources of reward, while identifying processes that inhibit activation. use behavioral techniques such as instruction, rehearsal, role-playing, role reversal, as needed, to facilitate activity in the client's daily life;  reinforce success. 3. Develop healthy interpersonal relationships that lead to the alleviation and help prevent the relapse of depression. 4. Develop healthy thinking patterns and beliefs about self, others, and the world that lead to the alleviation and help prevent the relapse of depression. 5. Enhance ability to effectively cope with the full variety of life's worries and anxieties. 6. Learn and implement coping skills that result in a reduction of anxiety and worry, and improved daily functioning. Objective Learn and implement problem-solving strategies for realistically addressing worries. Target Date: 2025-02-29 Frequency: Monthly  Progress: 40 Modality: individual  Related Interventions Assign the client a homework exercise in which he/she problem-solves a current problem (see Mastery of Your Anxiety and Worry: Workbook by Elenora Fender and Filbert Schilder or Generalized Anxiety Disorder by Elesa Hacker, and Filbert Schilder); review, reinforce success, and provide corrective feedback toward improvement. Teach the client problem-solving strategies involving specifically defining a problem, generating options for addressing it, evaluating the pros and cons of each option, selecting and implementing an optional action, and reevaluating and refining the action. Objective Learn and implement calming skills to reduce overall anxiety and manage anxiety symptoms. Target Date: 2025-02-29 Frequency: Monthly  Progress: 40 Modality: individual  Related Interventions Assign the client to read about progressive muscle relaxation and other calming strategies in relevant books or treatment manuals (e.g., Progressive Relaxation Training by Twana First; Mastery of Your Anxiety and Worry: Workbook by Earlie Counts). Assign the client homework each session in which he/she practices relaxation exercises daily, gradually applying them progressively from non-anxiety-provoking to anxiety-provoking situations; review and  reinforce success while providing corrective feedback toward improvement. Teach the client calming/relaxation skills (e.g., applied relaxation, progressive muscle relaxation, cue controlled relaxation; mindful breathing; biofeedback) and how to discriminate better between relaxation and tension; teach the client how to apply these skills to his/her daily life. 7. Recognize, accept, and cope with feelings of depression. 8. Reduce overall frequency, intensity, and duration of the anxiety so that daily functioning is not impaired. 9. Resolve the core conflict that is the source of anxiety. 10. Stabilize anxiety level while increasing ability to function on a daily basis. Diagnosis Axis none F33.1 Major Depressive Disorder  Axis none F41.1 General Anxiety Disorder  Conditions For Discharge Achievement of treatment goals and objectives   Salomon Fick, LCSW

## 2024-02-12 DIAGNOSIS — Z5181 Encounter for therapeutic drug level monitoring: Secondary | ICD-10-CM | POA: Diagnosis not present

## 2024-02-12 DIAGNOSIS — Z79899 Other long term (current) drug therapy: Secondary | ICD-10-CM | POA: Diagnosis not present

## 2024-02-12 DIAGNOSIS — R69 Illness, unspecified: Secondary | ICD-10-CM | POA: Diagnosis not present

## 2024-02-12 NOTE — Assessment & Plan Note (Signed)
Doing well overall will refill amlodipine today.  Due for updated labs.

## 2024-02-12 NOTE — Assessment & Plan Note (Signed)
Trazodone  still seems to be helpful so we will refill medication today.

## 2024-02-12 NOTE — Assessment & Plan Note (Signed)
PHQ-9 score of 10 today and GAD-7 score of 12.  Again working with psychiatry on some medication management and try to find the right combination of meds.

## 2024-02-12 NOTE — Assessment & Plan Note (Signed)
Continue to monitor renal function every 6 months.

## 2024-02-14 DIAGNOSIS — M79671 Pain in right foot: Secondary | ICD-10-CM | POA: Diagnosis not present

## 2024-02-14 DIAGNOSIS — M2042 Other hammer toe(s) (acquired), left foot: Secondary | ICD-10-CM | POA: Diagnosis not present

## 2024-02-14 DIAGNOSIS — M2041 Other hammer toe(s) (acquired), right foot: Secondary | ICD-10-CM | POA: Diagnosis not present

## 2024-02-14 DIAGNOSIS — B351 Tinea unguium: Secondary | ICD-10-CM | POA: Diagnosis not present

## 2024-02-14 DIAGNOSIS — M79672 Pain in left foot: Secondary | ICD-10-CM | POA: Diagnosis not present

## 2024-02-17 DIAGNOSIS — H524 Presbyopia: Secondary | ICD-10-CM | POA: Diagnosis not present

## 2024-02-20 ENCOUNTER — Other Ambulatory Visit: Payer: Self-pay | Admitting: *Deleted

## 2024-02-20 DIAGNOSIS — E7849 Other hyperlipidemia: Secondary | ICD-10-CM

## 2024-02-20 MED ORDER — ROSUVASTATIN CALCIUM 20 MG PO TABS
20.0000 mg | ORAL_TABLET | Freq: Every day | ORAL | 3 refills | Status: DC
Start: 2024-02-20 — End: 2024-09-14

## 2024-02-25 DIAGNOSIS — F4310 Post-traumatic stress disorder, unspecified: Secondary | ICD-10-CM | POA: Diagnosis not present

## 2024-02-25 DIAGNOSIS — F4001 Agoraphobia with panic disorder: Secondary | ICD-10-CM | POA: Diagnosis not present

## 2024-02-25 DIAGNOSIS — F411 Generalized anxiety disorder: Secondary | ICD-10-CM | POA: Diagnosis not present

## 2024-02-25 DIAGNOSIS — F339 Major depressive disorder, recurrent, unspecified: Secondary | ICD-10-CM | POA: Diagnosis not present

## 2024-03-03 ENCOUNTER — Encounter: Payer: Self-pay | Admitting: Neurology

## 2024-03-03 ENCOUNTER — Ambulatory Visit: Payer: Medicare Other | Admitting: Neurology

## 2024-03-03 VITALS — BP 165/95 | HR 76 | Resp 18 | Ht 65.0 in | Wt 205.0 lb

## 2024-03-03 DIAGNOSIS — R202 Paresthesia of skin: Secondary | ICD-10-CM

## 2024-03-03 NOTE — Progress Notes (Signed)
 [facility] Clinic Note - Initial Visit   Date: 03/03/2024   Desiree Wilson MRN: [id number] DOB: May 02, 1966   Dear Dr. Benjamin Stain:  Thank you for your kind referral of Desiree Wilson for consultation of generalized tingling. Although her history is well known to you, please allow [place] to reiterate it for the purpose of our medical record. The patient was accompanied to the clinic by self.    Desiree Wilson is a 58 y.o. right-handed female with major depression, chronic pain, hypertension, right trigeminal neuralgia s/p multiple facial surgery with residual facial nerve paralysis, migraines, hyperlipidemia presenting for evaluation of generalized numbness/tingling.   IMPRESSION/PLAN: Bilateral hand paresthesias and pain. Additional testing with NCS/EMG of the arms will be ordered to localize symptoms.  ?Cervical radiculopathy as symptoms are too diffuse to be explained by entrapment neuropathy.   Bilateral ankle pain and paresthesias.  Low suspicion for neuropathy given normal distal sensation.  NCS/EMG of the legs will be ordered to better characterize her symptoms.   Further recommendations pending results.   ------------------------------------------------------------- History of present illness: Starting in the fall of 2025, she began having sharp pain involving the ankles which occurs when she stand up.  She wears a compressive sleeve on her foot which helps.  She has some numbness around her ankles and pain at the balls of her feet.    She also complains of numbness/tingling involving the right middle finger, index finger, and ring finger.    She also reports intermittent spells of tingling involving the left thumb.  Sometimes, she has sensation of pain shooting out of her left thumb.  She has a long history of right trigeminal neuralgia and has multiple surgeries, which was complicated by right facial paralysis.  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  She is followed by pain management.  She smokes and drinks occasionally.  She is on disability for trigeminal neuralgia since 2003.  She lives at home with partner.  She has a Administrator, [employer].    Out-side paper records, electronic medical record, and images have been reviewed where available and summarized as:  MRI brain wwo contrast 01/20/2020: Sequela of right suboccipital craniectomy, with abnormal enhancement of the expected location of the cisternal portion of the 5th cranial nerve, as well as some signal abnormality in the right side of the pons at this level. These findings may be related to prior procedures/ablations. Comparison with prior imaging studies might be helpful for further understanding.   CT soft tissue neck w contrast 05/02/2020: Redemonstration of right facial and suboccipital postsurgical changes. No suspicious masses or cervical lymphadenopathy.  Lab Results  Component Value Date   HGBA1C 5.6 11/19/2023   Lab Results  Component Value Date   VITAMINB12 685 03/02/2022   Lab Results  Component Value Date   TSH 0.933 05/11/2021   Lab Results  Component Value Date   ESRSEDRATE 20 08/31/2014    Past Medical History:  Diagnosis Date   Allergy    rhinitis   Anemia    Anxiety    Blood transfusion    x2 1983 surgery blood loss   Blood transfusion without reported diagnosis    Depression    Depression    Facial paralysis    right face/ chronic pain syndrome (pain management Dr Kendell Bane)   [id number](725.3)    Hyperlipidemia    Hypotension    Iron deficiency anemia 11/15/2014  Iron malabsorption 11/15/2014   Osteoarthritis    Trigeminal neuralgia    right   Trigeminal neuralgia 2002    Past Surgical History:  Procedure Laterality Date   COLONOSCOPY     ESOPHAGOGASTRODUODENOSCOPY     gamma knife Right 01/2018   MANDIBLE SURGERY     NASAL SINUS SURGERY     OSTEOTOMY     plastic facial  surgery     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     Medications:  Outpatient Encounter Medications as of 03/03/2024  Medication Sig   AMBULATORY NON FORMULARY MEDICATION Ketamine nose spray: use 2 sprays prn pain, up to 10 sprays a day   AMBULATORY NON FORMULARY MEDICATION Medication Name: Align Probiotic   amLODipine (NORVASC) 10 MG tablet Take 1 tablet (10 mg total) by mouth daily.   buprenorphine (BUTRANS) 20 MCG/HR PTWK Place 1 patch onto the skin once a week.   Carboxymethylcellulose Sodium 1 % GEL Apply to eye as needed.   celecoxib (CELEBREX) 200 MG capsule One to 2 tablets by mouth daily as needed for pain.   clonazePAM (KLONOPIN) 1 MG tablet Take 1 mg by mouth 3 (three) times daily as needed for anxiety.   dicyclomine (BENTYL) 10 MG capsule Take 1 capsule (10 mg total) by mouth every 8 (eight) hours as needed for spasms.   DULoxetine (CYMBALTA) 60 MG capsule Take 1 capsule (60 mg total) by mouth 2 (two) times daily.   estradiol (ESTRACE) 0.1 MG/GM vaginal cream Place 1 Applicatorful vaginally at bedtime. X 7 days, then 3 x a week   ferrous sulfate 325 (65 FE) MG tablet Take 325 mg by mouth 2 (two) times daily with a meal.   fluticasone (FLONASE) 50 MCG/ACT nasal spray Place 1 spray into both nostrils daily.   folic acid (FOLVITE) 1 MG tablet Take 1 mg by mouth daily.   frovatriptan (FROVA) 2.5 MG tablet Take 2.5 mg by mouth daily as needed.   ketamine (KETALAR) 10 MG/ML injection Inject into the vein every 3 (three) months.    ketorolac (TORADOL) 10 MG tablet Take 10 mg by mouth as needed.   Misc. Devices MISC Dispense central line dressing kit includes One pair sterile gloves One ChloraPrep  One-Step applicator, 3mL One isopropyl alcohol swabstick One 2" x 2" gauze dressing One 4" x 4" gauze dressing One roll medical tape   Multiple Vitamin (MULTIVITAMIN) capsule Take 1 capsule by mouth daily.   omeprazole (PRILOSEC) 40 MG capsule TAKE 1 CAPSULE BY  MOUTH DAILY   Oxcarbazepine (TRILEPTAL) 300 MG tablet Take 300 mg by mouth 2 (two) times daily.   pregabalin (LYRICA) 50 MG capsule Take 1 capsule (50 mg total) by mouth 3 (three) times daily.   Probiotic Product (PROBIOTIC PO) Take by mouth.   promethazine (PHENERGAN) 25 MG tablet Take 1 tablet (25 mg total) by mouth every 8 (eight) hours as needed for nausea or vomiting.   rosuvastatin (CRESTOR) 20 MG tablet Take 1 tablet (20 mg total) by mouth daily.   tiZANidine (ZANAFLEX) 4 MG tablet Take 4 mg by mouth every 6 (six) hours as needed.   traZODone (DESYREL) 100 MG tablet TAKE 2 TABLETS(200 MG) BY MOUTH AT BEDTIME AS NEEDED FOR SLEEP   Turmeric (QC TUMERIC COMPLEX PO) Take by mouth.   vitamin B-12 (CYANOCOBALAMIN) 1000 MCG tablet Take 1,000 mcg by mouth daily.   vitamin C (ASCORBIC ACID) 500 MG tablet Take 500 mg by mouth daily.  colestipol (COLESTID) 1 g tablet Take 1 tablet (1 g total) by mouth 2 (two) times daily.   No facility-administered encounter medications on file as of 03/03/2024.    Allergies:  Allergies  Allergen Reactions   Azithromycin Rash    REACTION: C Diff Other reaction(s): Other (See Comments), Other (See Comments) REACTION: C Diff REACTION: C Diff REACTION: C Diff    Methadone     Other reaction(s): Hallucinations, Other (See Comments), Other (See Comments) Hallucinations Hallucinations Hallucinations    Tapentadol Anxiety    Other reaction(s): Dizziness   Phenobarbital     REACTION: Excited   Wellbutrin [Bupropion] Other (See Comments)    Not effective.     Family History: Family History  Problem Relation Age of Onset   Breast cancer Mother        lumpectomy and radiation   Obesity Mother    Stroke Father    Diverticulitis Father    Hypertension Father    Hyperlipidemia Father    Depression Father    Alzheimer's disease Father    Colon cancer Neg Hx    Stomach cancer Neg Hx    Esophageal cancer Neg Hx    Pancreatic cancer Neg Hx      Social History: Social History   Tobacco Use   Smoking status: Some Days    Current packs/day: 0.00    Types: Cigarettes    Start date: 12/31/1994    Last attempt to quit: 12/31/2000    Years since quitting: 23.1   Smokeless tobacco: Never   Tobacco comments:    Social smoker   Vaping Use   Vaping status: Never Used  Substance Use Topics   Alcohol use: No    Alcohol/week: 0.0 standard drinks of alcohol   Drug use: No   Social History   Social History Narrative   Wants to start back exercising. Goal is to loose 20 to 40 lbs in the next year. Coffee in the morning and tea during the day   Right handed   Drinks caffeine   Lives with partner   Two floor home    Vital Signs:  BP (!) 165/95   Pulse 76   Resp 18   Ht 5\' 5"  (1.651 m)   Wt 205 lb (93 kg)   SpO2 98%   BMI 34.11 kg/m    Neurological Exam: MENTAL STATUS including orientation to time, place, person, recent and remote memory, attention span and concentration, language, and fund of knowledge is normal.  Speech is not dysarthric.  CRANIAL NERVES: II:  No visual field defects.     III-IV-VI: Pupils equal round and reactive to light.  Normal conjugate, extra-ocular eye movements in all directions of gaze.  No nystagmus.  Right ptosis (old)    V:  Normal facial sensation.    VII:  Right facial paralysis (trace movement of frontalis, buccinator, 3/5 involving the orbicularis oris and oculi. Left facial movements intact.    VIII:  Normal hearing and vestibular function.   IX-X:  Normal palatal movement.   XI:  Normal shoulder shrug and head rotation.   XII:  Normal tongue strength and range of motion, no deviation or fasciculation.  MOTOR:  No atrophy, fasciculations or abnormal movements.  No pronator drift.   Upper Extremity:  Right  Left  Deltoid  5/5   5/5   Biceps  5/5   5/5   Triceps  5/5   5/5   Wrist extensors  5/5   5/5  Wrist flexors  5/5   5/5   Finger extensors  5/5   5/5   Finger flexors  5/5    5/5   Dorsal interossei  5-/5   5-/5   Abductor pollicis  5/5   5/5   Tone (Ashworth scale)  0  0   Lower Extremity:  Right  Left  Hip flexors  5/5   5/5   Knee flexors  5/5   5/5   Knee extensors  5/5   5/5   Dorsiflexors  5/5   5/5   Plantarflexors  5/5   5/5   Toe extensors  5/5   5/5   Toe flexors  5/5   5/5   Tone (Ashworth scale)  0  0   MSRs:                                           Right        Left brachioradialis 2+  2+  biceps 2+  2+  triceps 2+  2+  patellar 2+  2+  ankle jerk 2+  2+  Hoffman no  no  plantar response down  down   SENSORY:  Normal and symmetric perception of light touch, pinprick, vibration, and temperature.  Romberg's sign absent.   COORDINATION/GAIT: Normal finger-to- nose-finger.  Intact rapid alternating movements bilaterally.  Gait narrow based and stable. Tandem and stressed gait intact.     Thank you for allowing me to participate in patient's care.  If I can answer any additional questions, I would be pleased to do so.    Sincerely,    Jilian West K. Allena Katz, DO

## 2024-03-03 NOTE — Patient Instructions (Signed)
 Nerve testing of both arms and both legs  ELECTROMYOGRAM AND NERVE CONDUCTION STUDIES (EMG/NCS) INSTRUCTIONS  How to Prepare The neurologist conducting the EMG will need to know if you have certain medical conditions. Tell the neurologist and other EMG lab personnel if you: Have a pacemaker or any other electrical medical device Take blood-thinning medications Have hemophilia, a blood-clotting disorder that causes prolonged bleeding Bathing Take a shower or bath shortly before your exam in order to remove oils from your skin. Don't apply lotions or creams before the exam.  What to Expect You'll likely be asked to change into a hospital gown for the procedure and lie down on an examination table. The following explanations can help you understand what will happen during the exam.  Electrodes. The neurologist or a technician places surface electrodes at various locations on your skin depending on where you're experiencing symptoms. Or the neurologist may insert needle electrodes at different sites depending on your symptoms.  Sensations. The electrodes will at times transmit a tiny electrical current that you may feel as a twinge or spasm. The needle electrode may cause discomfort or pain that usually ends shortly after the needle is removed. If you are concerned about discomfort or pain, you may want to talk to the neurologist about taking a short break during the exam.  Instructions. During the needle EMG, the neurologist will assess whether there is any spontaneous electrical activity when the muscle is at rest - activity that isn't present in healthy muscle tissue - and the degree of activity when you slightly contract the muscle.  He or she will give you instructions on resting and contracting a muscle at appropriate times. Depending on what muscles and nerves the neurologist is examining, he or she may ask you to change positions during the exam.  After your EMG You may experience some  temporary, minor bruising where the needle electrode was inserted into your muscle. This bruising should fade within several days. If it persists, contact your primary care doctor.

## 2024-03-05 ENCOUNTER — Ambulatory Visit: Payer: Medicare Other | Admitting: Gastroenterology

## 2024-03-05 ENCOUNTER — Encounter (INDEPENDENT_AMBULATORY_CARE_PROVIDER_SITE_OTHER): Payer: Self-pay | Admitting: Sports Medicine

## 2024-03-05 ENCOUNTER — Ambulatory Visit: Payer: Medicare Other | Admitting: Sports Medicine

## 2024-03-05 DIAGNOSIS — M180 Bilateral primary osteoarthritis of first carpometacarpal joints: Secondary | ICD-10-CM

## 2024-03-05 NOTE — Telephone Encounter (Signed)

## 2024-03-05 NOTE — Progress Notes (Deleted)
 HPI :  Seen 47/657:  58 year old female here for a follow-up visit for chronic diarrhea, GERD, B12 deficiency, iron deficiency. She also has a history of trigeminal neuralgia and chronic pain.   Recall we have been following her for history of significant loose stools with urgency, for which she has undergone a workup in recent years. She has tested negative for celiac disease, had a negative infectious work-up, had an EGD and colonoscopy with me in October 2022, results as below.  Tested negative for microscopic colitis and celiac as well as H. pylori.  No clear cause for her vitamin deficiencies.  We have done a capsule endoscopy since her last visit in March of last year, that was normal.   We had discussed options in the past with her.  Placed her on Colestid 1 g twice daily a few years ago.  This really did help reduce her stool frequency and generally she has been doing really well on the regimen and wanted to continue it.  She was also taking Imodium as needed.  I gave her Bentyl at the last visit for abdominal cramps and worsening stools, she does not recall taking it and has not thought she is needed it.  She is taking B12 and iron supplementation.  Her labs look good.   She does take omeprazole 40 mg daily for history of GERD and upset stomach.  She states this was working really well for her.  She has been hesitant to stop this in the past.  As suspected that potentially could be causing her B12 deficiency.  She normally does not take much NSAIDs.  She was given a course of Celebrex for some elbow discomfort.  She has Toradol to use as needed for severe pain for her trigeminal neuralgia but she tries to manage this with other modalities and does not take it much, has been on ketamine infusions for it.   On her current regimen she is having about 4 bowel movements per day.  Stools are soft, they are formed, no loose or watery stools.  She has been constipated in the past and does not like  having that either.  She will use an Imodium if she is out of the house just to make sure she does not have to use the bathroom and that does help her when she takes it.  She is also found that drinking protein supplements if she uses them routinely can cause her to be constipated.  She usually drinks that every other day.  Between these things her bowels remain fairly well-controlled.   Prior workup: Colonoscopy 12/07/2016: - Non-thrombosed external hemorrhoids found on perianal exam. - One 3 mm polyp at the ileocecal valve, removed with a cold biopsy forceps. Resected and retrieved. - One 6 mm polyp at the hepatic flexure, removed with a cold snare. Resected and retrieved. - One 5 mm polyp in the descending colon, removed with a cold snare. Resected and retrieved. - Internal hemorrhoids. - 5 year colonoscopy recall - The examination was otherwise normal. No inflammatory changes - Retroflexion not performed due to small size of rectum. - Biopsies were taken with a cold forceps from the right colon   1. Surgical [P], ileocecal valve, hepatic flexure, descending, polyp (3) -TUBULAR ADENOMAS AND BENIGN COLONIC MUCOSA. -NO HIGH GRADE DYSPLASIA OR MALIGNANCY IDENTIFIED. 2. Surgical [P], random right colon -BENIGN COLONIC MUCOSA WITH NO HISTOPATHOLOGIC ABNORMALITY. -NO EVIDENCE OF LYMPHOCYTIC OR COLLAGENOUS COLITIS, INFLAMMATORY BOWEL DISEASE OR MALIGNANCY  CT abdomen / pelvis without contrast 07/28/21 - IMPRESSION: No acute findings or other significant abnormality.   GI pathogen panel negative Hgb 13.6, WBC 11.4 CRP 4.0 TTG and IgA normal   EGD 10/12/21 -  - A 3 cm hiatal hernia was present. - The exam of the esophagus was otherwise normal. No Barrett's. No focal stenosis / stricture. - A few small sessile polyps were found in the gastric body. Biopsies were taken with a cold forceps for histology as a representative sample to rule out adenomatous change. - Diffuse mildly  erythematous mucosa was found in the gastric antrum. - The exam of the stomach was otherwise normal. - Biopsies were taken with a cold forceps in the gastric body, at the incisura and in the gastric antrum for Helicobacter pylori testing. - Patchy mildly erythematous mucosa was found in the second portion of the duodenum. - The exam of the duodenum was otherwise normal. - Biopsies for histology were taken with a cold forceps in the duodenal bulb and in the second portion of the duodenum for evaluation of celiac disease.   Colonoscopy 10/12/21 - The perianal and digital rectal examinations were normal. - The terminal ileum appeared normal. - Two sessile polyps were found in the ascending colon. The polyps were 3 to 4 mm in size. These polyps were removed with a cold snare. Resection and retrieval were complete. - A single medium-mouthed diverticulum was found in the ascending colon. - Internal hemorrhoids were found during retroflexion. The hemorrhoids were small. - The exam was otherwise without abnormality. Several minutes spent lavaging the colon to achieve adequate views. - Biopsies for histology were taken with a cold forceps from the right colon, left colon and transverse colon for evaluation of microscopic colitis.   1. Surgical [P], small bowel - DUODENAL MUCOSA WITH FOCAL EROSION AND MILD ASSOCIATED INFLAMMATION. - NO FEATURES OF CELIAC SPRUE OR GRANULOMAS. 2. Surgical [P], gastric antrum and gastric body - ANTRAL AND OXYNTIC MUCOSA WITH MILD CHANGES OF REACTIVE GASTROPATHY. [place] NEGATIVE FOR HELICOBACTER PYLORI. - NO INTESTINAL METAPLASIA, DYSPLASIA OR CARCINOMA. 3. Surgical [P], gastric polyps - FUNDIC GLAND POLYP. - NO INTESTINAL METAPLASIA, ADENOMATOUS CHANGE OR CARCINOMA. 4. Surgical [P], colon, ascending, polyp (2) - TUBULAR ADENOMA(S). - NO HIGH GRADE DYSPLASIA OR CARCINOMA. - INFLAMMATORY POLYP. 5. Surgical [P], random sites, left colon - UNREMARKABLE  COLONIC MUCOSA. - NO MICROSCOPIC COLITIS, ACTIVE INFLAMMATION OR CHRONIC CHANGES. [name]     Capsule endoscopy 03/22/22: normal    58 y.o. female here for assessment of the following   1. Chronic diarrhea   2. Iron deficiency anemia, unspecified iron deficiency anemia type   3. Gastroesophageal reflux disease, unspecified whether esophagitis present   4. Long-term current use of proton pump inhibitor therapy     Overall she is really happy with Colestid.  This regimen has worked with her in combination with Imodium as needed and some modifications to her diet.  Her lipid panel shows no problems with triglycerides, she is tolerating the Colestid well.  We will continue that for now.  She will continue to avoid trigger foods, and she does like to drink protein shakes for her nutrition and this also does slow down her bowels which works well for her.  We again reviewed her vitamin deficiencies, she has had an extensive workup for iron deficiency without any concerning findings.  B12 deficiency could be due to PPI.   She will continue omeprazole at present dosing for now.  We  did discuss long-term risk benefits, she understands, she wishes to continue understanding those risks, it is working well for her symptoms.  She should minimize her NSAID use moving forward.  She should follow-up in 1 year or sooner with any problems.     PLAN: - continue colestid 1gm BID - continue immodium PRN - using protein shakes PRN which slow down bowels as well - reassured her of prior workup - B12 deficiency could be due to PPI - continue omeprazole 40mg  / day - counseled on risks / benefits - minimize NSAID use - f/u one year or sooner with issues       Past Medical History:  Diagnosis Date   Allergy    rhinitis   Anemia    Anxiety    Blood transfusion    x2 1983 surgery blood loss   Blood transfusion without reported diagnosis    Depression    Depression    Facial paralysis    right  face/ chronic pain syndrome (pain management Dr Kendell Bane)   [id number](478.2)    Hyperlipidemia    Hypotension    Iron deficiency anemia 11/15/2014   Iron malabsorption 11/15/2014   Osteoarthritis    Trigeminal neuralgia    right   Trigeminal neuralgia 2002     Past Surgical History:  Procedure Laterality Date   COLONOSCOPY     ESOPHAGOGASTRODUODENOSCOPY     gamma knife Right 01/2018   MANDIBLE SURGERY     NASAL SINUS SURGERY     OSTEOTOMY     plastic facial surgery     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   Family History  Problem Relation Age of Onset   Breast cancer Mother        lumpectomy and radiation   Obesity Mother    Stroke Father    Diverticulitis Father    Hypertension Father    Hyperlipidemia Father    Depression Father    Alzheimer's disease Father    Colon cancer Neg Hx    Stomach cancer Neg Hx    Esophageal cancer Neg Hx    Pancreatic cancer Neg Hx    Social History   Tobacco Use   Smoking status: Some Days    Current packs/day: 0.00    Types: Cigarettes    Start date: 12/31/1994    Last attempt to quit: 12/31/2000    Years since quitting: 23.1   Smokeless tobacco: Never   Tobacco comments:    Social smoker   Vaping Use   Vaping status: Never Used  Substance Use Topics   Alcohol use: No    Alcohol/week: 0.0 standard drinks of alcohol   Drug use: No   Current Outpatient Medications  Medication Sig Dispense Refill   AMBULATORY NON FORMULARY MEDICATION Ketamine nose spray: use 2 sprays prn pain, up to 10 sprays a day     AMBULATORY NON FORMULARY MEDICATION Medication Name: Align Probiotic 1 Bottle 11   amLODipine (NORVASC) 10 MG tablet Take 1 tablet (10 mg total) by mouth daily. 90 tablet 1   buprenorphine (BUTRANS) 20 MCG/HR PTWK Place 1 patch onto the skin once a week.     Carboxymethylcellulose Sodium 1 % GEL Apply to eye as needed.     celecoxib (CELEBREX) 200 MG capsule One to 2 tablets by mouth daily as  needed for pain. 60 capsule 2   clonazePAM (KLONOPIN) 1 MG tablet Take 1 mg by mouth 3 (three) times daily as needed for  anxiety.     colestipol (COLESTID) 1 g tablet Take 1 tablet (1 g total) by mouth 2 (two) times daily. 180 tablet 1   dicyclomine (BENTYL) 10 MG capsule Take 1 capsule (10 mg total) by mouth every 8 (eight) hours as needed for spasms. 30 capsule 1   DULoxetine (CYMBALTA) 60 MG capsule Take 1 capsule (60 mg total) by mouth 2 (two) times daily. 180 capsule 1   estradiol (ESTRACE) 0.1 MG/GM vaginal cream Place 1 Applicatorful vaginally at bedtime. X 7 days, then 3 x a week 85 g 3   ferrous sulfate 325 (65 FE) MG tablet Take 325 mg by mouth 2 (two) times daily with a meal.     fluticasone (FLONASE) 50 MCG/ACT nasal spray Place 1 spray into both nostrils daily. 48 g 1   folic acid (FOLVITE) 1 MG tablet Take 1 mg by mouth daily.     frovatriptan (FROVA) 2.5 MG tablet Take 2.5 mg by mouth daily as needed.     ketamine (KETALAR) 10 MG/ML injection Inject into the vein every 3 (three) months.      ketorolac (TORADOL) 10 MG tablet Take 10 mg by mouth as needed.     Misc. Devices MISC Dispense central line dressing kit includes One pair sterile gloves One ChloraPrep  One-Step applicator, 3mL One isopropyl alcohol swabstick One 2" x 2" gauze dressing One 4" x 4" gauze dressing One roll medical tape     Multiple Vitamin (MULTIVITAMIN) capsule Take 1 capsule by mouth daily.     omeprazole (PRILOSEC) 40 MG capsule TAKE 1 CAPSULE BY MOUTH DAILY 90 capsule 3   Oxcarbazepine (TRILEPTAL) 300 MG tablet Take 300 mg by mouth 2 (two) times daily.     pregabalin (LYRICA) 50 MG capsule Take 1 capsule (50 mg total) by mouth 3 (three) times daily. 90 capsule 2   Probiotic Product (PROBIOTIC PO) Take by mouth.     promethazine (PHENERGAN) 25 MG tablet Take 1 tablet (25 mg total) by mouth every 8 (eight) hours as needed for nausea or vomiting. 20 tablet 0   rosuvastatin (CRESTOR) 20 MG tablet Take 1 tablet  (20 mg total) by mouth daily. 90 tablet 3   tiZANidine (ZANAFLEX) 4 MG tablet Take 4 mg by mouth every 6 (six) hours as needed.     traZODone (DESYREL) 100 MG tablet TAKE 2 TABLETS(200 MG) BY MOUTH AT BEDTIME AS NEEDED FOR SLEEP 180 tablet 1   Turmeric (QC TUMERIC COMPLEX PO) Take by mouth.     vitamin B-12 (CYANOCOBALAMIN) 1000 MCG tablet Take 1,000 mcg by mouth daily.     vitamin C (ASCORBIC ACID) 500 MG tablet Take 500 mg by mouth daily.     No current facility-administered medications for this visit.   Allergies  Allergen Reactions   Azithromycin Rash    REACTION: C Diff Other reaction(s): Other (See Comments), Other (See Comments) REACTION: C Diff REACTION: C Diff REACTION: C Diff    Methadone     Other reaction(s): Hallucinations, Other (See Comments), Other (See Comments) Hallucinations Hallucinations Hallucinations    Tapentadol Anxiety    Other reaction(s): Dizziness   Phenobarbital     REACTION: Excited   Wellbutrin [Bupropion] Other (See Comments)    Not effective.      Review of Systems: All systems reviewed and negative except where noted in HPI.    No results found.  Physical Exam: There were no vitals taken for this visit. Constitutional: Pleasant,well-developed, ***female in no acute  distress. HEENT: Normocephalic and atraumatic. Conjunctivae are normal. No scleral icterus. Neck supple.  Cardiovascular: Normal rate, regular rhythm.  Pulmonary/chest: Effort normal and breath sounds normal. No wheezing, rales or rhonchi. Abdominal: Soft, nondistended, nontender. Bowel sounds active throughout. There are no masses palpable. No hepatomegaly. Extremities: no edema Lymphadenopathy: No cervical adenopathy noted. Neurological: Alert and oriented to person place and time. Skin: Skin is warm and dry. No rashes noted. Psychiatric: Normal mood and affect. Behavior is normal.   ASSESSMENT: 58 y.o. female here for assessment of the following  No diagnosis  found.  PLAN:   [employer], *

## 2024-03-10 ENCOUNTER — Ambulatory Visit: Payer: Medicare Other | Admitting: Psychology

## 2024-03-20 ENCOUNTER — Other Ambulatory Visit: Payer: Self-pay

## 2024-03-20 DIAGNOSIS — R202 Paresthesia of skin: Secondary | ICD-10-CM

## 2024-03-24 DIAGNOSIS — F17200 Nicotine dependence, unspecified, uncomplicated: Secondary | ICD-10-CM | POA: Diagnosis not present

## 2024-03-24 DIAGNOSIS — F339 Major depressive disorder, recurrent, unspecified: Secondary | ICD-10-CM | POA: Diagnosis not present

## 2024-03-24 DIAGNOSIS — F4310 Post-traumatic stress disorder, unspecified: Secondary | ICD-10-CM | POA: Diagnosis not present

## 2024-03-24 DIAGNOSIS — F411 Generalized anxiety disorder: Secondary | ICD-10-CM | POA: Diagnosis not present

## 2024-03-25 ENCOUNTER — Other Ambulatory Visit: Payer: Self-pay

## 2024-03-25 DIAGNOSIS — Z79899 Other long term (current) drug therapy: Secondary | ICD-10-CM | POA: Diagnosis not present

## 2024-03-25 DIAGNOSIS — G501 Atypical facial pain: Secondary | ICD-10-CM | POA: Diagnosis not present

## 2024-03-25 DIAGNOSIS — Z79891 Long term (current) use of opiate analgesic: Secondary | ICD-10-CM | POA: Diagnosis not present

## 2024-03-25 DIAGNOSIS — R202 Paresthesia of skin: Secondary | ICD-10-CM

## 2024-03-25 DIAGNOSIS — G500 Trigeminal neuralgia: Secondary | ICD-10-CM | POA: Diagnosis not present

## 2024-03-26 ENCOUNTER — Encounter: Admitting: Neurology

## 2024-03-27 ENCOUNTER — Ambulatory Visit: Admitting: Nurse Practitioner

## 2024-04-02 ENCOUNTER — Encounter: Admitting: Neurology

## 2024-04-03 ENCOUNTER — Telehealth: Admitting: Physician Assistant

## 2024-04-03 ENCOUNTER — Encounter: Payer: Self-pay | Admitting: Physician Assistant

## 2024-04-03 ENCOUNTER — Other Ambulatory Visit: Payer: Self-pay | Admitting: Family Medicine

## 2024-04-03 VITALS — Ht 65.0 in | Wt 205.0 lb

## 2024-04-03 DIAGNOSIS — Z1231 Encounter for screening mammogram for malignant neoplasm of breast: Secondary | ICD-10-CM

## 2024-04-03 DIAGNOSIS — R051 Acute cough: Secondary | ICD-10-CM

## 2024-04-03 DIAGNOSIS — R0602 Shortness of breath: Secondary | ICD-10-CM

## 2024-04-03 DIAGNOSIS — J0100 Acute maxillary sinusitis, unspecified: Secondary | ICD-10-CM

## 2024-04-03 MED ORDER — AMOXICILLIN-POT CLAVULANATE 875-125 MG PO TABS
1.0000 | ORAL_TABLET | Freq: Two times a day (BID) | ORAL | 0 refills | Status: DC
Start: 2024-04-03 — End: 2024-04-27

## 2024-04-03 MED ORDER — FLUTICASONE PROPIONATE 50 MCG/ACT NA SUSP
1.0000 | Freq: Every day | NASAL | 1 refills | Status: DC
Start: 2024-04-03 — End: 2024-04-27

## 2024-04-03 MED ORDER — ALBUTEROL SULFATE HFA 108 (90 BASE) MCG/ACT IN AERS: 2.0000 | INHALATION_SPRAY | Freq: Four times a day (QID) | RESPIRATORY_TRACT | 0 refills | Status: DC | PRN

## 2024-04-03 NOTE — Progress Notes (Signed)
..  Virtual Visit via Video Note  I connected with Desiree Wilson on 04/03/24 at  8:10 AM EDT by a video enabled telemedicine application and verified that I am speaking with the correct person using two identifiers.  Location: Patient: home Provider: clinic  .Marland KitchenParticipating in visit:  Patient: Desiree Wilson Provider: Tandy Gaw PA-C   I discussed the limitations of evaluation and management by telemedicine and the availability of in person appointments. The patient expressed understanding and agreed to proceed.  History of Present Illness: Pt is a 58 yo smoker who calls into the clinic with sinus pressure, congestion, headache, sinus drainage for over a week. She has tried OTC mucinex, tylenol, sudafed with little relief. She does have a productive cough but feels like not "able to get it all up". She does not have albuterol inhaler.     Observations/Objective: No acute distress Productive cough Normal mood and appearance .Marland Kitchen Today's Vitals   04/03/24 0810  Weight: 205 lb (93 kg)  Height: 5\' 5"  (1.651 m)   Body mass index is 34.11 kg/m.    Assessment and Plan: .Marland KitchenAsiana was seen today for cough.  Diagnoses and all orders for this visit:  Acute non-recurrent maxillary sinusitis -     amoxicillin-clavulanate (AUGMENTIN) 875-125 MG tablet; Take 1 tablet by mouth 2 (two) times daily. -     fluticasone (FLONASE) 50 MCG/ACT nasal spray; Place 1 spray into both nostrils daily.  Acute cough -     albuterol (VENTOLIN HFA) 108 (90 Base) MCG/ACT inhaler; Inhale 2 puffs into the lungs every 6 (six) hours as needed.  SOB (shortness of breath) -     albuterol (VENTOLIN HFA) 108 (90 Base) MCG/ACT inhaler; Inhale 2 puffs into the lungs every 6 (six) hours as needed.  Other orders -     Cancel: POCT Influenza A/B -     Cancel: POC COVID-19 -     Cancel: POCT rapid strep A   Start augmentin  Use albuterol inhaler as needed every 2-6 hours Flonase refilled Follow up as needed if  symptoms persist or worsen Encouraged smoking cessation-follow up if would like to discuss ways to stop smoking and help    Follow Up Instructions:    I discussed the assessment and treatment plan with the patient. The patient was provided an opportunity to ask questions and all were answered. The patient agreed with the plan and demonstrated an understanding of the instructions.   The patient was advised to call back or seek an in-person evaluation if the symptoms worsen or if the condition fails to improve as anticipated.    Tandy Gaw, PA-C

## 2024-04-04 ENCOUNTER — Encounter: Payer: Self-pay | Admitting: Physician Assistant

## 2024-04-06 MED ORDER — FLUCONAZOLE 150 MG PO TABS: ORAL_TABLET | ORAL | 0 refills | Status: DC

## 2024-04-14 ENCOUNTER — Encounter: Payer: Self-pay | Admitting: Family Medicine

## 2024-04-14 NOTE — Telephone Encounter (Signed)
 Schedule for nurse visit for possible UTI

## 2024-04-15 ENCOUNTER — Encounter: Payer: Self-pay | Admitting: Family Medicine

## 2024-04-15 ENCOUNTER — Ambulatory Visit (INDEPENDENT_AMBULATORY_CARE_PROVIDER_SITE_OTHER): Admitting: Family Medicine

## 2024-04-15 VITALS — BP 122/85 | HR 87 | Ht 65.0 in | Wt 205.0 lb

## 2024-04-15 DIAGNOSIS — M545 Low back pain, unspecified: Secondary | ICD-10-CM

## 2024-04-15 DIAGNOSIS — R3915 Urgency of urination: Secondary | ICD-10-CM

## 2024-04-15 LAB — POCT URINALYSIS DIP (CLINITEK)
Bilirubin, UA: NEGATIVE
Blood, UA: NEGATIVE
Glucose, UA: NEGATIVE mg/dL
Ketones, POC UA: NEGATIVE mg/dL
Leukocytes, UA: NEGATIVE
Nitrite, UA: NEGATIVE
POC PROTEIN,UA: NEGATIVE
Spec Grav, UA: 1.02 (ref 1.010–1.025)
Urobilinogen, UA: 1 U/dL
pH, UA: 7 (ref 5.0–8.0)

## 2024-04-15 NOTE — Progress Notes (Signed)
Culture sent 

## 2024-04-15 NOTE — Telephone Encounter (Signed)
 Patient seen in office for nurse visit today 04/15/24.

## 2024-04-15 NOTE — Progress Notes (Signed)
 Pt here today for possible UTI. Pt complains of pressure when using the restroom. Denies CP, SOB, headache.                      Pt gave urine sample. Urine negative for UTI. Will send for culture. Dr. Greer Leak made aware of results.

## 2024-04-15 NOTE — Progress Notes (Signed)
 Dysuria -    UA is clear. Will send for culture. Will with results   Urine dipstick shows negative for all components.  Micro exam: not done

## 2024-04-15 NOTE — Addendum Note (Signed)
 Addended by: Loraina Stauffer D on: 04/15/2024 07:20 PM   Modules accepted: Level of Service

## 2024-04-16 ENCOUNTER — Ambulatory Visit: Payer: Medicare Other | Admitting: Psychology

## 2024-04-16 DIAGNOSIS — F411 Generalized anxiety disorder: Secondary | ICD-10-CM | POA: Diagnosis not present

## 2024-04-16 DIAGNOSIS — F331 Major depressive disorder, recurrent, moderate: Secondary | ICD-10-CM | POA: Diagnosis not present

## 2024-04-16 NOTE — Progress Notes (Signed)
  Behavioral Health Counselor Initial Adult Exam  Name: Desiree Wilson Date: 04/16/2024 MRN: [id number] DOB: 08/16/1966 PCP: Agapito Games, MD  Time spent: 9:00am-9:50am    50 minutes  Guardian/Payee:  Donnamarie Poag requested: No   Reason for Visit /Presenting Problem: Pt present for face-to-face initial assessment update via video.  Pt consents to telehealth video session and is aware of limitations and benefits of virtual sessions.  Location of pt: home Location of therapist: home office.  Pt has trigeminal neuralgia and suffers with chronic pain.    Pt lives with chronic pain daily.  Her pain stays at a 6 or 7 on the pain scale.   She is treated at a pain clinic but at times it is hard to access care.   Pt states she continues to have depression and anxiety and chronic pain.   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  Pt has been medicated with anti depressants since then.  Pt has had to have reconstructive surgeries.   Reviewed pt's treatment plan for annual update.  Updated pt's treatment plan and IA.  Pt participated in setting treatment goals.  Pt continues to need therapy to improve coping skills and have a safe place to talk about her pain and life stressors.  Plan to meet monthly.    Mental Status Exam: Appearance:   Casual     Behavior:  Appropriate  Motor:  Normal  Speech/Language:   Normal Rate  Affect:  Appropriate  Mood:  normal  Thought process:  normal  Thought content:    WNL  Sensory/Perceptual disturbances:    WNL  Orientation:  oriented to person, place, time/date, and situation  Attention:  Good  Concentration:  Good  Memory:  WNL  Fund of knowledge:   Good  Insight:    Good  Judgment:   Good  Impulse Control:  Good    Reported Symptoms:  sadness, pain  Risk Assessment: Danger to Self:  No Self-injurious Behavior: No Danger to Others: No Duty to  Warn:no Physical Aggression / Violence:No  Access to Firearms a concern: No  Gang Involvement:No  Patient / guardian was educated about steps to take if suicide or homicide risk level increases between visits: n/a While future psychiatric events cannot be accurately predicted, the patient does not currently require acute inpatient psychiatric care and does not currently meet [facility] involuntary commitment criteria.  Substance Abuse History: Current substance abuse: No     Past Psychiatric History:   Previous psychological history is significant for anxiety and depression Outpatient Providers:several outpt therapists throughout the years.   History of Psych Hospitalization: No  Psychological Testing:  n/a    Abuse History:  Victim of: No.,  n/a    Report needed: No. Victim of Neglect:No. Perpetrator of  n/a   Witness / Exposure to Domestic Violence: No   Protective Services Involvement: No  Witness to [employer] Violence:  No   Family History:  Family History  Problem Relation Age of Onset   Breast cancer Mother        lumpectomy and radiation   Obesity Mother    Stroke Father    Diverticulitis Father    Hypertension Father    Hyperlipidemia Father    Depression Father    Alzheimer's disease Father    Colon cancer Neg Hx    Stomach cancer Neg Hx  Esophageal cancer Neg Hx    Pancreatic cancer Neg Hx     Living situation: the patient lives with her partner.   Pt has been with her partner [name] for 12 years.   Pt has a service dog named Bo.  Pt was raised with both parents.  She has a younger brother.  Pt's father worked as a Adult nurse.  Pt's mother was a substance abuse Veterinary surgeon.   They did well financially.  Family history of mental health issues:  pt's father has chronic depression.   No family history of substance abuse of child abuse.    Pt states she had a good childhood.   Pt's parents now live at [facility], a continuous level of care community.   Pt's father is in the early stages of alzheimers.  Pt's father is 16 and her mother is 56.   Sexual Orientation: Straight  Relationship Status: single  Name of spouse / other:n/a If a parent, number of children / ages:none  Support Systems: significant other friends  Financial Stress:  Yes   Income/Employment/Disability: Employment.  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  Pt works about 7-10 hours a week as a supplement to her disability income.   Military Service: No   Educational History: Education: post Engineer, maintenance (IT) work or degree  Religion/Sprituality/World View: Agnostic  Any cultural differences that may affect / interfere with treatment:  not applicable   Recreation/Hobbies: pt likes to nap, spending time with her dog Bo.  Pt likes spending time with [name].  Pt has several friends that she facetimes with in the morning.  Pt tends to stay home a lot.    Stressors: Health problems    Strengths: Supportive Relationships, Hopefulness, Self Advocate, and Able to Communicate Effectively  Barriers:  none   Legal History: Pending legal issue / charges: The patient has no significant history of legal issues. History of legal issue / charges:  n/a  Medical History/Surgical History: reviewed Past Medical History:  Diagnosis Date   Allergy    rhinitis   Anemia    Anxiety    Blood transfusion    x2 1983 surgery blood loss   Blood transfusion without reported diagnosis    Depression    Depression    Facial paralysis    right face/ chronic pain syndrome (pain management Dr Kendell Bane)   [id number](478.2)    Hyperlipidemia    Hypotension    Iron deficiency anemia 11/15/2014   Iron malabsorption 11/15/2014   Osteoarthritis    Trigeminal neuralgia    right   Trigeminal neuralgia 2002    Past Surgical History:  Procedure Laterality Date   COLONOSCOPY     ESOPHAGOGASTRODUODENOSCOPY     gamma knife  Right 01/2018   MANDIBLE SURGERY     NASAL SINUS SURGERY     OSTEOTOMY     plastic facial surgery     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 18 g 0   AMBULATORY NON FORMULARY MEDICATION Ketamine nose spray: use 2 sprays prn pain, up to 10 sprays a day     AMBULATORY NON FORMULARY MEDICATION Medication Name: Align Probiotic 1 Bottle 11   amLODipine (NORVASC) 10 MG tablet Take 1 tablet (  10 mg total) by mouth daily. 90 tablet 1   amoxicillin-clavulanate (AUGMENTIN) 875-125 MG tablet Take 1 tablet by mouth 2 (two) times daily. 20 tablet 0   buprenorphine (BUTRANS) 20 MCG/HR PTWK Place 1 patch onto the skin once a week.     Carboxymethylcellulose Sodium 1 % GEL Apply to eye as needed.     celecoxib (CELEBREX) 200 MG capsule One to 2 tablets by mouth daily as needed for pain. 60 capsule 2   clonazePAM (KLONOPIN) 1 MG tablet Take 1 mg by mouth 3 (three) times daily as needed for anxiety.     colestipol (COLESTID) 1 g tablet Take 1 tablet (1 g total) by mouth 2 (two) times daily. 180 tablet 1   dicyclomine (BENTYL) 10 MG capsule Take 1 capsule (10 mg total) by mouth every 8 (eight) hours as needed for spasms. 30 capsule 1   DULoxetine (CYMBALTA) 60 MG capsule Take 1 capsule (60 mg total) by mouth 2 (two) times daily. 180 capsule 1   estradiol (ESTRACE) 0.1 MG/GM vaginal cream Place 1 Applicatorful vaginally at bedtime. X 7 days, then 3 x a week 85 g 3   ferrous sulfate 325 (65 FE) MG tablet Take 325 mg by mouth 2 (two) times daily with a meal.     fluconazole (DIFLUCAN) 150 MG tablet Take one tablet after finishing antibiotics and if needed 48-72 hours later. 2 tablet 0   fluticasone (FLONASE) 50 MCG/ACT nasal spray Place 1 spray into both nostrils daily. 48 g 1   folic  acid (FOLVITE) 1 MG tablet Take 1 mg by mouth daily.     frovatriptan (FROVA) 2.5 MG tablet Take 2.5 mg by mouth daily as needed.     ketamine (KETALAR) 10 MG/ML injection Inject into the vein every 3 (three) months.      ketorolac (TORADOL) 10 MG tablet Take 10 mg by mouth as needed.     Misc. Devices MISC Dispense central line dressing kit includes One pair sterile gloves One ChloraPrep  One-Step applicator, 3mL One isopropyl alcohol swabstick One 2" x 2" gauze dressing One 4" x 4" gauze dressing One roll medical tape     Multiple Vitamin (MULTIVITAMIN) capsule Take 1 capsule by mouth daily.     omeprazole (PRILOSEC) 40 MG capsule TAKE 1 CAPSULE BY MOUTH DAILY 90 capsule 3   Oxcarbazepine (TRILEPTAL) 300 MG tablet Take 300 mg by mouth 2 (two) times daily.     pregabalin (LYRICA) 50 MG capsule Take 1 capsule (50 mg total) by mouth 3 (three) times daily. 90 capsule 2   Probiotic Product (PROBIOTIC PO) Take by mouth.     promethazine (PHENERGAN) 25 MG tablet Take 1 tablet (25 mg total) by mouth every 8 (eight) hours as needed for nausea or vomiting. 20 tablet 0   rosuvastatin (CRESTOR) 20 MG tablet Take 1 tablet (20 mg total) by mouth daily. 90 tablet 3   tiZANidine (ZANAFLEX) 4 MG tablet Take 4 mg by mouth every 6 (six) hours as needed.     traZODone (DESYREL) 100 MG tablet TAKE 2 TABLETS(200 MG) BY MOUTH AT BEDTIME AS NEEDED FOR SLEEP 180 tablet 1   Turmeric (QC TUMERIC COMPLEX PO) Take by mouth.     vitamin B-12 (CYANOCOBALAMIN) 1000 MCG tablet Take 1,000 mcg by mouth daily.     vitamin C (ASCORBIC ACID) 500 MG tablet Take 500 mg by mouth daily.     No current facility-administered medications for this visit.    Allergies  Allergen Reactions   Azithromycin Rash    REACTION: C Diff Other reaction(s): Other (See Comments), Other (See Comments) REACTION: C Diff REACTION: C Diff REACTION: C Diff    Methadone     Other reaction(s): Hallucinations, Other (See Comments), Other (See  Comments) Hallucinations Hallucinations Hallucinations    Tapentadol Anxiety    Other reaction(s): Dizziness   Phenobarbital     REACTION: Excited   Wellbutrin [Bupropion] Other (See Comments)    Not effective.     Diagnoses: F33.1  Plan of Care: Recommend ongoing therapy.   Pt participated with setting treatment goals.   Pt states her goals for therapy are to have a place to process her feelings.   Pt wants to improve coping skills.  Plan to meet every month.  Pt agrees with treatment plan.  Treatment Plan (Treatment Plan Target Date: 04/16/2025) Client Abilities/Strengths  Pt is bright, engaging, and motivated for therapy.  Client Treatment Preferences  Individual therapy.  Client Statement of Needs  Improve copings skills and have a safe place to talk about feelings.   Symptoms  Depressed or irritable mood. Excessive and/or unrealistic worry that is difficult to control occurring more days than not for at least 6 months about a number of events or activities. Hypervigilance (e.g., feeling constantly on edge, experiencing concentration difficulties, having trouble falling or staying asleep, exhibiting a general state of irritability).  Problems Addressed  Unipolar Depression, Anxiety Goals 1. Alleviate depressive symptoms and return to previous level of effective functioning. 2. Appropriately grieve the loss in order to normalize mood and to return to previously adaptive level of functioning. Objective Learn and implement behavioral strategies to overcome depression. Target Date: 2025-04-16 Frequency: Monthly  Progress: 45 Modality: individual  Related Interventions Assist the client in developing skills that increase the likelihood of deriving pleasure from behavioral activation (e.g., assertiveness skills, developing an exercise plan, less internal/more external focus, increased social involvement); reinforce success. Engage the client in "behavioral activation," increasing  his/her activity level and contact with sources of reward, while identifying processes that inhibit activation. use behavioral techniques such as instruction, rehearsal, role-playing, role reversal, as needed, to facilitate activity in the client's daily life; reinforce success. 3. Develop healthy interpersonal relationships that lead to the alleviation and help prevent the relapse of depression. 4. Develop healthy thinking patterns and beliefs about self, others, and the world that lead to the alleviation and help prevent the relapse of depression. 5. Enhance ability to effectively cope with the full variety of life's worries and anxieties. 6. Learn and implement coping skills that result in a reduction of anxiety and worry, and improved daily functioning. Objective Learn and implement problem-solving strategies for realistically addressing worries. Target Date: 2025-04-16 Frequency: Monthly  Progress: 45 Modality: individual  Related Interventions Assign the client a homework exercise in which he/she problem-solves a current problem (see Mastery of Your Anxiety and Worry: Workbook by Elenora Fender and Filbert Schilder or Generalized Anxiety Disorder by Elesa Hacker, and Filbert Schilder); review, reinforce success, and provide corrective feedback toward improvement. Teach the client problem-solving strategies involving specifically defining a problem, generating options for addressing it, evaluating the pros and cons of each option, selecting and implementing an optional action, and reevaluating and refining the action. Objective Learn and implement calming skills to reduce overall anxiety and manage anxiety symptoms. Target Date: 2025-04-16 Frequency: Monthly  Progress: 45 Modality: individual  Related Interventions Assign the client to read about progressive muscle relaxation and other calming strategies in relevant books or treatment manuals (  e.g., Progressive Relaxation Training by Juleen Oakland; Mastery of  Your Anxiety and Worry: Workbook by Rodney Clamp). Assign the client homework each session in which he/she practices relaxation exercises daily, gradually applying them progressively from non-anxiety-provoking to anxiety-provoking situations; review and reinforce success while providing corrective feedback toward improvement. Teach the client calming/relaxation skills (e.g., applied relaxation, progressive muscle relaxation, cue controlled relaxation; mindful breathing; biofeedback) and how to discriminate better between relaxation and tension; teach the client how to apply these skills to his/her daily life. 7. Recognize, accept, and cope with feelings of depression. 8. Reduce overall frequency, intensity, and duration of the anxiety so that daily functioning is not impaired. 9. Resolve the core conflict that is the source of anxiety. 10. Stabilize anxiety level while increasing ability to function on a daily basis. Diagnosis Axis none F33.1 Major Depressive Disorder  Axis none F41.1 General Anxiety Disorder  Conditions For Discharge Achievement of treatment goals and objectives    Willey Harrier, LCSW

## 2024-04-17 DIAGNOSIS — M542 Cervicalgia: Secondary | ICD-10-CM | POA: Diagnosis not present

## 2024-04-17 DIAGNOSIS — Z79899 Other long term (current) drug therapy: Secondary | ICD-10-CM | POA: Diagnosis not present

## 2024-04-17 DIAGNOSIS — G894 Chronic pain syndrome: Secondary | ICD-10-CM | POA: Diagnosis not present

## 2024-04-17 DIAGNOSIS — Z79891 Long term (current) use of opiate analgesic: Secondary | ICD-10-CM | POA: Diagnosis not present

## 2024-04-17 LAB — URINE CULTURE: Organism ID, Bacteria: NO GROWTH

## 2024-04-17 NOTE — Progress Notes (Signed)
 HI Desiree Wilson, you urine culture is negative No UTI. I hope you are feeling some better

## 2024-04-19 DIAGNOSIS — M542 Cervicalgia: Secondary | ICD-10-CM | POA: Diagnosis not present

## 2024-04-19 DIAGNOSIS — G894 Chronic pain syndrome: Secondary | ICD-10-CM | POA: Diagnosis not present

## 2024-04-19 DIAGNOSIS — Z79891 Long term (current) use of opiate analgesic: Secondary | ICD-10-CM | POA: Diagnosis not present

## 2024-04-19 DIAGNOSIS — Z79899 Other long term (current) drug therapy: Secondary | ICD-10-CM | POA: Diagnosis not present

## 2024-04-21 DIAGNOSIS — F411 Generalized anxiety disorder: Secondary | ICD-10-CM | POA: Diagnosis not present

## 2024-04-21 DIAGNOSIS — F4310 Post-traumatic stress disorder, unspecified: Secondary | ICD-10-CM | POA: Diagnosis not present

## 2024-04-21 DIAGNOSIS — F339 Major depressive disorder, recurrent, unspecified: Secondary | ICD-10-CM | POA: Diagnosis not present

## 2024-04-21 DIAGNOSIS — F1290 Cannabis use, unspecified, uncomplicated: Secondary | ICD-10-CM | POA: Diagnosis not present

## 2024-04-27 ENCOUNTER — Encounter: Payer: Self-pay | Admitting: Family Medicine

## 2024-04-27 ENCOUNTER — Ambulatory Visit (INDEPENDENT_AMBULATORY_CARE_PROVIDER_SITE_OTHER): Payer: Medicare Other | Admitting: Family Medicine

## 2024-04-27 VITALS — BP 128/78 | HR 66 | Ht 65.0 in | Wt 213.0 lb

## 2024-04-27 DIAGNOSIS — N182 Chronic kidney disease, stage 2 (mild): Secondary | ICD-10-CM | POA: Diagnosis not present

## 2024-04-27 DIAGNOSIS — F331 Major depressive disorder, recurrent, moderate: Secondary | ICD-10-CM

## 2024-04-27 DIAGNOSIS — R7301 Impaired fasting glucose: Secondary | ICD-10-CM | POA: Diagnosis not present

## 2024-04-27 DIAGNOSIS — R790 Abnormal level of blood mineral: Secondary | ICD-10-CM | POA: Diagnosis not present

## 2024-04-27 DIAGNOSIS — R051 Acute cough: Secondary | ICD-10-CM

## 2024-04-27 DIAGNOSIS — F325 Major depressive disorder, single episode, in full remission: Secondary | ICD-10-CM

## 2024-04-27 DIAGNOSIS — E871 Hypo-osmolality and hyponatremia: Secondary | ICD-10-CM

## 2024-04-27 DIAGNOSIS — R0602 Shortness of breath: Secondary | ICD-10-CM | POA: Diagnosis not present

## 2024-04-27 DIAGNOSIS — I10 Essential (primary) hypertension: Secondary | ICD-10-CM

## 2024-04-27 DIAGNOSIS — J0100 Acute maxillary sinusitis, unspecified: Secondary | ICD-10-CM

## 2024-04-27 MED ORDER — OMEPRAZOLE 40 MG PO CPDR: DELAYED_RELEASE_CAPSULE | ORAL | 3 refills | Status: AC

## 2024-04-27 MED ORDER — FLUTICASONE PROPIONATE 50 MCG/ACT NA SUSP: 1.0000 | Freq: Every day | NASAL | 1 refills | Status: DC

## 2024-04-27 MED ORDER — ALBUTEROL SULFATE HFA 108 (90 BASE) MCG/ACT IN AERS: 2.0000 | INHALATION_SPRAY | Freq: Four times a day (QID) | RESPIRATORY_TRACT | 0 refills | Status: AC | PRN

## 2024-04-27 MED ORDER — DULOXETINE HCL 60 MG PO CPEP
60.0000 mg | ORAL_CAPSULE | Freq: Two times a day (BID) | ORAL | 1 refills | Status: DC
Start: 2024-04-27 — End: 2024-10-21

## 2024-04-27 NOTE — Assessment & Plan Note (Signed)
 Due to check A1c.  It was down to 5.6 back in November which is great.

## 2024-04-27 NOTE — Progress Notes (Signed)
 Established Patient Office Visit  Subjective  Patient ID: Desiree Wilson, female    DOB: Jun 03, 1966  Age: 58 y.o. MRN: [id number]  Chief Complaint  Patient presents with   Hypertension    HPI  6 mo f/u HTN/IFG - doing OK overall  In regards to her trigeminal neuralgia-her ketamine infusions are really only lasting about 6 weeks now and she is only able to get them every 3 months.  So she has decided to go ahead and move forward with surgery at [facility].  She is meeting with them again later this month.  She just is to the point where she is tired of taking all this medication which she feels is causing some weight gain and would like to at least try to get some pain relief.  Just feels like it really keeps her from living her life she has days where she is bedbound.  Her father has Alzheimer's and so she would really like to be able to help her mother out but just cannot commit.  She still dealing with a lot of mucus in her right nasal cavity it tends to get tight and swollen on that side she needs a refill on the albuterol  as well.  Has been gaining weight and not doing well.     ROS    Objective:     BP 128/78 (BP Location: Left Arm, Cuff Size: Normal)   Pulse 66   Ht 5\' 5"  (1.651 m)   Wt 213 lb (96.6 kg)   SpO2 100%   BMI 35.45 kg/m    Physical Exam Vitals and nursing note reviewed.  Constitutional:      Appearance: Normal appearance.  HENT:     Head: Normocephalic and atraumatic.  Eyes:     Conjunctiva/sclera: Conjunctivae normal.  Cardiovascular:     Rate and Rhythm: Normal rate and regular rhythm.  Pulmonary:     Effort: Pulmonary effort is normal.     Breath sounds: Normal breath sounds.  Skin:    General: Skin is warm and dry.  Neurological:     Mental Status: She is alert.  Psychiatric:        Mood and Affect: Mood normal.      No results found for any visits on 04/27/24.    The 10-year ASCVD risk score (Arnett DK, et al., 2019) is: 5.4%     Assessment & Plan:   Problem List Items Addressed This Visit       Cardiovascular and Mediastinum   Primary hypertension - Primary   To recheck BP at next office visit.        Endocrine   IFG (impaired fasting glucose)   Due to check A1c.  It was down to 5.6 back in November which is great.      Relevant Orders   Hemoglobin A1c     Genitourinary   CKD (chronic kidney disease) stage 2, GFR 60-89 ml/min   Relevant Orders   Fe+TIBC+Fer   Basic Metabolic Panel (BMET)     Other   Moderate episode of recurrent major depressive disorder (HCC)   On Cymbalta , Ketamine infusions, butrans patches.       Relevant Medications   DULoxetine  (CYMBALTA ) 60 MG capsule   Depression   She is now seeing psychiatry.        Relevant Medications   DULoxetine  (CYMBALTA ) 60 MG capsule   Other Visit Diagnoses       Low ferritin  Relevant Orders   Fe+TIBC+Fer   Basic Metabolic Panel (BMET)     Hyponatremia       Relevant Orders   Fe+TIBC+Fer   Basic Metabolic Panel (BMET)     Acute cough       Relevant Medications   albuterol  (VENTOLIN  HFA) 108 (90 Base) MCG/ACT inhaler     SOB (shortness of breath)       Relevant Medications   albuterol  (VENTOLIN  HFA) 108 (90 Base) MCG/ACT inhaler     Acute non-recurrent maxillary sinusitis       Relevant Medications   fluticasone  (FLONASE ) 50 MCG/ACT nasal spray       No follow-ups on file.    Duaine German, MD

## 2024-04-27 NOTE — Assessment & Plan Note (Signed)
 She is now seeing psychiatry.

## 2024-04-27 NOTE — Assessment & Plan Note (Signed)
 To recheck BP at next office visit.

## 2024-04-27 NOTE — Assessment & Plan Note (Signed)
 On Cymbalta , Ketamine infusions, butrans patches.

## 2024-04-28 ENCOUNTER — Encounter: Payer: Self-pay | Admitting: Family Medicine

## 2024-04-28 LAB — IRON,TIBC AND FERRITIN PANEL
Ferritin: 67 ng/mL (ref 15–150)
Iron Saturation: 32 % (ref 15–55)
Iron: 94 ug/dL (ref 27–159)
Total Iron Binding Capacity: 290 ug/dL (ref 250–450)
UIBC: 196 ug/dL (ref 131–425)

## 2024-04-28 LAB — HEMOGLOBIN A1C
Est. average glucose Bld gHb Est-mCnc: 108 mg/dL
Hgb A1c MFr Bld: 5.4 % (ref 4.8–5.6)

## 2024-04-28 LAB — BASIC METABOLIC PANEL WITH GFR
BUN/Creatinine Ratio: 14 (ref 9–23)
BUN: 11 mg/dL (ref 6–24)
CO2: 23 mmol/L (ref 20–29)
Calcium: 9.2 mg/dL (ref 8.7–10.2)
Chloride: 93 mmol/L — ABNORMAL LOW (ref 96–106)
Creatinine, Ser: 0.8 mg/dL (ref 0.57–1.00)
Glucose: 93 mg/dL (ref 70–99)
Potassium: 4.8 mmol/L (ref 3.5–5.2)
Sodium: 128 mmol/L — ABNORMAL LOW (ref 134–144)
eGFR: 86 mL/min/{1.73_m2} (ref >59)

## 2024-04-28 NOTE — Progress Notes (Signed)
 Hi Desiree Wilson, your sodium is actually even lower last time it was just borderline.  I think we really are going to have to decrease your duloxetine .  I think you are taking 2 tabs daily we will have to decrease that down to 1 tab and then recheck your sodium level in 3 to 4 weeks.  Iron levels look okay.  A1c looks great.  No diabetes.  Please schedule your Medicare wellness visit with our Medicare wellness nurse when you get a chance.  You can opt for a telephone visit if you would like.

## 2024-04-30 ENCOUNTER — Other Ambulatory Visit: Payer: Self-pay | Admitting: Family Medicine

## 2024-05-06 ENCOUNTER — Ambulatory Visit

## 2024-05-06 DIAGNOSIS — Z5181 Encounter for therapeutic drug level monitoring: Secondary | ICD-10-CM | POA: Diagnosis not present

## 2024-05-06 DIAGNOSIS — Z79899 Other long term (current) drug therapy: Secondary | ICD-10-CM | POA: Diagnosis not present

## 2024-05-07 ENCOUNTER — Encounter (HOSPITAL_COMMUNITY): Payer: Self-pay

## 2024-05-14 ENCOUNTER — Ambulatory Visit

## 2024-05-14 ENCOUNTER — Ambulatory Visit (INDEPENDENT_AMBULATORY_CARE_PROVIDER_SITE_OTHER): Admitting: Psychology

## 2024-05-14 VITALS — Ht 65.0 in | Wt 210.0 lb

## 2024-05-14 DIAGNOSIS — F331 Major depressive disorder, recurrent, moderate: Secondary | ICD-10-CM

## 2024-05-14 DIAGNOSIS — F411 Generalized anxiety disorder: Secondary | ICD-10-CM | POA: Diagnosis not present

## 2024-05-14 DIAGNOSIS — Z0000 Encounter for general adult medical examination without abnormal findings: Secondary | ICD-10-CM

## 2024-05-14 NOTE — Progress Notes (Signed)
 Subjective:   Desiree Wilson is a 58 y.o. female who presents for Medicare Annual (Subsequent) preventive examination.  Visit Complete: Virtual I connected with  Desiree Wilson on 05/14/24 by a audio enabled telemedicine application and verified that I am speaking with the correct person using two identifiers.  Patient Location: Home  Provider Location: Office/Clinic  I discussed the limitations of evaluation and management by telemedicine. The patient expressed understanding and agreed to proceed.  Vital Signs: Because this visit was a virtual/telehealth visit, some criteria may be missing or patient reported. Any vitals not documented were not able to be obtained and vitals that have been documented are patient reported.  Patient Medicare AWV questionnaire was completed by the patient on n/a; I have confirmed that all information answered by patient is correct and no changes since this date.  Cardiac Risk Factors include: dyslipidemia;obesity (BMI >30kg/m2);hypertension;smoking/ tobacco exposure     Objective:    Today's Vitals   05/14/24 1506 05/14/24 1507  Weight: 210 lb (95.3 kg)   Height: 5\' 5"  (1.651 m)   PainSc:  9    Body mass index is 34.95 kg/m.     05/14/2024    3:25 PM 03/03/2024    9:50 AM 08/16/2023    2:48 PM 10/27/2018    1:57 PM 05/16/2018   10:03 PM 01/22/2018    2:37 PM 02/24/2016    1:05 PM  Advanced Directives  Does Patient Have a Medical Advance Directive? Yes Yes No Yes No Yes Yes  Type of Estate agent of [place];Living will Healthcare Power of [employer] of [place];Living will     Does patient want to make changes to medical advance directive? No - Patient declined No - Patient declined  No - Patient declined  No - Patient declined   Copy of Healthcare Power of Attorney in Chart? No - copy requested No - copy requested  No - copy requested     Would patient like information on creating a medical advance  directive?   No - Patient declined        Current Medications (verified) Outpatient Encounter Medications as of 05/14/2024  Medication Sig   albuterol  (VENTOLIN  HFA) 108 (90 Base) MCG/ACT inhaler Inhale 2 puffs into the lungs every 6 (six) hours as needed.   AMBULATORY NON FORMULARY MEDICATION Ketamine nose spray: use 2 sprays prn pain, up to 10 sprays a day   AMBULATORY NON FORMULARY MEDICATION Medication Name: Align Probiotic   amLODipine  (NORVASC ) 10 MG tablet Take 1 tablet (10 mg total) by mouth daily.   buprenorphine (BUTRANS) 20 MCG/HR PTWK Place 1 patch onto the skin once a week.   Carboxymethylcellulose Sodium 1 % GEL Apply to eye as needed.   celecoxib  (CELEBREX ) 200 MG capsule One to 2 tablets by mouth daily as needed for pain.   clonazePAM  (KLONOPIN ) 1 MG tablet Take 1 mg by mouth 3 (three) times daily as needed for anxiety.   colestipol  (COLESTID ) 1 g tablet Take 1 tablet (1 g total) by mouth 2 (two) times daily.   dicyclomine  (BENTYL ) 10 MG capsule Take 1 capsule (10 mg total) by mouth every 8 (eight) hours as needed for spasms.   DULoxetine  (CYMBALTA ) 60 MG capsule Take 1 capsule (60 mg total) by mouth 2 (two) times daily. (Patient taking differently: Take 60 mg by mouth daily.)   estradiol  (ESTRACE ) 0.1 MG/GM vaginal cream Place 1 Applicatorful vaginally at bedtime. X 7 days, then 3 x  a week   ferrous sulfate 325 (65 FE) MG tablet Take 325 mg by mouth 2 (two) times daily with a meal.   fluticasone  (FLONASE ) 50 MCG/ACT nasal spray Place 1 spray into both nostrils daily.   folic acid (FOLVITE) 1 MG tablet Take 1 mg by mouth daily.   frovatriptan (FROVA) 2.5 MG tablet Take 2.5 mg by mouth daily as needed.   ketamine (KETALAR) 10 MG/ML injection Inject into the vein every 3 (three) months.    ketorolac  (TORADOL ) 10 MG tablet Take 10 mg by mouth as needed.   Misc. Devices MISC Dispense central line dressing kit includes One pair sterile gloves One ChloraPrep  One-Step applicator, 3mL  One isopropyl alcohol swabstick One 2" x 2" gauze dressing One 4" x 4" gauze dressing One roll medical tape   Multiple Vitamin (MULTIVITAMIN) capsule Take 1 capsule by mouth daily.   omeprazole  (PRILOSEC) 40 MG capsule TAKE 1 CAPSULE BY MOUTH DAILY   Oxcarbazepine (TRILEPTAL) 300 MG tablet Take 300 mg by mouth 2 (two) times daily.   pregabalin  (LYRICA ) 50 MG capsule TAKE 1 CAPSULE(50 MG) BY MOUTH THREE TIMES DAILY   Probiotic Product (PROBIOTIC PO) Take by mouth.   rosuvastatin  (CRESTOR ) 20 MG tablet Take 1 tablet (20 mg total) by mouth daily.   tiZANidine (ZANAFLEX) 4 MG tablet Take 4 mg by mouth every 6 (six) hours as needed.   traZODone  (DESYREL ) 100 MG tablet TAKE 2 TABLETS(200 MG) BY MOUTH AT BEDTIME AS NEEDED FOR SLEEP   Turmeric (QC TUMERIC COMPLEX PO) Take by mouth.   vitamin B-12 (CYANOCOBALAMIN ) 1000 MCG tablet Take 1,000 mcg by mouth daily.   vitamin C (ASCORBIC ACID) 500 MG tablet Take 500 mg by mouth daily.   No facility-administered encounter medications on file as of 05/14/2024.    Allergies (verified) Azithromycin, Methadone, Tapentadol, Phenobarbital, and Wellbutrin  [bupropion ]   History: Past Medical History:  Diagnosis Date   Allergy    rhinitis   Anemia    Anxiety    Blood transfusion    x2 1983 surgery blood loss   Blood transfusion without reported diagnosis    Depression    Depression    Facial paralysis    right face/ chronic pain syndrome (pain management Dr Merrilee Abernethy)   BJYNWGNF(621.3)    Hyperlipidemia    Hypotension    Iron deficiency anemia 11/15/2014   Iron malabsorption 11/15/2014   Osteoarthritis    Trigeminal neuralgia    right   Trigeminal neuralgia 2002   Past Surgical History:  Procedure Laterality Date   COLONOSCOPY     ESOPHAGOGASTRODUODENOSCOPY     gamma knife Right 01/2018   MANDIBLE SURGERY     NASAL SINUS SURGERY     OSTEOTOMY     plastic facial surgery     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    Family History  Problem Relation Age of Onset   Breast cancer Mother        lumpectomy and radiation   Obesity Mother    Stroke Father    Diverticulitis Father    Hypertension Father    Hyperlipidemia Father    Depression Father    Alzheimer's disease Father    Colon cancer Neg Hx    Stomach cancer Neg Hx    Esophageal cancer Neg Hx    Pancreatic cancer Neg Hx    Social History   Socioeconomic History   Marital status: Significant Other    Spouse name: [name]  Number of children: 0   Years of education: Masters Degree   Highest education level: Master's degree (e.g., MA, MS, MEng, MEd, MSW, MBA)  Occupational History   Occupation: disabled/self employed  Tobacco Use   Smoking status: Some Days    Current packs/day: 0.00    Types: Cigarettes    Start date: 12/31/1994    Last attempt to quit: 12/31/2000    Years since quitting: 23.3   Smokeless tobacco: Never   Tobacco comments:    Social smoker   Vaping Use   Vaping status: Never Used  Substance and Sexual Activity   Alcohol use: No    Alcohol/week: 0.0 standard drinks of alcohol   Drug use: No   Sexual activity: Yes    Partners: Male  Other Topics Concern   Not on file  Social History Narrative   Wants to start back exercising. Goal is to loose 20 to 40 lbs in the next year. Coffee in the morning and tea during the day   Right handed   Drinks caffeine   Lives with partner   Two floor home   Social Drivers of Health   Financial Resource Strain: Low Risk  (05/14/2024)   Overall Financial Resource Strain (CARDIA)    Difficulty of Paying Living Expenses: Not very hard  Recent Concern: Financial Resource Strain - High Risk (03/22/2024)   [facility]   Overall Financial Resource Strain (CARDIA)    Difficulty of Paying Living Expenses: Hard  Food Insecurity: Food Insecurity Present (05/14/2024)   Hunger Vital Sign    Worried About Running Out of Food in the Last Year: Sometimes true    Ran Out of  Food in the Last Year: Sometimes true  Transportation Needs: No Transportation Needs (05/14/2024)   PRAPARE - Administrator, Civil Service (Medical): No    Lack of Transportation (Non-Medical): No  Recent Concern: Transportation Needs - Unmet Transportation Needs (03/22/2024)   Received from [facility]   PRAPARE - Transportation    Lack of Transportation (Medical): Yes    Lack of Transportation (Non-Medical): Yes  Physical Activity: Insufficiently Active (05/14/2024)   Exercise Vital Sign    Days of Exercise per Week: 1 day    Minutes of Exercise per Session: 10 min  Stress: Stress Concern Present (05/14/2024)   [employer] of Occupational Health - Occupational Stress Questionnaire    Feeling of Stress : To some extent  Social Connections: Moderately Isolated (05/14/2024)   Social Connection and Isolation Panel [NHANES]    Frequency of Communication with Friends and Family: More than three times a week    Frequency of Social Gatherings with Friends and Family: More than three times a week    Attends Religious Services: Never    Database administrator or Organizations: No    Attends Engineer, structural: Never    Marital Status: Living with partner    Tobacco Counseling Ready to quit: Not Answered Counseling given: Not Answered Tobacco comments: Social smoker    Clinical Intake:  Pre-visit preparation completed: Yes  Pain : 0-10 Pain Score: 9  Pain Type: Chronic pain Pain Location: Head Pain Orientation: Right Pain Descriptors / Indicators: Shooting, Sharp, Stabbing Pain Onset: More than a month ago Pain Frequency: Intermittent Pain Relieving Factors: pain patch  Pain Relieving Factors: pain patch  BMI - recorded: 34.95 Nutritional Status: BMI > 30  Obese Nutritional Risks: None Diabetes: No  How often do you need to have  someone help you when you read instructions, pamphlets, or other written materials from your doctor or pharmacy?: 1 -  Never What is the last grade level you completed in school?: 18  Interpreter Needed?: No      Activities of Daily Living    05/14/2024    3:12 PM 05/14/2024    8:40 AM  In your present state of health, do you have any difficulty performing the following activities:  Hearing? 1 1  Vision? 1 1  Difficulty concentrating or making decisions? 1 1  Walking or climbing stairs? 1 1  Dressing or bathing? 1 1  Doing errands, shopping? 1 1  Preparing Food and eating ? N N  Using the Toilet? N N  In the past six months, have you accidently leaked urine? Y Y  Do you have problems with loss of bowel control? N N  Managing your Medications? N N  Managing your Finances? Desiree Wilson  Housekeeping or managing your Housekeeping? Desiree Wilson    Patient Care Team: Cydney Draft, MD as PCP - General (Family Medicine) Melodie Spry, MD as Consulting Physician (Nephrology) Sandralee Crow, MD as Referring Physician (Pain Medicine) Natalie Bailey, LCSW as Social Worker (Psychology) Patel, Donika K, DO as Consulting Physician (Neurology)  Indicate any recent Medical Services you may have received from other than [facility] providers in the past year (date may be approximate).     Assessment:   This is a routine wellness examination for Desiree Wilson.  Hearing/Vision screen No results found.   Goals Addressed             This Visit's Progress    Patient Stated       Patient stated she would like to have surgery so she can live with less pain.       Depression Screen    05/14/2024    3:22 PM 10/28/2023    7:37 AM 07/02/2023    9:24 AM 03/06/2023    9:54 AM 12/20/2022    1:11 PM 09/26/2022   11:49 AM 05/15/2022    9:31 AM  PHQ 2/9 Scores  PHQ - 2 Score 1 2 3 2  0 1 3  PHQ- 9 Score  6  6 10 12 21     Fall Risk    05/14/2024    3:25 PM 05/14/2024    8:40 AM 03/03/2024    9:50 AM 10/28/2023    7:36 AM 07/02/2023    9:07 AM  Fall Risk   Falls in the past year? 1 1 1 1 1   Number falls in past yr: 1 1 1  1 1   Injury with Fall? 0 0 1 1 0  Risk for fall due to : History of fall(s);Impaired mobility   Medication side effect Impaired balance/gait  Follow up Falls evaluation completed  Falls evaluation completed Falls evaluation completed Falls evaluation completed    MEDICARE RISK AT HOME: Medicare Risk at Home Any stairs in or around the home?: Yes If so, are there any without handrails?: No Home free of loose throw rugs in walkways, pet beds, electrical cords, etc?: Yes Adequate lighting in your home to reduce risk of falls?: Yes Life alert?: No Use of a cane, walker or w/c?: No Grab bars in the bathroom?: Yes Shower chair or bench in shower?: Yes Elevated toilet seat or a handicapped toilet?: No  TIMED UP AND GO:  Was the test performed?  No    Cognitive Function:  05/14/2024    3:26 PM 10/27/2018    2:03 PM  6CIT Screen  What Year? 0 points 0 points  What month? 0 points 0 points  What time? 0 points 0 points  Count back from 20 0 points 0 points  Months in reverse 0 points 0 points  Repeat phrase 4 points 0 points  Total Score 4 points 0 points    Immunizations Immunization History  Administered Date(s) Administered   Influenza Split 11/25/2005, 12/25/2006, 10/20/2007, 09/26/2008   Influenza, Seasonal, Injecte, Preservative Fre 10/28/2023   Influenza,inj,Quad PF,6+ Mos 08/17/2014, 08/26/2015, 08/20/2016, 01/15/2018, 10/27/2018, 01/19/2020, 10/26/2020, 12/14/2021, 09/26/2022   Influenza-Unspecified 09/30/2013   PFIZER(Purple Top)SARS-COV-2 Vaccination 03/05/2020, 04/05/2020, 10/20/2020   Pfizer Covid-19 Vaccine Bivalent Booster 106yrs & up 12/01/2021   Td 08/10/2010   Tdap 10/26/2020   Zoster Recombinant(Shingrix) 07/17/2021, 12/14/2021    TDAP status: Up to date  Flu Vaccine status: Up to date  Pneumococcal vaccine status: Declined,  Education has been provided regarding the importance of this vaccine but patient still declined. Advised may receive this  vaccine at local pharmacy or Health Dept. Aware to provide a copy of the vaccination record if obtained from local pharmacy or Health Dept. Verbalized acceptance and understanding.   Covid-19 vaccine status: Information provided on how to obtain vaccines.   Qualifies for Shingles Vaccine? Yes   Zostavax completed No   Shingrix Completed?: Yes  Screening Tests Health Maintenance  Topic Date Due   COVID-19 Vaccine (5 - 2024-25 season) 09/01/2023   INFLUENZA VACCINE  07/31/2024   MAMMOGRAM  04/30/2025   Medicare Annual Wellness (AWV)  05/14/2025   Cervical Cancer Screening (HPV/Pap Cotest)  06/10/2025   Colonoscopy  10/12/2028   DTaP/Tdap/Td (3 - Td or Tdap) 10/26/2030   Hepatitis C Screening  Completed   HIV Screening  Completed   Zoster Vaccines- Shingrix  Completed   HPV VACCINES  Aged Out   Meningococcal B Vaccine  Aged Out    Health Maintenance  Health Maintenance Due  Topic Date Due   COVID-19 Vaccine (5 - 2024-25 season) 09/01/2023    Colorectal cancer screening: Type of screening: Colonoscopy. Completed 10/12/2021. Repeat every 7 years  Mammogram status: Ordered  . Pt provided with contact info and advised to call to schedule appt.    Lung Cancer Screening: (Low Dose CT Chest recommended if Age 84-80 years, 20 pack-year currently smoking OR have quit w/in 15years.) does qualify.   Lung Cancer Screening Referral: placed today.   Additional Screening:  Hepatitis C Screening: does qualify; Completed 03/07/2023  Vision Screening: Recommended annual ophthalmology exams for early detection of glaucoma and other disorders of the eye. Is the patient up to date with their annual eye exam?  Yes  Who is the provider or what is the name of the office in which the patient attends annual eye exams? [facility] If pt is not established with a provider, would they like to be referred to a provider to establish care? N/a.   Dental Screening: Recommended annual dental exams for proper  oral hygiene   Community Resource Referral / Chronic Care Management: CRR required this visit?  Yes   CCM required this visit?  No     Plan:     I have personally reviewed and noted the following in the patient's chart:   Medical and social history Use of alcohol, tobacco or illicit drugs  Current medications and supplements including opioid prescriptions. Patient is currently taking opioid prescriptions. Information provided to patient  regarding non-opioid alternatives. Patient advised to discuss non-opioid treatment plan with their provider. Functional ability and status Nutritional status Physical activity Advanced directives List of other physicians Hospitalizations, surgeries, and ER # 1 visits in previous 12 months Vitals Screenings to include cognitive, depression, and falls Referrals and appointments  In addition, I have reviewed and discussed with patient certain preventive protocols, quality metrics, and best practice recommendations. A written personalized care plan for preventive services as well as general preventive health recommendations were provided to patient.     Aubrey Leaf, CMA   05/14/2024   After Visit Summary: (MyChart) Due to this being a telephonic visit, the after visit summary with patients personalized plan was offered to patient via MyChart   Nurse Notes:   Desiree Wilson is a 58 y.o. y.o. female patient of Duaine German, MD who had a Medicare Annual Wellness Visit today via telephone. She reports that he is socially active and does interact with friends/family regularly. She is moderately physically active.

## 2024-05-14 NOTE — Progress Notes (Signed)
 [facility] Counselor/Therapist Progress Note  Patient ID: Desiree Wilson, MRN: [id number],    Date: 05/14/2024  Time Spent: 9:00am-9:50am   50 minutes   Treatment Type: Individual Therapy  Reported Symptoms: pain, sadness  Mental Status Exam: Appearance:  Casual     Behavior: Appropriate  Motor: Normal  Speech/Language:  Normal Rate  Affect: Appropriate  Mood: normal  Thought process: normal  Thought content:   WNL  Sensory/Perceptual disturbances:   WNL  Orientation: oriented to person, place, time/date, and situation  Attention: Good  Concentration: Good  Memory: WNL  Fund of knowledge:  Good  Insight:   Good  Judgment:  Good  Impulse Control: Good   Risk Assessment: Danger to Self:  No Self-injurious Behavior: No Danger to Others: No Duty to Warn:no Physical Aggression / Violence:No  Access to Firearms a concern: No  Gang Involvement:No   Subjective: Pt present for face-to-face individual therapy via video.  Pt consents to telehealth video session and is aware of limitations and benefits of virtual sessions.  Location of pt: home Location of therapist: home office.   Pt talked about being unhappy bc she had her ketamine infusion last week and has not had any pain relief.  She feels pain in her entire body today.  She has no energy and it is even hard to walk.   Pt is going to [employer] and is going to request surgery as soon as possible.  Pt was tearful as she talked about how hard it is to cope with the pain.   Pt states her husband Desiree Wilson is being more supportive since she had a talk with him about what she needs.  Pt has connected with her brother more for support as well.  Pt is trying to use distraction to cope with the pain.  She has events planned with friends that she hopes will help.   Worked on self care strategies. Provided supportive therapy.  Interventions: Cognitive Behavioral Therapy and Insight-Oriented  Diagnosis:  F33.1 and  F41.1    Plan of Care: Recommend ongoing therapy.   Pt participated with setting treatment goals.   Pt states her goals for therapy are to have a place to process her feelings.   Pt wants to improve coping skills.  Plan to meet every month.  Pt agrees with treatment plan.  Treatment Plan (Treatment Plan Target Date: 04/16/2025) Client Abilities/Strengths  Pt is bright, engaging, and motivated for therapy.  Client Treatment Preferences  Individual therapy.  Client Statement of Needs  Improve copings skills and have a safe place to talk about feelings.   Symptoms  Depressed or irritable mood. Excessive and/or unrealistic worry that is difficult to control occurring more days than not for at least 6 months about a number of events or activities. Hypervigilance (e.g., feeling constantly on edge, experiencing concentration difficulties, having trouble falling or staying asleep, exhibiting a general state of irritability).  Problems Addressed  Unipolar Depression, Anxiety Goals 1. Alleviate depressive symptoms and return to previous level of effective functioning. 2. Appropriately grieve the loss in order to normalize mood and to return to previously adaptive level of functioning. Objective Learn and implement behavioral strategies to overcome depression. Target Date: 2025-04-16 Frequency: Monthly  Progress: 45 Modality: individual  Related Interventions Assist the client in developing skills that increase the likelihood of deriving pleasure from behavioral activation (e.g., assertiveness skills, developing an exercise plan, less internal/more external focus, increased social involvement); reinforce success. Engage the client in "behavioral  activation," increasing his/her activity level and contact with sources of reward, while identifying processes that inhibit activation. use behavioral techniques such as instruction, rehearsal, role-playing, role reversal, as needed, to facilitate activity in  the client's daily life; reinforce success. 3. Develop healthy interpersonal relationships that lead to the alleviation and help prevent the relapse of depression. 4. Develop healthy thinking patterns and beliefs about self, others, and the world that lead to the alleviation and help prevent the relapse of depression. 5. Enhance ability to effectively cope with the full variety of life's worries and anxieties. 6. Learn and implement coping skills that result in a reduction of anxiety and worry, and improved daily functioning. Objective Learn and implement problem-solving strategies for realistically addressing worries. Target Date: 2025-04-16 Frequency: Monthly  Progress: 45 Modality: individual  Related Interventions Assign the client a homework exercise in which he/she problem-solves a current problem (see Mastery of Your Anxiety and Worry: Workbook by Colbert Dates and Edna Gouty or Generalized Anxiety Disorder by Woodson He, and Edna Gouty); review, reinforce success, and provide corrective feedback toward improvement. Teach the client problem-solving strategies involving specifically defining a problem, generating options for addressing it, evaluating the pros and cons of each option, selecting and implementing an optional action, and reevaluating and refining the action. Objective Learn and implement calming skills to reduce overall anxiety and manage anxiety symptoms. Target Date: 2025-04-16 Frequency: Monthly  Progress: 45 Modality: individual  Related Interventions Assign the client to read about progressive muscle relaxation and other calming strategies in relevant books or treatment manuals (e.g., Progressive Relaxation Training by Juleen Oakland; Mastery of Your Anxiety and Worry: Workbook by Rodney Clamp). Assign the client homework each session in which he/she practices relaxation exercises daily, gradually applying them progressively from non-anxiety-provoking to anxiety-provoking  situations; review and reinforce success while providing corrective feedback toward improvement. Teach the client calming/relaxation skills (e.g., applied relaxation, progressive muscle relaxation, cue controlled relaxation; mindful breathing; biofeedback) and how to discriminate better between relaxation and tension; teach the client how to apply these skills to his/her daily life. 7. Recognize, accept, and cope with feelings of depression. 8. Reduce overall frequency, intensity, and duration of the anxiety so that daily functioning is not impaired. 9. Resolve the core conflict that is the source of anxiety. 10. Stabilize anxiety level while increasing ability to function on a daily basis. Diagnosis Axis none F33.1 Major Depressive Disorder  Axis none F41.1 General Anxiety Disorder  Conditions For Discharge Achievement of treatment goals and objectives   Willey Harrier, LCSW

## 2024-05-14 NOTE — Patient Instructions (Signed)
  Desiree Wilson , Thank you for taking time to come for your Medicare Wellness Visit. I appreciate your ongoing commitment to your health goals. Please review the following plan we discussed and let me know if I can assist you in the future.   These are the goals we discussed:  Goals      Medication Management     Patient Goals/Self-Care Activities Over the next 180 days, patient will:  take medications as prescribed  Follow Up Plan: Telephone follow up appointment with care management team member scheduled for:  6 months     Patient Stated     Patient stated she would like to have surgery so she can live with less pain.      Weight (lb) < 200 lb (90.7 kg)     Wants to loose 20-40 lbs in the next year. Start back exercising and drink more water.        This is a list of the screening recommended for you and due dates:  Health Maintenance  Topic Date Due   COVID-19 Vaccine (5 - 2024-25 season) 09/01/2023   Flu Shot  07/31/2024   Mammogram  04/30/2025   Medicare Annual Wellness Visit  05/14/2025   Pap with HPV screening  06/10/2025   Colon Cancer Screening  10/12/2028   DTaP/Tdap/Td vaccine (3 - Td or Tdap) 10/26/2030   Hepatitis C Screening  Completed   HIV Screening  Completed   Zoster (Shingles) Vaccine  Completed   HPV Vaccine  Aged Out   Meningitis B Vaccine  Aged Out

## 2024-05-15 DIAGNOSIS — M4802 Spinal stenosis, cervical region: Secondary | ICD-10-CM | POA: Diagnosis not present

## 2024-05-17 DIAGNOSIS — Z79891 Long term (current) use of opiate analgesic: Secondary | ICD-10-CM | POA: Diagnosis not present

## 2024-05-17 DIAGNOSIS — G894 Chronic pain syndrome: Secondary | ICD-10-CM | POA: Diagnosis not present

## 2024-05-17 DIAGNOSIS — Z79899 Other long term (current) drug therapy: Secondary | ICD-10-CM | POA: Diagnosis not present

## 2024-05-17 DIAGNOSIS — M542 Cervicalgia: Secondary | ICD-10-CM | POA: Diagnosis not present

## 2024-05-26 DIAGNOSIS — G894 Chronic pain syndrome: Secondary | ICD-10-CM | POA: Diagnosis not present

## 2024-06-02 DIAGNOSIS — M4802 Spinal stenosis, cervical region: Secondary | ICD-10-CM | POA: Diagnosis not present

## 2024-06-02 DIAGNOSIS — M47812 Spondylosis without myelopathy or radiculopathy, cervical region: Secondary | ICD-10-CM | POA: Diagnosis not present

## 2024-06-05 ENCOUNTER — Encounter: Payer: Self-pay | Admitting: Family Medicine

## 2024-06-08 MED ORDER — VARENICLINE TARTRATE 1 MG PO TABS: 1.0000 mg | ORAL_TABLET | Freq: Two times a day (BID) | ORAL | 1 refills | Status: DC

## 2024-06-08 MED ORDER — VARENICLINE TARTRATE (STARTER) 0.5 MG X 11 & 1 MG X 42 PO TBPK: ORAL_TABLET | ORAL | 0 refills | Status: DC

## 2024-06-10 DIAGNOSIS — F339 Major depressive disorder, recurrent, unspecified: Secondary | ICD-10-CM | POA: Diagnosis not present

## 2024-06-10 DIAGNOSIS — F411 Generalized anxiety disorder: Secondary | ICD-10-CM | POA: Diagnosis not present

## 2024-06-10 DIAGNOSIS — F4001 Agoraphobia with panic disorder: Secondary | ICD-10-CM | POA: Diagnosis not present

## 2024-06-10 DIAGNOSIS — F17200 Nicotine dependence, unspecified, uncomplicated: Secondary | ICD-10-CM | POA: Diagnosis not present

## 2024-06-17 DIAGNOSIS — Z79891 Long term (current) use of opiate analgesic: Secondary | ICD-10-CM | POA: Diagnosis not present

## 2024-06-17 DIAGNOSIS — G894 Chronic pain syndrome: Secondary | ICD-10-CM | POA: Diagnosis not present

## 2024-06-17 DIAGNOSIS — Z79899 Other long term (current) drug therapy: Secondary | ICD-10-CM | POA: Diagnosis not present

## 2024-06-17 DIAGNOSIS — M542 Cervicalgia: Secondary | ICD-10-CM | POA: Diagnosis not present

## 2024-06-19 ENCOUNTER — Ambulatory Visit: Admitting: Psychology

## 2024-06-22 ENCOUNTER — Encounter: Payer: Self-pay | Admitting: Family Medicine

## 2024-06-25 NOTE — Progress Notes (Signed)
   06/25/2024  Patient ID: Desiree Wilson, female   DOB: 1966/05/08, 58 y.o.   MRN: [id number]  This patient is appearing on a report for being at risk of failing the adherence measure for cholesterol (statin) medications this calendar year.   Medication: rosuvastatin  20mg   Last fill date: 06/05/2024 for 90 day supply  Insurance report was not up to date. No action needed at this time.   Channing DELENA Mealing, PharmD, DPLA

## 2024-07-14 DIAGNOSIS — F411 Generalized anxiety disorder: Secondary | ICD-10-CM | POA: Diagnosis not present

## 2024-07-14 DIAGNOSIS — F4001 Agoraphobia with panic disorder: Secondary | ICD-10-CM | POA: Diagnosis not present

## 2024-07-14 DIAGNOSIS — F4310 Post-traumatic stress disorder, unspecified: Secondary | ICD-10-CM | POA: Diagnosis not present

## 2024-07-14 DIAGNOSIS — F339 Major depressive disorder, recurrent, unspecified: Secondary | ICD-10-CM | POA: Diagnosis not present

## 2024-07-17 DIAGNOSIS — G894 Chronic pain syndrome: Secondary | ICD-10-CM | POA: Diagnosis not present

## 2024-07-17 DIAGNOSIS — Z79899 Other long term (current) drug therapy: Secondary | ICD-10-CM | POA: Diagnosis not present

## 2024-07-17 DIAGNOSIS — Z79891 Long term (current) use of opiate analgesic: Secondary | ICD-10-CM | POA: Diagnosis not present

## 2024-07-21 DIAGNOSIS — G501 Atypical facial pain: Secondary | ICD-10-CM | POA: Diagnosis not present

## 2024-07-21 DIAGNOSIS — G894 Chronic pain syndrome: Secondary | ICD-10-CM | POA: Diagnosis not present

## 2024-07-21 DIAGNOSIS — G500 Trigeminal neuralgia: Secondary | ICD-10-CM | POA: Diagnosis not present

## 2024-07-23 ENCOUNTER — Ambulatory Visit: Admitting: Psychology

## 2024-07-23 DIAGNOSIS — F331 Major depressive disorder, recurrent, moderate: Secondary | ICD-10-CM | POA: Diagnosis not present

## 2024-07-23 DIAGNOSIS — F411 Generalized anxiety disorder: Secondary | ICD-10-CM | POA: Diagnosis not present

## 2024-07-23 NOTE — Progress Notes (Signed)
 [facility] Counselor/Therapist Progress Note  Patient ID: ADLINE KIRSHENBAUM, MRN: [id number],    Date: 07/23/2024  Time Spent: 9:00am-9:45am   45 minutes   Treatment Type: Individual Therapy  Reported Symptoms: pain, sadness  Mental Status Exam: Appearance:  Casual     Behavior: Appropriate  Motor: Normal  Speech/Language:  Normal Rate  Affect: Appropriate  Mood: normal  Thought process: normal  Thought content:   WNL  Sensory/Perceptual disturbances:   WNL  Orientation: oriented to person, place, time/date, and situation  Attention: Good  Concentration: Good  Memory: WNL  Fund of knowledge:  Good  Insight:   Good  Judgment:  Good  Impulse Control: Good   Risk Assessment: Danger to Self:  No Self-injurious Behavior: No Danger to Others: No Duty to Warn:no Physical Aggression / Violence:No  Access to Firearms a concern: No  Gang Involvement:No   Subjective: Pt present for face-to-face individual therapy via video.  Pt consents to telehealth video session and is aware of limitations and benefits of virtual sessions.  Location of pt: home Location of therapist: home office.   Pt talked about being in a lot of pain today.  She has surgery scheduled at [facility] on 8/12 for a spinal cortex stimulator for pain control.  Pt is hopeful that this will provide some relief for her.   Pt states her husband Oneil is trying to be more supportive and they have been planning events to do together to improve the quality of their life.  They are celebrating the 56 year anniversary of their first date and the one year anniversary of their marriage. Pt is trying to use distraction to cope with the pain.   Worked on self care strategies. Provided supportive therapy.  Interventions: Cognitive Behavioral Therapy and Insight-Oriented  Diagnosis:  F33.1 and F41.1    Plan of Care: Recommend ongoing therapy.   Pt participated with setting treatment goals.   Pt states her goals for  therapy are to have a place to process her feelings.   Pt wants to improve coping skills.  Plan to meet every month.  Pt agrees with treatment plan.  Treatment Plan (Treatment Plan Target Date: 04/16/2025) Client Abilities/Strengths  Pt is bright, engaging, and motivated for therapy.  Client Treatment Preferences  Individual therapy.  Client Statement of Needs  Improve copings skills and have a safe place to talk about feelings.   Symptoms  Depressed or irritable mood. Excessive and/or unrealistic worry that is difficult to control occurring more days than not for at least 6 months about a number of events or activities. Hypervigilance (e.g., feeling constantly on edge, experiencing concentration difficulties, having trouble falling or staying asleep, exhibiting a general state of irritability).  Problems Addressed  Unipolar Depression, Anxiety Goals 1. Alleviate depressive symptoms and return to previous level of effective functioning. 2. Appropriately grieve the loss in order to normalize mood and to return to previously adaptive level of functioning. Objective Learn and implement behavioral strategies to overcome depression. Target Date: 2025-04-16 Frequency: Monthly  Progress: 45 Modality: individual  Related Interventions Assist the client in developing skills that increase the likelihood of deriving pleasure from behavioral activation (e.g., assertiveness skills, developing an exercise plan, less internal/more external focus, increased social involvement); reinforce success. Engage the client in behavioral activation, increasing his/her activity level and contact with sources of reward, while identifying processes that inhibit activation. use behavioral techniques such as instruction, rehearsal, role-playing, role reversal, as needed, to facilitate activity in the client's  daily life; reinforce success. 3. Develop healthy interpersonal relationships that lead to the alleviation and  help prevent the relapse of depression. 4. Develop healthy thinking patterns and beliefs about self, others, and the world that lead to the alleviation and help prevent the relapse of depression. 5. Enhance ability to effectively cope with the full variety of life's worries and anxieties. 6. Learn and implement coping skills that result in a reduction of anxiety and worry, and improved daily functioning. Objective Learn and implement problem-solving strategies for realistically addressing worries. Target Date: 2025-04-16 Frequency: Monthly  Progress: 45 Modality: individual  Related Interventions Assign the client a homework exercise in which he/she problem-solves a current problem (see Mastery of Your Anxiety and Worry: Workbook by Richarda and Jonne or Generalized Anxiety Disorder by Delores Filler, and Jonne); review, reinforce success, and provide corrective feedback toward improvement. Teach the client problem-solving strategies involving specifically defining a problem, generating options for addressing it, evaluating the pros and cons of each option, selecting and implementing an optional action, and reevaluating and refining the action. Objective Learn and implement calming skills to reduce overall anxiety and manage anxiety symptoms. Target Date: 2025-04-16 Frequency: Monthly  Progress: 45 Modality: individual  Related Interventions Assign the client to read about progressive muscle relaxation and other calming strategies in relevant books or treatment manuals (e.g., Progressive Relaxation Training by Thornell armin Collier; Mastery of Your Anxiety and Worry: Workbook by Richarda armin Jonne). Assign the client homework each session in which he/she practices relaxation exercises daily, gradually applying them progressively from non-anxiety-provoking to anxiety-provoking situations; review and reinforce success while providing corrective feedback toward improvement. Teach the client  calming/relaxation skills (e.g., applied relaxation, progressive muscle relaxation, cue controlled relaxation; mindful breathing; biofeedback) and how to discriminate better between relaxation and tension; teach the client how to apply these skills to his/her daily life. 7. Recognize, accept, and cope with feelings of depression. 8. Reduce overall frequency, intensity, and duration of the anxiety so that daily functioning is not impaired. 9. Resolve the core conflict that is the source of anxiety. 10. Stabilize anxiety level while increasing ability to function on a daily basis. Diagnosis Axis none F33.1 Major Depressive Disorder  Axis none F41.1 General Anxiety Disorder  Conditions For Discharge Achievement of treatment goals and objectives   Veva Alma, LCSW

## 2024-07-24 ENCOUNTER — Other Ambulatory Visit: Payer: Self-pay | Admitting: Family Medicine

## 2024-07-27 ENCOUNTER — Ambulatory Visit (INDEPENDENT_AMBULATORY_CARE_PROVIDER_SITE_OTHER): Admitting: Family Medicine

## 2024-07-27 VITALS — BP 129/80 | HR 81 | Ht 65.0 in | Wt 212.8 lb

## 2024-07-27 DIAGNOSIS — G894 Chronic pain syndrome: Secondary | ICD-10-CM | POA: Diagnosis not present

## 2024-07-27 DIAGNOSIS — F331 Major depressive disorder, recurrent, moderate: Secondary | ICD-10-CM | POA: Diagnosis not present

## 2024-07-27 DIAGNOSIS — E871 Hypo-osmolality and hyponatremia: Secondary | ICD-10-CM

## 2024-07-27 DIAGNOSIS — I10 Essential (primary) hypertension: Secondary | ICD-10-CM | POA: Diagnosis not present

## 2024-07-27 DIAGNOSIS — R7303 Prediabetes: Secondary | ICD-10-CM

## 2024-07-27 MED ORDER — PREGABALIN 50 MG PO CAPS: ORAL_CAPSULE | ORAL | 3 refills | Status: DC

## 2024-07-27 NOTE — Assessment & Plan Note (Signed)
 Did have A1c this past spring that looked fantastic we will plan to check again in the fall.

## 2024-07-27 NOTE — Assessment & Plan Note (Signed)
 Does like she is doing okay with her Cymbalta  regimen.  Continue current regimen and follow-up as needed.

## 2024-07-27 NOTE — Assessment & Plan Note (Signed)
 Think he blood pressure looks great today.  Continue current regimen.

## 2024-07-27 NOTE — Assessment & Plan Note (Signed)
 Refilled Lyrica  today not sure what happened with refill request last week so apologize.  She is on Butrans patch for her pain management as well.

## 2024-07-27 NOTE — Progress Notes (Signed)
 Established Patient Office Visit  Subjective  Patient ID: Desiree Wilson, female    DOB: [birth year]  Age: 58 y.o. MRN: [id number]  Chief Complaint  Patient presents with   Hypertension   Trigeminal Neuralgia    Patient states she has upcoming surgery at [facility] - schdl for 08/11/2024   Depression    Follow up visit    HPI  Hypertension- Pt denies chest pain, SOB, dizziness, or heart palpitations.  Taking meds as directed w/o problems.  Denies medication side effects.     Follow-up depression-currently on Cymbalta  for pain control and for mood.  Also uses clonazepam  as needed.  Tobacco abuse -she said she started to use the Chantix  but was still having some cravings so stopped it.  She decided to pick up a mint inhaler that does not have nicotine in it and try to use that instead to help her break the habit.  For her trigeminal neuralgia she did have a follow-up with [facility] and they have recommended a cortex stimulator.  So she will be getting a test 1 placed in about 3 weeks.  On August 12.    ROS    Objective:     BP 129/80   Pulse 81   Ht 5' 5 (1.651 m)   Wt 212 lb 12 oz (96.5 kg)   SpO2 95%   BMI 35.40 kg/m    Physical Exam Vitals and nursing note reviewed.  Constitutional:      Appearance: Normal appearance.  HENT:     Head: Normocephalic and atraumatic.  Eyes:     Conjunctiva/sclera: Conjunctivae normal.  Cardiovascular:     Rate and Rhythm: Normal rate and regular rhythm.  Pulmonary:     Effort: Pulmonary effort is normal.     Breath sounds: Normal breath sounds.  Skin:    General: Skin is warm and dry.  Neurological:     Mental Status: She is alert.  Psychiatric:        Mood and Affect: Mood normal.      No results found for any visits on 07/27/24.    The 10-year ASCVD risk score (Arnett DK, et al., 2019) is: 5.4%    Assessment & Plan:   Problem List Items Addressed This Visit       Cardiovascular and Mediastinum   Primary  hypertension - Primary   Think he blood pressure looks great today.  Continue current regimen.      Relevant Orders   CMP14+EGFR   Lipid panel     Other   Prediabetes   Did have A1c this past spring that looked fantastic we will plan to check again in the fall.      Relevant Orders   CMP14+EGFR   Lipid panel   Moderate episode of recurrent major depressive disorder (HCC)   Does like she is doing okay with her Cymbalta  regimen.  Continue current regimen and follow-up as needed.      Chronic pain syndrome   Refilled Lyrica  today not sure what happened with refill request last week so apologize.  She is on Butrans patch for her pain management as well.      Relevant Medications   pregabalin  (LYRICA ) 50 MG capsule   Other Visit Diagnoses       Hyponatremia       Relevant Orders   CMP14+EGFR   Lipid panel       Return in about 6 months (around 01/27/2025) for Hypertension, Pre-diabetes.  Dorothyann Byars, MD

## 2024-07-28 ENCOUNTER — Ambulatory Visit: Payer: Self-pay | Admitting: Family Medicine

## 2024-07-28 LAB — CMP14+EGFR
ALT: 20 IU/L (ref 0–32)
AST: 25 IU/L (ref 0–40)
Albumin: 4 g/dL (ref 3.8–4.9)
Alkaline Phosphatase: 123 IU/L — ABNORMAL HIGH (ref 44–121)
BUN/Creatinine Ratio: 17 (ref 9–23)
BUN: 16 mg/dL (ref 6–24)
Bilirubin Total: 0.2 mg/dL (ref 0.0–1.2)
CO2: 22 mmol/L (ref 20–29)
Calcium: 9.4 mg/dL (ref 8.7–10.2)
Chloride: 100 mmol/L (ref 96–106)
Creatinine, Ser: 0.93 mg/dL (ref 0.57–1.00)
Globulin, Total: 2.7 g/dL (ref 1.5–4.5)
Glucose: 78 mg/dL (ref 70–99)
Potassium: 4.6 mmol/L (ref 3.5–5.2)
Sodium: 138 mmol/L (ref 134–144)
Total Protein: 6.7 g/dL (ref 6.0–8.5)
eGFR: 72 mL/min/1.73 (ref >59)

## 2024-07-28 LAB — LIPID PANEL
Chol/HDL Ratio: 3.1 ratio (ref 0.0–4.4)
Cholesterol, Total: 183 mg/dL (ref 100–199)
HDL: 59 mg/dL (ref >39)
LDL Chol Calc (NIH): 103 mg/dL — ABNORMAL HIGH (ref 0–99)
Triglycerides: 116 mg/dL (ref 0–149)
VLDL Cholesterol Cal: 21 mg/dL (ref 5–40)

## 2024-07-28 NOTE — Progress Notes (Signed)
 Hi Elysa, alkaline phosphatase is just borderline elevated by 2 points not in a concerning or worrisome range.  The rest your liver enzymes look great.  LDL is just borderline elevated continue to work on healthy diet.

## 2024-07-31 DIAGNOSIS — R7303 Prediabetes: Secondary | ICD-10-CM | POA: Diagnosis not present

## 2024-07-31 DIAGNOSIS — I10 Essential (primary) hypertension: Secondary | ICD-10-CM | POA: Diagnosis not present

## 2024-07-31 DIAGNOSIS — Z01818 Encounter for other preprocedural examination: Secondary | ICD-10-CM | POA: Diagnosis not present

## 2024-07-31 DIAGNOSIS — G500 Trigeminal neuralgia: Secondary | ICD-10-CM | POA: Diagnosis not present

## 2024-08-10 DIAGNOSIS — F17200 Nicotine dependence, unspecified, uncomplicated: Secondary | ICD-10-CM | POA: Diagnosis not present

## 2024-08-10 DIAGNOSIS — F411 Generalized anxiety disorder: Secondary | ICD-10-CM | POA: Diagnosis not present

## 2024-08-10 DIAGNOSIS — F4310 Post-traumatic stress disorder, unspecified: Secondary | ICD-10-CM | POA: Diagnosis not present

## 2024-08-10 DIAGNOSIS — F339 Major depressive disorder, recurrent, unspecified: Secondary | ICD-10-CM | POA: Diagnosis not present

## 2024-08-11 DIAGNOSIS — E66812 Obesity, class 2: Secondary | ICD-10-CM | POA: Diagnosis not present

## 2024-08-11 DIAGNOSIS — D509 Iron deficiency anemia, unspecified: Secondary | ICD-10-CM | POA: Diagnosis not present

## 2024-08-11 DIAGNOSIS — F3340 Major depressive disorder, recurrent, in remission, unspecified: Secondary | ICD-10-CM | POA: Diagnosis not present

## 2024-08-11 DIAGNOSIS — R7303 Prediabetes: Secondary | ICD-10-CM | POA: Diagnosis not present

## 2024-08-11 DIAGNOSIS — Z6835 Body mass index (BMI) 35.0-35.9, adult: Secondary | ICD-10-CM | POA: Diagnosis not present

## 2024-08-11 DIAGNOSIS — M792 Neuralgia and neuritis, unspecified: Secondary | ICD-10-CM | POA: Diagnosis not present

## 2024-08-11 DIAGNOSIS — Z79899 Other long term (current) drug therapy: Secondary | ICD-10-CM | POA: Diagnosis not present

## 2024-08-11 DIAGNOSIS — F329 Major depressive disorder, single episode, unspecified: Secondary | ICD-10-CM | POA: Diagnosis not present

## 2024-08-11 DIAGNOSIS — F17210 Nicotine dependence, cigarettes, uncomplicated: Secondary | ICD-10-CM | POA: Diagnosis not present

## 2024-08-11 DIAGNOSIS — I10 Essential (primary) hypertension: Secondary | ICD-10-CM | POA: Diagnosis not present

## 2024-08-11 DIAGNOSIS — G500 Trigeminal neuralgia: Secondary | ICD-10-CM | POA: Diagnosis not present

## 2024-08-11 DIAGNOSIS — G894 Chronic pain syndrome: Secondary | ICD-10-CM | POA: Diagnosis not present

## 2024-08-17 DIAGNOSIS — Z79891 Long term (current) use of opiate analgesic: Secondary | ICD-10-CM | POA: Diagnosis not present

## 2024-08-17 DIAGNOSIS — Z79899 Other long term (current) drug therapy: Secondary | ICD-10-CM | POA: Diagnosis not present

## 2024-08-17 DIAGNOSIS — G894 Chronic pain syndrome: Secondary | ICD-10-CM | POA: Diagnosis not present

## 2024-08-17 DIAGNOSIS — M542 Cervicalgia: Secondary | ICD-10-CM | POA: Diagnosis not present

## 2024-08-17 DIAGNOSIS — M792 Neuralgia and neuritis, unspecified: Secondary | ICD-10-CM | POA: Diagnosis not present

## 2024-08-25 ENCOUNTER — Ambulatory Visit: Admitting: Psychology

## 2024-08-25 DIAGNOSIS — F331 Major depressive disorder, recurrent, moderate: Secondary | ICD-10-CM

## 2024-08-25 DIAGNOSIS — F411 Generalized anxiety disorder: Secondary | ICD-10-CM

## 2024-08-25 NOTE — Progress Notes (Signed)
 [facility] Counselor/Therapist Progress Note  Patient ID: ABIR EROH, MRN: [id number],    Date: 08/25/2024  Time Spent: 9:00am-9:50am   50 minutes   Treatment Type: Individual Therapy  Reported Symptoms: pain, sadness  Mental Status Exam: Appearance:  Casual     Behavior: Appropriate  Motor: Normal  Speech/Language:  Normal Rate  Affect: Appropriate  Mood: normal  Thought process: normal  Thought content:   WNL  Sensory/Perceptual disturbances:   WNL  Orientation: oriented to person, place, time/date, and situation  Attention: Good  Concentration: Good  Memory: WNL  Fund of knowledge:  Good  Insight:   Good  Judgment:  Good  Impulse Control: Good   Risk Assessment: Danger to Self:  No Self-injurious Behavior: No Danger to Others: No Duty to Warn:no Physical Aggression / Violence:No  Access to Firearms a concern: No  Gang Involvement:No   Subjective: Pt present for face-to-face individual therapy via video.  Pt consents to telehealth video session and is aware of limitations and benefits of virtual sessions.  Location of pt: home Location of therapist: home office.   Pt talked about having the surgery at [facility] where they place the trial stimulator.  There were some complications but the rep fixed the programs and pt was able to achieve 80% pain relief.   This has given pt a lot of hope.   The trial stimulator was removed and pt has been in pain since which is sad for her but she hopes to have the surgery for the permanent stimulator implant in the near future.   In the meantime pt is trying to focus on improving her overall health and she wants to quit smoking and lose weight. Pt states her husband Oneil is trying to be more supportive and they have been planning events to do together to improve the quality of their life.  They have had some good talks about how they can reconnect and improve their relationship.  Pt talked about the state of the country  and how much stress that creates for her.   Worked on self care strategies. Provided supportive therapy.  Interventions: Cognitive Behavioral Therapy and Insight-Oriented  Diagnosis:  F33.1 and F41.1    Plan of Care: Recommend ongoing therapy.   Pt participated with setting treatment goals.   Pt states her goals for therapy are to have a place to process her feelings.   Pt wants to improve coping skills.  Plan to meet every month.  Pt agrees with treatment plan.  Treatment Plan (Treatment Plan Target Date: 04/16/2025) Client Abilities/Strengths  Pt is bright, engaging, and motivated for therapy.  Client Treatment Preferences  Individual therapy.  Client Statement of Needs  Improve copings skills and have a safe place to talk about feelings.   Symptoms  Depressed or irritable mood. Excessive and/or unrealistic worry that is difficult to control occurring more days than not for at least 6 months about a number of events or activities. Hypervigilance (e.g., feeling constantly on edge, experiencing concentration difficulties, having trouble falling or staying asleep, exhibiting a general state of irritability).  Problems Addressed  Unipolar Depression, Anxiety Goals 1. Alleviate depressive symptoms and return to previous level of effective functioning. 2. Appropriately grieve the loss in order to normalize mood and to return to previously adaptive level of functioning. Objective Learn and implement behavioral strategies to overcome depression. Target Date: 2025-04-16 Frequency: Monthly  Progress: 45 Modality: individual  Related Interventions Assist the client in developing skills that increase  the likelihood of deriving pleasure from behavioral activation (e.g., assertiveness skills, developing an exercise plan, less internal/more external focus, increased social involvement); reinforce success. Engage the client in behavioral activation, increasing his/her activity level and contact  with sources of reward, while identifying processes that inhibit activation. use behavioral techniques such as instruction, rehearsal, role-playing, role reversal, as needed, to facilitate activity in the client's daily life; reinforce success. 3. Develop healthy interpersonal relationships that lead to the alleviation and help prevent the relapse of depression. 4. Develop healthy thinking patterns and beliefs about self, others, and the world that lead to the alleviation and help prevent the relapse of depression. 5. Enhance ability to effectively cope with the full variety of life's worries and anxieties. 6. Learn and implement coping skills that result in a reduction of anxiety and worry, and improved daily functioning. Objective Learn and implement problem-solving strategies for realistically addressing worries. Target Date: 2025-04-16 Frequency: Monthly  Progress: 45 Modality: individual  Related Interventions Assign the client a homework exercise in which he/she problem-solves a current problem (see Mastery of Your Anxiety and Worry: Workbook by Richarda and Jonne or Generalized Anxiety Disorder by Delores Filler, and Jonne); review, reinforce success, and provide corrective feedback toward improvement. Teach the client problem-solving strategies involving specifically defining a problem, generating options for addressing it, evaluating the pros and cons of each option, selecting and implementing an optional action, and reevaluating and refining the action. Objective Learn and implement calming skills to reduce overall anxiety and manage anxiety symptoms. Target Date: 2025-04-16 Frequency: Monthly  Progress: 45 Modality: individual  Related Interventions Assign the client to read about progressive muscle relaxation and other calming strategies in relevant books or treatment manuals (e.g., Progressive Relaxation Training by Thornell armin Collier; Mastery of Your Anxiety and Worry: Workbook by  Richarda armin Jonne). Assign the client homework each session in which he/she practices relaxation exercises daily, gradually applying them progressively from non-anxiety-provoking to anxiety-provoking situations; review and reinforce success while providing corrective feedback toward improvement. Teach the client calming/relaxation skills (e.g., applied relaxation, progressive muscle relaxation, cue controlled relaxation; mindful breathing; biofeedback) and how to discriminate better between relaxation and tension; teach the client how to apply these skills to his/her daily life. 7. Recognize, accept, and cope with feelings of depression. 8. Reduce overall frequency, intensity, and duration of the anxiety so that daily functioning is not impaired. 9. Resolve the core conflict that is the source of anxiety. 10. Stabilize anxiety level while increasing ability to function on a daily basis. Diagnosis Axis none F33.1 Major Depressive Disorder  Axis none F41.1 General Anxiety Disorder  Conditions For Discharge Achievement of treatment goals and objectives   Veva Alma, LCSW

## 2024-09-01 ENCOUNTER — Encounter: Payer: Self-pay | Admitting: Sports Medicine

## 2024-09-03 ENCOUNTER — Telehealth: Payer: Self-pay | Admitting: Family Medicine

## 2024-09-03 MED ORDER — AMBULATORY NON FORMULARY MEDICATION: 0 refills | Status: DC

## 2024-09-03 NOTE — Telephone Encounter (Signed)
 Copied from CRM [id number]. Topic: Clinical - Prescription Issue >> Sep 03, 2024  9:55 AM Kevelyn M wrote: Reason for CRM: Patient [facility] vaccine. She's scheduled to get it on Sunday at 3:15pm 09/06/2024.  [facility] [address]  [place], [place] [address] Phone #[phone]

## 2024-09-03 NOTE — Telephone Encounter (Signed)
 This task has been completed as requested. Per patient's request, [facility] Rx [facility] to [facility] at [phone]. Successful fax transmission received. The pharmacy has confirmed the rx. The patient was notified of the update. No other inquiries during the call. No further action needed.

## 2024-09-03 NOTE — Addendum Note (Signed)
 Addended by: Holly Pring D on: 09/03/2024 01:30 PM   Modules accepted: Orders

## 2024-09-03 NOTE — Telephone Encounter (Signed)
 Prescription printed.  Please [facility] to [facility] per note below.  Because [facility] we may have to call call to confirm that they actually got it.

## 2024-09-07 DIAGNOSIS — F4310 Post-traumatic stress disorder, unspecified: Secondary | ICD-10-CM | POA: Diagnosis not present

## 2024-09-07 DIAGNOSIS — F4001 Agoraphobia with panic disorder: Secondary | ICD-10-CM | POA: Diagnosis not present

## 2024-09-07 DIAGNOSIS — F411 Generalized anxiety disorder: Secondary | ICD-10-CM | POA: Diagnosis not present

## 2024-09-07 DIAGNOSIS — F339 Major depressive disorder, recurrent, unspecified: Secondary | ICD-10-CM | POA: Diagnosis not present

## 2024-09-11 ENCOUNTER — Encounter: Payer: Self-pay | Admitting: Family Medicine

## 2024-09-11 DIAGNOSIS — F5101 Primary insomnia: Secondary | ICD-10-CM

## 2024-09-11 DIAGNOSIS — I10 Essential (primary) hypertension: Secondary | ICD-10-CM

## 2024-09-11 DIAGNOSIS — E7849 Other hyperlipidemia: Secondary | ICD-10-CM

## 2024-09-14 MED ORDER — TRAZODONE HCL 100 MG PO TABS: ORAL_TABLET | ORAL | 1 refills | Status: AC

## 2024-09-14 MED ORDER — ROSUVASTATIN CALCIUM 20 MG PO TABS: 20.0000 mg | ORAL_TABLET | Freq: Every day | ORAL | 3 refills | Status: AC

## 2024-09-14 NOTE — Telephone Encounter (Signed)
 OK both rx update and at [facility]

## 2024-09-15 MED ORDER — FROVATRIPTAN SUCCINATE 2.5 MG PO TABS: 2.5000 mg | ORAL_TABLET | Freq: Every day | ORAL | 1 refills | Status: AC | PRN

## 2024-09-15 MED ORDER — AMLODIPINE BESYLATE 10 MG PO TABS: 10.0000 mg | ORAL_TABLET | Freq: Every day | ORAL | 0 refills | Status: DC

## 2024-09-15 NOTE — Telephone Encounter (Signed)
 Patient requesting rx rf of Frova , BP medication ( amlodipine )   Frova - last written by hisotrical provider  Amlodipine  10mg  - last written 02/10/2024 Last OV 07/27/2024 Upcoming appt=01/21/2025  Amlodipine  was refilled per protocol  Frova  sent for provider review

## 2024-09-16 ENCOUNTER — Telehealth: Payer: Self-pay

## 2024-09-16 ENCOUNTER — Other Ambulatory Visit (HOSPITAL_COMMUNITY): Payer: Self-pay

## 2024-09-16 ENCOUNTER — Encounter: Payer: Self-pay | Admitting: Hematology & Oncology

## 2024-09-16 NOTE — Telephone Encounter (Signed)
 Copied from CRM [id number]. Topic: Clinical - Medication Prior Auth >> Sep 16, 2024  4:22 PM Susanna ORN wrote: Reason for CRM: [place], with [facility] Medicare, called to state that they did approve coverage on frovatriptan  (FROVA ) 2.5 MG tablet. She states the approval is good from 09/16/24 thru 09/16/25.

## 2024-09-16 NOTE — Telephone Encounter (Signed)
 Pharmacy Patient Advocate Encounter   Received notification from CoverMyMeds that prior authorization for Frovatriptan  2.5mg  tabs is required/requested.   Insurance verification completed.   The patient is insured through [facility] .   Per test claim: PA required; PA submitted to above mentioned insurance via Latent Key/confirmation #/EOC [facility] Status is pending

## 2024-09-17 ENCOUNTER — Other Ambulatory Visit (HOSPITAL_COMMUNITY): Payer: Self-pay

## 2024-09-17 DIAGNOSIS — I10 Essential (primary) hypertension: Secondary | ICD-10-CM | POA: Diagnosis not present

## 2024-09-17 DIAGNOSIS — G894 Chronic pain syndrome: Secondary | ICD-10-CM | POA: Diagnosis not present

## 2024-09-17 DIAGNOSIS — F17210 Nicotine dependence, cigarettes, uncomplicated: Secondary | ICD-10-CM | POA: Diagnosis not present

## 2024-09-17 DIAGNOSIS — M792 Neuralgia and neuritis, unspecified: Secondary | ICD-10-CM | POA: Diagnosis not present

## 2024-09-17 DIAGNOSIS — Z79899 Other long term (current) drug therapy: Secondary | ICD-10-CM | POA: Diagnosis not present

## 2024-09-17 DIAGNOSIS — Z79891 Long term (current) use of opiate analgesic: Secondary | ICD-10-CM | POA: Diagnosis not present

## 2024-09-17 NOTE — Telephone Encounter (Signed)
 Pharmacy Patient Advocate Encounter  Received notification from [facility] [employer] that Prior Authorization for Frovatriptan  2.5mg  tabs has been APPROVED from 09/16/24 to 09/16/25   PA #/Case ID/Reference #: [id number]

## 2024-09-25 ENCOUNTER — Ambulatory Visit: Admitting: Psychology

## 2024-10-02 DIAGNOSIS — M792 Neuralgia and neuritis, unspecified: Secondary | ICD-10-CM | POA: Diagnosis not present

## 2024-10-05 DIAGNOSIS — F4310 Post-traumatic stress disorder, unspecified: Secondary | ICD-10-CM | POA: Diagnosis not present

## 2024-10-05 DIAGNOSIS — F4001 Agoraphobia with panic disorder: Secondary | ICD-10-CM | POA: Diagnosis not present

## 2024-10-05 DIAGNOSIS — F411 Generalized anxiety disorder: Secondary | ICD-10-CM | POA: Diagnosis not present

## 2024-10-05 DIAGNOSIS — F339 Major depressive disorder, recurrent, unspecified: Secondary | ICD-10-CM | POA: Diagnosis not present

## 2024-10-05 DIAGNOSIS — Z95828 Presence of other vascular implants and grafts: Secondary | ICD-10-CM | POA: Diagnosis not present

## 2024-10-05 DIAGNOSIS — Z79899 Other long term (current) drug therapy: Secondary | ICD-10-CM | POA: Diagnosis not present

## 2024-10-05 DIAGNOSIS — G500 Trigeminal neuralgia: Secondary | ICD-10-CM | POA: Diagnosis not present

## 2024-10-05 DIAGNOSIS — G894 Chronic pain syndrome: Secondary | ICD-10-CM | POA: Diagnosis not present

## 2024-10-06 ENCOUNTER — Ambulatory Visit

## 2024-10-06 ENCOUNTER — Ambulatory Visit: Admitting: Family Medicine

## 2024-10-06 ENCOUNTER — Encounter: Payer: Self-pay | Admitting: Family Medicine

## 2024-10-06 VITALS — BP 137/83 | HR 91 | Ht 65.0 in | Wt 215.1 lb

## 2024-10-06 DIAGNOSIS — W19XXXA Unspecified fall, initial encounter: Secondary | ICD-10-CM

## 2024-10-06 DIAGNOSIS — M25511 Pain in right shoulder: Secondary | ICD-10-CM | POA: Diagnosis not present

## 2024-10-06 DIAGNOSIS — Z23 Encounter for immunization: Secondary | ICD-10-CM | POA: Diagnosis not present

## 2024-10-06 DIAGNOSIS — M79644 Pain in right finger(s): Secondary | ICD-10-CM | POA: Diagnosis not present

## 2024-10-06 DIAGNOSIS — M1811 Unilateral primary osteoarthritis of first carpometacarpal joint, right hand: Secondary | ICD-10-CM | POA: Diagnosis not present

## 2024-10-06 DIAGNOSIS — M79641 Pain in right hand: Secondary | ICD-10-CM

## 2024-10-06 DIAGNOSIS — R936 Abnormal findings on diagnostic imaging of limbs: Secondary | ICD-10-CM | POA: Diagnosis not present

## 2024-10-06 NOTE — Progress Notes (Signed)
 Pt reports falling on 9/17 and when she fell she fell onto her R hand injuring her R thumb. She states that the pain goes from her thumb into her arm up to her shoulder.

## 2024-10-06 NOTE — Progress Notes (Addendum)
 Acute Office Visit  Subjective:     Patient ID: Desiree Wilson, female    DOB: [birth year], 58 y.o.   MRN: [id number]  Chief Complaint  Patient presents with   Fall    HPI Patient is in today for   Discussed the use of AI scribe software for clinical note transcription with the patient, who gave verbal consent to proceed.  History of Present Illness Desiree Wilson is a 58 year old female who presents for follow-up after spinal cord stimulator implantation.  Postoperative status following spinal cord stimulator implantation for trigeminal neuralgia that she has been dealing with for years. - Recent spinal cord stimulator implantation with currently active device - Stimulator cannot be used while driving - Bandages removed; incision site slightly scabby without oozing, wants them looked at today.  - Palpable stimulator under the skin - Effective pain reduction during flare-ups with stimulator use  Musculoskeletal pain and functional limitations - Recent fall with hand used to break the fall, resulting in soreness of the thumb and hip - Soreness in shoulder and neck with palpable muscle knot - Difficulty with tasks such as putting dishes away and lifting mugs, with pain worsening by end of day - History of arthritis, particularly in the thumb joint - Frequent falls  Medication use and adjustments - Continues current medications including Klonopin  and a muscle relaxer, with reduced usage as needed - Discontinued Ketorolac  - Significantly reduced use of nasal spray, previously used up to ten times daily  Immunization status - Received influenza and pneumococcal vaccinations - Unable to receive COVID-19 booster due to prescription requirement, now aware of increased accessibility    ROS      Objective:    BP 137/83   Pulse 91   Ht 5' 5 (1.651 m)   Wt 215 lb 1.3 oz (97.6 kg)   SpO2 94%   BMI 35.79 kg/m    Physical Exam Vitals reviewed.  Constitutional:       Appearance: Normal appearance.  HENT:     Head: Normocephalic.  Pulmonary:     Effort: Pulmonary effort is normal.  Musculoskeletal:     Comments: Tender over the 2nd metacarpal on the right and. Some swelling over the 2nd and 3rd CMPs.  Slightly tender over right wrist.   Skin:    Comments: Incision over her right lower back is a little bit swollen where the device is but the incision looks clean dry and intact there is just a small scab in the center no active drainage.  The incision on her upper thoracic spine is healing really well.  Neurological:     Mental Status: She is alert and oriented to person, place, and time.  Psychiatric:        Mood and Affect: Mood normal.        Behavior: Behavior normal.     No results found for any visits on 10/06/24.      Assessment & Plan:   Problem List Items Addressed This Visit   None Visit Diagnoses       Encounter for immunization    -  Primary   Relevant Orders   Pfizer Comirnaty Covid-19 Vaccine 2yrs & older (Completed)     Primary osteoarthritis of first carpometacarpal joint of one hand, right       Relevant Orders   Ambulatory referral to Sports Medicine     Right hand pain       Relevant Orders   DG Hand  Complete Right     Fall, initial encounter         Acute pain of right shoulder       Relevant Orders   Ambulatory referral to Sports Medicine       Assessment and Plan Assessment & Plan Hand and wrist pain after fall Pain and tenderness suggest possible fracture or hyperextension injury. - Order x-ray of the hand and wrist to rule out fracture. - Refer to sports medicine specialist, Dr. Redell Ligas, for further evaluation if needed. - Advise wearing a supportive glove for compression if no fracture is found.  Shoulder and neck pain after fall Soreness and tightness in shoulder and neck with no fracture concern. - Advise range of motion exercises for shoulder and neck. -I do not think she needs any  imaging at this point for the shoulder. - Recommend neck stretches to alleviate tightness.  Osteoarthritis of right hand Chronic osteoarthritis with increased soreness post-fall. - She would like to get back in sports med in general she was getting injections.  Optimally with Dr. ONEIDA and so would like to establish with a new sports med doc.  Chronic pain syndrome status post spinal cord stimulator implantation Chronic pain managed with spinal cord stimulator, significant improvement noted, surgical site healing well. - Continue current medications as advised by pain management specialist.  General Health Maintenance Up to date with flu and pneumonia vaccinations, COVID-19 vaccination recommended. - Administer COVID-19 vaccine Proofreader) today.    No orders of the defined types were placed in this encounter.   No follow-ups on file.  Dorothyann Byars, MD

## 2024-10-07 ENCOUNTER — Ambulatory Visit

## 2024-10-07 ENCOUNTER — Other Ambulatory Visit: Payer: Self-pay

## 2024-10-07 VITALS — BP 120/88 | Ht 65.0 in | Wt 215.0 lb

## 2024-10-07 DIAGNOSIS — S62174A Nondisplaced fracture of trapezium [larger multangular], right wrist, initial encounter for closed fracture: Secondary | ICD-10-CM

## 2024-10-07 DIAGNOSIS — S46911A Strain of unspecified muscle, fascia and tendon at shoulder and upper arm level, right arm, initial encounter: Secondary | ICD-10-CM

## 2024-10-07 DIAGNOSIS — M1811 Unilateral primary osteoarthritis of first carpometacarpal joint, right hand: Secondary | ICD-10-CM | POA: Diagnosis not present

## 2024-10-07 NOTE — Addendum Note (Signed)
 Addended by: Glyndon Tursi A on: 10/07/2024 01:32 PM   Modules accepted: Orders

## 2024-10-07 NOTE — Progress Notes (Signed)
 Subjective:    Patient ID: Desiree Wilson, female    DOB: 58 y.o., 03/21/1966   MRN: [id number]  Chief Complaint: Right wrist and shoulder pain  Discussed the use of AI scribe software for clinical note transcription with the patient, who gave verbal consent to proceed.  Patient previously evaluated by Dr. Alvan on 10/06/2024 for hand/wrist pain following fall on 09/14/2024. Dr. Curtis after previous evaluation of her has planned a [facility] joint injection in her right hand.  She is interested in obtaining this today.  History of Present Illness  Desiree Wilson is a 58 year old female who presents with right wrist pain following a fall.  Right wrist pain - Severe pain localized at the right carpometacarpal [facility]) joint - Pain has progressively worsened over the past three weeks - Difficulty with thumb movements, especially when pushing with the side of the thumb - History of prior cast placement on the right wrist - Currently using a Velcro wrist brace - No significant relief with Tylenol  or tizanidine - Topical Voltaren  gel provides some relief  Right shoulder pain - Pain in the posterior aspect of the right shoulder for approximately two weeks - Pain occurs particularly after extensive arm use - No clicking, cracking, or squeaking in the shoulder - History of childhood shoulder dislocation - No recent dislocations or surgeries  Paresthesia of the right hand - Numbness and tingling present in the right hand  Review of pertinent imaging: 3 view plain film radiographs obtained of the right hand on 10/06/2024 per my independent review revealing some heterotopic ossification in the region of the [facility] joint and trapezium.  Degenerative changes of the [facility] joint.  Query the presence of a mostly transversely oriented fracture line through the trapezium with no appreciable displacement.  At the time of the writing of this note, there is no read by radiology on this x-ray.     Objective:   Vitals:   10/07/24 1126  BP: 120/88   Right hand: Mild swelling of right wrist greatest on the radial aspect.  No overlying skin changes.  Positive CMC grind.  Significant tenderness to palpation around the snuffbox, CMC joint, radial styloid.  Difficult to discern if there are distinct and separate points of tenderness in these areas or if the area itself is globally tender.   Right shoulder ( compared to normal ) Inspection: - swelling, + scapular dyskinesis Palpation: TTP - greater tuberosity, - AC joint, - biceps tendon, + posterior shoulder (palpable tender taut bands present in rhomboids and periscapular musculature of the posterior shoulder) AROM/PROM: 180 forward flexion, 180 abduction, 60 external rotation, internal rotation to lumbar spine Strength: 5/5 lift off, 5/5 empty can, 5/5 external rotation, 5/5 flexion, - drop arm test Special tests:    -Rotator Cuff: - Neer's, - Hawkin's, - empty can, - painful arc   -Biceps: - speed's, - yergason's    Right shoulder posterior musculature trigger point injection with Ultrasound Guidance Procedure Note Desiree Wilson [birth year] Indications: Pain Procedure Details Following the description of risks including infection, bleeding, and damage to surrounding structures patient provided written consent for right posterior shoulder musculature trigger point injection with ultrasound guidance. Ultrasound was used to identify the applicable trigger points in the posterior musculature of the shoulder. The patient was then prepped in the usual fashion with chlorhexidine. Following topical anesthetization with ethyl chloride, the patient was injected in 3 separate points into the right shoulder posterior musculature with a solution of 0.6  cc of 1% lidocaine  with 0.75 cc of 8.4% sodium HCO3. This was well visualized under ultrasound, please see associated photographic documentation. Patient tolerated well without complication.  Precautions provided. Cleaned and dressing applied.      Assessment & Plan:   Assessment & Plan Right wrist and thumb pain with possible carpometacarpal joint fracture Right wrist and thumb pain worsened after a fall on September 14, 2024. A possible carpometacarpal joint fracture is suspected, though not significantly displaced. Pain is exacerbated by thumb movements, especially downward motion. She does not desire surgical intervention. Order a radiology review of wrist and thumb x-rays to confirm fracture status. Fit with a thumb spica brace to immobilize the area and allow healing. Plan for repeat x-rays post-healing to confirm resolution. Consider injection at the carpometacarpal joint post-healing if the fracture is ruled out.  Right shoulder myofascial pain/strain Right shoulder myofascial pain/strain is likely due to compensatory movements from wrist and thumb pain. Pain has been present for approximately two weeks, primarily in the back of the shoulder, and is exacerbated by use and at night. Previous use of topical Voltaren  gel and NSAIDs provided some relief. Continue use of topical Voltaren  gel and NSAIDs for pain management. Perform a trigger point injection with local anesthetic to alleviate muscle spasm and pain, using ultrasound guidance for precise injection placement.

## 2024-10-08 ENCOUNTER — Ambulatory Visit: Payer: Self-pay | Admitting: Family Medicine

## 2024-10-08 NOTE — Progress Notes (Signed)
 Desiree Wilson, I see that you were able to get in with our sports med doc hopefully everything went well.  Just wanted to make sure that you got the note from him about the x-ray they saw a couple little calcified spots that could be fractures but they could also just be some calcification along the tendon so I think he wants you to repeat the x-ray in a couple of weeks.  If we start to see that they are healing then they are definitely fractures.  If they do not change then they are probably not and they have probably been there.

## 2024-10-12 ENCOUNTER — Encounter (HOSPITAL_BASED_OUTPATIENT_CLINIC_OR_DEPARTMENT_OTHER): Payer: Self-pay

## 2024-10-12 ENCOUNTER — Ambulatory Visit (HOSPITAL_BASED_OUTPATIENT_CLINIC_OR_DEPARTMENT_OTHER)
Admission: RE | Admit: 2024-10-12 | Discharge: 2024-10-12 | Disposition: A | Discharge status: Home / Self Care | Source: Ambulatory Visit | Attending: Family Medicine | Admitting: Family Medicine

## 2024-10-12 DIAGNOSIS — Z1231 Encounter for screening mammogram for malignant neoplasm of breast: Secondary | ICD-10-CM | POA: Diagnosis not present

## 2024-10-14 ENCOUNTER — Ambulatory Visit

## 2024-10-14 ENCOUNTER — Ambulatory Visit: Payer: Self-pay | Admitting: Family Medicine

## 2024-10-14 ENCOUNTER — Ambulatory Visit (HOSPITAL_BASED_OUTPATIENT_CLINIC_OR_DEPARTMENT_OTHER)
Admission: RE | Admit: 2024-10-14 | Discharge: 2024-10-14 | Disposition: A | Discharge status: Home / Self Care | Source: Ambulatory Visit

## 2024-10-14 VITALS — BP 138/88 | Ht 65.0 in | Wt 215.0 lb

## 2024-10-14 DIAGNOSIS — S46012A Strain of muscle(s) and tendon(s) of the rotator cuff of left shoulder, initial encounter: Secondary | ICD-10-CM

## 2024-10-14 DIAGNOSIS — M25512 Pain in left shoulder: Secondary | ICD-10-CM

## 2024-10-14 DIAGNOSIS — M19012 Primary osteoarthritis, left shoulder: Secondary | ICD-10-CM | POA: Diagnosis not present

## 2024-10-14 MED ORDER — METHYLPREDNISOLONE ACETATE 40 MG/ML IJ SUSP
40.0000 mg | Freq: Once | INTRAMUSCULAR | Status: AC
  Administered 2024-10-14: 40 mg via INTRA_ARTICULAR
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
## 2024-10-14 NOTE — Progress Notes (Signed)
 Please call patient. Normal mammogram.  Repeat in 1 year.

## 2024-10-17 DIAGNOSIS — G894 Chronic pain syndrome: Secondary | ICD-10-CM | POA: Diagnosis not present

## 2024-10-17 DIAGNOSIS — Z79891 Long term (current) use of opiate analgesic: Secondary | ICD-10-CM | POA: Diagnosis not present

## 2024-10-17 DIAGNOSIS — Z79899 Other long term (current) drug therapy: Secondary | ICD-10-CM | POA: Diagnosis not present

## 2024-10-21 ENCOUNTER — Encounter: Payer: Self-pay | Admitting: Family Medicine

## 2024-10-21 ENCOUNTER — Ambulatory Visit

## 2024-10-21 DIAGNOSIS — F325 Major depressive disorder, single episode, in full remission: Secondary | ICD-10-CM

## 2024-10-21 MED ORDER — DULOXETINE HCL 60 MG PO CPEP
60.0000 mg | ORAL_CAPSULE | Freq: Two times a day (BID) | ORAL | 1 refills | Status: AC
Start: 2024-10-21 — End: ?

## 2024-10-22 ENCOUNTER — Ambulatory Visit: Admitting: Psychology

## 2024-10-22 DIAGNOSIS — F331 Major depressive disorder, recurrent, moderate: Secondary | ICD-10-CM | POA: Diagnosis not present

## 2024-10-22 DIAGNOSIS — F411 Generalized anxiety disorder: Secondary | ICD-10-CM | POA: Diagnosis not present

## 2024-10-22 NOTE — Progress Notes (Signed)
 [facility] Counselor/Therapist Progress Note  Patient ID: Desiree Wilson, MRN: [id number],    Date: 10/22/2024  Time Spent: 9:00am-9:55am   55 minutes   Treatment Type: Individual Therapy  Reported Symptoms: sadness, depression  Mental Status Exam: Appearance:  Casual     Behavior: Appropriate  Motor: Normal  Speech/Language:  Normal Rate  Affect: Appropriate  Mood: normal  Thought process: normal  Thought content:   WNL  Sensory/Perceptual disturbances:   WNL  Orientation: oriented to person, place, time/date, and situation  Attention: Good  Concentration: Good  Memory: WNL  Fund of knowledge:  Good  Insight:   Good  Judgment:  Good  Impulse Control: Good   Risk Assessment: Danger to Self:  No Self-injurious Behavior: No Danger to Others: No Duty to Warn:no Physical Aggression / Violence:No  Access to Firearms a concern: No  Gang Involvement:No   Subjective: Pt present for face-to-face individual therapy via video.  Pt consents to telehealth video session and is aware of limitations and benefits of virtual sessions.  Location of pt: home Location of therapist: home office.   Pt talked about having the surgery at [facility] where they place the stimulator.  The stimulator has helped a lot with pt's pain but the device is sticking out of her skin and needs to be fixed with another surgery.  This is frustrating for pt.  Pt feels like this is bringing up the trauma of past surgeries.   Pt has had significant surgery complications in the past.  Addressed pt's memories and helped her process her feelings.  Pt states she is feeling depressed.  She is staying in bed a lot and has trouble feeling motivated to do things such as take a shower.   Worked with pt on behavioral activation strategies and pt agreed to set the intention to take a shower today and stay out of bed.   Pt called her MD this week and her antidepressant was increased so she hopes that will help.   Pt worries a lot about finances.   Addressed pt's financial situation and how she feels stuck and bad about herself that she has to ask her parents for money.  Worked on problem solving.   Worked on self compassion and giving herself grace. Worked on self care strategies. Provided supportive therapy.  Interventions: Cognitive Behavioral Therapy and Insight-Oriented  Diagnosis:  F33.1 and F41.1    Plan of Care: Recommend ongoing therapy.   Pt participated with setting treatment goals.   Pt states her goals for therapy are to have a place to process her feelings.   Pt wants to improve coping skills.  Plan to meet every month.  Pt agrees with treatment plan.  Treatment Plan (Treatment Plan Target Date: 04/16/2025) Client Abilities/Strengths  Pt is bright, engaging, and motivated for therapy.  Client Treatment Preferences  Individual therapy.  Client Statement of Needs  Improve copings skills and have a safe place to talk about feelings.   Symptoms  Depressed or irritable mood. Excessive and/or unrealistic worry that is difficult to control occurring more days than not for at least 6 months about a number of events or activities. Hypervigilance (e.g., feeling constantly on edge, experiencing concentration difficulties, having trouble falling or staying asleep, exhibiting a general state of irritability).  Problems Addressed  Unipolar Depression, Anxiety Goals 1. Alleviate depressive symptoms and return to previous level of effective functioning. 2. Appropriately grieve the loss in order to normalize mood and to return to previously  adaptive level of functioning. Objective Learn and implement behavioral strategies to overcome depression. Target Date: 2025-04-16 Frequency: Monthly  Progress: 45 Modality: individual  Related Interventions Assist the client in developing skills that increase the likelihood of deriving pleasure from behavioral activation (e.g., assertiveness skills, developing  an exercise plan, less internal/more external focus, increased social involvement); reinforce success. Engage the client in behavioral activation, increasing his/her activity level and contact with sources of reward, while identifying processes that inhibit activation. use behavioral techniques such as instruction, rehearsal, role-playing, role reversal, as needed, to facilitate activity in the client's daily life; reinforce success. 3. Develop healthy interpersonal relationships that lead to the alleviation and help prevent the relapse of depression. 4. Develop healthy thinking patterns and beliefs about self, others, and the world that lead to the alleviation and help prevent the relapse of depression. 5. Enhance ability to effectively cope with the full variety of life's worries and anxieties. 6. Learn and implement coping skills that result in a reduction of anxiety and worry, and improved daily functioning. Objective Learn and implement problem-solving strategies for realistically addressing worries. Target Date: 2025-04-16 Frequency: Monthly  Progress: 45 Modality: individual  Related Interventions Assign the client a homework exercise in which he/she problem-solves a current problem (see Mastery of Your Anxiety and Worry: Workbook by Richarda and Jonne or Generalized Anxiety Disorder by Delores Filler, and Jonne); review, reinforce success, and provide corrective feedback toward improvement. Teach the client problem-solving strategies involving specifically defining a problem, generating options for addressing it, evaluating the pros and cons of each option, selecting and implementing an optional action, and reevaluating and refining the action. Objective Learn and implement calming skills to reduce overall anxiety and manage anxiety symptoms. Target Date: 2025-04-16 Frequency: Monthly  Progress: 45 Modality: individual  Related Interventions Assign the client to read about progressive  muscle relaxation and other calming strategies in relevant books or treatment manuals (e.g., Progressive Relaxation Training by Thornell armin Collier; Mastery of Your Anxiety and Worry: Workbook by Richarda armin Jonne). Assign the client homework each session in which he/she practices relaxation exercises daily, gradually applying them progressively from non-anxiety-provoking to anxiety-provoking situations; review and reinforce success while providing corrective feedback toward improvement. Teach the client calming/relaxation skills (e.g., applied relaxation, progressive muscle relaxation, cue controlled relaxation; mindful breathing; biofeedback) and how to discriminate better between relaxation and tension; teach the client how to apply these skills to his/her daily life. 7. Recognize, accept, and cope with feelings of depression. 8. Reduce overall frequency, intensity, and duration of the anxiety so that daily functioning is not impaired. 9. Resolve the core conflict that is the source of anxiety. 10. Stabilize anxiety level while increasing ability to function on a daily basis. Diagnosis Axis none F33.1 Major Depressive Disorder  Axis none F41.1 General Anxiety Disorder  Conditions For Discharge Achievement of treatment goals and objectives   Veva Alma, LCSW

## 2024-10-25 ENCOUNTER — Ambulatory Visit: Payer: Self-pay

## 2024-10-26 ENCOUNTER — Ambulatory Visit (HOSPITAL_BASED_OUTPATIENT_CLINIC_OR_DEPARTMENT_OTHER)
Admission: RE | Admit: 2024-10-26 | Discharge: 2024-10-26 | Disposition: A | Discharge status: Home / Self Care | Source: Ambulatory Visit

## 2024-10-26 ENCOUNTER — Ambulatory Visit

## 2024-10-26 VITALS — BP 120/82 | Ht 65.0 in | Wt 215.0 lb

## 2024-10-26 DIAGNOSIS — M1811 Unilateral primary osteoarthritis of first carpometacarpal joint, right hand: Secondary | ICD-10-CM | POA: Insufficient documentation

## 2024-10-26 DIAGNOSIS — M79641 Pain in right hand: Secondary | ICD-10-CM | POA: Diagnosis not present

## 2024-10-26 DIAGNOSIS — S62174A Nondisplaced fracture of trapezium [larger multangular], right wrist, initial encounter for closed fracture: Secondary | ICD-10-CM | POA: Diagnosis not present

## 2024-10-26 DIAGNOSIS — M79644 Pain in right finger(s): Secondary | ICD-10-CM | POA: Diagnosis not present

## 2024-10-26 DIAGNOSIS — M2580 Other specified joint disorders, unspecified joint: Secondary | ICD-10-CM | POA: Diagnosis not present

## 2024-10-26 DIAGNOSIS — W19XXXD Unspecified fall, subsequent encounter: Secondary | ICD-10-CM | POA: Diagnosis not present

## 2024-10-26 DIAGNOSIS — M25841 Other specified joint disorders, right hand: Secondary | ICD-10-CM | POA: Diagnosis not present

## 2024-10-26 NOTE — Progress Notes (Signed)
 Subjective:    Patient ID: Desiree Wilson, female    DOB: 58 y.o., 08/13/1966   MRN: [id number]  Chief Complaint: Right wrist and thumb pain  Discussed the use of AI scribe software for clinical note transcription with the patient, who gave verbal consent to proceed.  Patient originally presented to her PCP on 10/06/2024 reporting a recent fall where she used her right hand to break the fall and as a result has had soreness and pain in thumb area.  History of Present Illness Desiree Wilson is a 58 year old female with a history of arthritis in the thumb joint who presents with persistent thumb pain following a fall.  Thumb pain and functional limitation - Persistent pain in the right thumb since a fall on a tile floor on September 15th - Pain localized to the carpometacarpal [facility]) joint and trapezium area - Tenderness with finger abduction and wrist elevation - Pain exacerbated by removal of the brace for handwashing - Pain limits ability to perform certain activities, impacting daily life  Immobilization and supportive measures - Regular use of a thumb brace for immobilization despite discomfort - Brace removal only for handwashing  Imaging findings - Initial x-rays demonstrated possible fractures and soft tissue calcification, suggestive of chronic injury or bone fragments - Repeat x-ray on October 27th revealed calcium  fragments and new hazy areas  Pre-existing joint disease - History of arthritis in the affected thumb joint, previously managed  Barriers to care - Delayed seeking care due to surgery on September 18th, which limited ability to drive  Review of pertinent imaging: 3 view plain film radiographs obtained of the right hand on 10/06/2024 per my dependent review revealing small calcific fragments present on the palmar aspect of the [facility] joint adjacent to the trapezium and at the base of the first metacarpal.  Potential fracture line present traversing into the  trapezium on oblique view but this is not clearly visualized on other views.     Objective:   There were no vitals filed for this visit.  Right hand/Fingers/Wrist ( compared to normal ) -Inspection: no swelling, erythema, ecchymoses. No bony deformity or atrophy of the hypothenar region -Palpation: TTP - DIP, - PIP, - MCP,  + tenderness palpation over the trapezium, snuffbox, [facility]MC joint. -AROM/PROM: Limited range of motion particularly with extension and flexion of the thumb.  Full range of motion of the wrist with extension and flexion. -Strength: 5/5 flexion, 5/5 extension (radial), 5/5 pronation, 5/5 supination, 5/5 finger abduction (ulnar), 5/5 Ok sign (median) -sensation: intact on dorsum of hand (radial), 4th/5th digits (ulnar), 1st-3rd digits (median)    Assessment & Plan:   Assessment & Plan Suspected right trapezium fracture   There is concern for a fracture of the right trapezium following a fall on September 15th. Initial x-rays indicated potential fractures and soft tissue calcification, suggesting chronic injury or bone fragments. Recent x-rays including x-rays from today per my independent evaluation and review reveal calcium  fragments and possible bone resorption, consistent with a fracture. The fracture is subtle and not significantly displaced. Order a CT scan of the right wrist to confirm the fracture. Continue immobilization with the current brace until further evaluation.  Right thumb carpometacarpal joint osteoarthritis   Osteoarthritis is present in the right thumb carpometacarpal joint, causing pain and discomfort. This chronic condition is likely worsened by the recent fall.  Right thumb and wrist pain   Persistent pain in the right thumb and wrist is likely due to  the suspected trapezium fracture and existing osteoarthritis. Pain increases with movement and pressure. Continue using the brace to immobilize the thumb and wrist, allowing removal for hygiene purposes such  as showering.

## 2024-10-28 ENCOUNTER — Ambulatory Visit (HOSPITAL_BASED_OUTPATIENT_CLINIC_OR_DEPARTMENT_OTHER)
Admission: RE | Admit: 2024-10-28 | Discharge: 2024-10-28 | Disposition: A | Discharge status: Home / Self Care | Source: Ambulatory Visit

## 2024-10-28 DIAGNOSIS — M1811 Unilateral primary osteoarthritis of first carpometacarpal joint, right hand: Secondary | ICD-10-CM | POA: Diagnosis not present

## 2024-10-28 DIAGNOSIS — M79644 Pain in right finger(s): Secondary | ICD-10-CM | POA: Diagnosis not present

## 2024-10-28 DIAGNOSIS — W19XXXA Unspecified fall, initial encounter: Secondary | ICD-10-CM | POA: Diagnosis not present

## 2024-10-28 DIAGNOSIS — W19XXXD Unspecified fall, subsequent encounter: Secondary | ICD-10-CM

## 2024-10-28 DIAGNOSIS — S63041A Subluxation of carpometacarpal joint of right thumb, initial encounter: Secondary | ICD-10-CM | POA: Insufficient documentation

## 2024-10-30 ENCOUNTER — Ambulatory Visit: Payer: Self-pay

## 2024-10-30 NOTE — Progress Notes (Signed)
 Prominent degenerative changes and calcifications adjacent to the first [facility] joint of the hand on CT scan.  No trapezium fracture evident on CT scan.  Called patient on 10/30/2004 at 5:15 PM discussing the results of her CT scan with her and offering option to pursue steroid injection in first [facility] joint.  Patient reports she understands the findings reiterated by myself, and plans to call my scheduler on Monday to arrange for follow-up appointment.  Redell Robes, DO CAQSM

## 2024-11-02 ENCOUNTER — Ambulatory Visit

## 2024-11-03 ENCOUNTER — Ambulatory Visit (INDEPENDENT_AMBULATORY_CARE_PROVIDER_SITE_OTHER)

## 2024-11-03 ENCOUNTER — Other Ambulatory Visit (INDEPENDENT_AMBULATORY_CARE_PROVIDER_SITE_OTHER): Payer: Self-pay

## 2024-11-03 VITALS — BP 120/80 | Ht 65.0 in | Wt 215.0 lb

## 2024-11-03 DIAGNOSIS — M1811 Unilateral primary osteoarthritis of first carpometacarpal joint, right hand: Secondary | ICD-10-CM | POA: Diagnosis not present

## 2024-11-03 MED ORDER — METHYLPREDNISOLONE ACETATE 40 MG/ML IJ SUSP
40.0000 mg | Freq: Once | INTRAMUSCULAR | Status: AC
  Administered 2024-11-03: 20 mg via INTRA_ARTICULAR

## 2024-11-03 NOTE — Progress Notes (Signed)
   Subjective:    Patient ID: ADRIENNE TROMBETTA, female    DOB: 58 y.o., [birth year]   MRN: [id number]  Chief Complaint: Right first [facility] arthritis - steroid injection visit  History of Present Illness   Patient endorsing mild improvement with use of the brace.  She is very encouraged that she does not have a fracture in the trapezium (ruled out by 10/28/24 CT scan).  Interested in injection of the [facility] joint today.     Objective:   There were no vitals filed for this visit.  Right 1st [facility] Joint Injection with Ultrasound Guidance SIMORA DINGEE 09/20/1966 Indications: Pain Procedure Details Following the description of risks including infection bleeding, damage to surrounding structures, patient provided verbal/written consent for Right [facility]st [facility] Joint injection procedure with ultrasound guidance. Ultrasound was used to identify the [facility] joint and was used to guide this procedure. Patient was sterilely prepped in the usual fashion with chlorhexidine. Sterile ultrasound gel was used for the procedure. Following topical anesthetization with ethyl chloride, the patient was injected into the joint with a solution of 20mg  Depo-Medrol and 1cc 2% Mepivicaine. This was well visualized under ultrasound, please see associated photographic documentation. Patient tolerated well without complication. Precautions provided. Cleaned and dressing applied.     Assessment & Plan:   Assessment & Plan  Patient tolerated injection well. Continued use of the brace as needed for the next several days to minimize significant motion at the joint and allow for steroid injection to kick in. Plan for follow-up as needed.

## 2024-11-04 DIAGNOSIS — F4310 Post-traumatic stress disorder, unspecified: Secondary | ICD-10-CM | POA: Diagnosis not present

## 2024-11-04 DIAGNOSIS — F339 Major depressive disorder, recurrent, unspecified: Secondary | ICD-10-CM | POA: Diagnosis not present

## 2024-11-04 DIAGNOSIS — F17200 Nicotine dependence, unspecified, uncomplicated: Secondary | ICD-10-CM | POA: Diagnosis not present

## 2024-11-04 DIAGNOSIS — F411 Generalized anxiety disorder: Secondary | ICD-10-CM | POA: Diagnosis not present

## 2024-11-19 ENCOUNTER — Ambulatory Visit: Admitting: Psychology

## 2024-11-19 DIAGNOSIS — F411 Generalized anxiety disorder: Secondary | ICD-10-CM

## 2024-11-19 DIAGNOSIS — F331 Major depressive disorder, recurrent, moderate: Secondary | ICD-10-CM

## 2024-11-19 NOTE — Progress Notes (Signed)
 [facility] Counselor/Therapist Progress Note  Patient ID: Desiree Wilson, MRN: [id number],    Date: 11/19/2024  Time Spent: 9:00am-9:55am   55 minutes   Treatment Type: Individual Therapy  Reported Symptoms: stress, worry  Mental Status Exam: Appearance:  Casual     Behavior: Appropriate  Motor: Normal  Speech/Language:  Normal Rate  Affect: Appropriate  Mood: normal  Thought process: normal  Thought content:   WNL  Sensory/Perceptual disturbances:   WNL  Orientation: oriented to person, place, time/date, and situation  Attention: Good  Concentration: Good  Memory: WNL  Fund of knowledge:  Good  Insight:   Good  Judgment:  Good  Impulse Control: Good   Risk Assessment: Danger to Self:  No Self-injurious Behavior: No Danger to Others: No Duty to Warn:no Physical Aggression / Violence:No  Access to Firearms a concern: No  Gang Involvement:No   Subjective: Pt present for face-to-face individual therapy via video.  Pt consents to telehealth video session and is aware of limitations and benefits of virtual sessions.  Location of pt: home Location of therapist: home office.   Pt talked about her pain stimulator.  It is helping some with the pain but needs to be adjusted.   Last week pt had a really bad pain flare.  Pt will see her doctor at [facility] on December 3rd to address the issues.  Pt talked about the holidays.  She will have Thanksgiving at her parents' house.  Pt is making most of her Christmas gifts.  She is very creative and enjoys working on projects.  She is grateful she has the energy to do that now.   Pt has been helping arrange for her mother in law to move to [place] so she can be closer to pt and her husband.   Pt states the work to arrange for the move has been stressful.   Worked on self care strategies. Provided supportive therapy.  Interventions: Cognitive Behavioral Therapy and Insight-Oriented  Diagnosis:  F33.1 and F41.1    Plan of  Care: Recommend ongoing therapy.   Pt participated with setting treatment goals.   Pt states her goals for therapy are to have a place to process her feelings.   Pt wants to improve coping skills.  Plan to meet every month.  Pt agrees with treatment plan.  Treatment Plan (Treatment Plan Target Date: 04/16/2025) Client Abilities/Strengths  Pt is bright, engaging, and motivated for therapy.  Client Treatment Preferences  Individual therapy.  Client Statement of Needs  Improve copings skills and have a safe place to talk about feelings.   Symptoms  Depressed or irritable mood. Excessive and/or unrealistic worry that is difficult to control occurring more days than not for at least 6 months about a number of events or activities. Hypervigilance (e.g., feeling constantly on edge, experiencing concentration difficulties, having trouble falling or staying asleep, exhibiting a general state of irritability).  Problems Addressed  Unipolar Depression, Anxiety Goals 1. Alleviate depressive symptoms and return to previous level of effective functioning. 2. Appropriately grieve the loss in order to normalize mood and to return to previously adaptive level of functioning. Objective Learn and implement behavioral strategies to overcome depression. Target Date: 2025-04-16 Frequency: Monthly  Progress: 45 Modality: individual  Related Interventions Assist the client in developing skills that increase the likelihood of deriving pleasure from behavioral activation (e.g., assertiveness skills, developing an exercise plan, less internal/more external focus, increased social involvement); reinforce success. Engage the client in behavioral activation, increasing his/her  activity level and contact with sources of reward, while identifying processes that inhibit activation. use behavioral techniques such as instruction, rehearsal, role-playing, role reversal, as needed, to facilitate activity in the client's daily  life; reinforce success. 3. Develop healthy interpersonal relationships that lead to the alleviation and help prevent the relapse of depression. 4. Develop healthy thinking patterns and beliefs about self, others, and the world that lead to the alleviation and help prevent the relapse of depression. 5. Enhance ability to effectively cope with the full variety of life's worries and anxieties. 6. Learn and implement coping skills that result in a reduction of anxiety and worry, and improved daily functioning. Objective Learn and implement problem-solving strategies for realistically addressing worries. Target Date: 2025-04-16 Frequency: Monthly  Progress: 45 Modality: individual  Related Interventions Assign the client a homework exercise in which he/she problem-solves a current problem (see Mastery of Your Anxiety and Worry: Workbook by Richarda and Jonne or Generalized Anxiety Disorder by Delores Filler, and Jonne); review, reinforce success, and provide corrective feedback toward improvement. Teach the client problem-solving strategies involving specifically defining a problem, generating options for addressing it, evaluating the pros and cons of each option, selecting and implementing an optional action, and reevaluating and refining the action. Objective Learn and implement calming skills to reduce overall anxiety and manage anxiety symptoms. Target Date: 2025-04-16 Frequency: Monthly  Progress: 45 Modality: individual  Related Interventions Assign the client to read about progressive muscle relaxation and other calming strategies in relevant books or treatment manuals (e.g., Progressive Relaxation Training by Thornell armin Collier; Mastery of Your Anxiety and Worry: Workbook by Richarda armin Jonne). Assign the client homework each session in which he/she practices relaxation exercises daily, gradually applying them progressively from non-anxiety-provoking to anxiety-provoking situations; review  and reinforce success while providing corrective feedback toward improvement. Teach the client calming/relaxation skills (e.g., applied relaxation, progressive muscle relaxation, cue controlled relaxation; mindful breathing; biofeedback) and how to discriminate better between relaxation and tension; teach the client how to apply these skills to his/her daily life. 7. Recognize, accept, and cope with feelings of depression. 8. Reduce overall frequency, intensity, and duration of the anxiety so that daily functioning is not impaired. 9. Resolve the core conflict that is the source of anxiety. 10. Stabilize anxiety level while increasing ability to function on a daily basis. Diagnosis Axis none F33.1 Major Depressive Disorder  Axis none F41.1 General Anxiety Disorder  Conditions For Discharge Achievement of treatment goals and objectives   Veva Alma, LCSW

## 2024-11-25 ENCOUNTER — Encounter: Payer: Self-pay | Admitting: Family Medicine

## 2024-11-25 NOTE — Telephone Encounter (Signed)
 Needs an appointment.

## 2024-11-25 NOTE — Telephone Encounter (Signed)
 Patient informed . No availability in our office due to being closed for Thanksgiving. Patient will go to urgent care.

## 2024-11-29 ENCOUNTER — Telehealth: Admitting: Family

## 2024-11-29 DIAGNOSIS — J209 Acute bronchitis, unspecified: Secondary | ICD-10-CM

## 2024-11-29 MED ORDER — ALBUTEROL SULFATE HFA 108 (90 BASE) MCG/ACT IN AERS: 2.0000 | INHALATION_SPRAY | Freq: Four times a day (QID) | RESPIRATORY_TRACT | 0 refills | Status: DC | PRN

## 2024-11-29 MED ORDER — PROMETHAZINE-DM 6.25-15 MG/5ML PO SYRP: 5.0000 mL | ORAL_SOLUTION | Freq: Three times a day (TID) | ORAL | 0 refills | Status: DC | PRN

## 2024-11-29 MED ORDER — PREDNISONE 10 MG (21) PO TBPK: ORAL_TABLET | ORAL | 0 refills | Status: DC

## 2024-11-29 NOTE — Progress Notes (Signed)
 Virtual Visit Consent   Desiree Wilson, you are scheduled for a virtual visit with a [facility] provider today. Just as with appointments in the office, your consent must be obtained to participate. Your consent will be active for this visit and any virtual visit you may have with one of our providers in the next 365 days. If you have a MyChart account, a copy of this consent can be sent to you electronically.  As this is a virtual visit, video technology does not allow for your provider to perform a traditional examination. This may limit your provider's ability to fully assess your condition. If your provider identifies any concerns that need to be evaluated in person or the need to arrange testing (such as labs, EKG, etc.), we will make arrangements to do so. Although advances in technology are sophisticated, we cannot ensure that it will always work on either your end or our end. If the connection with a video visit is poor, the visit may have to be switched to a telephone visit. With either a video or telephone visit, we are not always able to ensure that we have a secure connection.  By engaging in this virtual visit, you consent to the provision of healthcare and authorize for your insurance to be billed (if applicable) for the services provided during this visit. Depending on your insurance coverage, you may receive a charge related to this service.  I need to obtain your verbal consent now. Are you willing to proceed with your visit today? Anjelita K Gloor has provided verbal consent on 11/29/2024 for a virtual visit (video or telephone). Bari Learn, FNP  Date: 11/29/2024 9:32 AM   Virtual Visit via Video Note   I, Bari Learn, connected with  Desiree Wilson  ([id number], 05-20-1966) on 11/29/24 at  9:30 AM EST by a video-enabled telemedicine application and verified that I am speaking with the correct person using two identifiers.  Location: Patient: Virtual Visit Location  Patient: Home Provider: Virtual Visit Location Provider: Home Office   I discussed the limitations of evaluation and management by telemedicine and the availability of in person appointments. The patient expressed understanding and agreed to proceed.    History of Present Illness: Desiree Wilson is a 58 y.o. who identifies as a female who was assigned female at birth, and is being seen today for cough that started three days ago.  HPI: Cough This is a new problem. The current episode started in the past 7 days. The problem has been unchanged. The problem occurs every few minutes. The cough is Non-productive. Associated symptoms include chills, ear congestion, headaches, nasal congestion, postnasal drip, shortness of breath and wheezing. Pertinent negatives include no ear pain, fever, myalgias or sore throat. She has tried rest and OTC cough suppressant for the symptoms. The treatment provided mild relief.    Problems:  Patient Active Problem List   Diagnosis Date Noted   Numbness and tingling of upper and lower extremities of both sides 01/23/2024   IFG (impaired fasting glucose) 10/28/2023   Left wrist pain 10/08/2023   Moderate episode of recurrent major depressive disorder (HCC) 05/06/2023   Trigger thumb, right thumb 05/03/2023   Primary osteoarthritis of both first carpometacarpal joints 03/07/2023   Lateral epicondylitis, right elbow 02/11/2023   De Quervain's tenosynovitis, left 07/25/2022   Low libido 02/14/2022   Vaginal atrophy 02/14/2022   Prediabetes 05/30/2021   Low serum ferritin level 05/30/2021   B12 deficiency 05/30/2021   Chronic  pain syndrome 05/30/2021   Class 1 obesity due to excess calories with body mass index (BMI) of 32.0 to 32.9 in adult 05/30/2021   Primary insomnia 02/02/2021   Snoring 10/26/2020   Menorrhagia 06/10/2020   Status post gamma knife treatment 06/19/2018   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   Atypical facial pain 01/15/2018   S/P craniotomy  01/15/2018   Chronic diarrhea 07/15/2017   Deafness in right ear 07/12/2016   Gastroesophageal reflux disease with esophagitis 06/11/2016   Vitamin D  deficiency 06/06/2016   Myofascial pain 02/29/2016   Abnormal brain MRI 07/18/2015   Chronic insomnia 12/16/2014   Iron deficiency anemia 11/15/2014   Iron malabsorption 11/15/2014   CKD (chronic kidney disease) stage 2, GFR 60-89 ml/min 08/26/2014   Chronic migraine without aura, intractable, without status migrainosus 01/25/2014   Trigeminal neuralgia 07/22/2012   Primary hypertension 04/13/2011   ANKLE PAIN, LEFT 03/06/2011   FATIGUE 08/10/2010   HYPERSOMNIA 08/09/2009   Hyperlipidemia 07/05/2009   Depression 07/05/2009   Allergic rhinitis 07/05/2009   Osteoarthritis 07/05/2009   Headache 07/05/2009    Allergies:  Allergies  Allergen Reactions   Azithromycin Rash    REACTION: C Diff Other reaction(s): Other (See Comments), Other (See Comments) REACTION: C Diff REACTION: C Diff REACTION: C Diff    Methadone     Other reaction(s): Hallucinations, Other (See Comments), Other (See Comments) Hallucinations Hallucinations Hallucinations    Tapentadol Anxiety    Other reaction(s): Dizziness   Phenobarbital     REACTION: Excited   Wellbutrin  [Bupropion ] Other (See Comments)    Not effective.    Medications:  Current Outpatient Medications:    albuterol  (VENTOLIN  HFA) 108 (90 Base) MCG/ACT inhaler, Inhale 2 puffs into the lungs every 6 (six) hours as needed for wheezing or shortness of breath., Disp: 8 g, Rfl: 0   predniSONE  (STERAPRED UNI-PAK 21 TAB) 10 MG (21) TBPK tablet, Use as directed, Disp: 21 tablet, Rfl: 0   promethazine -dextromethorphan (PROMETHAZINE -DM) 6.25-15 MG/5ML syrup, Take 5 mLs by mouth 3 (three) times daily as needed for cough., Disp: 118 mL, Rfl: 0   albuterol  (VENTOLIN  HFA) 108 (90 Base) MCG/ACT inhaler, Inhale 2 puffs into the lungs every 6 (six) hours as needed., Disp: 18 g, Rfl: 0   AMBULATORY NON  FORMULARY MEDICATION, Ketamine nose spray: use 2 sprays prn pain, up to 10 sprays a day, Disp: , Rfl:    AMBULATORY NON FORMULARY MEDICATION, Medication Name: Align Probiotic, Disp: 1 Bottle, Rfl: 11   AMBULATORY NON FORMULARY MEDICATION, Medication Name: COVID vaccine for 25-26 season.   Dx: Chronic kidney disease, Disp: 1 Units, Rfl: 0   amLODipine  (NORVASC ) 10 MG tablet, Take 1 tablet (10 mg total) by mouth daily., Disp: 90 tablet, Rfl: 0   buprenorphine (BUTRANS) 20 MCG/HR PTWK, Place 1 patch onto the skin once a week., Disp: , Rfl:    Carboxymethylcellulose Sodium 1 % GEL, Apply to eye as needed., Disp: , Rfl:    celecoxib  (CELEBREX ) 200 MG capsule, One to 2 tablets by mouth daily as needed for pain., Disp: 60 capsule, Rfl: 2   clonazePAM  (KLONOPIN ) 1 MG tablet, Take 1 mg by mouth 3 (three) times daily as needed for anxiety., Disp: , Rfl:    colestipol  (COLESTID ) 1 g tablet, Take 1 tablet (1 g total) by mouth 2 (two) times daily., Disp: 180 tablet, Rfl: 1   dicyclomine  (BENTYL ) 10 MG capsule, Take 1 capsule (10 mg total) by mouth every 8 (eight) hours as needed for  spasms., Disp: 30 capsule, Rfl: 1   DULoxetine  (CYMBALTA ) 60 MG capsule, Take 1 capsule (60 mg total) by mouth 2 (two) times daily., Disp: 180 capsule, Rfl: 1   estradiol  (ESTRACE ) 0.1 MG/GM vaginal cream, Place 1 Applicatorful vaginally at bedtime. X 7 days, then 3 x a week, Disp: 85 g, Rfl: 3   ferrous sulfate 325 (65 FE) MG tablet, Take 325 mg by mouth 2 (two) times daily with a meal., Disp: , Rfl:    fluticasone  (FLONASE ) 50 MCG/ACT nasal spray, Place 1 spray into both nostrils daily., Disp: 48 g, Rfl: 1   folic acid (FOLVITE) 1 MG tablet, Take 1 mg by mouth daily., Disp: , Rfl:    frovatriptan  (FROVA ) 2.5 MG tablet, Take 1 tablet (2.5 mg total) by mouth daily as needed., Disp: 10 tablet, Rfl: 1   ketorolac  (TORADOL ) 10 MG tablet, Take 10 mg by mouth as needed., Disp: , Rfl:    Misc. Devices MISC, Dispense central line dressing  kit includes One pair sterile gloves One ChloraPrep  One-Step applicator, 3mL One isopropyl alcohol swabstick One 2 x 2 gauze dressing One 4 x 4 gauze dressing One roll medical tape, Disp: , Rfl:    Multiple Vitamin (MULTIVITAMIN) capsule, Take 1 capsule by mouth daily., Disp: , Rfl:    omeprazole  (PRILOSEC) 40 MG capsule, TAKE 1 CAPSULE BY MOUTH DAILY, Disp: 90 capsule, Rfl: 3   Oxcarbazepine (TRILEPTAL) 300 MG tablet, Take 300 mg by mouth 2 (two) times daily., Disp: , Rfl:    pregabalin  (LYRICA ) 50 MG capsule, TAKE 1 CAPSULE(50 MG) BY MOUTH THREE TIMES DAILY, Disp: 90 capsule, Rfl: 3   Probiotic Product (PROBIOTIC PO), Take by mouth., Disp: , Rfl:    rosuvastatin  (CRESTOR ) 20 MG tablet, Take 1 tablet (20 mg total) by mouth daily., Disp: 90 tablet, Rfl: 3   tiZANidine (ZANAFLEX) 4 MG tablet, Take 4 mg by mouth every 6 (six) hours as needed., Disp: , Rfl:    traZODone  (DESYREL ) 100 MG tablet, TAKE 2 TABLETS(200 MG) BY MOUTH AT BEDTIME AS NEEDED FOR SLEEP, Disp: 180 tablet, Rfl: 1   Turmeric (QC TUMERIC COMPLEX PO), Take by mouth., Disp: , Rfl:    vitamin B-12 (CYANOCOBALAMIN ) 1000 MCG tablet, Take 1,000 mcg by mouth daily., Disp: , Rfl:    vitamin C (ASCORBIC ACID) 500 MG tablet, Take 500 mg by mouth daily., Disp: , Rfl:   Observations/Objective: Patient is well-developed, well-nourished in no acute distress.  Resting comfortably  at home.  Head is normocephalic, atraumatic.  No labored breathing.  Speech is clear and coherent with logical content.  Patient is alert and oriented at baseline.  Dry coarse nonproductive cough  Assessment and Plan: 1. Acute bronchitis, unspecified organism (Primary) - albuterol  (VENTOLIN  HFA) 108 (90 Base) MCG/ACT inhaler; Inhale 2 puffs into the lungs every 6 (six) hours as needed for wheezing or shortness of breath.  Dispense: 8 g; Refill: 0 - predniSONE  (STERAPRED UNI-PAK 21 TAB) 10 MG (21) TBPK tablet; Use as directed  Dispense: 21 tablet; Refill: 0 -  promethazine -dextromethorphan (PROMETHAZINE -DM) 6.25-15 MG/5ML syrup; Take 5 mLs by mouth 3 (three) times daily as needed for cough.  Dispense: 118 mL; Refill: 0  - Take meds as prescribed - Use a cool mist humidifier  -Use saline nose sprays frequently -Force fluids -For any cough or congestion  Use plain Mucinex - regular strength or max strength is fine -For fever or aces or pains- take tylenol  or ibuprofen . -Throat lozenges if help -Follow  up if symptoms worsen or do not improve   Follow Up Instructions: I discussed the assessment and treatment plan with the patient. The patient was provided an opportunity to ask questions and all were answered. The patient agreed with the plan and demonstrated an understanding of the instructions.  A copy of instructions were sent to the patient via MyChart unless otherwise noted below.     The patient was advised to call back or seek an in-person evaluation if the symptoms worsen or if the condition fails to improve as anticipated.    Bari Learn, FNP

## 2024-12-02 DIAGNOSIS — F4001 Agoraphobia with panic disorder: Secondary | ICD-10-CM | POA: Diagnosis not present

## 2024-12-02 DIAGNOSIS — F339 Major depressive disorder, recurrent, unspecified: Secondary | ICD-10-CM | POA: Diagnosis not present

## 2024-12-02 DIAGNOSIS — G4700 Insomnia, unspecified: Secondary | ICD-10-CM | POA: Diagnosis not present

## 2024-12-06 ENCOUNTER — Other Ambulatory Visit: Payer: Self-pay | Admitting: Family

## 2024-12-06 DIAGNOSIS — J209 Acute bronchitis, unspecified: Secondary | ICD-10-CM

## 2024-12-11 ENCOUNTER — Other Ambulatory Visit: Payer: Self-pay | Admitting: Family Medicine

## 2024-12-11 DIAGNOSIS — I10 Essential (primary) hypertension: Secondary | ICD-10-CM

## 2024-12-17 ENCOUNTER — Ambulatory Visit: Admitting: Psychology

## 2024-12-28 ENCOUNTER — Encounter: Payer: Self-pay | Admitting: Family Medicine

## 2024-12-28 ENCOUNTER — Other Ambulatory Visit: Payer: Self-pay

## 2024-12-28 ENCOUNTER — Encounter (HOSPITAL_BASED_OUTPATIENT_CLINIC_OR_DEPARTMENT_OTHER): Payer: Self-pay

## 2024-12-28 ENCOUNTER — Emergency Department (HOSPITAL_BASED_OUTPATIENT_CLINIC_OR_DEPARTMENT_OTHER)
Admission: EM | Admit: 2024-12-28 | Discharge: 2024-12-28 | Disposition: A | Discharge status: Home / Self Care | Attending: Emergency Medicine | Admitting: Emergency Medicine

## 2024-12-28 DIAGNOSIS — R251 Tremor, unspecified: Secondary | ICD-10-CM | POA: Diagnosis present

## 2024-12-28 DIAGNOSIS — J209 Acute bronchitis, unspecified: Secondary | ICD-10-CM

## 2024-12-28 LAB — CBC WITH DIFFERENTIAL/PLATELET
Abs Immature Granulocytes: 0.01 K/uL (ref 0.00–0.07)
Basophils Absolute: 0 K/uL (ref 0.0–0.1)
Basophils Relative: 0 %
Eosinophils Absolute: 0 K/uL (ref 0.0–0.5)
Eosinophils Relative: 0 %
HCT: 42.1 % (ref 36.0–46.0)
Hemoglobin: 13.7 g/dL (ref 12.0–15.0)
Immature Granulocytes: 0 %
Lymphocytes Relative: 27 %
Lymphs Abs: 2.2 K/uL (ref 0.7–4.0)
MCH: 26.8 pg (ref 26.0–34.0)
MCHC: 32.5 g/dL (ref 30.0–36.0)
MCV: 82.4 fL (ref 80.0–100.0)
Monocytes Absolute: 0.9 K/uL (ref 0.1–1.0)
Monocytes Relative: 11 %
Neutro Abs: 5.1 K/uL (ref 1.7–7.7)
Neutrophils Relative %: 62 %
Platelets: 217 K/uL (ref 150–400)
RBC: 5.11 MIL/uL (ref 3.87–5.11)
RDW: 15.9 % — ABNORMAL HIGH (ref 11.5–15.5)
WBC: 8.2 K/uL (ref 4.0–10.5)
nRBC: 0 % (ref 0.0–0.2)

## 2024-12-28 LAB — COMPREHENSIVE METABOLIC PANEL WITH GFR
ALT: 17 U/L (ref 0–44)
AST: 23 U/L (ref 15–41)
Albumin: 4.1 g/dL (ref 3.5–5.0)
Alkaline Phosphatase: 134 U/L — ABNORMAL HIGH (ref 38–126)
Anion gap: 11 (ref 5–15)
BUN: 13 mg/dL (ref 6–20)
CO2: 25 mmol/L (ref 22–32)
Calcium: 9.2 mg/dL (ref 8.9–10.3)
Chloride: 101 mmol/L (ref 98–111)
Creatinine, Ser: 0.76 mg/dL (ref 0.44–1.00)
GFR, Estimated: >60 mL/min
Glucose, Bld: 108 mg/dL — ABNORMAL HIGH (ref 70–99)
Potassium: 4.3 mmol/L (ref 3.5–5.1)
Sodium: 137 mmol/L (ref 135–145)
Total Bilirubin: 0.3 mg/dL (ref 0.0–1.2)
Total Protein: 6.9 g/dL (ref 6.5–8.1)

## 2024-12-28 LAB — LIPASE, BLOOD: Lipase: 14 U/L (ref 11–51)

## 2024-12-28 MED ORDER — SODIUM CHLORIDE 0.9 % IV BOLUS
500.0000 mL | Freq: Once | INTRAVENOUS | Status: AC
  Administered 2024-12-28: 500 mL via INTRAVENOUS

## 2024-12-28 MED ORDER — LORAZEPAM 2 MG/ML IJ SOLN
2.0000 mg | Freq: Once | INTRAMUSCULAR | Status: AC
  Administered 2024-12-28: 2 mg via INTRAVENOUS
  Filled 2024-12-28: qty 1

## 2024-12-28 NOTE — ED Triage Notes (Signed)
 Pt.reports drinking coffee this morning and started shaking. Has stimulator placed in back for trigeminal neuralgia. Stimulator is not on at this moment. Pt. Is restless/shaking and tearful during triage. Hx Right side face paralysis.

## 2024-12-28 NOTE — Discharge Instructions (Signed)
 Continue your home medications and follow-up closely with your primary care neurosurgery doctors.

## 2024-12-28 NOTE — ED Provider Notes (Signed)
 "  Emergency Department Provider Note   I have reviewed the triage vital signs and the nursing notes.   HISTORY  Chief Complaint Anxiety   HPI Desiree Wilson is a 58 y.o. female past history of trigeminal neuralgia with cervical spine stimulator placed at [facility], anxiety, chronic pain presents to the emergency department with acute onset shaking.  Symptoms began this morning while drinking her morning coffee.  She feels like she is having total body shaking. No severe pain. No numbness. She takes Klonopin  which she last took yesterday evening.  She also administers intranasal ketamine at home for pain symptoms but has not taken that.  She does wear buprenorphine patch. No CP or SOB symptoms.    Past Medical History:  Diagnosis Date   Allergy    rhinitis   Anemia    Anxiety    Blood transfusion    x2 1983 surgery blood loss   Blood transfusion without reported diagnosis    Depression    Depression    Facial paralysis    right face/ chronic pain syndrome (pain management Dr Debbie)   Yzjijryz(215.9)    Hyperlipidemia    Hypotension    Iron deficiency anemia 11/15/2014   Iron malabsorption 11/15/2014   Osteoarthritis    Trigeminal neuralgia    right   Trigeminal neuralgia 2002    Review of Systems  Constitutional: No fever/chills. Positive total body shaking.  Cardiovascular: Denies chest pain. Respiratory: Denies shortness of breath. Gastrointestinal: No abdominal pain.  No nausea, no vomiting. Skin: Negative for rash. Neurological: No HA.   ____________________________________________   PHYSICAL EXAM:  VITAL SIGNS: ED Triage Vitals  Encounter Vitals Group     BP 12/28/24 0600 (!) 148/110     Pulse Rate 12/28/24 0600 92     SpO2 12/28/24 0600 97 %   Constitutional: Alert with total body shaking and moaning. She is re-directable and able to focus and provide a history.  Eyes: Conjunctivae are normal. PERRL.  Head: Atraumatic. Nose: No  congestion/rhinnorhea. Mouth/Throat: Mucous membranes are moist.  Neck: No stridor.   Cardiovascular: Normal rate, regular rhythm. Good peripheral circulation. Grossly normal heart sounds.   Respiratory: Normal respiratory effort.  No retractions. Lungs CTAB. Gastrointestinal: Soft and nontender. No distention.  Musculoskeletal: No gross deformities of extremities. Neurologic:  Normal speech and language. Skin:  Skin is warm, dry and intact. No rash noted.   ____________________________________________   LABS (all labs ordered are listed, but only abnormal results are displayed)  Labs Reviewed  COMPREHENSIVE METABOLIC PANEL WITH GFR - Abnormal; Notable for the following components:      Result Value   Glucose, Bld 108 (*)    Alkaline Phosphatase 134 (*)    All other components within normal limits  CBC WITH DIFFERENTIAL/PLATELET - Abnormal; Notable for the following components:   RDW 15.9 (*)    All other components within normal limits  LIPASE, BLOOD   ____________________________________________  EKG   EKG Interpretation Date/Time:  Monday December 28 2024 06:36:08 EST Ventricular Rate:  69 PR Interval:  170 QRS Duration:  84 QT Interval:  398 QTC Calculation: 427 R Axis:   70  Text Interpretation: Sinus rhythm Low voltage, precordial leads Confirmed by Darra Chew [phone]) on 12/28/2024 6:38:04 AM        ____________________________________________   PROCEDURES  Procedure(s) performed:   Procedures  None  ____________________________________________   INITIAL IMPRESSION / ASSESSMENT AND PLAN / ED COURSE  Pertinent labs & imaging results that  were available during my care of the patient were reviewed by me and considered in my medical decision making (see chart for details).   This patient is Presenting for Evaluation of shaking episodes, which does require a range of treatment options, and is a complaint that involves a high risk of morbidity and  mortality.  The Differential Diagnoses include anxiety, chronic pain, AKI, dehydration, tremor, seizure, etc.  Critical Interventions-    Medications  LORazepam  (ATIVAN ) injection 2 mg (2 mg Intravenous Given 12/28/24 0618)  sodium chloride  0.9 % bolus 500 mL (500 mLs Intravenous New Bag/Given 12/28/24 0618)    Reassessment after intervention:  patient is relaxed and feeling much better.    I did obtain Additional Historical Information from husband at bedside.   I decided to review pertinent External Data, and in summary followed by NSG at [facility] for cervical spine stimulator.   Clinical Laboratory Tests Ordered, included CBC without leukocytosis or anemia. LFTs normal. No AKI.   Cardiac Monitor Tracing which shows NSR.    Social Determinants of Health Risk patient has a smoking history.   Medical Decision Making: Summary:  The patient presents emergency department with acute onset symptoms of shaking.  Self describes this is feeling anxiety and out-of-control.  On exam her presentation is not consistent with generalized or partial seizure.  No intention tremor.  She has a patent airway and able to provide history.  She is redirectable.  Plan for screening blood work and Ativan  to start with reassessment.  Reevaluation with update and discussion with patient.  She is now much more comfortable appearing.  She is awake and alert.  No focal neurodeficits.  Labs are reassuring.  Symptoms improving with IV fluids.  Patient with episodes like this in the past.  Suspect that this is chronic in nature.  Do not see a clear indication for emergent neuroimaging or other workup.  She has medications at home to help with her anxiety and pain.  Stable for discharge.  Husband will drive her home.   Patient's presentation is most consistent with acute presentation with potential threat to life or bodily function.   Disposition: discharge  ____________________________________________  FINAL  CLINICAL IMPRESSION(S) / ED DIAGNOSES  Final diagnoses:  Episode of shaking    Note:  This document was prepared using Dragon voice recognition software and may include unintentional dictation errors.  Fonda Law, MD, [facility] Emergency Medicine    Cinde Ebert, Fonda MATSU, MD 12/28/24 [phone]  "

## 2024-12-29 MED ORDER — OPTICHAMBER DIAMOND-SM MASK MISC: 1 refills | Status: AC

## 2024-12-29 NOTE — Telephone Encounter (Signed)
 Ok to send over rx for pediatric inahler mask.   Also is she seeing nayone for her anxiety ?

## 2024-12-29 NOTE — Telephone Encounter (Signed)
 Meds ordered this encounter  Medications   Spacer/Aero-Holding Chambers (OPTICHAMBER DIAMOND-SM MASK) MISC    Sig: Use as directed. Pediatric version or similar mask    Dispense:  1 each    Refill:  1

## 2025-01-18 ENCOUNTER — Other Ambulatory Visit: Payer: Self-pay | Admitting: Family Medicine

## 2025-01-18 DIAGNOSIS — G894 Chronic pain syndrome: Secondary | ICD-10-CM

## 2025-01-19 ENCOUNTER — Encounter: Payer: Self-pay | Admitting: Family Medicine

## 2025-01-19 ENCOUNTER — Other Ambulatory Visit: Payer: Self-pay

## 2025-01-19 ENCOUNTER — Ambulatory Visit: Admitting: Psychology

## 2025-01-19 DIAGNOSIS — F331 Major depressive disorder, recurrent, moderate: Secondary | ICD-10-CM

## 2025-01-19 DIAGNOSIS — F411 Generalized anxiety disorder: Secondary | ICD-10-CM

## 2025-01-19 NOTE — Telephone Encounter (Signed)
 PREGABALIN  Last OV: 10/06/24 Next OV: 01/21/25 Last RF: 07/07/24

## 2025-01-19 NOTE — Progress Notes (Signed)
 "  [name] Counselor/Therapist Progress Note  Patient ID: Desiree Wilson, MRN: [id number],    Date: 01/19/2025  Time Spent: 9:00am-9:45am   45 minutes   Treatment Type: Individual Therapy  Reported Symptoms: stress, worry  Mental Status Exam: Appearance:  Casual     Behavior: Appropriate  Motor: Normal  Speech/Language:  Normal Rate  Affect: Appropriate  Mood: normal  Thought process: normal  Thought content:   WNL  Sensory/Perceptual disturbances:   WNL  Orientation: oriented to person, place, time/date, and situation  Attention: Good  Concentration: Good  Memory: WNL  Fund of knowledge:  Good  Insight:   Good  Judgment:  Good  Impulse Control: Good   Risk Assessment: Danger to Self:  No Self-injurious Behavior: No Danger to Others: No Duty to Warn:no Physical Aggression / Violence:No  Access to Firearms a concern: No  Gang Involvement:No   Subjective: Pt present for face-to-face individual therapy via video.  Pt consents to telehealth video session and is aware of limitations and benefits of virtual sessions.  Location of pt: home Location of therapist: home office.   Pt talked about her pain stimulator.  It has not been working well so she has been in pain.   Pt also ran out of her Lyrica  and has had trouble getting a refill.  She has been without the medication for 4 days and is in a lot of nerve pain.    Pt states December was a difficult month.  Her mother in law stayed with them and was very difficult to deal with.   Pt's husband Oneil has been drinking more which is upsetting to pt.   Pt was so stressed and anxious that she went to the ER with a panic attack.   Worked with pt on stress management and calming strategies. Pt's job gave her feedback that she could lose her job if she does not work more.   Pt is salary but often does not work bc of pain.  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   Pt is  already worried about finances so she does not need to lose her job. Worked on self care strategies. Provided supportive therapy.  Interventions: Cognitive Behavioral Therapy and Insight-Oriented  Diagnosis:  F33.1 and F41.1    Plan of Care: Recommend ongoing therapy.   Pt participated with setting treatment goals.   Pt states her goals for therapy are to have a place to process her feelings.   Pt wants to improve coping skills.  Plan to meet every month.  Pt agrees with treatment plan.  Treatment Plan (Treatment Plan Target Date: 04/16/2025) Client Abilities/Strengths  Pt is bright, engaging, and motivated for therapy.  Client Treatment Preferences  Individual therapy.  Client Statement of Needs  Improve copings skills and have a safe place to talk about feelings.   Symptoms  Depressed or irritable mood. Excessive and/or unrealistic worry that is difficult to control occurring more days than not for at least 6 months about a number of events or activities. Hypervigilance (e.g., feeling constantly on edge, experiencing concentration difficulties, having trouble falling or staying asleep, exhibiting a general state of irritability).  Problems Addressed  Unipolar Depression, Anxiety Goals 1. Alleviate depressive symptoms and return to previous level of effective functioning. 2. Appropriately grieve the loss in order to normalize mood and to return to previously adaptive level of functioning. Objective Learn and implement behavioral strategies to  overcome depression. Target Date: 2025-04-16 Frequency: Monthly  Progress: 45 Modality: individual  Related Interventions Assist the client in developing skills that increase the likelihood of deriving pleasure from behavioral activation (e.g., assertiveness skills, developing an exercise plan, less internal/more external focus, increased social involvement); reinforce success. Engage the client in behavioral activation, increasing his/her  activity level and contact with sources of reward, while identifying processes that inhibit activation. use behavioral techniques such as instruction, rehearsal, role-playing, role reversal, as needed, to facilitate activity in the client's daily life; reinforce success. 3. Develop healthy interpersonal relationships that lead to the alleviation and help prevent the relapse of depression. 4. Develop healthy thinking patterns and beliefs about self, others, and the world that lead to the alleviation and help prevent the relapse of depression. 5. Enhance ability to effectively cope with the full variety of life's worries and anxieties. 6. Learn and implement coping skills that result in a reduction of anxiety and worry, and improved daily functioning. Objective Learn and implement problem-solving strategies for realistically addressing worries. Target Date: 2025-04-16 Frequency: Monthly  Progress: 45 Modality: individual  Related Interventions Assign the client a homework exercise in which he/she problem-solves a current problem (see Mastery of Your Anxiety and Worry: Workbook by Richarda and Jonne or Generalized Anxiety Disorder by Delores Filler, and Jonne); review, reinforce success, and provide corrective feedback toward improvement. Teach the client problem-solving strategies involving specifically defining a problem, generating options for addressing it, evaluating the pros and cons of each option, selecting and implementing an optional action, and reevaluating and refining the action. Objective Learn and implement calming skills to reduce overall anxiety and manage anxiety symptoms. Target Date: 2025-04-16 Frequency: Monthly  Progress: 45 Modality: individual  Related Interventions Assign the client to read about progressive muscle relaxation and other calming strategies in relevant books or treatment manuals (e.g., Progressive Relaxation Training by Thornell armin Collier; Mastery of Your  Anxiety and Worry: Workbook by Richarda armin Jonne). Assign the client homework each session in which he/she practices relaxation exercises daily, gradually applying them progressively from non-anxiety-provoking to anxiety-provoking situations; review and reinforce success while providing corrective feedback toward improvement. Teach the client calming/relaxation skills (e.g., applied relaxation, progressive muscle relaxation, cue controlled relaxation; mindful breathing; biofeedback) and how to discriminate better between relaxation and tension; teach the client how to apply these skills to his/her daily life. 7. Recognize, accept, and cope with feelings of depression. 8. Reduce overall frequency, intensity, and duration of the anxiety so that daily functioning is not impaired. 9. Resolve the core conflict that is the source of anxiety. 10. Stabilize anxiety level while increasing ability to function on a daily basis. Diagnosis Axis none F33.1 Major Depressive Disorder  Axis none F41.1 General Anxiety Disorder  Conditions For Discharge Achievement of treatment goals and objectives   Veva Alma, LCSW   "

## 2025-01-20 NOTE — Telephone Encounter (Signed)
 done

## 2025-01-21 ENCOUNTER — Encounter: Payer: Self-pay | Admitting: Family Medicine

## 2025-01-21 ENCOUNTER — Ambulatory Visit: Admitting: Family Medicine

## 2025-01-21 VITALS — BP 122/79 | HR 78 | Ht 65.0 in | Wt 213.0 lb

## 2025-01-21 DIAGNOSIS — N182 Chronic kidney disease, stage 2 (mild): Secondary | ICD-10-CM

## 2025-01-21 DIAGNOSIS — I10 Essential (primary) hypertension: Secondary | ICD-10-CM | POA: Diagnosis not present

## 2025-01-21 DIAGNOSIS — R7301 Impaired fasting glucose: Secondary | ICD-10-CM

## 2025-01-21 DIAGNOSIS — F331 Major depressive disorder, recurrent, moderate: Secondary | ICD-10-CM | POA: Diagnosis not present

## 2025-01-21 LAB — POCT GLYCOSYLATED HEMOGLOBIN (HGB A1C): Hemoglobin A1C: 5.8 % — AB (ref 4.0–5.6)

## 2025-01-21 MED ORDER — AMLODIPINE BESYLATE 10 MG PO TABS: 10.0000 mg | ORAL_TABLET | Freq: Every day | ORAL | 3 refills | Status: AC

## 2025-01-21 NOTE — Assessment & Plan Note (Signed)
 Primary hypertension Blood pressure well-controlled with current medication. - Continue current antihypertensive regimen. - Checked for medication refill.

## 2025-01-21 NOTE — Assessment & Plan Note (Signed)
 BMI 35 with hypertension and prediabetes as complications. Weight management prioritized. Discussed benefits of weight loss on joint health and well-being. - Encouraged participation in [place] activities. - Advised on dietary changes to support weight los

## 2025-01-21 NOTE — Assessment & Plan Note (Signed)
 Renal function has been fairly stable which is great but we will continue to monitor carefully.

## 2025-01-21 NOTE — Assessment & Plan Note (Signed)
 Currently stable on current regimen.  Really getting her pain under control with her recent stimulator has made a huge difference in her mood.  Still struggles with some component some anxiety at times.  Engaging in breathing exercises and visual imagery for anxiety management. - Continue breathing exercises and visual imagery techniques for anxiety management.  Continues Cymbalta  twice daily. Engaging in breathing exercises for anxiety. - Continue Cymbalta  twice daily. - Encouraged continuation of breathing exercises for anxiety management.

## 2025-01-21 NOTE — Assessment & Plan Note (Signed)
 Impaired fasting glucose A1c 5.8, stable prediabetes. Encouraged weight management and increased physical activity. - Encouraged weight loss and increased physical activity. - Advised on healthy eating habits, focusing on vegetables and lean proteins.

## 2025-01-21 NOTE — Progress Notes (Signed)
 "  Established Patient Office Visit  Patient ID: Desiree Wilson, female    DOB: Apr 09, 1966  Age: 59 y.o. MRN: [id number] PCP: Alvan Dorothyann BIRCH, MD  Chief Complaint  Patient presents with   Hypertension   Chronic Kidney Disease   ifg    Subjective:     HPI  Discussed the use of AI scribe software for clinical note transcription with the patient, who gave verbal consent to proceed.  History of Present Illness Desiree Wilson is a 59 year old female who presents for a routine follow-up visit.  Hypertension and cardiovascular symptoms - Blood pressure is well controlled. - No chest pain except for a transient cramp on the left side of the chest this morning, described as feeling like a gas bubble, which resolved quickly. - Requests refill for blood pressure medication.  Chronic pain management - Uses Butrans patch for pain management, currently on 20 mcg. - Continues iron tablets twice daily. - No recent need for ketamine infusions or nerve blocks.  Prediabetes - Prediabetes is stable with a recent A1c of 5.8. - Decreased appetite. - Has joined the [facility] to increase physical activity. - Focusing on healthy eating, including preparing meals with chicken and vegetables.  Anxiety - Experiences anxiety. - Practices breathing exercises for management. - Takes half a pill for anxiety as needed, usually at night or after stressful days.  Current medications - Cymbalta  twice daily. - Lyrica  with five refills remaining. - Anti-acid medication with one refill remaining. - Cholesterol medication.     ROS    Objective:     BP 122/79   Pulse 78   Ht 5' 5 (1.651 m)   Wt 213 lb (96.6 kg)   LMP 05/04/2020   SpO2 97%   BMI 35.45 kg/m    Physical Exam Vitals and nursing note reviewed.  Constitutional:      Appearance: Normal appearance.  HENT:     Head: Normocephalic and atraumatic.  Eyes:     Conjunctiva/sclera: Conjunctivae normal.  Cardiovascular:      Rate and Rhythm: Normal rate and regular rhythm.  Pulmonary:     Effort: Pulmonary effort is normal.     Breath sounds: Normal breath sounds.  Skin:    General: Skin is warm and dry.  Neurological:     Mental Status: She is alert.  Psychiatric:        Mood and Affect: Mood normal.      No results found for any visits on 01/21/25.    The 10-year ASCVD risk score (Arnett DK, et al., 2019) is: 6.4%    Assessment & Plan:   Problem List Items Addressed This Visit       Cardiovascular and Mediastinum   Primary hypertension   Primary hypertension Blood pressure well-controlled with current medication. - Continue current antihypertensive regimen. - Checked for medication refill.      Relevant Medications   amLODipine  (NORVASC ) 10 MG tablet     Endocrine   IFG (impaired fasting glucose) - Primary   Impaired fasting glucose A1c 5.8, stable prediabetes. Encouraged weight management and increased physical activity. - Encouraged weight loss and increased physical activity. - Advised on healthy eating habits, focusing on vegetables and lean proteins.      Relevant Orders   POCT HgB A1C     Genitourinary   CKD (chronic kidney disease) stage 2, GFR 60-89 ml/min   Renal function has been fairly stable which is great but we will continue to monitor  carefully.        Other   Morbid obesity (HCC)   BMI 35 with hypertension and prediabetes as complications. Weight management prioritized. Discussed benefits of weight loss on joint health and well-being. - Encouraged participation in [employer] activities. - Advised on dietary changes to support weight los      Moderate episode of recurrent major depressive disorder (HCC)   Currently stable on current regimen.  Really getting her pain under control with her recent stimulator has made a huge difference in her mood.  Still struggles with some component some anxiety at times.  Engaging in breathing exercises and visual imagery for  anxiety management. - Continue breathing exercises and visual imagery techniques for anxiety management.  Continues Cymbalta  twice daily. Engaging in breathing exercises for anxiety. - Continue Cymbalta  twice daily. - Encouraged continuation of breathing exercises for anxiety management.         Assessment and Plan Assessment & Plan    Major depressive disorder, recurrent, moderate     Return in about 6 months (around 07/21/2025) for Hypertension, Pre-diabetes.    Dorothyann Byars, MD [facility] at [facility]   "

## 2025-02-03 ENCOUNTER — Other Ambulatory Visit: Payer: Self-pay | Admitting: *Deleted

## 2025-02-03 MED ORDER — FLUTICASONE PROPIONATE 50 MCG/ACT NA SUSP: 1.0000 | Freq: Every day | NASAL | 1 refills | Status: AC

## 2025-02-18 ENCOUNTER — Ambulatory Visit: Admitting: Psychology

## 2025-03-16 ENCOUNTER — Ambulatory Visit: Admitting: Psychology

## 2025-07-21 ENCOUNTER — Ambulatory Visit: Admitting: Family Medicine
